# Patient Record
Sex: Male | Born: 1965 | Race: White | Hispanic: No | Marital: Single | Smoking: Never smoker
Health system: Southern US, Community
[De-identification: ages and names within clinical notes are randomized; demographics above are authoritative.]

## PROBLEM LIST (undated history)

## (undated) DIAGNOSIS — S272XXA Traumatic hemopneumothorax, initial encounter: Secondary | ICD-10-CM

## (undated) DIAGNOSIS — E119 Type 2 diabetes mellitus without complications: Secondary | ICD-10-CM

## (undated) DIAGNOSIS — J939 Pneumothorax, unspecified: Secondary | ICD-10-CM

## (undated) DIAGNOSIS — I10 Essential (primary) hypertension: Secondary | ICD-10-CM

## (undated) DIAGNOSIS — J9600 Acute respiratory failure, unspecified whether with hypoxia or hypercapnia: Secondary | ICD-10-CM

## (undated) DIAGNOSIS — W208XXA Other cause of strike by thrown, projected or falling object, initial encounter: Secondary | ICD-10-CM

## (undated) DIAGNOSIS — I4892 Unspecified atrial flutter: Secondary | ICD-10-CM

## (undated) DIAGNOSIS — M961 Postlaminectomy syndrome, not elsewhere classified: Secondary | ICD-10-CM

## (undated) DIAGNOSIS — R011 Cardiac murmur, unspecified: Secondary | ICD-10-CM

## (undated) DIAGNOSIS — J45909 Unspecified asthma, uncomplicated: Secondary | ICD-10-CM

## (undated) DIAGNOSIS — K922 Gastrointestinal hemorrhage, unspecified: Secondary | ICD-10-CM

## (undated) DIAGNOSIS — M109 Gout, unspecified: Secondary | ICD-10-CM

## (undated) DIAGNOSIS — D696 Thrombocytopenia, unspecified: Secondary | ICD-10-CM

## (undated) DIAGNOSIS — G8921 Chronic pain due to trauma: Secondary | ICD-10-CM

## (undated) DIAGNOSIS — Z86718 Personal history of other venous thrombosis and embolism: Secondary | ICD-10-CM

## (undated) DIAGNOSIS — M1990 Unspecified osteoarthritis, unspecified site: Secondary | ICD-10-CM

## (undated) DIAGNOSIS — D62 Acute posthemorrhagic anemia: Secondary | ICD-10-CM

## (undated) DIAGNOSIS — S2241XA Multiple fractures of ribs, right side, initial encounter for closed fracture: Secondary | ICD-10-CM

## (undated) DIAGNOSIS — S2239XA Fracture of one rib, unspecified side, initial encounter for closed fracture: Secondary | ICD-10-CM

## (undated) HISTORY — PX: NOSE SURGERY: SHX723

## (undated) HISTORY — PX: BACK SURGERY: SHX140

## (undated) HISTORY — DX: Gout, unspecified: M10.9

## (undated) HISTORY — DX: Unspecified atrial flutter: I48.92

---

## 1998-02-08 ENCOUNTER — Ambulatory Visit (HOSPITAL_COMMUNITY): Admission: RE | Admit: 1998-02-08 | Discharge: 1998-02-08 | Payer: Self-pay | Admitting: *Deleted

## 1998-02-22 ENCOUNTER — Ambulatory Visit (HOSPITAL_COMMUNITY): Admission: RE | Admit: 1998-02-22 | Discharge: 1998-02-22 | Payer: Self-pay | Admitting: *Deleted

## 1998-02-22 ENCOUNTER — Encounter: Admission: RE | Admit: 1998-02-22 | Discharge: 1998-02-22 | Payer: Self-pay | Admitting: Hematology and Oncology

## 1998-03-17 ENCOUNTER — Encounter: Admission: RE | Admit: 1998-03-17 | Discharge: 1998-03-17 | Payer: Self-pay | Admitting: Internal Medicine

## 1998-05-08 ENCOUNTER — Encounter: Payer: Self-pay | Admitting: *Deleted

## 1998-05-08 ENCOUNTER — Ambulatory Visit (HOSPITAL_COMMUNITY): Admission: RE | Admit: 1998-05-08 | Discharge: 1998-05-08 | Payer: Self-pay | Admitting: *Deleted

## 1998-05-22 ENCOUNTER — Ambulatory Visit (HOSPITAL_COMMUNITY): Admission: RE | Admit: 1998-05-22 | Discharge: 1998-05-22 | Payer: Self-pay | Admitting: *Deleted

## 1998-06-28 ENCOUNTER — Encounter: Admission: RE | Admit: 1998-06-28 | Discharge: 1998-06-28 | Payer: Self-pay | Admitting: Internal Medicine

## 2000-12-18 ENCOUNTER — Emergency Department (HOSPITAL_COMMUNITY): Admission: EM | Admit: 2000-12-18 | Discharge: 2000-12-18 | Payer: Self-pay | Admitting: Emergency Medicine

## 2000-12-18 ENCOUNTER — Encounter: Payer: Self-pay | Admitting: Emergency Medicine

## 2001-12-08 ENCOUNTER — Emergency Department (HOSPITAL_COMMUNITY): Admission: EM | Admit: 2001-12-08 | Discharge: 2001-12-08 | Payer: Self-pay | Admitting: Emergency Medicine

## 2002-02-01 ENCOUNTER — Emergency Department (HOSPITAL_COMMUNITY): Admission: EM | Admit: 2002-02-01 | Discharge: 2002-02-02 | Payer: Self-pay | Admitting: *Deleted

## 2003-06-12 ENCOUNTER — Emergency Department (HOSPITAL_COMMUNITY): Admission: EM | Admit: 2003-06-12 | Discharge: 2003-06-12 | Payer: Self-pay | Admitting: Emergency Medicine

## 2003-06-13 ENCOUNTER — Emergency Department (HOSPITAL_COMMUNITY): Admission: EM | Admit: 2003-06-13 | Discharge: 2003-06-13 | Payer: Self-pay | Admitting: Emergency Medicine

## 2004-10-10 ENCOUNTER — Emergency Department (HOSPITAL_COMMUNITY): Admission: EM | Admit: 2004-10-10 | Discharge: 2004-10-10 | Payer: Self-pay | Admitting: *Deleted

## 2006-09-09 ENCOUNTER — Emergency Department (HOSPITAL_COMMUNITY): Admission: AC | Admit: 2006-09-09 | Discharge: 2006-09-09 | Payer: Self-pay

## 2006-09-11 ENCOUNTER — Emergency Department (HOSPITAL_COMMUNITY): Admission: EM | Admit: 2006-09-11 | Discharge: 2006-09-11 | Payer: Self-pay | Admitting: Emergency Medicine

## 2006-09-17 ENCOUNTER — Ambulatory Visit: Payer: Self-pay | Admitting: Orthopedic Surgery

## 2006-10-07 ENCOUNTER — Emergency Department (HOSPITAL_COMMUNITY): Admission: EM | Admit: 2006-10-07 | Discharge: 2006-10-07 | Payer: Self-pay | Admitting: Emergency Medicine

## 2006-12-01 ENCOUNTER — Ambulatory Visit: Payer: Self-pay | Admitting: Orthopedic Surgery

## 2007-01-31 ENCOUNTER — Emergency Department (HOSPITAL_COMMUNITY): Admission: EM | Admit: 2007-01-31 | Discharge: 2007-01-31 | Payer: Self-pay | Admitting: Emergency Medicine

## 2007-02-02 ENCOUNTER — Emergency Department (HOSPITAL_COMMUNITY): Admission: EM | Admit: 2007-02-02 | Discharge: 2007-02-02 | Payer: Self-pay | Admitting: Emergency Medicine

## 2007-03-22 ENCOUNTER — Emergency Department (HOSPITAL_COMMUNITY): Admission: EM | Admit: 2007-03-22 | Discharge: 2007-03-22 | Payer: Self-pay | Admitting: Emergency Medicine

## 2007-08-13 HISTORY — PX: ESOPHAGOGASTRODUODENOSCOPY: SHX1529

## 2008-01-27 ENCOUNTER — Ambulatory Visit (HOSPITAL_COMMUNITY): Admission: RE | Admit: 2008-01-27 | Discharge: 2008-01-27 | Payer: Self-pay | Admitting: Internal Medicine

## 2008-04-27 ENCOUNTER — Ambulatory Visit: Payer: Self-pay | Admitting: Internal Medicine

## 2008-05-04 ENCOUNTER — Ambulatory Visit (HOSPITAL_COMMUNITY): Admission: RE | Admit: 2008-05-04 | Discharge: 2008-05-04 | Payer: Self-pay | Admitting: Internal Medicine

## 2008-05-04 ENCOUNTER — Ambulatory Visit: Payer: Self-pay | Admitting: Internal Medicine

## 2009-11-06 ENCOUNTER — Inpatient Hospital Stay (HOSPITAL_COMMUNITY): Admission: EM | Admit: 2009-11-06 | Discharge: 2009-11-09 | Payer: Self-pay | Admitting: Emergency Medicine

## 2009-11-07 ENCOUNTER — Encounter (INDEPENDENT_AMBULATORY_CARE_PROVIDER_SITE_OTHER): Payer: Self-pay | Admitting: Cardiology

## 2010-11-04 LAB — GLUCOSE, CAPILLARY
Glucose-Capillary: 125 mg/dL — ABNORMAL HIGH (ref 70–99)
Glucose-Capillary: 138 mg/dL — ABNORMAL HIGH (ref 70–99)
Glucose-Capillary: 160 mg/dL — ABNORMAL HIGH (ref 70–99)
Glucose-Capillary: 236 mg/dL — ABNORMAL HIGH (ref 70–99)
Glucose-Capillary: 237 mg/dL — ABNORMAL HIGH (ref 70–99)
Glucose-Capillary: 263 mg/dL — ABNORMAL HIGH (ref 70–99)

## 2010-11-04 LAB — CARDIAC PANEL(CRET KIN+CKTOT+MB+TROPI)
CK, MB: 2 ng/mL (ref 0.3–4.0)
CK, MB: 2.3 ng/mL (ref 0.3–4.0)
Relative Index: INVALID (ref 0.0–2.5)
Total CK: 67 U/L (ref 7–232)

## 2010-11-04 LAB — CBC
HCT: 36.7 % — ABNORMAL LOW (ref 39.0–52.0)
HCT: 42.2 % (ref 39.0–52.0)
Hemoglobin: 14.9 g/dL (ref 13.0–17.0)
MCHC: 36 g/dL (ref 30.0–36.0)
MCV: 86.2 fL (ref 78.0–100.0)
Platelets: 183 10*3/uL (ref 150–400)
RBC: 4.89 MIL/uL (ref 4.22–5.81)
RDW: 12.6 % (ref 11.5–15.5)
RDW: 12.9 % (ref 11.5–15.5)
WBC: 10.1 10*3/uL (ref 4.0–10.5)

## 2010-11-04 LAB — BASIC METABOLIC PANEL
BUN: 15 mg/dL (ref 6–23)
BUN: 17 mg/dL (ref 6–23)
CO2: 26 mEq/L (ref 19–32)
Calcium: 9.3 mg/dL (ref 8.4–10.5)
GFR calc non Af Amer: >60 mL/min (ref >60)
GFR calc non Af Amer: >60 mL/min (ref >60)
Potassium: 3.7 mEq/L (ref 3.5–5.1)

## 2010-11-04 LAB — TSH: TSH: 2.955 u[IU]/mL (ref 0.350–4.500)

## 2010-11-04 LAB — HEPATIC FUNCTION PANEL
ALT: 20 U/L (ref 0–53)
AST: 19 U/L (ref 0–37)
Albumin: 3.6 g/dL (ref 3.5–5.2)
Bilirubin, Direct: 0.1 mg/dL (ref 0.0–0.3)
Indirect Bilirubin: 0.4 mg/dL (ref 0.3–0.9)
Total Bilirubin: 0.5 mg/dL (ref 0.3–1.2)

## 2010-11-04 LAB — DIFFERENTIAL
Basophils Absolute: 0 10*3/uL (ref 0.0–0.1)
Basophils Relative: 0 % (ref 0–1)
Basophils Relative: 0 % (ref 0–1)
Eosinophils Absolute: 0.1 10*3/uL (ref 0.0–0.7)
Eosinophils Absolute: 0.1 10*3/uL (ref 0.0–0.7)
Lymphocytes Relative: 28 % (ref 12–46)
Lymphocytes Relative: 29 % (ref 12–46)
Lymphs Abs: 2.4 10*3/uL (ref 0.7–4.0)
Monocytes Relative: 9 % (ref 3–12)
Neutro Abs: 5.4 10*3/uL (ref 1.7–7.7)
Neutro Abs: 6.3 10*3/uL (ref 1.7–7.7)

## 2010-11-04 LAB — POCT CARDIAC MARKERS
CKMB, poc: <1 ng/mL — ABNORMAL LOW (ref 1.0–8.0)
Myoglobin, poc: 56.1 ng/mL (ref 12–200)

## 2010-11-04 LAB — HEMOGLOBIN A1C
Hgb A1c MFr Bld: 10.1 % — ABNORMAL HIGH (ref 4.6–6.1)
Mean Plasma Glucose: 243 mg/dL

## 2010-11-04 LAB — PROTIME-INR
INR: 0.97 (ref 0.00–1.49)
Prothrombin Time: 12.8 seconds (ref 11.6–15.2)

## 2010-11-04 LAB — CK TOTAL AND CKMB (NOT AT ARMC): CK, MB: 2.5 ng/mL (ref 0.3–4.0)

## 2010-11-04 LAB — APTT: aPTT: 25 seconds (ref 24–37)

## 2010-11-04 LAB — LIPID PANEL: Triglycerides: 155 mg/dL — ABNORMAL HIGH (ref <150)

## 2010-11-04 LAB — TROPONIN I: Troponin I: <0.01 ng/mL (ref 0.00–0.06)

## 2010-12-25 NOTE — Op Note (Signed)
NAMEPERRIS, CONWELL               ACCOUNT NO.:  [id number]   MEDICAL RECORD NO.:  [id number]          PATIENT TYPE:  AMB   LOCATION:  DAY                           FACILITY:  [facility]   PHYSICIAN:  R. Roetta Sessions, M.D. DATE OF BIRTH:  June 29, 1966   DATE OF PROCEDURE:  05/04/2008  DATE OF DISCHARGE:                               OPERATIVE REPORT   INDICATIONS FOR PROCEDURE:  A 45 year old gentleman with 2 episodes of  hematochezia, went back now three months ago.  He has been having some  right upper quadrant abdominal pain.  He is well.  He has not had any  melena or hematochezia.  He has not had any hematemesis since June 2009.  EGD is now being done.  Risks, benefits, and alternatives have been  reviewed and questions were answered.  He is agreeable.  Please see the  documentation in the medical record.   PROCEDURE NOTE:  O2 saturation, blood pressure, and pulse of the patient  monitored throughout the entire procedure.  Conscious sedation, Versed 5  mg IV and Demerol 100 g IV in divided doses, and Cetacaine spray.   INSTRUMENT:  Pentax video chip system.   FINDINGS:  Examination of the tubular esophagus revealed no mucosal  abnormalities.  EG junction easily traversed and into his stomach.  Gastric cavity was emptied, and insufflated well with air.  Thorough  examination of the gastric mucosa including retroflexed proximal  stomach, esophagogastric junction demonstrated only couple of tiny  prepyloric antral erosions.  The remainder of gastric mucosa appeared  unremarkable.  Pylorus was patent and easily traversed.  Examination of  the bulb and second portion revealed no abnormalities.  Therapeutic/diagnostic maneuvers performed, none.   The patient tolerated the procedure well and was reacted in Endoscopy.   IMPRESSION:  Normal esophagus and a couple of tiny antral erosions of  doubtful clinical significance, otherwise, normal gastric mucosa.  Normal D1 and D2.   RECOMMENDATIONS:  To follow up with the physicians at the [facility].  Should Mr. Hinderman have any future GI complaints, we will be glad to see  him.      Jonathon Bellows, M.D.  Electronically Signed     RMR/MEDQ  D:  05/04/2008  T:  05/05/2008  Job:  [id number]   cc:   Patsy Baltimore

## 2010-12-25 NOTE — Consult Note (Signed)
Jeremy Burgess, Jeremy Burgess               ACCOUNT NO.:  [id number]   MEDICAL RECORD NO.:  [id number]          PATIENT TYPE:  AMB   LOCATION:  DAY                           FACILITY:  [facility]   PHYSICIAN:  R. Roetta Sessions, M.D. DATE OF BIRTH:  [birth year]   DATE OF CONSULTATION:  04/27/2008  DATE OF DISCHARGE:                                 CONSULTATION   REASON FOR CONSULTATION:  Hematemesis.   PHYSICIAN REQUESTING CONSULTATION:  None but the [facility].   HISTORY OF PRESENT ILLNESS:  The patient is a pleasant 45 year old  Caucasian gentleman who presents today for further evaluation of a  single episode of hematemesis which occurred about 2 months ago.  He  states that he had fried fish and hush puppies at one evening for  dinner.  He woke up at 2 a.m. with nausea.  He vomited fresh blood on  the initial episode.  He vomited blood the second time as well.  No  further bleeding was noted.  He described vomiting about half a cup of  blood.  He denies any black stools or bright red blood per rectum.  His  bowel movements are regular.  He does have couple of year history of  intermittent right-sided abdominal pain which interestingly, he states  is better when he takes metformin.  He denies any postprandial component  or any change with his bowel movements.  He denies any unintentional  weight loss.  His asthma has been well controlled.  He notes that around  the time he had the vomiting, he was started on metformin, and he was  taking twice the dose that he was supposed to have been which was  unintentional.  He was taking 2 g twice a day instead of 1 g twice a  day.   CURRENT MEDICATIONS:  1. Metformin 1 g in the morning, 1500 mg in the evening.  2. Lisinopril 5 mg daily.   ALLERGIES:  No known drug allergies.   PAST MEDICAL HISTORY:  Diabetes mellitus, hypertension, and asthma.  He  states he has irregular heart rhythm and recently had a EKG, but he does  not  know the results.  He has had a back surgery.  He has had nasal  surgery.   FAMILY HISTORY:  Mother has diabetes.  No family history of colorectal  cancer.  Father died at age 54 with heart disease.   SOCIAL HISTORY:  He is single.  No children.  He is employed, doing  maintenance work.  He has never been a smoker, consumes about 15 12-  ounce beers every 2-3-day period of time in the weekends.  He denies any  drug use.   REVIEW OF SYMPTOMS:  See HPI for GI.  CONSTITUTIONAL:  No unintentional  weight loss.  CARDIOPULMONARY:  He states that his asthma is well  controlled.  He usually does not have any breathing problems unless he  is around strong odors.  He denies chest pain or palpitations.  He does  have a cold.  He has had some coughing and congestion.  GENITOURINARY:  No dysuria or hematuria.   PHYSICAL EXAMINATION:  VITAL SIGNS:  Weight 237, height 5 feet 11  inches, temp 98.2, blood pressure 138/78, and pulse 72.  GENERAL:  A pleasant, moderately obese Caucasian man, in no acute  distress.  He has a long beard.  HEENT:  Sclerae nonicteric.  Oropharyngeal mucosa moist and pink.  No  lesions, erythema, or exudate.  CHEST:  Lungs are clear to auscultation.  CARDIAC:  Regular rate and rhythm.  Normal S1 and S2.  No murmurs, rubs,  or gallops.  ABDOMEN:  Positive bowel sounds.  Abdomen is soft, nontender, and  nondistended.  No organomegaly or masses.  No rebound or guarding.  No  abdominal bruits or hernias.  LOWER EXTREMITIES:  No edema.   IMPRESSION:  The patient is a 45 year old gentleman with 2 episodes of  hematochezia back in June 2009.  He continues to have intermittent right  upper quadrant abdominal pain of unclear etiology.  Differential  diagnosis regarding hematemesis includes peptic ulcer disease, Mallory-  Weiss tear, ulcerative esophagitis less likely esophageal varices or  malignancy.  Given his initial episode resulted in moderate bloody  emesis, we would  offer him an upper endoscopy for diagnostic purposes  with a differential, cannot exclude alcoholic gastritis/peptic ulcer  disease.   PLAN:  EGD with Dr. Jena Gauss in the near future.   [facility] like to [facility] for allowing [place] to take part in the  care of this patient.      Tana Coast, P.AJonathon Bellows, M.D.  Electronically Signed    LL/MEDQ  D:  04/27/2008  T:  04/28/2008  Job:  [id number]

## 2011-02-21 ENCOUNTER — Other Ambulatory Visit: Payer: Self-pay

## 2011-02-21 ENCOUNTER — Emergency Department (HOSPITAL_COMMUNITY): Payer: Self-pay

## 2011-02-21 ENCOUNTER — Encounter: Payer: Self-pay | Admitting: *Deleted

## 2011-02-21 ENCOUNTER — Emergency Department (HOSPITAL_COMMUNITY)
Admission: EM | Admit: 2011-02-21 | Discharge: 2011-02-21 | Disposition: A | Discharge status: Home / Self Care | Payer: Self-pay | Attending: Emergency Medicine | Admitting: Emergency Medicine

## 2011-02-21 DIAGNOSIS — R079 Chest pain, unspecified: Secondary | ICD-10-CM | POA: Insufficient documentation

## 2011-02-21 DIAGNOSIS — Z859 Personal history of malignant neoplasm, unspecified: Secondary | ICD-10-CM | POA: Insufficient documentation

## 2011-02-21 HISTORY — DX: Essential (primary) hypertension: I10

## 2011-02-21 LAB — BASIC METABOLIC PANEL
Chloride: 101 mEq/L (ref 96–112)
GFR calc Af Amer: >60 mL/min (ref >60)
Potassium: 4.5 mEq/L (ref 3.5–5.1)

## 2011-02-21 LAB — CK TOTAL AND CKMB (NOT AT ARMC)
CK, MB: 2.7 ng/mL (ref 0.3–4.0)
Relative Index: INVALID (ref 0.0–2.5)

## 2011-02-21 LAB — TROPONIN I: Troponin I: <0.3 ng/mL (ref <0.30)

## 2011-02-21 LAB — CBC
Platelets: 211 10*3/uL (ref 150–400)
RDW: 13.1 % (ref 11.5–15.5)
WBC: 10.1 10*3/uL (ref 4.0–10.5)

## 2011-02-21 MED ORDER — CYCLOBENZAPRINE HCL 10 MG PO TABS: 10.0000 mg | ORAL_TABLET | Freq: Two times a day (BID) | ORAL | Status: AC | PRN

## 2011-02-21 MED ORDER — NITROGLYCERIN 0.4 MG SL SUBL
0.4000 mg | SUBLINGUAL_TABLET | Freq: Once | SUBLINGUAL | Status: AC
  Administered 2011-02-21: 0.4 mg via SUBLINGUAL
  Filled 2011-02-21: qty 25

## 2011-02-21 MED ORDER — NAPROXEN 500 MG PO TABS: 500.0000 mg | ORAL_TABLET | Freq: Two times a day (BID) | ORAL | Status: AC

## 2011-02-21 MED ORDER — ASPIRIN 325 MG PO TABS
325.0000 mg | ORAL_TABLET | Freq: Every day | ORAL | Status: DC
  Administered 2011-02-21: 325 mg via ORAL
  Filled 2011-02-21: qty 1

## 2011-02-21 NOTE — ED Notes (Signed)
Lab paged. Blood drawn with IV.

## 2011-02-21 NOTE — ED Provider Notes (Addendum)
History     Chief Complaint  Patient presents with  . Chest Pain   HPI Comments: Pt c/o intermittent muscle spasms in his left chest yesterday; spasms have occurred all morning this AM. Discomfort radiates to left arm; also c/o neck pain from shoulder up to head. +nausea and ha. No f/c, chest pain. Also c/o tingling in hands that is not new. Pt states he was admitted here last year with similar sx, thought related to DM and HTN, stress test at that time was normal but no cath done. Pt h/o DM, reports sugars in the 200s lately; also h/o HTN, ran out of meds and has not taken any in "a while"; father h/o MI at age 100 with CABG x3, passed away at age 31. Non-smoker.   Patient is a 45 y.o. male presenting with chest pain. The history is provided by the patient.  Chest Pain The chest pain began yesterday. Chest pain occurs frequently. The chest pain is unchanged. Associated with: nothing. Quality: no pain, discomfort described as muscle spasm. Chest pain is worsened by certain positions. Pertinent negatives for primary symptoms include no fever, no shortness of breath, no cough, no abdominal pain, no nausea, no vomiting and no dizziness.  Pertinent negatives for associated symptoms include no diaphoresis and no numbness. Risk factors include male gender (DM; HTN; +FHx MI <59 yo; ).     Past Medical History  Diagnosis Date  . Cancer   . Blood transfusion     Past Surgical History  Procedure Date  . Back surgery     No family history on file.  History  Substance Use Topics  . Smoking status: Never Smoker   . Smokeless tobacco: Not on file  . Alcohol Use: No      Review of Systems  Constitutional: Negative for fever and diaphoresis.  Respiratory: Negative for cough and shortness of breath.   Cardiovascular: Positive for chest pain.  Gastrointestinal: Negative for nausea, vomiting and abdominal pain.  Neurological: Negative for dizziness and numbness.  All other systems reviewed  and are negative.    Physical Exam  BP 158/66  Pulse 62  Temp 98.2 F (36.8 C)  Resp 16  Ht 5\' 11"  (1.803 m)  Wt 230 lb (104.327 kg)  BMI 32.08 kg/m2  SpO2 98%  Physical Exam  Nursing note and vitals reviewed. Constitutional: He is oriented to person, place, and time. No distress.       Appearance consistent with age of record  HENT:  Head: Normocephalic and atraumatic.  Right Ear: External ear normal.  Left Ear: External ear normal.  Nose: Nose normal.  Mouth/Throat: Oropharynx is clear and moist.  Eyes: Conjunctivae are normal.  Neck: Neck supple.  Cardiovascular: Normal rate and regular rhythm.  Exam reveals no gallop and no friction rub.   No murmur heard. Pulmonary/Chest: Effort normal and breath sounds normal. He has no wheezes. He has no rhonchi. He has no rales. He exhibits no tenderness.  Abdominal: Soft. There is no tenderness.  Musculoskeletal: Normal range of motion.       Normal appearance of extremities  Neurological: He is alert and oriented to person, place, and time. No sensory deficit.  Skin: No rash noted.       Color normal  Psychiatric: He has a normal mood and affect.    ED Course  Procedures  1200 -- Pt reevaluation: discomfort the same, no change with NTG/ASA. Discussed work up findings and d/c with muscle relaxant and  anti-inflammatory meds, pt understands and agrees with plan.   Written by Enos Fling acting as scribe for Dr. Adriana Simas.  MDM Pt nontoxic appearing, has had this discomfort previously, determined not heart related; Blood work, cardiac markers; chest x-ray; NTG/ASA  I personally performed the services described in this documentation, which was scribed in my presence. The recorded information has been reviewed and considered.  Date: 02/21/2011  Rate:66  Rhythm: normal sinus rhythm  QRS Axis: normal  Intervals: normal  ST/T Wave abnormalities: normal  Conduction Disutrbances:none  Narrative Interpretation:   Old EKG  Reviewed: none available   Donnetta Hutching, MD 02/21/11 1224  Donnetta Hutching, MD 03/22/11 1635

## 2011-02-21 NOTE — ED Notes (Signed)
Pt states squeezing senation to left chest and twitching sensation to left arm. Pt also states head and neck pain to left side. First began yesterday, lasted most of the day. Woke up today with same symptoms and nausea this morning.

## 2011-02-21 NOTE — ED Notes (Signed)
Pt states "no real pain, just squeezing and twitching". Pt states the sensation is still rated at 9 as when he came in but still insists, "no real pain" NAD. VSS.

## 2011-05-13 LAB — GLUCOSE, CAPILLARY: Glucose-Capillary: 135 — ABNORMAL HIGH

## 2011-07-22 ENCOUNTER — Encounter (HOSPITAL_COMMUNITY): Payer: Self-pay | Admitting: *Deleted

## 2011-07-22 ENCOUNTER — Emergency Department (HOSPITAL_COMMUNITY)
Admission: EM | Admit: 2011-07-22 | Discharge: 2011-07-22 | Disposition: A | Discharge status: Home / Self Care | Payer: Self-pay | Attending: Emergency Medicine | Admitting: Emergency Medicine

## 2011-07-22 DIAGNOSIS — T1590XA Foreign body on external eye, part unspecified, unspecified eye, initial encounter: Secondary | ICD-10-CM | POA: Insufficient documentation

## 2011-07-22 DIAGNOSIS — S00251A Superficial foreign body of right eyelid and periocular area, initial encounter: Secondary | ICD-10-CM

## 2011-07-22 DIAGNOSIS — T1510XA Foreign body in conjunctival sac, unspecified eye, initial encounter: Secondary | ICD-10-CM | POA: Insufficient documentation

## 2011-07-22 MED ORDER — TETRACAINE HCL 0.5 % OP SOLN
OPHTHALMIC | Status: AC
  Filled 2011-07-22: qty 2

## 2011-07-22 MED ORDER — FLUORESCEIN SODIUM 1 MG OP STRP
ORAL_STRIP | OPHTHALMIC | Status: AC
  Filled 2011-07-22: qty 1

## 2011-07-22 MED ORDER — CEPHALEXIN 500 MG PO CAPS: 500.0000 mg | ORAL_CAPSULE | Freq: Four times a day (QID) | ORAL | Status: AC

## 2011-07-22 NOTE — ED Provider Notes (Signed)
History     CSN: [id number] Arrival date & time: 07/22/2011  6:20 AM   First MD Initiated Contact with Patient 07/22/11 [phone]      Chief Complaint  Patient presents with  . Foreign Body in Eye    (Consider location/radiation/quality/duration/timing/severity/associated sxs/prior treatment) Patient is a 45 y.o. male presenting with foreign body in eye. The history is provided by the patient.  Foreign Body in Eye   patient reports he was working with corn 2 days ago and reports pieces of corn flew off and went into his right eye.  Since then he has had a constant sense of foreign body in his right upper lateral eye.  Over the last 24 hours as developed some redness of his right upper lid and thus he presents to the ER for evaluation.  He denies changes in his vision.  He denies fevers and chills.  He denies spreading erythema.  He reports ongoing sensation of foreign body in that eye.  Nothing worsens the symptoms.  Nothing improves his symptoms.  Symptoms are constant  Past Medical History  Diagnosis Date  . Diabetes mellitus   . Hypertension     Past Surgical History  Procedure Date  . Back surgery     History reviewed. No pertinent family history.  History  Substance Use Topics  . Smoking status: Never Smoker   . Smokeless tobacco: Not on file  . Alcohol Use: No      Review of Systems  All other systems reviewed and are negative.    Allergies  Bee venom  Home Medications   Current Outpatient Rx  Name Route Sig Dispense Refill  . CEPHALEXIN 500 MG PO CAPS Oral Take 1 capsule (500 mg total) by mouth 4 (four) times daily. 28 capsule 0  . INSULIN GLARGINE 100 UNIT/ML Winfred SOLN Subcutaneous Inject 30 Units into the skin at bedtime.      [name] NAPROXEN 500 MG PO TABS Oral Take 1 tablet (500 mg total) by mouth 2 (two) times daily. 30 tablet 0    BP 155/81  Pulse 66  Temp 98.4 F (36.9 C)  Resp 20  Ht 5\' 10"  (1.778 m)  Wt 240 lb (108.863 kg)  BMI 34.44 kg/m2  SpO2  99%  Physical Exam  Constitutional: He is oriented to person, place, and time. He appears well-developed and well-nourished.  HENT:  Head: Normocephalic.  Eyes: EOM are normal.       Left eye is normal appearing with normal upper lower lids.  The right eye demonstrates erythema and swelling of his right upper lid and a normal right lower lid.  Conjunctiva are normal bilaterally.  Fluroscein  stain was applied to his right eye without evidence of corneal abrasion.  Eversion of the right upper eyelid demonstrates foreign body located in in the lateral aspect which was removed without difficulty an 18-gauge needle after appropriate topical anesthetic of the eye.  Slit-lamp used  Neck: Normal range of motion.  Pulmonary/Chest: Effort normal.  Musculoskeletal: Normal range of motion.  Neurological: He is alert and oriented to person, place, and time.  Psychiatric: He has a normal mood and affect.    ED Course  FOREIGN BODY REMOVAL Date/Time: 07/22/2011 6:56 AM Performed by: Lyanne Co Authorized by: Lyanne Co Consent: Verbal consent obtained. Consent given by: patient Time out: Immediately prior to procedure a "time out" was called to verify the correct patient, procedure, equipment, support staff and site/side marked as required. Body area:  eye Location details: right eyelid Local anesthetic: tetracaine drops Localization method: eyelid eversion Removal mechanism: 18-gauge needle. Eye examined with fluorescein. No fluorescein uptake. 1 objects recovered. Post-procedure assessment: foreign body removed Patient tolerance: Patient tolerated the procedure well with no immediate complications.   (including critical care time)  Labs Reviewed - No data to display No results found.   1. Foreign body of right eyelid       MDM  Foreign body from his right upper lid was removed.  Given the swelling and erythema of his right upper lid the patient was placed on Keflex orally  family given instructions to followup with the ophthalmologist if not improved in 2-3 days.  He's also been instructed to perform warm compresses to his right eye several times a day.  There is no signs of corneal abrasion at this time. He'll return to the ER for worsening symptoms        Lyanne Co, MD 07/22/11 [phone]

## 2011-07-22 NOTE — ED Notes (Signed)
Pt c/o right eye pain.  

## 2014-08-10 ENCOUNTER — Emergency Department (HOSPITAL_COMMUNITY): Payer: Self-pay

## 2014-08-10 ENCOUNTER — Emergency Department (HOSPITAL_COMMUNITY)
Admission: EM | Admit: 2014-08-10 | Discharge: 2014-08-10 | Disposition: A | Discharge status: Home / Self Care | Payer: Self-pay | Attending: Emergency Medicine | Admitting: Emergency Medicine

## 2014-08-10 ENCOUNTER — Encounter (HOSPITAL_COMMUNITY): Payer: Self-pay | Admitting: Emergency Medicine

## 2014-08-10 DIAGNOSIS — Z794 Long term (current) use of insulin: Secondary | ICD-10-CM | POA: Insufficient documentation

## 2014-08-10 DIAGNOSIS — X58XXXA Exposure to other specified factors, initial encounter: Secondary | ICD-10-CM | POA: Insufficient documentation

## 2014-08-10 DIAGNOSIS — Y9389 Activity, other specified: Secondary | ICD-10-CM | POA: Insufficient documentation

## 2014-08-10 DIAGNOSIS — M779 Enthesopathy, unspecified: Secondary | ICD-10-CM

## 2014-08-10 DIAGNOSIS — E119 Type 2 diabetes mellitus without complications: Secondary | ICD-10-CM | POA: Insufficient documentation

## 2014-08-10 DIAGNOSIS — M778 Other enthesopathies, not elsewhere classified: Secondary | ICD-10-CM | POA: Insufficient documentation

## 2014-08-10 DIAGNOSIS — Y9289 Other specified places as the place of occurrence of the external cause: Secondary | ICD-10-CM | POA: Insufficient documentation

## 2014-08-10 DIAGNOSIS — T1490XA Injury, unspecified, initial encounter: Secondary | ICD-10-CM

## 2014-08-10 DIAGNOSIS — S63502A Unspecified sprain of left wrist, initial encounter: Secondary | ICD-10-CM | POA: Insufficient documentation

## 2014-08-10 DIAGNOSIS — Y998 Other external cause status: Secondary | ICD-10-CM | POA: Insufficient documentation

## 2014-08-10 DIAGNOSIS — I10 Essential (primary) hypertension: Secondary | ICD-10-CM | POA: Insufficient documentation

## 2014-08-10 MED ORDER — DICLOFENAC SODIUM 75 MG PO TBEC: 75.0000 mg | DELAYED_RELEASE_TABLET | Freq: Two times a day (BID) | ORAL | Status: DC

## 2014-08-10 MED ORDER — HYDROCODONE-ACETAMINOPHEN 5-325 MG PO TABS: ORAL_TABLET | ORAL | Status: DC

## 2014-08-10 NOTE — ED Notes (Signed)
Pain left wrist, onset 12/26 when pull himself up into a truck, Good radial pulse.

## 2014-08-10 NOTE — ED Notes (Signed)
Patient states he pulled himself up into a truck 5 days ago and felt something pop in his left wrist. Complaining of worsening pain in left wrist.

## 2014-08-10 NOTE — Discharge Instructions (Signed)
Please use the splint for the next 7 days. Use diclofenac 2 times daily with food. Use Norco at bedtime if needed for pain, or every 4 hours if needed for severe pain. This medication may cause drowsiness, please use with caution. Please see Dr. Luna Glasgow for additional evaluation if not improving. Tendinitis  Tendinitis is redness, soreness, and puffiness (inflammation) of the tendons. Tendons are band-like tissues that connect muscle to bone. Tendinitis often happens in the shoulders, heels, or elbows. It might happen if your job involves doing the same motions over and over. HOME CARE  Use a sling or splint as told by your doctor.  Put ice on the injured area.  Put ice in a plastic bag.  Place a towel between your skin and the bag.  Leave the ice on for 15-20 minutes, 03-04 times a day.  Avoid using your injured arm or leg until the pain goes away.  Do gentle exercises only as told by your doctor. Stop exercises if the pain gets worse, unless your doctor tells you otherwise.  Only take medicines as told by your doctor. GET HELP RIGHT AWAY IF:  Your pain and puffiness get worse.  You have new problems, such as loss of feeling (numbness) in the hands. MAKE SURE YOU:  Understand these instructions.  Will watch your condition.  Will get help right away if you are not doing well or get worse. Document Released: 11/08/2010 Document Revised: 10/21/2011 Document Reviewed: 11/08/2010 [facility] Patient Information 2015 [place], [place]. This information is not intended to replace advice given to you by your health care provider. Make sure you discuss any questions you have with your health care provider.  Sprain A sprain happens when the bands of tissue that connect bones and hold joints together (ligaments) stretch too much or tear. HOME CARE  Raise (elevate) the injured area to lessen puffiness (swelling).  Put ice on the injured area 2 times a day for 2-3 days.  Put ice in a  plastic bag.  Place a towel between your skin and the bag.  Leave the ice on for 15 minutes.  Only take medicine as told by your doctor.  Protect your injured area until your pain and stiffness go away.  Do not get your cast or splint wet. Cover your cast or splint with a plastic bag when you shower or take a bath. Do not swim in a pool.  Your doctor may suggest exercises during your recovery to keep from getting stiff. GET HELP RIGHT AWAY IF:   Your cast or splint becomes damaged.  Your pain gets worse. MAKE SURE YOU:   Understand these instructions.  Will watch this condition.  Will get help right away if you are not doing well or get worse. Document Released: 01/15/2008 Document Revised: 05/19/2013 Document Reviewed: 08/10/2011 [facility] Patient Information 2015 [place], [place]. This information is not intended to replace advice given to you by your health care provider. Make sure you discuss any questions you have with your health care provider.

## 2014-08-12 NOTE — ED Provider Notes (Signed)
CSN: [id number]     Arrival date & time 08/10/14  1607 History   First MD Initiated Contact with Patient 08/10/14 1618     Chief Complaint  Patient presents with  . Wrist Pain     (Consider location/radiation/quality/duration/timing/severity/associated sxs/prior Treatment) Patient is a 49 y.o. male presenting with wrist pain. The history is provided by the patient.  Wrist Pain This is a new problem. The current episode started in the past 7 days. The problem occurs intermittently. The problem has been gradually worsening. Associated symptoms include joint swelling. Pertinent negatives include no abdominal pain, arthralgias, chest pain, coughing, neck pain, numbness or weakness. Exacerbated by: bending the wrist or pulling with the left wrist. He has tried ice for the symptoms. The treatment provided no relief.    Past Medical History  Diagnosis Date  . Diabetes mellitus   . Hypertension    Past Surgical History  Procedure Laterality Date  . Back surgery    . Nose surgery     History reviewed. No pertinent family history. History  Substance Use Topics  . Smoking status: Never Smoker   . Smokeless tobacco: Not on file  . Alcohol Use: No    Review of Systems  Constitutional: Negative for activity change.       All ROS Neg except as noted in HPI  Eyes: Negative for photophobia and discharge.  Respiratory: Negative for cough, shortness of breath and wheezing.   Cardiovascular: Negative for chest pain and palpitations.  Gastrointestinal: Negative for abdominal pain and blood in stool.  Genitourinary: Negative for dysuria, frequency and hematuria.  Musculoskeletal: Positive for joint swelling. Negative for back pain, arthralgias and neck pain.  Skin: Negative.   Neurological: Negative for dizziness, seizures, speech difficulty, weakness and numbness.  Psychiatric/Behavioral: Negative for hallucinations and confusion.      Allergies  Bee venom  Home Medications   Prior  to Admission medications   Medication Sig Start Date End Date Taking? Authorizing Provider  diclofenac (VOLTAREN) 75 MG EC tablet Take 1 tablet (75 mg total) by mouth 2 (two) times daily. 08/10/14   Lenox Ahr, PA-C  HYDROcodone-acetaminophen (NORCO/VICODIN) 5-325 MG per tablet 1 at hs or q4h prn pain 08/10/14   Lenox Ahr, PA-C  insulin glargine (LANTUS) 100 UNIT/ML injection Inject 30 Units into the skin at bedtime.      Historical Provider, MD   BP 175/81 mmHg  Pulse 86  Temp(Src) 98.4 F (36.9 C) (Oral)  Resp 18  Ht 5\' 11"  (1.803 m)  Wt 250 lb (113.399 kg)  BMI 34.88 kg/m2  SpO2 100% Physical Exam  Constitutional: He is oriented to person, place, and time. He appears well-developed and well-nourished.  Non-toxic appearance.  HENT:  Head: Normocephalic.  Right Ear: Tympanic membrane and external ear normal.  Left Ear: Tympanic membrane and external ear normal.  Eyes: EOM and lids are normal. Pupils are equal, round, and reactive to light.  Neck: Normal range of motion. Neck supple. Carotid bruit is not present.  Cardiovascular: Normal rate, regular rhythm, normal heart sounds, intact distal pulses and normal pulses.   Pulmonary/Chest: Breath sounds normal. No respiratory distress.  Abdominal: Soft. Bowel sounds are normal. There is no tenderness. There is no guarding.  Musculoskeletal:       Left wrist: He exhibits decreased range of motion, tenderness and crepitus. He exhibits no effusion and no deformity.  Lymphadenopathy:       Head (right side): No submandibular adenopathy present.  Head (left side): No submandibular adenopathy present.    He has no cervical adenopathy.  Neurological: He is alert and oriented to person, place, and time. He has normal strength. No cranial nerve deficit or sensory deficit.  Skin: Skin is warm and dry.  Psychiatric: He has a normal mood and affect. His speech is normal.  Nursing note and vitals reviewed.   ED Course   Procedures (including critical care time) Labs Review Labs Reviewed - No data to display  Imaging Review No results found.   EKG Interpretation None      MDM  Xray of the left wrist is negative for fracture or dislocation. Exam suggest wrist sprain and tendonitis. Pt fitted with wrist splint.  Rx for diclofenac and norco given to the patient.   Final diagnoses:  Wrist sprain, left, initial encounter  Tendonitis    *I have reviewed nursing notes, vital signs, and all appropriate lab and imaging results for this patient.[address] PA-C 08/12/14 2015  Nat Christen, MD 08/16/14 [phone]

## 2014-08-26 ENCOUNTER — Emergency Department (HOSPITAL_COMMUNITY)
Admission: EM | Admit: 2014-08-26 | Discharge: 2014-08-26 | Disposition: A | Discharge status: Home / Self Care | Payer: Self-pay | Attending: Emergency Medicine | Admitting: Emergency Medicine

## 2014-08-26 ENCOUNTER — Encounter (HOSPITAL_COMMUNITY): Payer: Self-pay | Admitting: *Deleted

## 2014-08-26 DIAGNOSIS — I10 Essential (primary) hypertension: Secondary | ICD-10-CM | POA: Insufficient documentation

## 2014-08-26 DIAGNOSIS — E119 Type 2 diabetes mellitus without complications: Secondary | ICD-10-CM | POA: Insufficient documentation

## 2014-08-26 DIAGNOSIS — M25521 Pain in right elbow: Secondary | ICD-10-CM | POA: Insufficient documentation

## 2014-08-26 DIAGNOSIS — M25531 Pain in right wrist: Secondary | ICD-10-CM | POA: Insufficient documentation

## 2014-08-26 DIAGNOSIS — Z87828 Personal history of other (healed) physical injury and trauma: Secondary | ICD-10-CM | POA: Insufficient documentation

## 2014-08-26 NOTE — ED Notes (Addendum)
Pt seen on 08/11/15 diagnosed with sprained rt wrist, pt states his rt elbow is hurting more than his wrist. Pt's BP elevated in triage, 172/78.

## 2015-02-08 ENCOUNTER — Observation Stay (HOSPITAL_COMMUNITY): Payer: MEDICAID

## 2015-02-08 ENCOUNTER — Emergency Department (HOSPITAL_COMMUNITY): Payer: Self-pay

## 2015-02-08 ENCOUNTER — Encounter (HOSPITAL_COMMUNITY): Payer: Self-pay | Admitting: General Surgery

## 2015-02-08 ENCOUNTER — Observation Stay (HOSPITAL_COMMUNITY): Payer: Self-pay

## 2015-02-08 ENCOUNTER — Inpatient Hospital Stay (HOSPITAL_COMMUNITY)
Admission: EM | Admit: 2015-02-08 | Discharge: 2015-02-23 | DRG: 956 | Disposition: A | Discharge status: Rehabilitation Facility | Payer: Self-pay | Attending: Orthopedic Surgery | Admitting: Orthopedic Surgery

## 2015-02-08 ENCOUNTER — Emergency Department (HOSPITAL_COMMUNITY): Payer: MEDICAID

## 2015-02-08 DIAGNOSIS — S72301A Unspecified fracture of shaft of right femur, initial encounter for closed fracture: Principal | ICD-10-CM | POA: Diagnosis present

## 2015-02-08 DIAGNOSIS — S2239XA Fracture of one rib, unspecified side, initial encounter for closed fracture: Secondary | ICD-10-CM

## 2015-02-08 DIAGNOSIS — S2231XA Fracture of one rib, right side, initial encounter for closed fracture: Secondary | ICD-10-CM

## 2015-02-08 DIAGNOSIS — S52201A Unspecified fracture of shaft of right ulna, initial encounter for closed fracture: Secondary | ICD-10-CM | POA: Diagnosis present

## 2015-02-08 DIAGNOSIS — J9600 Acute respiratory failure, unspecified whether with hypoxia or hypercapnia: Secondary | ICD-10-CM | POA: Diagnosis present

## 2015-02-08 DIAGNOSIS — R069 Unspecified abnormalities of breathing: Secondary | ICD-10-CM

## 2015-02-08 DIAGNOSIS — R0602 Shortness of breath: Secondary | ICD-10-CM

## 2015-02-08 DIAGNOSIS — Z419 Encounter for procedure for purposes other than remedying health state, unspecified: Secondary | ICD-10-CM

## 2015-02-08 DIAGNOSIS — R0902 Hypoxemia: Secondary | ICD-10-CM

## 2015-02-08 DIAGNOSIS — E1165 Type 2 diabetes mellitus with hyperglycemia: Secondary | ICD-10-CM | POA: Diagnosis not present

## 2015-02-08 DIAGNOSIS — S5291XA Unspecified fracture of right forearm, initial encounter for closed fracture: Secondary | ICD-10-CM

## 2015-02-08 DIAGNOSIS — T797XXA Traumatic subcutaneous emphysema, initial encounter: Secondary | ICD-10-CM | POA: Diagnosis present

## 2015-02-08 DIAGNOSIS — R579 Shock, unspecified: Secondary | ICD-10-CM | POA: Diagnosis present

## 2015-02-08 DIAGNOSIS — S30811A Abrasion of abdominal wall, initial encounter: Secondary | ICD-10-CM | POA: Diagnosis present

## 2015-02-08 DIAGNOSIS — D62 Acute posthemorrhagic anemia: Secondary | ICD-10-CM | POA: Diagnosis not present

## 2015-02-08 DIAGNOSIS — S82891A Other fracture of right lower leg, initial encounter for closed fracture: Secondary | ICD-10-CM | POA: Diagnosis present

## 2015-02-08 DIAGNOSIS — S2241XA Multiple fractures of ribs, right side, initial encounter for closed fracture: Secondary | ICD-10-CM | POA: Diagnosis present

## 2015-02-08 DIAGNOSIS — W208XXA Other cause of strike by thrown, projected or falling object, initial encounter: Secondary | ICD-10-CM

## 2015-02-08 DIAGNOSIS — S270XXA Traumatic pneumothorax, initial encounter: Secondary | ICD-10-CM

## 2015-02-08 DIAGNOSIS — D696 Thrombocytopenia, unspecified: Secondary | ICD-10-CM | POA: Diagnosis present

## 2015-02-08 DIAGNOSIS — S2249XA Multiple fractures of ribs, unspecified side, initial encounter for closed fracture: Secondary | ICD-10-CM

## 2015-02-08 DIAGNOSIS — S80211A Abrasion, right knee, initial encounter: Secondary | ICD-10-CM | POA: Diagnosis present

## 2015-02-08 DIAGNOSIS — E878 Other disorders of electrolyte and fluid balance, not elsewhere classified: Secondary | ICD-10-CM | POA: Diagnosis not present

## 2015-02-08 DIAGNOSIS — S52231A Displaced oblique fracture of shaft of right ulna, initial encounter for closed fracture: Secondary | ICD-10-CM | POA: Diagnosis present

## 2015-02-08 DIAGNOSIS — N141 Nephropathy induced by other drugs, medicaments and biological substances: Secondary | ICD-10-CM | POA: Diagnosis present

## 2015-02-08 DIAGNOSIS — S99911A Unspecified injury of right ankle, initial encounter: Secondary | ICD-10-CM

## 2015-02-08 DIAGNOSIS — S82871A Displaced pilon fracture of right tibia, initial encounter for closed fracture: Secondary | ICD-10-CM | POA: Diagnosis present

## 2015-02-08 DIAGNOSIS — S272XXA Traumatic hemopneumothorax, initial encounter: Secondary | ICD-10-CM | POA: Diagnosis present

## 2015-02-08 DIAGNOSIS — E872 Acidosis: Secondary | ICD-10-CM | POA: Diagnosis present

## 2015-02-08 DIAGNOSIS — S299XXA Unspecified injury of thorax, initial encounter: Secondary | ICD-10-CM

## 2015-02-08 DIAGNOSIS — S36113A Laceration of liver, unspecified degree, initial encounter: Secondary | ICD-10-CM | POA: Diagnosis present

## 2015-02-08 DIAGNOSIS — S70211A Abrasion, right hip, initial encounter: Secondary | ICD-10-CM | POA: Diagnosis present

## 2015-02-08 DIAGNOSIS — T1490XA Injury, unspecified, initial encounter: Secondary | ICD-10-CM

## 2015-02-08 DIAGNOSIS — Z794 Long term (current) use of insulin: Secondary | ICD-10-CM

## 2015-02-08 DIAGNOSIS — R1900 Intra-abdominal and pelvic swelling, mass and lump, unspecified site: Secondary | ICD-10-CM | POA: Diagnosis not present

## 2015-02-08 DIAGNOSIS — J15211 Pneumonia due to Methicillin susceptible Staphylococcus aureus: Secondary | ICD-10-CM | POA: Diagnosis not present

## 2015-02-08 DIAGNOSIS — S27321A Contusion of lung, unilateral, initial encounter: Secondary | ICD-10-CM | POA: Diagnosis present

## 2015-02-08 DIAGNOSIS — Y95 Nosocomial condition: Secondary | ICD-10-CM | POA: Diagnosis not present

## 2015-02-08 DIAGNOSIS — N179 Acute kidney failure, unspecified: Secondary | ICD-10-CM | POA: Diagnosis not present

## 2015-02-08 DIAGNOSIS — I10 Essential (primary) hypertension: Secondary | ICD-10-CM | POA: Diagnosis present

## 2015-02-08 DIAGNOSIS — Z9689 Presence of other specified functional implants: Secondary | ICD-10-CM

## 2015-02-08 DIAGNOSIS — Z91030 Bee allergy status: Secondary | ICD-10-CM

## 2015-02-08 DIAGNOSIS — J939 Pneumothorax, unspecified: Secondary | ICD-10-CM

## 2015-02-08 DIAGNOSIS — M79642 Pain in left hand: Secondary | ICD-10-CM

## 2015-02-08 DIAGNOSIS — S7291XA Unspecified fracture of right femur, initial encounter for closed fracture: Secondary | ICD-10-CM | POA: Diagnosis present

## 2015-02-08 DIAGNOSIS — E870 Hyperosmolality and hypernatremia: Secondary | ICD-10-CM | POA: Diagnosis not present

## 2015-02-08 DIAGNOSIS — IMO0001 Reserved for inherently not codable concepts without codable children: Secondary | ICD-10-CM

## 2015-02-08 DIAGNOSIS — E119 Type 2 diabetes mellitus without complications: Secondary | ICD-10-CM

## 2015-02-08 DIAGNOSIS — T508X5A Adverse effect of diagnostic agents, initial encounter: Secondary | ICD-10-CM | POA: Diagnosis present

## 2015-02-08 HISTORY — DX: Type 2 diabetes mellitus without complications: E11.9

## 2015-02-08 HISTORY — DX: Unspecified fracture of right femur, initial encounter for closed fracture: S72.91XA

## 2015-02-08 HISTORY — DX: Multiple fractures of ribs, right side, initial encounter for closed fracture: S22.41XA

## 2015-02-08 HISTORY — DX: Unspecified fracture of shaft of right ulna, initial encounter for closed fracture: S52.201A

## 2015-02-08 HISTORY — DX: Other cause of strike by thrown, projected or falling object, initial encounter: W20.8XXA

## 2015-02-08 LAB — CBC
HEMATOCRIT: 38.1 % — AB (ref 39.0–52.0)
Hemoglobin: 13 g/dL (ref 13.0–17.0)
MCH: 28.4 pg (ref 26.0–34.0)
MCHC: 34.1 g/dL (ref 30.0–36.0)
MCV: 83.4 fL (ref 78.0–100.0)
PLATELETS: 309 10*3/uL (ref 150–400)
RBC: 4.57 MIL/uL (ref 4.22–5.81)
RDW: 13.7 % (ref 11.5–15.5)
WBC: 27.4 10*3/uL — ABNORMAL HIGH (ref 4.0–10.5)

## 2015-02-08 LAB — COMPREHENSIVE METABOLIC PANEL
ALBUMIN: 3.6 g/dL (ref 3.5–5.0)
ALK PHOS: 70 U/L (ref 38–126)
ALT: 532 U/L — AB (ref 17–63)
AST: 594 U/L — ABNORMAL HIGH (ref 15–41)
Anion gap: 18 — ABNORMAL HIGH (ref 5–15)
BUN: 24 mg/dL — ABNORMAL HIGH (ref 6–20)
CHLORIDE: 108 mmol/L (ref 101–111)
CO2: 16 mmol/L — ABNORMAL LOW (ref 22–32)
Calcium: 9.2 mg/dL (ref 8.9–10.3)
Creatinine, Ser: 2.03 mg/dL — ABNORMAL HIGH (ref 0.61–1.24)
GFR calc Af Amer: 42 mL/min — ABNORMAL LOW (ref >60)
GFR calc non Af Amer: 36 mL/min — ABNORMAL LOW (ref >60)
Glucose, Bld: 356 mg/dL — ABNORMAL HIGH (ref 65–99)
POTASSIUM: 3.6 mmol/L (ref 3.5–5.1)
Sodium: 142 mmol/L (ref 135–145)
TOTAL PROTEIN: 7.1 g/dL (ref 6.5–8.1)
Total Bilirubin: 0.6 mg/dL (ref 0.3–1.2)

## 2015-02-08 LAB — BLOOD GAS, ARTERIAL
ACID-BASE DEFICIT: 4 mmol/L — AB (ref 0.0–2.0)
Bicarbonate: 21 mEq/L (ref 20.0–24.0)
Drawn by: 129711
FIO2: 0.5 %
LHR: 18 {breaths}/min
MECHVT: 0.62 mL
O2 Saturation: 98.9 %
PATIENT TEMPERATURE: 98.6
PEEP/CPAP: 5 cmH2O
PO2 ART: 200 mmHg — AB (ref 80.0–100.0)
TCO2: 22.3 mmol/L (ref 0–100)
pCO2 arterial: 41.8 mmHg (ref 35.0–45.0)
pH, Arterial: 7.322 — ABNORMAL LOW (ref 7.350–7.450)

## 2015-02-08 LAB — PREPARE RBC (CROSSMATCH)

## 2015-02-08 LAB — I-STAT ARTERIAL BLOOD GAS, ED
Acid-base deficit: 8 mmol/L — ABNORMAL HIGH (ref 0.0–2.0)
Bicarbonate: 18.9 mEq/L — ABNORMAL LOW (ref 20.0–24.0)
O2 Saturation: 100 %
PCO2 ART: 43.3 mmHg (ref 35.0–45.0)
PH ART: 7.249 — AB (ref 7.350–7.450)
PO2 ART: 287 mmHg — AB (ref 80.0–100.0)
TCO2: 20 mmol/L (ref 0–100)

## 2015-02-08 LAB — CBC WITH DIFFERENTIAL/PLATELET
BASOS PCT: 0 % (ref 0–1)
Basophils Absolute: 0 10*3/uL (ref 0.0–0.1)
Basophils Absolute: 0 10*3/uL (ref 0.0–0.1)
Basophils Relative: 0 % (ref 0–1)
EOS PCT: 0 % (ref 0–5)
EOS PCT: 0 % (ref 0–5)
Eosinophils Absolute: 0 10*3/uL (ref 0.0–0.7)
Eosinophils Absolute: 0 10*3/uL (ref 0.0–0.7)
HCT: 29.1 % — ABNORMAL LOW (ref 39.0–52.0)
HEMATOCRIT: 29.7 % — AB (ref 39.0–52.0)
Hemoglobin: 9.9 g/dL — ABNORMAL LOW (ref 13.0–17.0)
Hemoglobin: 9.9 g/dL — ABNORMAL LOW (ref 13.0–17.0)
LYMPHS ABS: 0.9 10*3/uL (ref 0.7–4.0)
LYMPHS PCT: 9 % — AB (ref 12–46)
Lymphocytes Relative: 5 % — ABNORMAL LOW (ref 12–46)
Lymphs Abs: 2 10*3/uL (ref 0.7–4.0)
MCH: 28.3 pg (ref 26.0–34.0)
MCH: 28.4 pg (ref 26.0–34.0)
MCHC: 33.3 g/dL (ref 30.0–36.0)
MCHC: 34 g/dL (ref 30.0–36.0)
MCV: 84.9 fL (ref 78.0–100.0)
MCV: 83.4 fL (ref 78.0–100.0)
MONOS PCT: 8 % (ref 3–12)
Monocytes Absolute: 1.1 10*3/uL — ABNORMAL HIGH (ref 0.1–1.0)
Monocytes Absolute: 1.4 10*3/uL — ABNORMAL HIGH (ref 0.1–1.0)
Monocytes Relative: 5 % (ref 3–12)
NEUTROS ABS: 19.1 10*3/uL — AB (ref 1.7–7.7)
NEUTROS PCT: 86 % — AB (ref 43–77)
NEUTROS PCT: 87 % — AB (ref 43–77)
Neutro Abs: 14.7 10*3/uL — ABNORMAL HIGH (ref 1.7–7.7)
Platelets: 205 10*3/uL (ref 150–400)
Platelets: 156 10*3/uL (ref 150–400)
RBC: 3.5 MIL/uL — AB (ref 4.22–5.81)
RBC: 3.49 MIL/uL — AB (ref 4.22–5.81)
RDW: 13.9 % (ref 11.5–15.5)
RDW: 14.3 % (ref 11.5–15.5)
WBC Morphology: INCREASED
WBC: 22.2 10*3/uL — ABNORMAL HIGH (ref 4.0–10.5)
WBC: 17.1 10*3/uL — AB (ref 4.0–10.5)

## 2015-02-08 LAB — MRSA PCR SCREENING: MRSA by PCR: NEGATIVE

## 2015-02-08 LAB — CDS SEROLOGY

## 2015-02-08 LAB — PROTIME-INR
INR: 1.29 (ref 0.00–1.49)
INR: 1.41 (ref 0.00–1.49)
PROTHROMBIN TIME: 17.3 s — AB (ref 11.6–15.2)
Prothrombin Time: 16.2 seconds — ABNORMAL HIGH (ref 11.6–15.2)

## 2015-02-08 LAB — GLUCOSE, CAPILLARY
Glucose-Capillary: 171 mg/dL — ABNORMAL HIGH (ref 65–99)
Glucose-Capillary: 139 mg/dL — ABNORMAL HIGH (ref 65–99)
Glucose-Capillary: 140 mg/dL — ABNORMAL HIGH (ref 65–99)

## 2015-02-08 LAB — ETHANOL: Alcohol, Ethyl (B): <5 mg/dL (ref <5)

## 2015-02-08 LAB — ABO/RH: ABO/RH(D): A POS

## 2015-02-08 MED ORDER — PANTOPRAZOLE SODIUM 40 MG IV SOLR
40.0000 mg | Freq: Every day | INTRAVENOUS | Status: DC
  Administered 2015-02-09 – 2015-02-16 (×7): 40 mg via INTRAVENOUS
  Filled 2015-02-08 (×11): qty 40

## 2015-02-08 MED ORDER — SODIUM BICARBONATE 8.4 % IV SOLN
50.0000 meq | Freq: Once | INTRAVENOUS | Status: AC
  Administered 2015-02-08: 50 meq via INTRAVENOUS
  Filled 2015-02-08: qty 50

## 2015-02-08 MED ORDER — LIDOCAINE HCL (CARDIAC) 20 MG/ML IV SOLN
INTRAVENOUS | Status: AC
Start: 2015-02-08 — End: 2015-02-08
  Filled 2015-02-08: qty 5

## 2015-02-08 MED ORDER — EPINEPHRINE HCL 0.1 MG/ML IJ SOSY
PREFILLED_SYRINGE | INTRAMUSCULAR | Status: AC | PRN
  Administered 2015-02-08: 0.5 mg via INTRAVENOUS

## 2015-02-08 MED ORDER — FENTANYL CITRATE (PF) 100 MCG/2ML IJ SOLN: 50.0000 ug | Freq: Once | INTRAMUSCULAR | Status: DC

## 2015-02-08 MED ORDER — ROCURONIUM BROMIDE 50 MG/5ML IV SOLN
INTRAVENOUS | Status: AC | PRN
  Administered 2015-02-08: 100 mg via INTRAVENOUS

## 2015-02-08 MED ORDER — PROPOFOL 1000 MG/100ML IV EMUL
5.0000 ug/kg/min | INTRAVENOUS | Status: DC
  Administered 2015-02-08 (×2): 20 ug/kg/min via INTRAVENOUS
  Administered 2015-02-09: 5 ug/kg/min via INTRAVENOUS
  Filled 2015-02-08 (×2): qty 100

## 2015-02-08 MED ORDER — ONDANSETRON HCL 4 MG PO TABS: 4.0000 mg | ORAL_TABLET | Freq: Four times a day (QID) | ORAL | Status: DC | PRN

## 2015-02-08 MED ORDER — INSULIN ASPART 100 UNIT/ML ~~LOC~~ SOLN
0.0000 [IU] | SUBCUTANEOUS | Status: DC
  Administered 2015-02-08 – 2015-02-11 (×11): 3 [IU] via SUBCUTANEOUS
  Administered 2015-02-12: 4 [IU] via SUBCUTANEOUS
  Administered 2015-02-12 (×3): 3 [IU] via SUBCUTANEOUS
  Administered 2015-02-12: 4 [IU] via SUBCUTANEOUS
  Administered 2015-02-12 – 2015-02-13 (×3): 3 [IU] via SUBCUTANEOUS
  Administered 2015-02-13 (×4): 4 [IU] via SUBCUTANEOUS
  Administered 2015-02-14: 3 [IU] via SUBCUTANEOUS
  Administered 2015-02-14: 4 [IU] via SUBCUTANEOUS
  Administered 2015-02-14: 3 [IU] via SUBCUTANEOUS
  Administered 2015-02-14: 4 [IU] via SUBCUTANEOUS
  Administered 2015-02-14: 3 [IU] via SUBCUTANEOUS
  Administered 2015-02-15: 4 [IU] via SUBCUTANEOUS
  Administered 2015-02-15 (×2): 7 [IU] via SUBCUTANEOUS
  Administered 2015-02-15 (×2): 4 [IU] via SUBCUTANEOUS
  Administered 2015-02-15: 7 [IU] via SUBCUTANEOUS
  Administered 2015-02-16: 3 [IU] via SUBCUTANEOUS
  Administered 2015-02-16: 7 [IU] via SUBCUTANEOUS
  Administered 2015-02-16: 4 [IU] via SUBCUTANEOUS
  Administered 2015-02-16: 11 [IU] via SUBCUTANEOUS
  Administered 2015-02-17 – 2015-02-18 (×4): 4 [IU] via SUBCUTANEOUS
  Administered 2015-02-18 (×2): 3 [IU] via SUBCUTANEOUS

## 2015-02-08 MED ORDER — SODIUM CHLORIDE 0.9 % IV BOLUS (SEPSIS): 1000.0000 mL | Freq: Once | INTRAVENOUS | Status: DC

## 2015-02-08 MED ORDER — ALBUMIN HUMAN 5 % IV SOLN
500.0000 mL | Freq: Once | INTRAVENOUS | Status: AC
  Administered 2015-02-08: 500 mL via INTRAVENOUS
  Filled 2015-02-08: qty 500

## 2015-02-08 MED ORDER — PHENYLEPHRINE HCL 10 MG/ML IJ SOLN
100.0000 ug/min | Freq: Once | INTRAVENOUS | Status: AC
  Administered 2015-02-08: 100 ug/min via INTRAVENOUS
  Filled 2015-02-08: qty 1

## 2015-02-08 MED ORDER — SODIUM CHLORIDE 0.9 % IV SOLN: INTRAVENOUS | Status: DC | PRN

## 2015-02-08 MED ORDER — CHLORHEXIDINE GLUCONATE 0.12 % MT SOLN
15.0000 mL | Freq: Two times a day (BID) | OROMUCOSAL | Status: DC
  Administered 2015-02-09 – 2015-02-16 (×14): 15 mL via OROMUCOSAL
  Filled 2015-02-08 (×17): qty 15

## 2015-02-08 MED ORDER — SODIUM CHLORIDE 0.9 % IV SOLN
25.0000 ug/h | INTRAVENOUS | Status: DC
  Administered 2015-02-08: 50 ug/h via INTRAVENOUS
  Administered 2015-02-09 – 2015-02-10 (×3): 200 ug/h via INTRAVENOUS
  Administered 2015-02-11 – 2015-02-13 (×3): 150 ug/h via INTRAVENOUS
  Administered 2015-02-14 (×2): 175 ug/h via INTRAVENOUS
  Administered 2015-02-15: 275 ug/h via INTRAVENOUS
  Administered 2015-02-15: 200 ug/h via INTRAVENOUS
  Filled 2015-02-08 (×14): qty 50

## 2015-02-08 MED ORDER — ONDANSETRON HCL 4 MG/2ML IJ SOLN
4.0000 mg | Freq: Four times a day (QID) | INTRAMUSCULAR | Status: DC | PRN
  Administered 2015-02-09 – 2015-02-14 (×2): 4 mg via INTRAVENOUS
  Filled 2015-02-08: qty 2

## 2015-02-08 MED ORDER — SODIUM CHLORIDE 0.9 % IV SOLN
INTRAVENOUS | Status: DC
  Administered 2015-02-08 – 2015-02-09 (×3): via INTRAVENOUS

## 2015-02-08 MED ORDER — IOHEXOL 300 MG/ML  SOLN
100.0000 mL | Freq: Once | INTRAMUSCULAR | Status: AC | PRN
  Administered 2015-02-08: 100 mL via INTRAVENOUS

## 2015-02-08 MED ORDER — SODIUM BICARBONATE 8.4 % IV SOLN
50.0000 meq | Freq: Once | INTRAVENOUS | Status: AC
  Administered 2015-02-08: 50 meq via INTRAVENOUS

## 2015-02-08 MED ORDER — HYDROCODONE-ACETAMINOPHEN 5-325 MG PO TABS: 1.0000 | ORAL_TABLET | ORAL | Status: DC | PRN

## 2015-02-08 MED ORDER — SUCCINYLCHOLINE CHLORIDE 20 MG/ML IJ SOLN
INTRAMUSCULAR | Status: AC
  Filled 2015-02-08: qty 1

## 2015-02-08 MED ORDER — PROPOFOL 1000 MG/100ML IV EMUL
INTRAVENOUS | Status: AC
  Filled 2015-02-08: qty 100

## 2015-02-08 MED ORDER — SODIUM CHLORIDE 0.9 % IV SOLN
Freq: Once | INTRAVENOUS | Status: AC
  Administered 2015-02-09: 14:00:00 via INTRAVENOUS

## 2015-02-08 MED ORDER — CEFAZOLIN SODIUM-DEXTROSE 2-3 GM-% IV SOLR
2.0000 g | INTRAVENOUS | Status: AC
  Administered 2015-02-09: 2 g via INTRAVENOUS

## 2015-02-08 MED ORDER — PROPOFOL 1000 MG/100ML IV EMUL: 5.0000 ug/kg/min | INTRAVENOUS | Status: DC

## 2015-02-08 MED ORDER — ROCURONIUM BROMIDE 50 MG/5ML IV SOLN
INTRAVENOUS | Status: AC
  Filled 2015-02-08: qty 2

## 2015-02-08 MED ORDER — PANTOPRAZOLE SODIUM 40 MG PO TBEC
40.0000 mg | DELAYED_RELEASE_TABLET | Freq: Every day | ORAL | Status: DC
  Administered 2015-02-17 – 2015-02-21 (×5): 40 mg via ORAL
  Filled 2015-02-08 (×5): qty 1

## 2015-02-08 MED ORDER — MIDAZOLAM HCL 5 MG/5ML IJ SOLN
INTRAMUSCULAR | Status: AC | PRN
  Administered 2015-02-08: 4 mg via INTRAVENOUS

## 2015-02-08 MED ORDER — ETOMIDATE 2 MG/ML IV SOLN
INTRAVENOUS | Status: AC
  Filled 2015-02-08: qty 20

## 2015-02-08 MED ORDER — FENTANYL CITRATE (PF) 100 MCG/2ML IJ SOLN
50.0000 ug | Freq: Once | INTRAMUSCULAR | Status: AC
  Administered 2015-02-08: 50 ug via INTRAVENOUS

## 2015-02-08 MED ORDER — ETOMIDATE 2 MG/ML IV SOLN
INTRAVENOUS | Status: AC | PRN
  Administered 2015-02-08: 20 mg via INTRAVENOUS

## 2015-02-08 MED ORDER — CETYLPYRIDINIUM CHLORIDE 0.05 % MT LIQD
7.0000 mL | Freq: Four times a day (QID) | OROMUCOSAL | Status: DC
  Administered 2015-02-08 – 2015-02-16 (×30): 7 mL via OROMUCOSAL

## 2015-02-08 MED ORDER — SODIUM BICARBONATE 8.4 % IV SOLN
100.0000 meq | Freq: Once | INTRAVENOUS | Status: DC
  Filled 2015-02-08: qty 100

## 2015-02-08 MED ORDER — FENTANYL BOLUS VIA INFUSION
50.0000 ug | INTRAVENOUS | Status: DC | PRN
  Administered 2015-02-14 – 2015-02-15 (×3): 50 ug via INTRAVENOUS
  Filled 2015-02-08: qty 50

## 2015-02-08 MED ORDER — POTASSIUM CHLORIDE IN NACL 20-0.9 MEQ/L-% IV SOLN
INTRAVENOUS | Status: DC
  Administered 2015-02-08: 12:00:00 via INTRAVENOUS
  Filled 2015-02-08 (×2): qty 1000

## 2015-02-08 MED ORDER — SODIUM CHLORIDE 0.9 % IV SOLN
INTRAVENOUS | Status: AC | PRN
  Administered 2015-02-08: 1000 mL via INTRAVENOUS
  Administered 2015-02-08: 2000 mL via INTRAVENOUS

## 2015-02-08 MED ORDER — MIDAZOLAM HCL 2 MG/2ML IJ SOLN
INTRAMUSCULAR | Status: AC
  Filled 2015-02-08: qty 4

## 2015-02-08 MED ORDER — PHENYLEPHRINE 40 MCG/ML (10ML) SYRINGE FOR IV PUSH (FOR BLOOD PRESSURE SUPPORT)
PREFILLED_SYRINGE | INTRAVENOUS | Status: AC
  Filled 2015-02-08: qty 10

## 2015-02-08 MED ORDER — PHENYLEPHRINE HCL 10 MG/ML IJ SOLN: 0.0000 ug/min | INTRAVENOUS | Status: DC

## 2015-02-08 NOTE — ED Provider Notes (Addendum)
CSN: [id number]     Arrival date & time 02/08/15  9798 History   First MD Initiated Contact with Patient 02/08/15 1008     Chief Complaint  Patient presents with  . Trauma     (Consider location/radiation/quality/duration/timing/severity/associated sxs/prior Treatment) HPI 49 y.o. male presents as level 1 trauma after a 1.5 ft diameter tree fell on him while he was cutting it.  No LOC.  Patient states he has pain throughout his right side, most prominent in the chest wall in the right leg.  Level 5 caveat for acuity; leveled trauma Past Medical History  Diagnosis Date  . Diabetes mellitus   . Hypertension    History reviewed. No pertinent past surgical history. No family history on file. History  Substance Use Topics  . Smoking status: Not on file  . Smokeless tobacco: Not on file  . Alcohol Use: Not on file    Review of Systems  Unable to perform ROS: Acuity of condition      Allergies  Bee venom  Home Medications   Prior to Admission medications   Medication Sig Start Date End Date Taking? Authorizing Provider  insulin detemir (LEVEMIR) 100 UNIT/ML injection Inject 10 Units into the skin at bedtime.   Yes Historical Provider, MD  lisinopril (PRINIVIL,ZESTRIL) 10 MG tablet Take 10 mg by mouth daily.   Yes Historical Provider, MD  naproxen sodium (ANAPROX) 220 MG tablet Take 220 mg by mouth daily as needed (for pain).   Yes Historical Provider, MD  sitaGLIPtin-metformin (JANUMET) 50-1000 MG per tablet Take 1 tablet by mouth 2 (two) times daily with a meal.   Yes Historical Provider, MD   BP 92/45 mmHg  Pulse 77  Temp(Src) 98.8 F (37.1 C) (Axillary)  Resp 15  Ht 6\' 2"  (1.88 m)  Wt 244 lb 0.8 oz (110.7 kg)  BMI 31.32 kg/m2  SpO2 100% Physical Exam  Constitutional: He is oriented to person, place, and time. He appears well-developed and well-nourished.  HENT:  Head: Normocephalic and atraumatic.  Eyes: Conjunctivae and EOM are normal.  Neck: Normal range of  motion. Neck supple.  Cardiovascular: Normal rate, regular rhythm and normal heart sounds.   Pulmonary/Chest: Effort normal and breath sounds normal. No respiratory distress.  Abdominal: He exhibits no distension. There is no tenderness. There is no rebound and no guarding.  Musculoskeletal: Normal range of motion.  Neurological: He is alert and oriented to person, place, and time.  Skin: Skin is warm and dry.  Vitals reviewed.   ED Course  INTUBATION Date/Time: 02/08/2015 10:43 AM Performed by: Debby Freiberg Authorized by: Debby Freiberg Consent: The procedure was performed in an emergent situation. Indications: airway protection and  hypoxemia Intubation method: video-assisted Patient status: paralyzed (RSI) Preoxygenation: nonrebreather mask Pretreatment meds: 1/2 amp epi. Sedatives: etomidate Paralytic: rocuronium Laryngoscope size: Mac 3 Tube size: 7.5 mm Tube type: cuffed Number of attempts: 1 Cricoid pressure: no Cords visualized: yes Post-procedure assessment: chest rise and CO2 detector Breath sounds: reduced on left ETT to lip (cm): 25 prior to repositioning at 23. Tube secured with: ETT holder and adhesive tape Chest x-ray interpreted by me, other physician and radiologist. Chest x-ray findings: endotracheal tube too low Tube repositioned: tube repositioned successfully Patient tolerance: Patient tolerated the procedure well with no immediate complications   (including critical care time) Labs Review Labs Reviewed  COMPREHENSIVE METABOLIC PANEL - Abnormal; Notable for the following:    CO2 16 (*)    Glucose, Bld 356 (*)  BUN 24 (*)    Creatinine, Ser 2.03 (*)    AST 594 (*)    ALT 532 (*)    GFR calc non Af Amer 36 (*)    GFR calc Af Amer 42 (*)    Anion gap 18 (*)    All other components within normal limits  CBC - Abnormal; Notable for the following:    WBC 27.4 (*)    HCT 38.1 (*)    All other components within normal limits  PROTIME-INR -  Abnormal; Notable for the following:    Prothrombin Time 16.2 (*)    All other components within normal limits  CBC WITH DIFFERENTIAL/PLATELET - Abnormal; Notable for the following:    WBC 22.2 (*)    RBC 3.50 (*)    Hemoglobin 9.9 (*)    HCT 29.7 (*)    Neutrophils Relative % 86 (*)    Lymphocytes Relative 9 (*)    Neutro Abs 19.1 (*)    Monocytes Absolute 1.1 (*)    All other components within normal limits  BLOOD GAS, ARTERIAL - Abnormal; Notable for the following:    pH, Arterial 7.322 (*)    pO2, Arterial 200 (*)    Acid-base deficit 4.0 (*)    All other components within normal limits  CBC WITH DIFFERENTIAL/PLATELET - Abnormal; Notable for the following:    WBC 17.1 (*)    RBC 3.49 (*)    Hemoglobin 9.9 (*)    HCT 29.1 (*)    Neutrophils Relative % 87 (*)    Neutro Abs 14.7 (*)    Lymphocytes Relative 5 (*)    Monocytes Absolute 1.4 (*)    All other components within normal limits  PROTIME-INR - Abnormal; Notable for the following:    Prothrombin Time 17.3 (*)    All other components within normal limits  GLUCOSE, CAPILLARY - Abnormal; Notable for the following:    Glucose-Capillary 171 (*)    All other components within normal limits  I-STAT ARTERIAL BLOOD GAS, ED - Abnormal; Notable for the following:    pH, Arterial 7.249 (*)    pO2, Arterial 287.0 (*)    Bicarbonate 18.9 (*)    Acid-base deficit 8.0 (*)    All other components within normal limits  MRSA PCR SCREENING  CDS SEROLOGY  ETHANOL  CBC  TYPE AND SCREEN  PREPARE FRESH FROZEN PLASMA  ABO/RH  PREPARE RBC (CROSSMATCH)  PREPARE FRESH FROZEN PLASMA    Imaging Review Dg Forearm Right  02/08/2015   CLINICAL DATA:  Level 1 trauma. Patient fall from tree. RIGHT forearm deformity.  EXAM: RIGHT FOREARM - 2 VIEW  COMPARISON:  None.  FINDINGS: Oblique fracture of the midshaft of the RIGHT ulna. The fracture is mildly displaced, with 1 cortex width posterior displacement evident on the lateral projection.   Spurring is present at the olecranon and radial tuberosity. Small amount of radiopaque dye is present in the volar aspect of the forearm, consistent with a small extravasation for contrast-enhanced CT.  There is deformity of the distal radius with loss of the normal volar tilt. The fracture planes are not visible and there is ulnar positive variance. Together these findings suggest an old healed distal radius fracture.  IMPRESSION: 1. Oblique mildly displaced midshaft RIGHT ulnar fracture. 2. Deformity of the distal radius is favored to represent an old healed fracture. Dedicated wrist radiographs recommended for further assessment as acute fracture planes could be obscured by parallax. 3. Small volume iodinated contrast  extravasation in the volar forearm at the site of IV access.   Electronically Signed   By: Dereck Ligas M.D.   On: 02/08/2015 12:56   Ct Head Wo Contrast  02/08/2015   ADDENDUM REPORT: 02/08/2015 12:06  ADDENDUM: LEFT anterior first rib fracture is present, mildly displaced. This additional finding was identified on chest CT by Dr. Rosario Jacks And discussed with the trauma service.   Electronically Signed   By: Dereck Ligas M.D.   On: 02/08/2015 12:06   02/08/2015   CLINICAL DATA:  Patient struck with tree branch.  Initial encounter.  EXAM: CT HEAD WITHOUT CONTRAST  CT CERVICAL SPINE WITHOUT CONTRAST  TECHNIQUE: Multidetector CT imaging of the head and cervical spine was performed following the standard protocol without intravenous contrast. Multiplanar CT image reconstructions of the cervical spine were also generated.  COMPARISON:  Chest CT today.  FINDINGS: CT HEAD FINDINGS  Calvarium is intact. Globes and orbits appear within normal limits. Endotracheal tube incidentally noted. Mastoid air cells clear. Both middle ears are clear.  No mass lesion, mass effect, midline shift, hydrocephalus, hemorrhage. No territorial ischemia or acute infarction. Bilateral maxillary floor mucosal thickening.  Carious maxillary dentition with erosion of the LEFT medial maxillary incisor.  CT CERVICAL SPINE FINDINGS  Alignment: Mild levoconvex torticollis, likely positional. No dislocation.  Craniocervical junction: Atlantodental degenerative disease. The odontoid is intact. Occipital condyles intact. C1 ring appears normal.  Vertebrae: Cervical vertebral body height is preserved. Negative for fracture.  Paraspinal soft tissues: Soft tissue emphysema is present in the RIGHT neck, tracking cranially from the chest.  Lung apices: The pneumothorax on the chest CT is not visible at the lung apex on the RIGHT. There is pleural thickening at the RIGHT apex, probably related hemothorax. Posterior RIGHT second and third rib fractures are noted.  Mild cervical spine degenerative disease. Disc protrusions are present at C3-C4 and C4-C5. C4-C5 vacuum disc. Central canal grossly appears adequately patent. Carotid atherosclerosis incidentally noted.  IMPRESSION: 1. Negative CT head. 2. No cervical spine fracture or dislocation. 3. Nondisplaced RIGHT posterior second and third rib fractures. 4. Soft tissue emphysema in the RIGHT neck tracking from the chest. 5. Pleural thickening at the RIGHT lung apex, likely representing hemothorax.  Electronically Signed: By: Dereck Ligas M.D. On: 02/08/2015 11:27   Ct Chest W Contrast  02/08/2015   ADDENDUM REPORT: 02/08/2015 12:01  ADDENDUM: Also noted is and left anterior first rib fracture with small amount overlying subcutaneous air. No pneumothorax.   Electronically Signed   By: Rolm Baptise M.D.   On: 02/08/2015 12:01   02/08/2015   CLINICAL DATA:  Tree fell on right side.  Right side pain.  EXAM: CT CHEST, ABDOMEN, AND PELVIS WITH CONTRAST  TECHNIQUE: Multidetector CT imaging of the chest, abdomen and pelvis was performed following the standard protocol during bolus administration of intravenous contrast.  CONTRAST:  171mL OMNIPAQUE IOHEXOL 300 MG/ML  SOLN  COMPARISON:  None.   FINDINGS: CT CHEST FINDINGS  There are multiple displaced right rib fractures involving the second through ninth ribs. Overlapping fragments noted laterally. There is a large right pneumothorax, likely approximately 30-40%. Chest tube is in place within the fissure. There is a small right pleural effusion. Bibasilar dependent atelectasis noted in both lungs. Moderate subcutaneous emphysema throughout the right chest wall.  Heart is normal size. Aorta is normal caliber. No mediastinal, hilar, or axillary adenopathy.  CT ABDOMEN AND PELVIS FINDINGS  Liver, gallbladder, spleen, pancreas, left adrenal and kidneys are  unremarkable. No hydronephrosis. 3 cm nodule within the right adrenal gland. This is presumably and adrenal nodule, but cannot completely exclude adrenal injury and hemorrhage.  Stomach, large and small bowel are unremarkable. There is trace free fluid adjacent to the inferior edge of the liver. No free air. No adenopathy. Aorta and iliac vessels are normal caliber.  Foley catheter is present in the bladder which is decompressed.  No acute bony abnormality or focal bone lesion in the lumbar spine or pelvis. Right There is enlargement of the hip adductors and visualize proximal right quadriceps muscles. This may reflect intramuscular hematomas.  IMPRESSION: Multiple overlapping/displaced right lateral rib fractures involving the second through ninth ribs. Moderate to large size pneumothorax, 30-40%. Right chest tube tip is in the fissure.  Small right pleural effusion.  Right chest wall subcutaneous emphysema.  Bibasilar atelectasis.  Trace free fluid adjacent to the liver edge. Enlargement of the right adrenal gland, presumably 3 cm nodule although hematoma cannot be excluded.  Enlargement of the right proximal quadriceps muscle and hip adductor which could be related to injury and hematoma. This conceivably could be related to asymmetric use/ over use. Recommend clinical correlation.  Electronically  Signed: By: Rolm Baptise M.D. On: 02/08/2015 11:24   Ct Cervical Spine Wo Contrast  02/08/2015   ADDENDUM REPORT: 02/08/2015 12:06  ADDENDUM: LEFT anterior first rib fracture is present, mildly displaced. This additional finding was identified on chest CT by Dr. Rosario Jacks And discussed with the trauma service.   Electronically Signed   By: Dereck Ligas M.D.   On: 02/08/2015 12:06   02/08/2015   CLINICAL DATA:  Patient struck with tree branch.  Initial encounter.  EXAM: CT HEAD WITHOUT CONTRAST  CT CERVICAL SPINE WITHOUT CONTRAST  TECHNIQUE: Multidetector CT imaging of the head and cervical spine was performed following the standard protocol without intravenous contrast. Multiplanar CT image reconstructions of the cervical spine were also generated.  COMPARISON:  Chest CT today.  FINDINGS: CT HEAD FINDINGS  Calvarium is intact. Globes and orbits appear within normal limits. Endotracheal tube incidentally noted. Mastoid air cells clear. Both middle ears are clear.  No mass lesion, mass effect, midline shift, hydrocephalus, hemorrhage. No territorial ischemia or acute infarction. Bilateral maxillary floor mucosal thickening. Carious maxillary dentition with erosion of the LEFT medial maxillary incisor.  CT CERVICAL SPINE FINDINGS  Alignment: Mild levoconvex torticollis, likely positional. No dislocation.  Craniocervical junction: Atlantodental degenerative disease. The odontoid is intact. Occipital condyles intact. C1 ring appears normal.  Vertebrae: Cervical vertebral body height is preserved. Negative for fracture.  Paraspinal soft tissues: Soft tissue emphysema is present in the RIGHT neck, tracking cranially from the chest.  Lung apices: The pneumothorax on the chest CT is not visible at the lung apex on the RIGHT. There is pleural thickening at the RIGHT apex, probably related hemothorax. Posterior RIGHT second and third rib fractures are noted.  Mild cervical spine degenerative disease. Disc protrusions are  present at C3-C4 and C4-C5. C4-C5 vacuum disc. Central canal grossly appears adequately patent. Carotid atherosclerosis incidentally noted.  IMPRESSION: 1. Negative CT head. 2. No cervical spine fracture or dislocation. 3. Nondisplaced RIGHT posterior second and third rib fractures. 4. Soft tissue emphysema in the RIGHT neck tracking from the chest. 5. Pleural thickening at the RIGHT lung apex, likely representing hemothorax.  Electronically Signed: By: Dereck Ligas M.D. On: 02/08/2015 11:27   Ct Abdomen Pelvis W Contrast  02/08/2015   ADDENDUM REPORT: 02/08/2015 12:01  ADDENDUM: Also noted  is and left anterior first rib fracture with small amount overlying subcutaneous air. No pneumothorax.   Electronically Signed   By: Rolm Baptise M.D.   On: 02/08/2015 12:01   02/08/2015   CLINICAL DATA:  Tree fell on right side.  Right side pain.  EXAM: CT CHEST, ABDOMEN, AND PELVIS WITH CONTRAST  TECHNIQUE: Multidetector CT imaging of the chest, abdomen and pelvis was performed following the standard protocol during bolus administration of intravenous contrast.  CONTRAST:  137mL OMNIPAQUE IOHEXOL 300 MG/ML  SOLN  COMPARISON:  None.  FINDINGS: CT CHEST FINDINGS  There are multiple displaced right rib fractures involving the second through ninth ribs. Overlapping fragments noted laterally. There is a large right pneumothorax, likely approximately 30-40%. Chest tube is in place within the fissure. There is a small right pleural effusion. Bibasilar dependent atelectasis noted in both lungs. Moderate subcutaneous emphysema throughout the right chest wall.  Heart is normal size. Aorta is normal caliber. No mediastinal, hilar, or axillary adenopathy.  CT ABDOMEN AND PELVIS FINDINGS  Liver, gallbladder, spleen, pancreas, left adrenal and kidneys are unremarkable. No hydronephrosis. 3 cm nodule within the right adrenal gland. This is presumably and adrenal nodule, but cannot completely exclude adrenal injury and hemorrhage.   Stomach, large and small bowel are unremarkable. There is trace free fluid adjacent to the inferior edge of the liver. No free air. No adenopathy. Aorta and iliac vessels are normal caliber.  Foley catheter is present in the bladder which is decompressed.  No acute bony abnormality or focal bone lesion in the lumbar spine or pelvis. Right There is enlargement of the hip adductors and visualize proximal right quadriceps muscles. This may reflect intramuscular hematomas.  IMPRESSION: Multiple overlapping/displaced right lateral rib fractures involving the second through ninth ribs. Moderate to large size pneumothorax, 30-40%. Right chest tube tip is in the fissure.  Small right pleural effusion.  Right chest wall subcutaneous emphysema.  Bibasilar atelectasis.  Trace free fluid adjacent to the liver edge. Enlargement of the right adrenal gland, presumably 3 cm nodule although hematoma cannot be excluded.  Enlargement of the right proximal quadriceps muscle and hip adductor which could be related to injury and hematoma. This conceivably could be related to asymmetric use/ over use. Recommend clinical correlation.  Electronically Signed: By: Rolm Baptise M.D. On: 02/08/2015 11:24   Dg Pelvis Portable  02/08/2015   CLINICAL DATA:  Pain following trauma  EXAM: PORTABLE PELVIS 1-2 VIEWS  COMPARISON:  None.  FINDINGS: There is no demonstrable fracture or dislocation. Joint spaces appear intact. There is slight lower lumbar levoscoliosis.  IMPRESSION: No demonstrable fracture or dislocation. Joint spaces appear intact.   Electronically Signed   By: Lowella Grip III M.D.   On: 02/08/2015 10:31   Dg Chest Portable 1 View  02/08/2015   CLINICAL DATA:  Trauma with chest tubes placed ; hypoxia  EXAM: PORTABLE CHEST - 1 VIEW  COMPARISON:  Study obtained earlier in the day  FINDINGS: Endotracheal tube tip is 3.5 cm above the carina. Nasogastric tube tip and side port in stomach. There are now 2 chest tubes on the right.  The right lateral pneumothorax is smaller with the apical component nearly completely resolved. Multiple rib fractures, displace, are noted on the right. There is moderate soft tissue air on the right.  There is atelectasis in the right mid lung and left base regions. Lungs elsewhere clear. Heart size and pulmonary vascularity are normal. No adenopathy.  IMPRESSION: Right pneumothorax smaller after  second chest tube placed. No tension component. Tube positions as described. Atelectatic change in the right mid lung persists as does atelectatic change in the left base. No new lung opacity. No change in cardiac silhouette.   Electronically Signed   By: Lowella Grip III M.D.   On: 02/08/2015 12:40   Dg Chest Port 1 View  02/08/2015   CLINICAL DATA:  Evaluate chest tube.  Trauma.  EXAM: PORTABLE CHEST - 1 VIEW  COMPARISON:  CT and plain film of earlier today.  FINDINGS: 1153 hours. Endotracheal tube terminates 3.5 cm above carina. Nasogastric tube extends beyond the inferior aspect of the film. multiple right-sided rib fractures are again identified. The right-sided chest tube projects over the medial right hemi thorax. Similar, approximately 10% right-sided pneumothorax.  No mediastinal shift. Normal heart size. Patchy atelectasis throughout the right lung. Clear left lung. Extensive subcutaneous emphysema about the right chest.  IMPRESSION: Right-sided chest tube remaining in place with no change in approximately 10% pneumothorax.   Electronically Signed   By: Abigail Miyamoto M.D.   On: 02/08/2015 12:18   Dg Chest Port 1 View  02/08/2015   CLINICAL DATA:  Right-sided chest trauma  EXAM: PORTABLE CHEST - 1 VIEW  COMPARISON:  None.  FINDINGS: There is an endotracheal tube 3 cm above the carina.  There is a right pneumothorax measuring 16 mm along the lateral chest measuring approximately 10%. There is hazy airspace disease in the right mid lung which may reflect atelectasis versus contusion. There is no pleural  effusion. The heart size is normal.  There are numerous right lateral acute comminuted rib fractures. There is soft tissue emphysema along the right lateral chest wall.  IMPRESSION: 1. Endotracheal tube with the tip 3 cm above the carina. 2. Right pneumothorax measuring approximately 10%. Critical Value/emergent results were called by telephone at the time of interpretation on 02/08/2015 at 10:33 am to Dr. Debby Freiberg , who verbally acknowledged these results. 3. Multiple acute comminuted right lateral rib fractures.   Electronically Signed   By: Kathreen Devoid   On: 02/08/2015 10:33   Dg Knee Right Port  02/08/2015   CLINICAL DATA:  Trauma, pain  EXAM: PORTABLE RIGHT KNEE - 1-2 VIEW  COMPARISON:  None.  FINDINGS: No acute fracture or dislocation. No significant joint effusion. Tricompartmental small marginal osteophytes. Mild medial femorotibial compartment joint space narrowing.  IMPRESSION: No acute osseous injury of the right knee.   Electronically Signed   By: Kathreen Devoid   On: 02/08/2015 12:15   Dg Tibia/fibula Right Port  02/08/2015   CLINICAL DATA:  Trauma, femur fracture.  EXAM: PORTABLE RIGHT TIBIA AND FIBULA - 2 VIEW  COMPARISON:  None.  FINDINGS: There is a fracture at the base of the right medial malleolus in the distal tibia. Possible posterior malleolar fracture on the lateral view although visualization is suboptimal with overlying the metallic ball ours. Transverse fracture through the lateral multiple, also difficult to visualize, but best seen on the lateral view.  IMPRESSION: Probable trimalleolar fracture. Posterior malleolar fracture suspected but difficult to visualize due to overlying rods.   Electronically Signed   By: Rolm Baptise M.D.   On: 02/08/2015 12:18   Dg Ankle Right Port  02/08/2015   CLINICAL DATA:  Trauma.  RIGHT femur fracture.  EXAM: PORTABLE RIGHT ANKLE - 2 VIEW  COMPARISON:  None.  FINDINGS: Comminuted discussed that pilon fracture of the RIGHT tibia is present.  Mildly displaced fracture the RIGHT lateral  malleolus. Ankle mortise poorly evaluated due to projection. Tibiotalar joint appears located on the lateral view. Immobilization apparatus projects over the leg. Small vessel atherosclerosis is present, commonly seen in diabetics. Technically degraded images due to overlapping support apparatus and projection.  Calcaneus appears intact grossly with calcaneal spurs.  IMPRESSION: Pilon fracture and mildly displaced transverse lateral malleolus fracture.   Electronically Signed   By: Dereck Ligas M.D.   On: 02/08/2015 12:18   Dg Femur Port, Min 2 Views Right  02/08/2015   CLINICAL DATA:  Initial encounter for trauma.  EXAM: RIGHT FEMUR PORTABLE 1 VIEW  COMPARISON:  None.  FINDINGS: Multiple portable views of the femur with suboptimal patient positioning secondary to clinical status. displaced fracture of the proximal to mid femoral shaft with override. posterior and minimal lateral displacement of distal fracture fragment. Mild varus angulation. Override of maximally 6.5 cm. Osteoarthritis involving the patellofemoral articulation.  IMPRESSION: Femur fracture.   Electronically Signed   By: Abigail Miyamoto M.D.   On: 02/08/2015 12:15     EKG Interpretation None     CRITICAL CARE Performed by: Debby Freiberg   Total critical care time: 35  Critical care time was exclusive of separately billable procedures and treating other patients.  Critical care was necessary to treat or prevent imminent or life-threatening deterioration.  Critical care was time spent personally by me on the following activities: development of treatment plan with patient and/or surrogate as well as nursing, discussions with consultants, evaluation of patient's response to treatment, examination of patient, obtaining history from patient or surrogate, ordering and performing treatments and interventions, ordering and review of laboratory studies, ordering and review of radiographic  studies, pulse oximetry and re-evaluation of patient's condition.  MDM   Final diagnoses:  Trauma  Pneumothorax    49 y.o. male with pertinent PMH of DM, HTN presents with R sided pain after a tree fell on him.  Level 1 trauma called.  Vitals normal per EMS, noted paradoxical motion on R chest wall.  On arrival here, primary survey intact, secondary survey as above.  Intubated for airway protection.  CXR with mod ptx on R with flail chest.  FAST by trauma surgery unremarkable.  Given emergent release blood.  Wu with concurrent femur fracture, likely acute blood loss and hemorrhagic shock.  Chest tube placed by trauma surgery.  Traction pin placed at bedside by ortho.  Admitted to trauma   I have reviewed all laboratory and imaging studies if ordered as above  1. Trauma   2. Pneumothorax         Debby Freiberg, MD 02/08/15 [id number]  Debby Freiberg, MD 02/08/15 [id number]

## 2015-02-08 NOTE — ED Notes (Addendum)
Emergency release Blood products started by RN. [id number] ONeg blood

## 2015-02-08 NOTE — ED Notes (Signed)
Returned from CT.

## 2015-02-08 NOTE — ED Notes (Signed)
Dr. Grandville Silos doing FAST exam. Possible Liver Lac.

## 2015-02-08 NOTE — ED Notes (Signed)
Ortho applying traction to pt's right leg

## 2015-02-08 NOTE — H&P (Signed)
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 of right liver edge ? Small liver laceration Hematoma quad/adductor muscles IDDM HTN Elevated creatinine 2.0   Plan: 1.  Admit to trauma, ICU, resuscitation, femur traction 2.  First right chest tube went to fissure, another right CT placed in good placement, foley, 2 lines of IV access, plus left femoral line 3.  NPO, IVF, pain control 4.  Sedation, vent 4.  Dr. Percell Miller wants to place bedside femur skeletal traction pin, plan for OR tomorrow for femur if stabilized 5.  Dr. Nils Pyle pending formal consult, great vessels okay, will likely need rib plating at later date 6.  Given albumin bolus, NS bolus, 4pRBC, 4 FFP 7.  ICU insulin protocol  Critical care time 10:06 - 1:15pm >3hours of CCM time   Nat Christen, [facility] 02/08/2015, 12:36 PM Pager: [phone]

## 2015-02-08 NOTE — Consult Note (Addendum)
ORTHOPAEDIC CONSULTATION  REQUESTING PHYSICIAN: Trauma Md, MD  Chief Complaint: Tree fell on him today  HPI: Jeremy Burgess is a 49 y.o. male who was cutting down a tree when it fell on him. He was intubated in the ED to protect his airway due to his rib and lung injuries. Per the trauma team he did move all extremities prior to intubation and he complained of right leg pain. He has received 5 units of blood vital signs have stabilized  No past medical history on file. No past surgical history on file. History   Social History  . Marital Status: Single    Spouse Name: N/A  . Number of Children: N/A  . Years of Education: N/A   Social History Main Topics  . Smoking status: Not on file  . Smokeless tobacco: Not on file  . Alcohol Use: Not on file  . Drug Use: Not on file  . Sexual Activity: Not on file   Other Topics Concern  . Not on file   Social History Narrative  . No narrative on file   No family history on file. Allergies  Allergen Reactions  . Bee Venom Other (See Comments)    intolerance   Prior to Admission medications   Not on File   Dg Pelvis Portable  02/08/2015   CLINICAL DATA:  Pain following trauma  EXAM: PORTABLE PELVIS 1-2 VIEWS  COMPARISON:  None.  FINDINGS: There is no demonstrable fracture or dislocation. Joint spaces appear intact. There is slight lower lumbar levoscoliosis.  IMPRESSION: No demonstrable fracture or dislocation. Joint spaces appear intact.   Electronically Signed   By: Lowella Grip III M.D.   On: 02/08/2015 10:31   Dg Chest Port 1 View  02/08/2015   CLINICAL DATA:  Right-sided chest trauma  EXAM: PORTABLE CHEST - 1 VIEW  COMPARISON:  None.  FINDINGS: There is an endotracheal tube 3 cm above the carina.  There is a right pneumothorax measuring 16 mm along the lateral chest measuring approximately 10%. There is hazy airspace disease in the right mid lung which may reflect atelectasis versus contusion. There is no pleural  effusion. The heart size is normal.  There are numerous right lateral acute comminuted rib fractures. There is soft tissue emphysema along the right lateral chest wall.  IMPRESSION: 1. Endotracheal tube with the tip 3 cm above the carina. 2. Right pneumothorax measuring approximately 10%. Critical Value/emergent results were called by telephone at the time of interpretation on 02/08/2015 at 10:33 am to Dr. Debby Freiberg , who verbally acknowledged these results. 3. Multiple acute comminuted right lateral rib fractures.   Electronically Signed   By: Kathreen Devoid   On: 02/08/2015 10:33    Positive ROS: All other systems have been reviewed and were otherwise negative with the exception of those mentioned in the HPI and as above.  Labs cbc  Recent Labs  02/08/15 1013  WBC 27.4*  HGB 13.0  HCT 38.1*  PLT 309    Labs inflam No results for input(s): CRP in the last 72 hours.  Invalid input(s): ESR  Labs coag  Recent Labs  02/08/15 1013  INR 1.29    No results for input(s): NA, K, CL, CO2, GLUCOSE, BUN, CREATININE, CALCIUM in the last 72 hours.  Physical Exam: Filed Vitals:   02/08/15 1021  BP: 71/56  Pulse:   Resp: 23   General: intubated and sedated Cardiovascular: No pedal edema Respiratory: No cyanosis, no use of  accessory musculature GI: No organomegaly, abdomen is soft and non-tender Skin: No lesions in the area of chief complaint other than those listed below in MSK exam.  Neurologic: Sensation intact distally Lymphatic: No axillary or cervical lymphadenopathy  MUSCULOSKELETAL:  His right lower extremity he has crepitus at his femur with swelling. His compartments are soft. He has crepitus at his ankle. He has 1+ pulses at his DP.  His left lower extremity is atraumatic with soft compartments and good pulses.  At his right upper extremity he has good pulses soft compartments and crepitus over his ulna  His left upper extremity is atraumatic with soft compartments  and good pulses.  Other extremities are atraumatic with painless ROM and NVI.  Assessment: Right Femur fracture Right pilon Right ulna fracture   Plan: I will place skeletal traction in the R leg We will tentatively plan for IM nail of the femur, plate right ulna and x-fix of the right pilon tomorrow if stable splint Right arm and R ankle for now  Weight Bearing Status: Bedrest No contraindication to chemical dvt px from my standpoint   [name], MD Cell [phone]   02/08/2015 11:07 AM

## 2015-02-08 NOTE — Progress Notes (Signed)
Procedure(s) (LRB): INTRAMEDULLARY (IM) RETROGRADE FEMORAL NAILING (Right) EXTERNAL FIXATION LEG (Right) OPEN REDUCTION INTERNAL FIXATION (ORIF) ULNAR FRACTURE (Right)   Right rib fractures, right pulmonary contusion, right traumatic pneumothorax after blunt trauma to right chest  Patient examined, CT scan of chest and multiple portable chest x-rays all independently reviewed  Subjective: 49 year old diabetic nonsmoker admitted through the ED after one trauma to the right chest after a significant tree branch fell on his right side. He sustained injuries to his right chest, right upper extremity, and right lower extremity. He was having difficulty breathing and level of consciousness was reduced at the time of presentation and he was intubated. CT scan is performed showing right lateral rib fractures, right pneumothorax, right pulmonary contusion, no major vascular injury. A chest tube was placed with non-complete reexpansion of the right lung and a second chest tube was placed with improved reduction and pneumothorax. The patient has received blood products and volume to maintain pressure. He is currently ventilated on 40% FiO2 without pressors  in a sinus rhythm. His EKG on admission showed no ischemic changes but sinus tachycardia. The patient was acidotic on presentation. No significant abdominal injuries are suspected. Stable blood pressure sinus rhythm  Objective: Vital signs in last 24 hours: Temp:  [97.5 F (36.4 C)-101.3 F (38.5 C)] 101.3 F (38.5 C) (06/29 1755) Pulse Rate:  [52-147] 81 (06/29 1800) Cardiac Rhythm:  [-] Normal sinus rhythm (06/29 1600) Resp:  [11-35] 18 (06/29 1800) BP: (51-193)/(19-172) 88/53 mmHg (06/29 1800) SpO2:  [91 %-100 %] 100 % (06/29 1800) Arterial Line BP: (103-127)/(50-62) 126/54 mmHg (06/29 1800) FiO2 (%):  [40 %-100 %] 40 % (06/29 1751) Weight:  [230 lb (104.327 kg)-244 lb 0.8 oz (110.7 kg)] 244 lb 0.8 oz (110.7 kg) (06/29 1300)  Hemodynamic  parameters for last 24 hours:   afebrile sinus rhythm  Intake/Output from previous day:   Intake/Output this shift:       Physical Exam  General: Middle-aged obese Caucasian male sedated intubated and hemodynamically stable HEENT: Normocephalic pupils equal , dentition adequate Neck: Supple without JVD, adenopathy, or bruit . No crepitus of the neck Chest: Coarse breath sounds right greater than left with subcutaneous emphysema over right chest, 2 right chest tubes in place, both without airleak, no significant blood draining from either chest tube., No paradoxical chest motion with ventilation, no deformity palpable with patient lying supine          Cardiovascular: Regular rate and rhythm, no murmur, no gallop, peripheral pulses             palpable in all extremities Abdomen:  Soft, nontender, no palpable mass or organomegaly Extremities: Warm, well-perfused, no clubbing cyanosis edema or tenderness,              no venous stasis changes of the legs Rectal/GU: Deferred Neuro: Patient sedated-not assessable Skin: Clean and dry without rash or ulceration     Lab Results:  Recent Labs  02/08/15 1207 02/08/15 1500  WBC 22.2* 17.1*  HGB 9.9* 9.9*  HCT 29.7* 29.1*  PLT 205 156   BMET:  Recent Labs  02/08/15 1013  NA 142  K 3.6  CL 108  CO2 16*  GLUCOSE 356*  BUN 24*  CREATININE 2.03*  CALCIUM 9.2    PT/INR:  Recent Labs  02/08/15 1500  LABPROT 17.3*  INR 1.41   ABG    Component Value Date/Time   PHART 7.322* 02/08/2015 1435   HCO3 21.0 02/08/2015 1435  TCO2 22.3 02/08/2015 1435   ACIDBASEDEF 4.0* 02/08/2015 1435   O2SAT 98.9 02/08/2015 1435   CBG (last 3)   Recent Labs  02/08/15 1445 02/08/15 1540  GLUCAP 171* 139*    Assessment/Plan: S/P Procedure(s) (LRB): INTRAMEDULLARY (IM) RETROGRADE FEMORAL NAILING (Right) EXTERNAL FIXATION LEG (Right) OPEN REDUCTION INTERNAL FIXATION (ORIF) ULNAR FRACTURE (Right)  Severe blunt injury to right chest  with rib fractures, traumatic pneumothorax, severe right pulmonary contusion. No evidence of great vessel or cardiac injury but will check 2-D echocardiogram tomorrow. Patient may be a candidate for later rib fixation with plating but pulmonary status will probably worsen over the next 2 days with injury plus significant transfusion requirement. We'll follow.      Tharon Aquas Trigt III 02/08/2015

## 2015-02-08 NOTE — Procedures (Signed)
Arterial Catheter Insertion Procedure Note Jeremy Burgess [id number] [birth year]  Procedure: Insertion of Arterial Catheter  Indications: Blood pressure monitoring  Procedure Details Consent: Risks of procedure as well as the alternatives and risks of each were explained to the (patient/caregiver).  Consent for procedure obtained. Time Out: Verified patient identification, verified procedure, site/side was marked, verified correct patient position, special equipment/implants available, medications/allergies/relevent history reviewed, required imaging and test results available.  Performed  Maximum sterile technique was used including antiseptics, cap, gloves, gown, hand hygiene, mask and sheet. Skin prep: Chlorhexidine; local anesthetic administered 20 gauge catheter was inserted into left radial artery using the Seldinger technique.  Evaluation Blood flow good; BP tracing good. Complications: No apparent complications.   Revonda Standard 02/08/2015

## 2015-02-08 NOTE — ED Notes (Signed)
Tech sent to obtain blood products. MD aware of BP

## 2015-02-08 NOTE — ED Notes (Signed)
Pt successfully intubated 

## 2015-02-08 NOTE — ED Notes (Signed)
Pt cutting tree, it fell on his right side. NO LOC. Pt complaining of Right sided pain- deformity to Right leg and hip per EMS. Pt denies neck and back pain for EMS. GCS 15. Pt having unequal chest rise-

## 2015-02-08 NOTE — ED Notes (Signed)
Dr. Hulen Skains placing central line

## 2015-02-08 NOTE — ED Notes (Signed)
Transporting pt to CT.

## 2015-02-08 NOTE — Clinical Social Work Note (Addendum)
CSW and Chaplain responded to Level 1 trauma.  Per EMS, patient mother has been notified.  Chaplain was able to make contact with patient mother to confirm that she is aware of patient accident - patient mother states that patient brother Jeremy Burgess is on his way from [place].  CSW remains available for support as needed.  Barbette Or, [place]  10:31am  Patient mother requests that primary contact be patient brothers, Jeremy Burgess and Jeremy Burgess for medical information.  Patient brothers to relay information to patient mother as she is unable to be present at the hospital.  Contact information updated on patient facesheet.

## 2015-02-08 NOTE — ED Notes (Signed)
Preparing for chest tube placement

## 2015-02-08 NOTE — ED Notes (Signed)
Dr. Hulen Skains at bedside for second chest tube placement

## 2015-02-08 NOTE — Progress Notes (Addendum)
Responded to level One trauma page to provide  support to patient. Patient reported to ED after tree fail on him.  Patient experienced multiple injuries and  is currently intubated. I accompanied EDP to consultation room to speak with family. There are about eight family members presence  supporting patient.  Per EMS, patient mother has been notified. I was able to make contact with patient mother to confirm that she is aware of patient accident - patient mother states that patient brother Kelvyn Schunk is on his way from [place]. Patient is being admitted and schedule to go to 3M03. Facilitated information sharing between staff and family. Supported family emotionally and spiritually. Will follow as needed

## 2015-02-08 NOTE — ED Notes (Signed)
Palpable pulse in right foot.

## 2015-02-08 NOTE — Progress Notes (Signed)
Orthopedic Tech Progress Note Patient Details:  Jeremy Burgess 1966/02/12 [id number]  Musculoskeletal Traction Type of Traction: Hare Traction Traction Location: rle  As ordered by Dr. Judeth Horn; not skeletal traction rn notified  Hildred Priest 02/08/2015, 11:19 AM

## 2015-02-08 NOTE — ED Notes (Signed)
STOP TIME 1313.

## 2015-02-08 NOTE — ED Notes (Signed)
Right sided chest tube placed by Dr. Grandville Silos.

## 2015-02-08 NOTE — Progress Notes (Signed)
Orthopedic Tech Progress Note Patient Details:  Jeremy Burgess 01-03-1966 [id number] Applied fiberglass short arm splint to RUE.  Pulses, motion, sensation intact before and after splinting.  Capillary refill less than 2 seconds before and after splinting.  Applied fiberglass short leg splint to RLE.  Pulses, motion, sensation intact before and after splinting.  Capillary refill less than 2 seconds before and after splinting.  Applied 15 lbs skeletal traction to RLE.  Pulses, sensation, motion intact before and after application. Capillary refill less than 2 seconds before and after application. Ortho Devices Type of Ortho Device: Post (short arm) splint, Post (short leg) splint Ortho Device/Splint Location: RLE, RUE Ortho Device/Splint Interventions: Application   Darrol Poke 02/08/2015, 3:33 PM

## 2015-02-08 NOTE — ED Notes (Signed)
X-ray at bedside

## 2015-02-08 NOTE — ED Notes (Signed)
Second chest tube placement

## 2015-02-08 NOTE — ED Notes (Signed)
First unit of blood completed.

## 2015-02-08 NOTE — ED Notes (Signed)
C-collar placed on pt.

## 2015-02-08 NOTE — ED Notes (Signed)
Second unit of FFP completed

## 2015-02-08 NOTE — Progress Notes (Signed)
Noted urine output a ~20cc/hr, temp 101.8 core temp, BP's running 80/50's  And the need to give FFP with presenting temp. MD acknowledges, order to continue to give FFP, monitor Temp, BP, and urine output. If urine output doesn't improve, can give 500cc bolus one time. Will continue to monitor closely.

## 2015-02-08 NOTE — ED Notes (Signed)
Second unit of FFP initiated. [id number]

## 2015-02-08 NOTE — ED Notes (Signed)
First unit of FFP completed 1040

## 2015-02-08 NOTE — ED Notes (Signed)
Ortho MD at bedside.

## 2015-02-08 NOTE — ED Notes (Signed)
Updated RN on 38M about pt's status.

## 2015-02-08 NOTE — ED Notes (Signed)
Ortho MD at bedside to place traction pin

## 2015-02-08 NOTE — ED Notes (Signed)
Second unit of blood hanging. O Negative. [id number]

## 2015-02-08 NOTE — Procedures (Signed)
Chest Tube Insertion Procedure Note  Pre-operative Diagnosis: R pneumothorax  Post-operative Diagnosis: R pneumothorax  Procedure Details  Emergency consent was obtained  After sterile skin prep, using standard technique, a 28 French tube was placed in the right anterior axillary line, nipple level  Findings: Rush of air  Estimated Blood Loss:  less than 50 mL         Specimens:  None              Complications:  None; patient tolerated the procedure well.         Disposition: trauma bay         Condition: unstable  Georganna Skeans, MD, MPH, FACS Trauma: [phone] General Surgery: [phone]

## 2015-02-08 NOTE — Progress Notes (Signed)
Procedure: After appropriate time out I used sterile technique to place a 10mm smooth skeletal traction pin in his Right proximal tibia. A sterile dressing was applied  15lbs of traction was applied  He tolerated  This well     Jeremy Burgess

## 2015-02-08 NOTE — ED Notes (Signed)
FFP started. [id number]

## 2015-02-08 NOTE — Procedures (Signed)
FAST  Pre-procedure diagnosis:tree fell on him Post-procedure diagnosis:tiny free fluid next to liver Procedure: FAST Surgeon: Georganna Skeans, MD Procedure in detail: The patient's abdomen was imaged in 4 regions with the ultrasound. First, the right upper quadrant was imaged. No free fluid was seen between the right kidney and the liver in Morison's pouch. This was repeated 71min later and a tiny amount of fluid was seen there. Next, the epigastrium was imaged. No significant pericardial effusion was seen with a poor view. Next, the left upper quadrant was imaged. No free fluid was seen between the left kidney and the spleen. Finally, the bladder was imaged. No free fluid was seen next to the bladder in the pelvis. Impression: possible liver laceration  Georganna Skeans, MD, MPH, FACS Trauma: [phone] General Surgery: [phone]

## 2015-02-08 NOTE — ED Notes (Signed)
Second unit of PRBC completed.

## 2015-02-08 NOTE — ED Notes (Signed)
Ortho consult

## 2015-02-08 NOTE — ED Notes (Signed)
Third unit of PRBC initiated.

## 2015-02-09 ENCOUNTER — Inpatient Hospital Stay (HOSPITAL_COMMUNITY): Payer: Self-pay | Admitting: Anesthesiology

## 2015-02-09 ENCOUNTER — Inpatient Hospital Stay (HOSPITAL_COMMUNITY): Payer: Self-pay

## 2015-02-09 ENCOUNTER — Encounter (HOSPITAL_COMMUNITY): Admission: EM | Disposition: A | Discharge status: Rehabilitation Facility | Payer: Self-pay | Source: Home / Self Care

## 2015-02-09 ENCOUNTER — Inpatient Hospital Stay (HOSPITAL_COMMUNITY): Payer: MEDICAID

## 2015-02-09 ENCOUNTER — Observation Stay (HOSPITAL_COMMUNITY): Payer: Self-pay

## 2015-02-09 ENCOUNTER — Inpatient Hospital Stay (HOSPITAL_COMMUNITY): Payer: MEDICAID | Admitting: Anesthesiology

## 2015-02-09 ENCOUNTER — Observation Stay (HOSPITAL_COMMUNITY): Payer: MEDICAID

## 2015-02-09 DIAGNOSIS — S272XXA Traumatic hemopneumothorax, initial encounter: Secondary | ICD-10-CM | POA: Diagnosis present

## 2015-02-09 DIAGNOSIS — N179 Acute kidney failure, unspecified: Secondary | ICD-10-CM | POA: Diagnosis present

## 2015-02-09 DIAGNOSIS — J9600 Acute respiratory failure, unspecified whether with hypoxia or hypercapnia: Secondary | ICD-10-CM

## 2015-02-09 DIAGNOSIS — S82891A Other fracture of right lower leg, initial encounter for closed fracture: Secondary | ICD-10-CM | POA: Diagnosis present

## 2015-02-09 DIAGNOSIS — D696 Thrombocytopenia, unspecified: Secondary | ICD-10-CM | POA: Diagnosis not present

## 2015-02-09 DIAGNOSIS — D62 Acute posthemorrhagic anemia: Secondary | ICD-10-CM

## 2015-02-09 DIAGNOSIS — I10 Essential (primary) hypertension: Secondary | ICD-10-CM | POA: Insufficient documentation

## 2015-02-09 HISTORY — DX: Other fracture of right lower leg, initial encounter for closed fracture: S82.891A

## 2015-02-09 HISTORY — PX: EXTERNAL FIXATION LEG: SHX1549

## 2015-02-09 HISTORY — DX: Acute posthemorrhagic anemia: D62

## 2015-02-09 HISTORY — PX: FEMUR IM NAIL: SHX1597

## 2015-02-09 HISTORY — PX: ORIF ULNAR FRACTURE: SHX5417

## 2015-02-09 HISTORY — DX: Acute respiratory failure, unspecified whether with hypoxia or hypercapnia: J96.00

## 2015-02-09 HISTORY — DX: Traumatic hemopneumothorax, initial encounter: S27.2XXA

## 2015-02-09 HISTORY — DX: Thrombocytopenia, unspecified: D69.6

## 2015-02-09 LAB — PREPARE FRESH FROZEN PLASMA
UNIT DIVISION: 0
UNIT DIVISION: 0
UNIT DIVISION: 0
Unit division: 0
Unit division: 0
Unit division: 0
Unit division: 0
Unit division: 0

## 2015-02-09 LAB — BASIC METABOLIC PANEL
Anion gap: 11 (ref 5–15)
Anion gap: 7 (ref 5–15)
BUN: 37 mg/dL — AB (ref 6–20)
BUN: 39 mg/dL — AB (ref 6–20)
CO2: 19 mmol/L — AB (ref 22–32)
CO2: 22 mmol/L (ref 22–32)
CREATININE: 2.97 mg/dL — AB (ref 0.61–1.24)
Calcium: 7.3 mg/dL — ABNORMAL LOW (ref 8.9–10.3)
Calcium: 7.2 mg/dL — ABNORMAL LOW (ref 8.9–10.3)
Chloride: 114 mmol/L — ABNORMAL HIGH (ref 101–111)
Chloride: 118 mmol/L — ABNORMAL HIGH (ref 101–111)
Creatinine, Ser: 2.55 mg/dL — ABNORMAL HIGH (ref 0.61–1.24)
GFR calc Af Amer: 27 mL/min — ABNORMAL LOW (ref >60)
GFR calc Af Amer: 32 mL/min — ABNORMAL LOW (ref >60)
GFR, EST NON AFRICAN AMERICAN: 23 mL/min — AB (ref >60)
GFR, EST NON AFRICAN AMERICAN: 28 mL/min — AB (ref >60)
GLUCOSE: 136 mg/dL — AB (ref 65–99)
Glucose, Bld: 134 mg/dL — ABNORMAL HIGH (ref 65–99)
Potassium: 3.9 mmol/L (ref 3.5–5.1)
Potassium: 3.9 mmol/L (ref 3.5–5.1)
SODIUM: 147 mmol/L — AB (ref 135–145)
Sodium: 144 mmol/L (ref 135–145)

## 2015-02-09 LAB — CBC
HCT: 22.4 % — ABNORMAL LOW (ref 39.0–52.0)
HEMATOCRIT: 23.8 % — AB (ref 39.0–52.0)
HEMATOCRIT: 24.5 % — AB (ref 39.0–52.0)
HEMATOCRIT: 28.3 % — AB (ref 39.0–52.0)
Hemoglobin: 8.2 g/dL — ABNORMAL LOW (ref 13.0–17.0)
Hemoglobin: 7.7 g/dL — ABNORMAL LOW (ref 13.0–17.0)
Hemoglobin: 8.4 g/dL — ABNORMAL LOW (ref 13.0–17.0)
Hemoglobin: 9.6 g/dL — ABNORMAL LOW (ref 13.0–17.0)
MCH: 28 pg (ref 26.0–34.0)
MCH: 28.1 pg (ref 26.0–34.0)
MCH: 27.6 pg (ref 26.0–34.0)
MCH: 27.7 pg (ref 26.0–34.0)
MCHC: 34.5 g/dL (ref 30.0–36.0)
MCHC: 34.3 g/dL (ref 30.0–36.0)
MCHC: 34.4 g/dL (ref 30.0–36.0)
MCHC: 33.9 g/dL (ref 30.0–36.0)
MCV: 81.2 fL (ref 78.0–100.0)
MCV: 81.8 fL (ref 78.0–100.0)
MCV: 80.6 fL (ref 78.0–100.0)
MCV: 81.8 fL (ref 78.0–100.0)
PLATELETS: 97 10*3/uL — AB (ref 150–400)
Platelets: 119 10*3/uL — ABNORMAL LOW (ref 150–400)
Platelets: 105 10*3/uL — ABNORMAL LOW (ref 150–400)
Platelets: 111 10*3/uL — ABNORMAL LOW (ref 150–400)
RBC: 2.74 MIL/uL — AB (ref 4.22–5.81)
RBC: 2.93 MIL/uL — AB (ref 4.22–5.81)
RBC: 3.04 MIL/uL — AB (ref 4.22–5.81)
RBC: 3.46 MIL/uL — AB (ref 4.22–5.81)
RDW: 14.4 % (ref 11.5–15.5)
RDW: 14.4 % (ref 11.5–15.5)
RDW: 14.5 % (ref 11.5–15.5)
RDW: 14.8 % (ref 11.5–15.5)
WBC: 7.5 10*3/uL (ref 4.0–10.5)
WBC: 7.5 10*3/uL (ref 4.0–10.5)
WBC: 8.6 10*3/uL (ref 4.0–10.5)
WBC: 8.6 10*3/uL (ref 4.0–10.5)

## 2015-02-09 LAB — GLUCOSE, CAPILLARY
Glucose-Capillary: 148 mg/dL — ABNORMAL HIGH (ref 65–99)
Glucose-Capillary: 123 mg/dL — ABNORMAL HIGH (ref 65–99)
Glucose-Capillary: 117 mg/dL — ABNORMAL HIGH (ref 65–99)
Glucose-Capillary: 115 mg/dL — ABNORMAL HIGH (ref 65–99)
Glucose-Capillary: 97 mg/dL (ref 65–99)

## 2015-02-09 LAB — POCT I-STAT 4, (NA,K, GLUC, HGB,HCT)
Glucose, Bld: 122 mg/dL — ABNORMAL HIGH (ref 65–99)
Glucose, Bld: 134 mg/dL — ABNORMAL HIGH (ref 65–99)
HCT: 21 % — ABNORMAL LOW (ref 39.0–52.0)
HEMATOCRIT: 26 % — AB (ref 39.0–52.0)
Hemoglobin: 7.1 g/dL — ABNORMAL LOW (ref 13.0–17.0)
Hemoglobin: 8.8 g/dL — ABNORMAL LOW (ref 13.0–17.0)
POTASSIUM: 3.7 mmol/L (ref 3.5–5.1)
POTASSIUM: 3.9 mmol/L (ref 3.5–5.1)
SODIUM: 147 mmol/L — AB (ref 135–145)
Sodium: 146 mmol/L — ABNORMAL HIGH (ref 135–145)

## 2015-02-09 LAB — PROTIME-INR
INR: 1.31 (ref 0.00–1.49)
Prothrombin Time: 16.4 seconds — ABNORMAL HIGH (ref 11.6–15.2)

## 2015-02-09 LAB — PREPARE RBC (CROSSMATCH)

## 2015-02-09 LAB — BLOOD PRODUCT ORDER (VERBAL) VERIFICATION

## 2015-02-09 SURGERY — INSERTION, INTRAMEDULLARY ROD, FEMUR, RETROGRADE
Anesthesia: General | Laterality: Right

## 2015-02-09 MED ORDER — VECURONIUM BROMIDE 10 MG IV SOLR
INTRAVENOUS | Status: AC
  Filled 2015-02-09: qty 10

## 2015-02-09 MED ORDER — SODIUM CHLORIDE 0.9 % IV SOLN: Freq: Once | INTRAVENOUS | Status: DC

## 2015-02-09 MED ORDER — SODIUM CHLORIDE 0.9 % IV SOLN
2500.0000 ug | INTRAVENOUS | Status: DC | PRN
  Administered 2015-02-09: 200 ug/h via INTRAVENOUS

## 2015-02-09 MED ORDER — CEFAZOLIN SODIUM-DEXTROSE 2-3 GM-% IV SOLR
2.0000 g | Freq: Four times a day (QID) | INTRAVENOUS | Status: AC
  Administered 2015-02-09 – 2015-02-10 (×3): 2 g via INTRAVENOUS
  Filled 2015-02-09 (×3): qty 50

## 2015-02-09 MED ORDER — FENTANYL CITRATE (PF) 250 MCG/5ML IJ SOLN
INTRAMUSCULAR | Status: AC
  Filled 2015-02-09: qty 5

## 2015-02-09 MED ORDER — CITRIC ACID-SODIUM CITRATE 334-500 MG/5ML PO SOLN
30.0000 mL | Freq: Two times a day (BID) | ORAL | Status: DC
  Administered 2015-02-09 – 2015-02-10 (×2): 30 mL
  Filled 2015-02-09 (×5): qty 30

## 2015-02-09 MED ORDER — MIDAZOLAM HCL 2 MG/2ML IJ SOLN
INTRAMUSCULAR | Status: AC
  Filled 2015-02-09: qty 2

## 2015-02-09 MED ORDER — FENTANYL CITRATE (PF) 100 MCG/2ML IJ SOLN
INTRAMUSCULAR | Status: DC | PRN
  Administered 2015-02-09: 50 ug via INTRAVENOUS

## 2015-02-09 MED ORDER — PROPOFOL 10 MG/ML IV BOLUS
INTRAVENOUS | Status: DC | PRN
  Administered 2015-02-09: 20 mg via INTRAVENOUS

## 2015-02-09 MED ORDER — LACTATED RINGERS IV SOLN
INTRAVENOUS | Status: DC | PRN
  Administered 2015-02-09 (×2): via INTRAVENOUS

## 2015-02-09 MED ORDER — VECURONIUM BROMIDE 10 MG IV SOLR
INTRAVENOUS | Status: DC | PRN
  Administered 2015-02-09 (×3): 5 mg via INTRAVENOUS

## 2015-02-09 MED ORDER — CEFAZOLIN SODIUM-DEXTROSE 2-3 GM-% IV SOLR
INTRAVENOUS | Status: AC
  Filled 2015-02-09: qty 50

## 2015-02-09 MED ORDER — MIDAZOLAM HCL 5 MG/5ML IJ SOLN
INTRAMUSCULAR | Status: DC | PRN
  Administered 2015-02-09: 2 mg via INTRAVENOUS

## 2015-02-09 MED ORDER — PHENYLEPHRINE HCL 10 MG/ML IJ SOLN
10.0000 mg | INTRAMUSCULAR | Status: DC | PRN
  Administered 2015-02-09: 25 ug/min via INTRAVENOUS

## 2015-02-09 MED ORDER — PHENYLEPHRINE HCL 10 MG/ML IJ SOLN
INTRAMUSCULAR | Status: DC | PRN
  Administered 2015-02-09: 120 ug via INTRAVENOUS

## 2015-02-09 MED ORDER — 0.9 % SODIUM CHLORIDE (POUR BTL) OPTIME
TOPICAL | Status: DC | PRN
  Administered 2015-02-09: 1000 mL

## 2015-02-09 MED ORDER — STERILE WATER FOR INJECTION IJ SOLN
INTRAMUSCULAR | Status: AC
  Filled 2015-02-09: qty 10

## 2015-02-09 SURGICAL SUPPLY — 123 items
7-hole 90mm plate with 4 locking holes (Plate) ×2 IMPLANT
APL SKNCLS STERI-STRIP NONHPOA (GAUZE/BANDAGES/DRESSINGS) ×1
BANDAGE ELASTIC 3 VELCRO ST LF (GAUZE/BANDAGES/DRESSINGS) ×3 IMPLANT
BANDAGE ELASTIC 4 VELCRO ST LF (GAUZE/BANDAGES/DRESSINGS) ×5 IMPLANT
BANDAGE ELASTIC 6 VELCRO ST LF (GAUZE/BANDAGES/DRESSINGS) ×3 IMPLANT
BANDAGE ESMARK 6X9 LF (GAUZE/BANDAGES/DRESSINGS) ×1 IMPLANT
BENZOIN TINCTURE PRP APPL 2/3 (GAUZE/BANDAGES/DRESSINGS) ×3 IMPLANT
BIT DRILL 2.6 (BIT) ×2 IMPLANT
BIT DRILL 3.5MM (BIT) ×1
BIT DRILL AO GAMMA 4.2X130 (BIT) ×2 IMPLANT
BIT DRILL AO GAMMA 4.2X340 (BIT) ×2 IMPLANT
BIT DRILL OVERDRILL 3.5X122 (BIT) ×1 IMPLANT
BLADE SURG 10 STRL SS (BLADE) ×2 IMPLANT
BLADE SURG 15 STRL LF DISP TIS (BLADE) IMPLANT
BLADE SURG 15 STRL SS (BLADE) ×3
BLADE SURG ROTATE 9660 (MISCELLANEOUS) IMPLANT
BNDG CMPR 9X4 STRL LF SNTH (GAUZE/BANDAGES/DRESSINGS) ×1
BNDG CMPR 9X6 STRL LF SNTH (GAUZE/BANDAGES/DRESSINGS) ×1
BNDG COHESIVE 4X5 TAN STRL (GAUZE/BANDAGES/DRESSINGS) ×3 IMPLANT
BNDG COHESIVE 6X5 TAN STRL LF (GAUZE/BANDAGES/DRESSINGS) ×5 IMPLANT
BNDG ESMARK 4X9 LF (GAUZE/BANDAGES/DRESSINGS) ×3 IMPLANT
BNDG ESMARK 6X9 LF (GAUZE/BANDAGES/DRESSINGS) ×3
BNDG GAUZE ELAST 4 BULKY (GAUZE/BANDAGES/DRESSINGS) ×6 IMPLANT
CLEANER TIP ELECTROSURG 2X2 (MISCELLANEOUS) ×3 IMPLANT
CLOSURE WOUND 1/2 X4 (GAUZE/BANDAGES/DRESSINGS) ×1
CORDS BIPOLAR (ELECTRODE) ×3 IMPLANT
COVER SURGICAL LIGHT HANDLE (MISCELLANEOUS) ×6 IMPLANT
CUFF TOURNIQUET SINGLE 18IN (TOURNIQUET CUFF) ×3 IMPLANT
CUFF TOURNIQUET SINGLE 24IN (TOURNIQUET CUFF) IMPLANT
CUFF TOURNIQUET SINGLE 34IN LL (TOURNIQUET CUFF) IMPLANT
DECANTER SPIKE VIAL GLASS SM (MISCELLANEOUS) IMPLANT
DRAPE C-ARM 42X72 X-RAY (DRAPES) ×3 IMPLANT
DRAPE IMP U-DRAPE 54X76 (DRAPES) ×3 IMPLANT
DRAPE OEC MINIVIEW 54X84 (DRAPES) IMPLANT
DRAPE ORTHO SPLIT 77X108 STRL (DRAPES) ×9
DRAPE PROXIMA HALF (DRAPES) ×6 IMPLANT
DRAPE SURG ORHT 6 SPLT 77X108 (DRAPES) ×2 IMPLANT
DRAPE U-SHAPE 47X51 STRL (DRAPES) ×3 IMPLANT
DRSG ADAPTIC 3X8 NADH LF (GAUZE/BANDAGES/DRESSINGS) ×5 IMPLANT
DRSG MEPILEX BORDER 4X4 (GAUZE/BANDAGES/DRESSINGS) ×3 IMPLANT
DRSG MEPILEX BORDER 4X8 (GAUZE/BANDAGES/DRESSINGS) ×3 IMPLANT
DRSG PAD ABDOMINAL 8X10 ST (GAUZE/BANDAGES/DRESSINGS) ×4 IMPLANT
DURAPREP 26ML APPLICATOR (WOUND CARE) ×3 IMPLANT
ELECT REM PT RETURN 9FT ADLT (ELECTROSURGICAL) ×3
ELECTRODE REM PT RTRN 9FT ADLT (ELECTROSURGICAL) ×1 IMPLANT
EVACUATOR 1/8 PVC DRAIN (DRAIN) IMPLANT
GAUZE SPONGE 4X4 12PLY STRL (GAUZE/BANDAGES/DRESSINGS) ×5 IMPLANT
GAUZE XEROFORM 1X8 LF (GAUZE/BANDAGES/DRESSINGS) ×3 IMPLANT
GLOVE BIO SURGEON STRL SZ7 (GLOVE) ×3 IMPLANT
GLOVE BIO SURGEON STRL SZ7.5 (GLOVE) ×6 IMPLANT
GLOVE BIOGEL PI IND STRL 7.0 (GLOVE) ×1 IMPLANT
GLOVE BIOGEL PI IND STRL 8 (GLOVE) ×1 IMPLANT
GLOVE BIOGEL PI INDICATOR 7.0 (GLOVE) ×2
GLOVE BIOGEL PI INDICATOR 8 (GLOVE) ×2
GLOVE BIOGEL PI ORTHO PRO SZ8 (GLOVE) ×42
GLOVE ORTHO TXT STRL SZ7.5 (GLOVE) ×3 IMPLANT
GLOVE PI ORTHO PRO STRL SZ8 (GLOVE) ×21 IMPLANT
GLOVE SURG ORTHO 8.0 STRL STRW (GLOVE) ×6 IMPLANT
GOWN STRL REUS W/ TWL LRG LVL3 (GOWN DISPOSABLE) ×2 IMPLANT
GOWN STRL REUS W/ TWL XL LVL3 (GOWN DISPOSABLE) ×1 IMPLANT
GOWN STRL REUS W/TWL 2XL LVL3 (GOWN DISPOSABLE) IMPLANT
GOWN STRL REUS W/TWL LRG LVL3 (GOWN DISPOSABLE) ×6
GOWN STRL REUS W/TWL XL LVL3 (GOWN DISPOSABLE) ×3
GUIDEROD T2 3X1000 (ROD) ×2 IMPLANT
GUIDEWIRE GAMMA (WIRE) ×2 IMPLANT
HANDPIECE INTERPULSE COAX TIP (DISPOSABLE)
KIT BASIN OR (CUSTOM PROCEDURE TRAY) ×3 IMPLANT
KIT ROOM TURNOVER OR (KITS) ×3 IMPLANT
MANIFOLD NEPTUNE II (INSTRUMENTS) ×3 IMPLANT
NAIL FEMORAL A/R 10X420MM (Nail) ×2 IMPLANT
NDL HYPO 25GX1X1/2 BEV (NEEDLE) IMPLANT
NEEDLE 22X1 1/2 (OR ONLY) (NEEDLE) ×3 IMPLANT
NEEDLE HYPO 25GX1X1/2 BEV (NEEDLE) IMPLANT
NS IRRIG 1000ML POUR BTL (IV SOLUTION) ×6 IMPLANT
PACK GENERAL/GYN (CUSTOM PROCEDURE TRAY) ×3 IMPLANT
PACK ORTHO EXTREMITY (CUSTOM PROCEDURE TRAY) ×3 IMPLANT
PACK UNIVERSAL I (CUSTOM PROCEDURE TRAY) ×3 IMPLANT
PAD ARMBOARD 7.5X6 YLW CONV (MISCELLANEOUS) ×6 IMPLANT
PAD CAST 4YDX4 CTTN HI CHSV (CAST SUPPLIES) ×1 IMPLANT
PADDING CAST COTTON 4X4 STRL (CAST SUPPLIES) ×3
PADDING CAST COTTON 6X4 STRL (CAST SUPPLIES) ×9 IMPLANT
PIN APEX 5.0X180MM (PIN) ×4 IMPLANT
PIN CLAMP 5H 30DEG POST (EXFIX) ×2 IMPLANT
PIN TO ROD COUPLING EXFIX (EXFIX) ×8 IMPLANT
PIN TRANSFIX 615X300 EXFIX (EXFIX) ×4 IMPLANT
REAMER SHAFT BIXCUT (INSTRUMENTS) ×2 IMPLANT
ROD CARBON (EXFIX) ×6
ROD CNCT 400X11XNS LF HFMN (EXFIX) IMPLANT
ROD HOFFMANN3 CONNECT 11X150 (EXFIX) ×4 IMPLANT
ROD TO ROD COUPLING EXFIX (EXFIX) ×8 IMPLANT
SCREW BONE 3.5X14MM (Screw) ×6 IMPLANT
SCREW BONE 3.5X16MM (Screw) ×6 IMPLANT
SCREW BONE 3.5X18MM (Screw) ×2 IMPLANT
SCREW LOCKING FULL THREAD 5X52 (Screw) ×2 IMPLANT
SCREW LOCKING T2 F/T  5MMX40MM (Screw) ×2 IMPLANT
SCREW LOCKING T2 F/T  5MMX65MM (Screw) ×2 IMPLANT
SCREW LOCKING T2 F/T 5MMX40MM (Screw) IMPLANT
SCREW LOCKING T2 F/T 5MMX65MM (Screw) IMPLANT
SET HNDPC FAN SPRY TIP SCT (DISPOSABLE) IMPLANT
SPONGE LAP 18X18 X RAY DECT (DISPOSABLE) ×3 IMPLANT
SPONGE LAP 4X18 X RAY DECT (DISPOSABLE) ×3 IMPLANT
SPONGE SCRUB IODOPHOR (GAUZE/BANDAGES/DRESSINGS) ×3 IMPLANT
STAPLER VISISTAT 35W (STAPLE) IMPLANT
STOCKINETTE IMPERVIOUS LG (DRAPES) ×3 IMPLANT
STRIP CLOSURE SKIN 1/2X4 (GAUZE/BANDAGES/DRESSINGS) ×2 IMPLANT
SUCTION FRAZIER TIP 10 FR DISP (SUCTIONS) ×2 IMPLANT
SUT ETHILON 3 0 PS 1 (SUTURE) IMPLANT
SUT MNCRL AB 4-0 PS2 18 (SUTURE) ×3 IMPLANT
SUT MON AB 2-0 CT1 27 (SUTURE) ×3 IMPLANT
SUT VIC AB 0 CT1 27 (SUTURE) ×6
SUT VIC AB 0 CT1 27XBRD ANBCTR (SUTURE) ×2 IMPLANT
SUT VIC AB 2-0 CT1 27 (SUTURE) ×6
SUT VIC AB 2-0 CT1 TAPERPNT 27 (SUTURE) ×2 IMPLANT
SYR CONTROL 10ML LL (SYRINGE) ×3 IMPLANT
TOWEL NATURAL 10PK STERILE (DISPOSABLE) ×2 IMPLANT
TOWEL OR 17X24 6PK STRL BLUE (TOWEL DISPOSABLE) ×6 IMPLANT
TOWEL OR 17X26 10 PK STRL BLUE (TOWEL DISPOSABLE) ×6 IMPLANT
TOWEL OR NON WOVEN STRL DISP B (DISPOSABLE) ×3 IMPLANT
TUBE CONNECTING 12'X1/4 (SUCTIONS) ×1
TUBE CONNECTING 12X1/4 (SUCTIONS) ×2 IMPLANT
UNDERPAD 30X30 INCONTINENT (UNDERPADS AND DIAPERS) ×3 IMPLANT
WATER STERILE IRR 1000ML POUR (IV SOLUTION) ×6 IMPLANT
YANKAUER SUCT BULB TIP NO VENT (SUCTIONS) ×3 IMPLANT

## 2015-02-09 NOTE — Transfer of Care (Signed)
Immediate Anesthesia Transfer of Care Note  Patient: Jeremy Burgess  Procedure(s) Performed: Procedure(s): INTRAMEDULLARY (IM) RETROGRADE FEMORAL NAILING (Right) EXTERNAL FIXATION LEG (Right) OPEN REDUCTION INTERNAL FIXATION (ORIF) ULNAR FRACTURE (Right)  Patient Location: NICU  Anesthesia Type:General  Level of Consciousness: sedated and Patient remains intubated per anesthesia plan  Airway & Oxygen Therapy: Patient remains intubated per anesthesia plan and Patient placed on Ventilator (see vital sign flow sheet for setting)  Post-op Assessment: Report given to RN and Post -op Vital signs reviewed and stable  Post vital signs: Reviewed and stable  Last Vitals:  Filed Vitals:   02/09/15 1300  BP: 117/50  Pulse: 71  Temp: 37.6 C  Resp: 18    Complications: No apparent anesthesia complications   Pt tx from OR to ICU with standard monitors (HR, BP, SPO2). Emergency drugs and equipment available. Report given to ICU RN and all questions answered. Airway intact.    Garner Nash CRNA

## 2015-02-09 NOTE — Progress Notes (Signed)
Procedure(s) (LRB): INTRAMEDULLARY (IM) RETROGRADE FEMORAL NAILING (Right) EXTERNAL FIXATION LEG (Right) OPEN REDUCTION INTERNAL FIXATION (ORIF) ULNAR FRACTURE (Right) Subjective: CXR improved today Still req PRBC transfusions in past few hrs Min chest tube output Objective: Vital signs in last 24 hours: Temp:  [97.5 F (36.4 C)-101.8 F (38.8 C)] 99.5 F (37.5 C) (06/30 0754) Pulse Rate:  [52-147] 63 (06/30 0740) Cardiac Rhythm:  [-] Normal sinus rhythm (06/29 2000) Resp:  [11-35] 18 (06/30 0740) BP: (51-193)/(19-172) 85/44 mmHg (06/30 0740) SpO2:  [91 %-100 %] 100 % (06/30 0740) Arterial Line BP: (78-145)/(37-94) 106/45 mmHg (06/30 0715) FiO2 (%):  [40 %-100 %] 40 % (06/30 0740) Weight:  [230 lb (104.327 kg)-244 lb 0.8 oz (110.7 kg)] 244 lb 0.8 oz (110.7 kg) (06/29 1300)  Hemodynamic parameters for last 24 hours:  nsr  Intake/Output from previous day: 06/29 0701 - 06/30 0700 In: 7928.3 [I.V.:7250.8; Blood:677.5] Out: 990 [Urine:455; Emesis/NG output:115; Chest Tube:420] Intake/Output this shift:    Sedated on vent  Lab Results:  Recent Labs  02/09/15 0225 02/09/15 0623  WBC 7.5 7.5  HGB 7.7* 8.4*  HCT 22.4* 24.5*  PLT 105* 97*   BMET:  Recent Labs  02/08/15 1013 02/09/15 0225  NA 142 144  K 3.6 3.9  CL 108 114*  CO2 16* 19*  GLUCOSE 356* 136*  BUN 24* 37*  CREATININE 2.03* 2.97*  CALCIUM 9.2 7.3*    PT/INR:  Recent Labs  02/09/15 0225  LABPROT 16.4*  INR 1.31   ABG    Component Value Date/Time   PHART 7.322* 02/08/2015 1435   HCO3 21.0 02/08/2015 1435   TCO2 22.3 02/08/2015 1435   ACIDBASEDEF 4.0* 02/08/2015 1435   O2SAT 98.9 02/08/2015 1435   CBG (last 3)   Recent Labs  02/08/15 1942 02/08/15 2351 02/09/15 0437  GLUCAP 140* 148* 123*    Assessment/Plan: S/P Procedure(s) (LRB): INTRAMEDULLARY (IM) RETROGRADE FEMORAL NAILING (Right) EXTERNAL FIXATION LEG (Right) OPEN REDUCTION INTERNAL FIXATION (ORIF) ULNAR FRACTURE  (Right) Cont chest tubes and mech vent- not ready for rib plating now      Tharon Aquas Trigt III 02/09/2015

## 2015-02-09 NOTE — Progress Notes (Signed)
LOS: 2 days  Subjective: Awake, on vent. Denies neck pain, TTP, or pain with AROM.   Objective: Vital signs in last 24 hours: Temp:  [97.5 F (36.4 C)-101.8 F (38.8 C)] 99.9 F (37.7 C) (06/30 0900) Pulse Rate:  [52-147] 61 (06/30 0900) Resp:  [11-35] 20 (06/30 0900) BP: (51-193)/(19-172) 93/45 mmHg (06/30 0900) SpO2:  [91 %-100 %] 100 % (06/30 0900) Arterial Line BP: (78-145)/(37-94) 100/41 mmHg (06/30 0900) FiO2 (%):  [40 %-100 %] 40 % (06/30 0900) Weight:  [104.327 kg (230 lb)-110.7 kg (244 lb 0.8 oz)] 110.7 kg (244 lb 0.8 oz) (06/29 1300) Last BM Date:  (pta)   VENT: PRVC/40%/5PEEP/RR18/Vt634ml   UOP: 6ml/h NET: +6.9L  CT #1 No air leak 173ml/insertion  CT#2 No air leak 228ml/insertion   Laboratory CBC  Recent Labs  02/09/15 0225 02/09/15 0623  WBC 7.5 7.5  HGB 7.7* 8.4*  HCT 22.4* 24.5*  PLT 105* 97*   BMET  Recent Labs  02/08/15 1013 02/09/15 0225  NA 142 144  K 3.6 3.9  CL 108 114*  CO2 16* 19*  GLUCOSE 356* 136*  BUN 24* 37*  CREATININE 2.03* 2.97*  CALCIUM 9.2 7.3*   CBG (last 3)   Recent Labs  02/08/15 2351 02/09/15 0437 02/09/15 0753  GLUCAP 148* 123* 117*   Radiology PORTABLE CHEST - 1 VIEW  COMPARISON: 02/08/2015 (multiple examinations); chest CT - 02/07/2025  FINDINGS: Grossly unchanged cardiac silhouette and mediastinal contours poor. Stable positioning of support apparatus. Query tiny residual right apical pneumothorax with minimal amount of fluid layering along the right lung apex. Improved aeration of lung bases with persistent bibasilar opacities, right greater than left, likely atelectasis or contusion. No new focal airspace opacities. No evidence of edema. Unchanged bones including right lateral clavicular fracture and multiple displaced right-sided rib fractures.  IMPRESSION: 1. Stable position of support apparatus with interval decrease in size of residual tiny right apical pneumothorax. 2.  Improved aeration of lung bases with residual bibasilar opacities, likely atelectasis or contusion. No new focal airspace opacities. 3. Unchanged bones including old right distal clavicular fracture and multiple displaced right-sided rib fractures.   Electronically Signed  By: Sandi Mariscal M.D.  On: 02/09/2015 08:04   Physical Exam General appearance: alert and no distress Neck: No TTP Resp: clear to auscultation bilaterally Cardio: regular rate and rhythm GI: normal findings: bowel sounds normal and soft, non-tender Extremities: NVI   Assessment/Plan: Injured by tree falling Right displaced rib fractures 2-9 w/HPTX s/p CTx2 -- Will put to water seal. Possible plating by TTS later this week. Right ulna fracture -- For ORIF today Enlargement of right adrenal gland ?nodule vs hematoma Free fluid of right liver edge ? Small liver laceration Right femur fracture -- in skeletal traction. For OR today by Dr. Percell Miller Right ankle fracture -- For ORIF today Hematoma quad/adductor muscles ABL anemia -- Recheck this afternoon Thrombocytopenia -- Give one unit in advance of OR ARF -- Doing well on vent, can likely extubate after OR Acute vs acute-on-chronic renal failure -- Increase IVF, recheck this afternoon. Will consult renal. IDDM -- CBG's under good control HTN FEN -- Increase IVF. Cleared c-spine. VTE -- SCD's, start Lovenox once hgb stable Dispo -- OR today, extubate soon. Will likely be NWB right side therefore WC level, will likely affect placement.  Critical care time: 0905 -- 1005    Lisette Abu, PA-C Pager: [phone] General Trauma PA Pager: [phone]  02/09/2015

## 2015-02-09 NOTE — Progress Notes (Signed)
Patient escorted to operating room by OR staff. Patient aware of impending procedures . MD. In with patient , questions answered . Patient on monitor and stable.

## 2015-02-09 NOTE — Anesthesia Postprocedure Evaluation (Signed)
Anesthesia Post Note  Patient: Jeremy Burgess  Procedure(s) Performed: Procedure(s) (LRB): INTRAMEDULLARY (IM) RETROGRADE FEMORAL NAILING (Right) EXTERNAL FIXATION LEG (Right) OPEN REDUCTION INTERNAL FIXATION (ORIF) ULNAR FRACTURE (Right)  Anesthesia type: General  Patient location: SICU  Post pain: Pain level controlled  Post assessment: Post-op Vital signs reviewed  Last Vitals: BP 143/47 mmHg  Pulse 76  Temp(Src) 37.6 C (Core (Comment))  Resp 18  Ht 6\' 2"  (1.88 m)  Wt 244 lb 0.8 oz (110.7 kg)  BMI 31.32 kg/m2  SpO2 100%  Post vital signs: Reviewed  Level of consciousness: sedated/intubated  Complications: No apparent anesthesia complications

## 2015-02-09 NOTE — Anesthesia Procedure Notes (Signed)
Date/Time: 02/09/2015 2:14 PM Performed by: Jenne Campus Pre-anesthesia Checklist: Patient identified, Emergency Drugs available, Suction available, Patient being monitored and Timeout performed Patient Re-evaluated:Patient Re-evaluated prior to inductionOxygen Delivery Method: Circle system utilized Preoxygenation: Pre-oxygenation with 100% oxygen Intubation Type: Inhalational induction with existing ETT

## 2015-02-09 NOTE — Consult Note (Addendum)
Reason for Consult: Acute renal failure possibly on chronic kidney disease Referring Physician: Georganna Skeans M.D. (Trauma surgery)  HPI:  49 year old Caucasian man with past medical history significant for insulin-dependent diabetes and hypertension (on lisinopril) as well as preceding use of when necessary naproxen (unknown quantity) was brought to the emergency room after a tree that he was cutting accidentally fell on. Complaint of multifocal pain as well as shortness of breath and and was hypotensive in the emergency room where he was intubated for airway protection as he became progressively short of breath. Blood pressures improved with intravenous fluids/PRBCs and FFP. He had a CT scan of the abdomen/pelvis as well as CT scan of the chest with intravenous contrast (100 mL iohexol).  I do not have access to his baseline renal function-his admission creatinine was 2.0 and today had risen to 2.97. Urine output overnight was 450 mL and earlier today was 935 mL.   Past Medical History  Diagnosis Date  . Diabetes mellitus   . Hypertension     History reviewed. No pertinent past surgical history.  No family history on file.  Social History:  has no tobacco, alcohol, and drug history on file.  Allergies:  Allergies  Allergen Reactions  . Bee Venom Other (See Comments)    intolerance    Medications:  Scheduled: . sodium chloride   Intravenous Once  . sodium chloride   Intravenous Once  . antiseptic oral rinse  7 mL Mouth Rinse QID  . chlorhexidine  15 mL Mouth Rinse BID  . insulin aspart  0-20 Units Subcutaneous 6 times per day  . pantoprazole  40 mg Oral Daily   Or  . pantoprazole (PROTONIX) IV  40 mg Intravenous Daily  . sodium chloride  1,000 mL Intravenous Once    BMP Latest Ref Rng 02/09/2015 02/08/2015  Glucose 65 - 99 mg/dL 136(H) 356(H)  BUN 6 - 20 mg/dL 37(H) 24(H)  Creatinine 0.61 - 1.24 mg/dL 2.97(H) 2.03(H)  Sodium 135 - 145 mmol/L 144 142  Potassium 3.5 - 5.1  mmol/L 3.9 3.6  Chloride 101 - 111 mmol/L 114(H) 108  CO2 22 - 32 mmol/L 19(L) 16(L)  Calcium 8.9 - 10.3 mg/dL 7.3(L) 9.2   CBC Latest Ref Rng 02/09/2015 02/09/2015 02/09/2015  WBC 4.0 - 10.5 K/uL 7.5 7.5 8.6  Hemoglobin 13.0 - 17.0 g/dL 8.4(L) 7.7(L) 8.2(L)  Hematocrit 39.0 - 52.0 % 24.5(L) 22.4(L) 23.8(L)  Platelets 150 - 400 K/uL 97(L) 105(L) 119(L)     Radiological studies reviewed including CT abdomen/pelvis as well as CT chest.   Review of Systems  Unable to perform ROS: intubated   Blood pressure 143/47, pulse 76, temperature 99.7 F (37.6 C), temperature source Core (Comment), resp. rate 18, height 6\' 2"  (1.88 m), weight 110.7 kg (244 lb 0.8 oz), SpO2 100 %. Physical Exam  Nursing note and vitals reviewed. Constitutional: He appears well-developed and well-nourished.  HENT:  Head: Normocephalic and atraumatic.  Nose: Nose normal.  Neck: No JVD present. No tracheal deviation present.  Cardiovascular: Normal rate, regular rhythm and normal heart sounds.  Exam reveals no friction rub.   No murmur heard. Respiratory:  Mechanical breath sounds with asymmetric chest movement  GI: Soft. Bowel sounds are normal. He exhibits no distension. There is no tenderness. There is no guarding.  Musculoskeletal: He exhibits no edema.  Right leg in external fixation device Right forearm in soft cast No edema left lower extremity  Neurological:  Cannot assess  Skin: Skin is warm  and dry. No erythema.    Assessment/Plan: 1. Acute renal failure possibly on chronic kidney disease (baseline staging unclear): His acute renal failure is most likely from ATN surrounding his recent trauma with contributory component of contrast-induced nephropathy with recent CT abdomen/pelvis and chest requiring intravenous iodinated contrast. No evidence of obstruction seen on CT scan of his abdomen however, renal ultrasound is requested. I've sent for a CPK level in order to evaluate for possibility of  rhabdomyolysis. Urinalysis will be checked together with urine electrolytes. At this point, he is nonoliguric and does not have any acute electrolyte abnormalities prompting intervention/indications for dialysis at this time. I agree with continuing normal saline at 200 mL per hour for the next 6 hours and then decreasing rate down to 100 mL per hour. 2. Multifocal trauma: Earlier underwent open reduction and internal fixation of right femur,  external fixation of right ankle and  ORIF right forearm fractures, also with right rib fractures/right pulmonary contusion and traumatic hemopneumothorax.Further management per trauma team/TCTS. 3. Acute blood loss anemia: Improved status post packed red cell transfusion, continue to monitor closely for postoperative losses with PRBC as needed for hemodynamic support. 4. Anion gap metabolic acidosis: Will start oral sodium bicarbonate for buffering acid load.  Audri Kozub K. 02/09/2015, 6:07 PM

## 2015-02-09 NOTE — Care Management Note (Signed)
Case Management Note  Patient Details  Name: Phyllis Whitefield MRN: [id number] Date of Birth: 1965/10/07  Subjective/Objective:    Pt admitted on 02/08/15 after being struck by falling tree limb.  PTA, pt independent of ADLS, has supportive mother and 2 brothers.                  Action/Plan: Pt remains sedated and on ventilator currently.  Will follow for discharge planning as pt progresses.    Expected Discharge Date:                  Expected Discharge Plan:  [place]  In-House Referral:     Discharge planning Services  CM Consult  Post Acute Care Choice:    Choice offered to:     DME Arranged:    DME Agency:     HH Arranged:    [place] Agency:     Status of Service:  In process, will continue to follow  Medicare Important Message Given:    Date Medicare IM Given:    Medicare IM give by:    Date Additional Medicare IM Given:    Additional Medicare Important Message give by:     If discussed at [place] of Stay Meetings, dates discussed:    Additional Comments:  Reinaldo Raddle, RN, BSN  Trauma/Neuro ICU Case Manager [phone]

## 2015-02-09 NOTE — Op Note (Signed)
02/08/2015 - 02/09/2015  4:50 PM  PATIENT:  Jeremy Burgess    PRE-OPERATIVE DIAGNOSIS:  Right Femur, Ankle, and Forearm Fractures  POST-OPERATIVE DIAGNOSIS:  Same  PROCEDURE:  INTRAMEDULLARY (IM) RETROGRADE FEMORAL NAILING, EXTERNAL FIXATION LEG, OPEN REDUCTION INTERNAL FIXATION (ORIF) ULNAR FRACTURE  SURGEON:  Jhovani Griswold, Ernesta Amble, MD  ASSISTANT: Lovett Calender, PA-C, She was present and scrubbed throughout the case, critical for completion in a timely fashion, and for retraction, instrumentation, and closure.   ANESTHESIA:   gen  PREOPERATIVE INDICATIONS:  Holt Woolbright is a  49 y.o. male with a diagnosis of Right Femur, Ankle, and Forearm Fractures who failed conservative measures and elected for surgical management.    The risks benefits and alternatives were discussed with the patient preoperatively including but not limited to the risks of infection, bleeding, nerve injury, cardiopulmonary complications, the need for revision surgery, among others, and the patient was willing to proceed.  OPERATIVE IMPLANTS: Stryker nail, Hoffman ex fix, stryker titanium plate.   OPERATIVE FINDINGS: multiple fractures  BLOOD LOSS: 161  COMPLICATIONS: none  TOURNIQUET TIME: 52min R arm  OPERATIVE PROCEDURE:  Patient was identified in the preoperative holding area and site was marked by me He was transported to the operating theater and placed on the table in supine position taking care to pad all bony prominences. After a preincinduction time out anesthesia was induced. The R upper and R lower extremity was prepped and draped in normal sterile fashion and a pre-incision timeout was performed. He received ancef for preoperative antibiotics.   I covered the right upper extremity to keep it sterile throughout the lower extremity portion of the case.  I started with the femoral nail. The traction pin was prepped and removed prior to prepping and draping of the leg.  I made a medial parapatellar  approach to the knee.  I then inserted the guidepin at the tip of [place] line on the lateral centered on the AP.  I was happy with the placement on multiple x-rays. Next I used the entry reamer to create a pathway into the canal. Next I passed the ball-tipped guidewire and using closed manipulation was able to pass across the fracture. I seated it into the calcar region.  Next I sequentially reamed to a 12 reamer with excellent chatter.  I selected a 4 20 x 10 mm nail and seated this. I placed 2 distal static interlocks I confirmed appropriate rotation based on cortical width and matching with the knee Pointed. I then placed a proximal interlock.  I thoroughly irrigated all these wounds and closed them with a nylon stitch using a bicortical the capsule. Next I turned my attention to his tibial plateau fracture.  I made sure that my ex-fix pins were out of the fracture zone. I placed 2 transfix pins in the calcaneus and 2 anterior posterior ex-fix pins in the tibia. I took multiple x-rays and was happy with the placement of both.  Next I assembled the delta frame for appropriate traction tightened all of the frame and confirmed appropriate alignment. Took multiple x-rays and was happy with the alignment of the fracture. I dressed these and these incisions and placed a sterile dressing here as well.  Next I turned my attention to the right forearm I made a 7 cm incision directly over the ulna. I dissected down sharply to the bone and identified the fracture site I cleaned this out of soft tissue. I then clamped it together I placed a lag screw across  the fracture and had an excellent purchase. I then selected a 7 hole plate and placed it across the fracture neutralization fashion getting excellent purchase with all 6 screws. I then thoroughly irrigated this wound and closed the skin with a nylon stitch. As happy with the x-rays final x-rays. Sterile dressing was applied here as well.  POST  OPERATIVE PLAN: WBAT through the R elbow, NWB RLE. Chemical dvt px and scd's    This note was generated using a template and dragon dictation system. In light of that, I have reviewed the note and all aspects of it are applicable to this case. Any dictation errors are due to the computerized dictation system.

## 2015-02-09 NOTE — Progress Notes (Signed)
Chaplain Note:   Chaplain responded to a consult for prayer with Mr. Jeremy Burgess.  Pt could not speak but used hand gestures and differing gazes to communicate.   Throughout the visit he gave a number of thumbs up to indicate how he was feeling and his progress.   Chaplain prayed with pt and offered follow-up visits to which he agreed.   Will follow-up as needed.   Delford Field, Chaplain 02/09/2015

## 2015-02-09 NOTE — Progress Notes (Signed)
Sputum sample obtained (small, thin, white) and sent to lab by RT.

## 2015-02-09 NOTE — Anesthesia Preprocedure Evaluation (Signed)
Anesthesia Evaluation  Patient identified by MRN, date of birth, ID band Patient awake    Reviewed: Allergy & Precautions, NPO status , Patient's Chart, lab work & pertinent test results  Airway Mallampati: Intubated       Dental no notable dental hx.    Pulmonary neg pulmonary ROS,  breath sounds clear to auscultation  Pulmonary exam normal       Cardiovascular hypertension, Pt. on medications Normal cardiovascular examRhythm:Regular Rate:Normal     Neuro/Psych negative neurological ROS  negative psych ROS   GI/Hepatic negative GI ROS, Neg liver ROS,   Endo/Other  negative endocrine ROSdiabetes, Type 2, Insulin Dependent  Renal/GU ARFRenal disease     Musculoskeletal negative musculoskeletal ROS (+)   Abdominal   Peds  Hematology negative hematology ROS (+) anemia ,   Anesthesia Other Findings   Reproductive/Obstetrics                             Anesthesia Physical Anesthesia Plan  ASA: IV  Anesthesia Plan: General   Post-op Pain Management:    Induction: Intravenous  Airway Management Planned: Oral ETT  Additional Equipment:   Intra-op Plan:   Post-operative Plan: Extubation in OR  Informed Consent: I have reviewed the patients History and Physical, chart, labs and discussed the procedure including the risks, benefits and alternatives for the proposed anesthesia with the patient or authorized representative who has indicated his/her understanding and acceptance.   Dental advisory given  Plan Discussed with: CRNA  Anesthesia Plan Comments:         Anesthesia Quick Evaluation

## 2015-02-09 NOTE — Progress Notes (Signed)
Dr. Hulen Skains on phone, updated on pt's BP and labs. Order for PRBC. Will continue to monitor.

## 2015-02-09 NOTE — Progress Notes (Signed)
Called Dr. Percell Miller to clarify CT of ankle order. Order to have CT of ankle order done before Tuesday morning.  Will modify order.

## 2015-02-10 ENCOUNTER — Encounter (HOSPITAL_COMMUNITY): Payer: Self-pay | Admitting: Orthopedic Surgery

## 2015-02-10 ENCOUNTER — Other Ambulatory Visit (HOSPITAL_COMMUNITY): Payer: Self-pay

## 2015-02-10 ENCOUNTER — Inpatient Hospital Stay (HOSPITAL_COMMUNITY): Payer: Self-pay

## 2015-02-10 DIAGNOSIS — S270XXA Traumatic pneumothorax, initial encounter: Secondary | ICD-10-CM

## 2015-02-10 DIAGNOSIS — S2241XA Multiple fractures of ribs, right side, initial encounter for closed fracture: Secondary | ICD-10-CM

## 2015-02-10 DIAGNOSIS — R079 Chest pain, unspecified: Secondary | ICD-10-CM

## 2015-02-10 LAB — RENAL FUNCTION PANEL
Albumin: 2.7 g/dL — ABNORMAL LOW (ref 3.5–5.0)
Anion gap: 10 (ref 5–15)
BUN: 33 mg/dL — AB (ref 6–20)
CO2: 21 mmol/L — ABNORMAL LOW (ref 22–32)
Calcium: 7.1 mg/dL — ABNORMAL LOW (ref 8.9–10.3)
Chloride: 115 mmol/L — ABNORMAL HIGH (ref 101–111)
Creatinine, Ser: 1.89 mg/dL — ABNORMAL HIGH (ref 0.61–1.24)
GFR, EST AFRICAN AMERICAN: 46 mL/min — AB (ref >60)
GFR, EST NON AFRICAN AMERICAN: 40 mL/min — AB (ref >60)
Glucose, Bld: 137 mg/dL — ABNORMAL HIGH (ref 65–99)
PHOSPHORUS: 3.6 mg/dL (ref 2.5–4.6)
Potassium: 3.9 mmol/L (ref 3.5–5.1)
Sodium: 146 mmol/L — ABNORMAL HIGH (ref 135–145)

## 2015-02-10 LAB — PREPARE PLATELET PHERESIS: UNIT DIVISION: 0

## 2015-02-10 LAB — CBC
HCT: 26.9 % — ABNORMAL LOW (ref 39.0–52.0)
Hemoglobin: 9.3 g/dL — ABNORMAL LOW (ref 13.0–17.0)
MCH: 28.4 pg (ref 26.0–34.0)
MCHC: 34.6 g/dL (ref 30.0–36.0)
MCV: 82 fL (ref 78.0–100.0)
Platelets: 102 10*3/uL — ABNORMAL LOW (ref 150–400)
RBC: 3.28 MIL/uL — ABNORMAL LOW (ref 4.22–5.81)
RDW: 15.1 % (ref 11.5–15.5)
WBC: 8.1 10*3/uL (ref 4.0–10.5)

## 2015-02-10 LAB — GLUCOSE, CAPILLARY
GLUCOSE-CAPILLARY: 99 mg/dL (ref 65–99)
GLUCOSE-CAPILLARY: 128 mg/dL — AB (ref 65–99)
GLUCOSE-CAPILLARY: 135 mg/dL — AB (ref 65–99)
Glucose-Capillary: 101 mg/dL — ABNORMAL HIGH (ref 65–99)
Glucose-Capillary: 117 mg/dL — ABNORMAL HIGH (ref 65–99)
Glucose-Capillary: 124 mg/dL — ABNORMAL HIGH (ref 65–99)

## 2015-02-10 LAB — CK: CK TOTAL: 9554 U/L — AB (ref 49–397)

## 2015-02-10 MED ORDER — ACETAMINOPHEN 160 MG/5ML PO SOLN
650.0000 mg | Freq: Four times a day (QID) | ORAL | Status: DC | PRN
  Administered 2015-02-11: 650 mg
  Filled 2015-02-10: qty 20.3

## 2015-02-10 MED ORDER — PIVOT 1.5 CAL PO LIQD
1000.0000 mL | ORAL | Status: DC
  Administered 2015-02-11 – 2015-02-13 (×3): 1000 mL
  Filled 2015-02-10 (×5): qty 1000

## 2015-02-10 MED ORDER — SODIUM CHLORIDE 0.45 % IV SOLN
INTRAVENOUS | Status: DC
  Administered 2015-02-10: 15:00:00 via INTRAVENOUS
  Administered 2015-02-11: 100 mL/h via INTRAVENOUS
  Administered 2015-02-11 (×2): via INTRAVENOUS
  Administered 2015-02-12: 100 mL/h via INTRAVENOUS
  Administered 2015-02-12 – 2015-02-17 (×7): via INTRAVENOUS

## 2015-02-10 MED ORDER — SODIUM CHLORIDE 0.9 % IJ SOLN
10.0000 mL | INTRAMUSCULAR | Status: DC | PRN
  Administered 2015-02-21: 10 mL
  Filled 2015-02-10: qty 40

## 2015-02-10 MED ORDER — ALBUTEROL SULFATE (2.5 MG/3ML) 0.083% IN NEBU
2.5000 mg | INHALATION_SOLUTION | RESPIRATORY_TRACT | Status: DC
  Administered 2015-02-10 – 2015-02-14 (×25): 2.5 mg via RESPIRATORY_TRACT
  Filled 2015-02-10 (×24): qty 3

## 2015-02-10 MED ORDER — SODIUM CHLORIDE 0.9 % IJ SOLN
10.0000 mL | Freq: Two times a day (BID) | INTRAMUSCULAR | Status: DC
  Administered 2015-02-10: 10 mL
  Administered 2015-02-11: 30 mL
  Administered 2015-02-11 – 2015-02-12 (×2): 20 mL
  Administered 2015-02-12: 30 mL
  Administered 2015-02-13: 10 mL
  Administered 2015-02-13: 20 mL
  Administered 2015-02-14 – 2015-02-15 (×2): 10 mL
  Administered 2015-02-15: 20 mL
  Administered 2015-02-16 – 2015-02-17 (×3): 10 mL
  Administered 2015-02-17: 20 mL
  Administered 2015-02-18 – 2015-02-22 (×4): 10 mL

## 2015-02-10 MED ORDER — ALBUTEROL SULFATE (2.5 MG/3ML) 0.083% IN NEBU
INHALATION_SOLUTION | RESPIRATORY_TRACT | Status: AC
  Administered 2015-02-10: 2.5 mg via RESPIRATORY_TRACT
  Filled 2015-02-10: qty 3

## 2015-02-10 MED FILL — Medication: Qty: 1 | Status: AC

## 2015-02-10 NOTE — Progress Notes (Signed)
PT Cancellation Note  Patient Details Name: Jeremy Burgess MRN: [id number] DOB: 1966-03-16   Cancelled Treatment:    Reason Eval/Treat Not Completed: Patient not medically ready.  Spoke with Trauma team who indicates hold PT at this time.  Will check back as appropriate.     [name], Thornton Papas 02/10/2015, 9:04 AM

## 2015-02-10 NOTE — Progress Notes (Signed)
Orthopedic Tech Progress Note Patient Details:  Jeremy Burgess [birth year] [id number]  Ortho Devices Type of Ortho Device: Velcro wrist forearm splint Ortho Device/Splint Location: RUE brace at bedside Ortho Device/Splint Interventions: Criss Alvine 02/10/2015, 6:50 PM

## 2015-02-10 NOTE — Progress Notes (Signed)
     Subjective:  POD#1 R IM nail of the femur, Ex-fix of the R ankle and ORIF of the R Ulna. Intubated, but able to nod that pain is well controlled at this time.    Objective:   VITALS:   Filed Vitals:   02/10/15 0332 02/10/15 0400 02/10/15 0500 02/10/15 0600  BP: 119/56 116/57 120/52 122/56  Pulse: 78 78 81 88  Temp:  100.9 F (38.3 C) 101.1 F (38.4 C) 101.1 F (38.4 C)  TempSrc:      Resp: 18 16 18 18   Height:      Weight:      SpO2: 98% 98% 94% 93%    Neurologically intact ABD soft Neurovascular intact Sensation intact distally Intact pulses distally Incision: moderate drainage of the proximal incision on the R femur.  Pressure dressing in place. Ex-fix to the R ankle  Lab Results  Component Value Date   WBC 8.1 02/10/2015   HGB 9.3* 02/10/2015   HCT 26.9* 02/10/2015   MCV 82.0 02/10/2015   PLT 102* 02/10/2015   BMET    Component Value Date/Time   NA 146* 02/10/2015 0549   K 3.9 02/10/2015 0549   CL 115* 02/10/2015 0549   CO2 21* 02/10/2015 0549   GLUCOSE 137* 02/10/2015 0549   BUN 33* 02/10/2015 0549   CREATININE 1.89* 02/10/2015 0549   CALCIUM 7.1* 02/10/2015 0549   GFRNONAA 40* 02/10/2015 0549   GFRAA 46* 02/10/2015 0549     Assessment/Plan: 1 Day Post-Op   Active Problems:   Accidentally struck by falling tree   Insulin dependent diabetes mellitus   Traumatic closed displaced fracture of multiple ribs of right side   Closed fracture of right ulna   Femur fracture, right   Ankle fracture, right   Acute blood loss anemia   Thrombocytopenia   Acute renal failure   Acute respiratory failure   Traumatic hemopneumothorax   Up with therapy NWB in the RLE, WBAT in the RUE to use a platform walker only DVT prophylaxis per trauma team Moderate drainage from the proximal incision of the femur.  Changed the dressing this am and put new pressure dressing over.  Will continue to monitor.  Plan to take to the OR Tuesday for ORIF of the R  ankle  Jeremy Burgess 02/10/2015, 8:03 AM Cell [phone]

## 2015-02-10 NOTE — Progress Notes (Signed)
Pts O2 sat kept dropping to 88. Suctioned but no change. Changed FIO2 to 50% to keep sats at 92.

## 2015-02-10 NOTE — Progress Notes (Signed)
Notified MD Wyatt of patients temp of 101.7. No orders at this time. Patient resting in bed. Denies any discomfort. Will continue to monitor.

## 2015-02-10 NOTE — Progress Notes (Signed)
Follow up - Trauma and Critical Care  Patient Details:    Jeremy Burgess is an 49 y.o. male.  Lines/tubes : Airway 7.5 mm (Active)  Secured at (cm) 23 cm 02/10/2015  3:32 AM  Measured From Lips 02/10/2015  3:32 AM  Secured Location Center 02/10/2015  3:32 AM  Secured By [employer] 02/10/2015  3:32 AM  Tube Holder Repositioned Yes 02/10/2015  3:32 AM  Cuff Pressure (cm H2O) 24 cm H2O 02/10/2015 12:03 AM  Site Condition Dry 02/10/2015  3:32 AM     CVC Triple Lumen 02/08/15 Left Femoral (Active)  Indication for Insertion or Continuance of Line Head or chest injuries (Tracheotomy, burns, open chest wounds);Vasoactive infusions;Limited venous access - need for IV therapy >5 days (PICC only) 02/10/2015  7:30 AM  Site Assessment Clean;Intact;Dry 02/10/2015  7:30 AM  Proximal Lumen Status Infusing 02/10/2015  7:30 AM  Medial Infusing 02/10/2015  7:30 AM  Distal Lumen Status Infusing 02/10/2015  7:30 AM  Dressing Type Transparent 02/10/2015  7:30 AM  Dressing Status Clean;Dry;Intact 02/10/2015  7:30 AM  Line Care Connections checked and tightened 02/10/2015  7:30 AM  Dressing Change Due 02/15/15 02/10/2015  7:30 AM     Chest Tube 1 Right Pleural 28 Fr. (Active)  Suction To water seal 02/10/2015  8:00 AM  Chest Tube Air Leak None 02/10/2015  8:00 AM  Patency Intervention Tip/tilt 02/08/2015  8:00 PM  Drainage Description Bright red 02/10/2015  8:00 AM  Dressing Status Dry;Intact 02/10/2015  8:00 AM  Dressing Intervention Dressing reinforced 02/08/2015  1:40 PM  Site Assessment Clean;Dry;Intact 02/10/2015  8:00 AM  Surrounding Skin Dry 02/10/2015  8:00 AM  Output (mL) 40 mL 02/10/2015  5:00 AM     Chest Tube 2 Pleural 32 Fr. (Active)  Suction To water seal 02/10/2015  8:00 AM  Chest Tube Air Leak None 02/10/2015  8:00 AM  Patency Intervention Tip/tilt 02/08/2015  8:00 PM  Drainage Description Bright red 02/10/2015  8:00 AM  Dressing Status Dry;Intact 02/10/2015  8:00 AM  Dressing Intervention Dressing reinforced 02/08/2015  1:40  PM  Site Assessment Clean;Dry;Intact 02/10/2015  8:00 AM  Surrounding Skin Dry 02/10/2015  8:00 AM  Output (mL) 120 mL 02/10/2015  5:00 AM     NG/OG Tube Orogastric Center mouth (Active)  Placement Verification Auscultation 02/09/2015  8:00 AM  Site Assessment Clean;Dry;Intact 02/10/2015  8:00 AM  Status Suction-low intermittent 02/10/2015  8:00 AM  Drainage Appearance Bile 02/10/2015  8:00 AM  Output (mL) 100 mL 02/10/2015  6:00 AM     Urethral Catheter [name] Straight-tip;Temperature probe 16 Fr. (Active)  Indication for Insertion or Continuance of Catheter Unstable critical patients (first 24-48 hours) 02/09/2015  8:00 AM  Site Assessment Intact;Clean 02/10/2015  8:00 AM  Catheter Maintenance Bag below level of bladder;Drainage bag/tubing not touching floor;Insertion date on drainage bag;No dependent loops;Seal intact;Bag emptied prior to transport;Catheter secured 02/10/2015  8:00 AM  Collection Container Standard drainage bag 02/10/2015  8:00 AM  Securement Method Securing device (Describe) 02/10/2015  8:00 AM  Output (mL) 300 mL 02/10/2015  8:00 AM    Microbiology/Sepsis markers: Results for orders placed or performed during the hospital encounter of 02/08/15  MRSA PCR Screening     Status: None   Collection Time: 02/08/15  3:37 PM  Result Value Ref Range Status   MRSA by PCR NEGATIVE NEGATIVE Final    Comment:        The GeneXpert MRSA Assay (FDA approved for NASAL specimens  only), is one component of a comprehensive MRSA colonization surveillance program. It is not intended to diagnose MRSA infection nor to guide or monitor treatment for MRSA infections.     Anti-infectives:  Anti-infectives    Start     Dose/Rate Route Frequency Ordered Stop   02/09/15 2030  ceFAZolin (ANCEF) IVPB 2 g/50 mL premix     2 g 100 mL/hr over 30 Minutes Intravenous Every 6 hours 02/09/15 1841 02/10/15 1429   02/09/15 1230  ceFAZolin (ANCEF) IVPB 2 g/50 mL premix     2 g 100 mL/hr over 30 Minutes  Intravenous To ShortStay Surgical 02/08/15 1418 02/09/15 1425      Best Practice/Protocols:  VTE Prophylaxis: Mechanical GI Prophylaxis: Proton Pump Inhibitor Continous Sedation  Consults: Treatment Team:  Trauma Md, MD [name], MD Ivin Poot, MD Estanislado Emms, MD    Events:  Subjective:    Overnight Issues: Urine output has improved.  Patient is awake and alert.  Improved renal function.  Oxygenation has diminished a bit overnight.  Wheezing increased and compliance decreased.  Objective:  Vital signs for last 24 hours: Temp:  [98.8 F (37.1 C)-101.1 F (38.4 C)] 100.8 F (38.2 C) (07/01 0800) Pulse Rate:  [61-88] 81 (07/01 0800) Resp:  [14-20] 19 (07/01 0800) BP: (93-178)/(45-92) 123/59 mmHg (07/01 0800) SpO2:  [93 %-100 %] 98 % (07/01 0800) Arterial Line BP: (100-176)/(41-65) 169/58 mmHg (06/30 1900) FiO2 (%):  [40 %-50 %] 50 % (07/01 0800)  Hemodynamic parameters for last 24 hours:    Intake/Output from previous day: 06/30 0701 - 07/01 0700 In: 8237.1 [I.V.:7058.1; Blood:1079; IV Piggyback:100] Out: 3845 [Urine:2935; Emesis/NG output:500; Blood:100; Chest Tube:310]  Intake/Output this shift: Total I/O In: 229.3 [I.V.:229.3] Out: 300 [Urine:300]  Vent settings for last 24 hours: Vent Mode:  [-] PRVC FiO2 (%):  [40 %-50 %] 50 % Set Rate:  [18 bmp] 18 bmp Vt Set:  [784 mL] 620 mL PEEP:  [5 cmH20] 5 cmH20 Plateau Pressure:  [18 ONG29-52 cmH20] 24 cmH20  Physical Exam:  General: alert, no respiratory distress and but peak airway pressures increased Neuro: alert, oriented and nonfocal exam Resp: wheezes bilaterally CVS: regular rate and rhythm, S1, S2 normal, no murmur, click, rub or gallop GI: Soft, not apparently tender.  some bowel sounds. Extremities: right below knee extremity in external fixator.  Oozing from right thigh wound.  Results for orders placed or performed during the hospital encounter of 02/08/15 (from the past 24  hour(s))  Prepare Pheresed Platelets     Status: None (Preliminary result)   Collection Time: 02/09/15 10:06 AM  Result Value Ref Range   Unit Number [id number]    Blood Component Type PLTP LR1 PAS    Unit division 00    Status of Unit ISSUED    Transfusion Status OK TO TRANSFUSE   Glucose, capillary     Status: Abnormal   Collection Time: 02/09/15 11:24 AM  Result Value Ref Range   Glucose-Capillary 115 (H) 65 - 99 mg/dL  Provider-confirm verbal Blood Bank order - RBC; 2 Units; Order taken: 02/09/2015; 1:15 PM; Surgery, STAT 2 units verbally ordered by Anderson Malta, CRNA.     Status: None   Collection Time: 02/09/15  1:16 PM  Result Value Ref Range   Blood product order confirm MD AUTHORIZATION REQUESTED   Provider-confirm verbal Blood Bank order - Type & Screen, RBC, FFP; 2 Units; Order taken: 02/08/2015; 9:40 AM; Level 1 Trauma, Emergency Release, STAT Emergency released  O neg rbcs and Group A Liquid Plasma were transfused.     Status: None   Collection Time: 02/09/15  3:25 PM  Result Value Ref Range   Blood product order confirm MD AUTHORIZATION REQUESTED   I-STAT 4, (NA,K, GLUC, HGB,HCT)     Status: Abnormal   Collection Time: 02/09/15  3:38 PM  Result Value Ref Range   Sodium 146 (H) 135 - 145 mmol/L   Potassium 3.7 3.5 - 5.1 mmol/L   Glucose, Bld 122 (H) 65 - 99 mg/dL   HCT 21.0 (L) 39.0 - 52.0 %   Hemoglobin 7.1 (L) 13.0 - 17.0 g/dL  I-STAT 4, (NA,K, GLUC, HGB,HCT)     Status: Abnormal   Collection Time: 02/09/15  4:31 PM  Result Value Ref Range   Sodium 147 (H) 135 - 145 mmol/L   Potassium 3.9 3.5 - 5.1 mmol/L   Glucose, Bld 134 (H) 65 - 99 mg/dL   HCT 26.0 (L) 39.0 - 52.0 %   Hemoglobin 8.8 (L) 13.0 - 17.0 g/dL  Basic metabolic panel     Status: Abnormal   Collection Time: 02/09/15  5:55 PM  Result Value Ref Range   Sodium 147 (H) 135 - 145 mmol/L   Potassium 3.9 3.5 - 5.1 mmol/L   Chloride 118 (H) 101 - 111 mmol/L   CO2 22 22 - 32 mmol/L   Glucose, Bld 134 (H) 65 -  99 mg/dL   BUN 39 (H) 6 - 20 mg/dL   Creatinine, Ser 2.55 (H) 0.61 - 1.24 mg/dL   Calcium 7.2 (L) 8.9 - 10.3 mg/dL   GFR calc non Af Amer 28 (L) >60 mL/min   GFR calc Af Amer 32 (L) >60 mL/min   Anion gap 7 5 - 15  CBC     Status: Abnormal   Collection Time: 02/09/15  5:55 PM  Result Value Ref Range   WBC 8.6 4.0 - 10.5 K/uL   RBC 3.46 (L) 4.22 - 5.81 MIL/uL   Hemoglobin 9.6 (L) 13.0 - 17.0 g/dL   HCT 28.3 (L) 39.0 - 52.0 %   MCV 81.8 78.0 - 100.0 fL   MCH 27.7 26.0 - 34.0 pg   MCHC 33.9 30.0 - 36.0 g/dL   RDW 14.8 11.5 - 15.5 %   Platelets 111 (L) 150 - 400 K/uL  Glucose, capillary     Status: None   Collection Time: 02/09/15  7:38 PM  Result Value Ref Range   Glucose-Capillary 97 65 - 99 mg/dL  CK     Status: Abnormal   Collection Time: 02/09/15 11:30 PM  Result Value Ref Range   Total CK 9554 (H) 49 - 397 U/L  Glucose, capillary     Status: Abnormal   Collection Time: 02/10/15 12:34 AM  Result Value Ref Range   Glucose-Capillary 117 (H) 65 - 99 mg/dL  Glucose, capillary     Status: None   Collection Time: 02/10/15  5:09 AM  Result Value Ref Range   Glucose-Capillary 99 65 - 99 mg/dL  Renal function panel     Status: Abnormal   Collection Time: 02/10/15  5:49 AM  Result Value Ref Range   Sodium 146 (H) 135 - 145 mmol/L   Potassium 3.9 3.5 - 5.1 mmol/L   Chloride 115 (H) 101 - 111 mmol/L   CO2 21 (L) 22 - 32 mmol/L   Glucose, Bld 137 (H) 65 - 99 mg/dL   BUN 33 (H) 6 - 20 mg/dL  Creatinine, Ser 1.89 (H) 0.61 - 1.24 mg/dL   Calcium 7.1 (L) 8.9 - 10.3 mg/dL   Phosphorus 3.6 2.5 - 4.6 mg/dL   Albumin 2.7 (L) 3.5 - 5.0 g/dL   GFR calc non Af Amer 40 (L) >60 mL/min   GFR calc Af Amer 46 (L) >60 mL/min   Anion gap 10 5 - 15  CBC     Status: Abnormal   Collection Time: 02/10/15  5:49 AM  Result Value Ref Range   WBC 8.1 4.0 - 10.5 K/uL   RBC 3.28 (L) 4.22 - 5.81 MIL/uL   Hemoglobin 9.3 (L) 13.0 - 17.0 g/dL   HCT 26.9 (L) 39.0 - 52.0 %   MCV 82.0 78.0 - 100.0 fL    MCH 28.4 26.0 - 34.0 pg   MCHC 34.6 30.0 - 36.0 g/dL   RDW 15.1 11.5 - 15.5 %   Platelets 102 (L) 150 - 400 K/uL  Glucose, capillary     Status: Abnormal   Collection Time: 02/10/15  7:30 AM  Result Value Ref Range   Glucose-Capillary 128 (H) 65 - 99 mg/dL   Comment 1 Document in Chart      Assessment/Plan:   NEURO  Altered Mental Status:  sedation and but still responsive   Plan: Wean sedation as tolerated.  PULM  Atelectasis/collapse (focal and right lung) Chest Wall Trauma multiple rib fractures and Lung Trauma (right and with contusion of lung)   Plan: Continue to support.  Give bronchodilator for wheezing andincreased airway pressure.  Drop tidal volume and increase rate.  CARDIO  No specific issues   Plan: CPM  RENAL  Urine output has been good   Plan: CPM  GI  No specific issues.   Plan: CPM  ID  No known infectious sources   Plan: CPM  HEME  Anemia acute blood loss anemia and has improved and stabilized.)   Plan: In spite of not having a great source of blood loss, he has required several units of blood and his hemoglobin has finally stabilized.  ENDO No specific issues.   Plan: CPM  Global Issues  Probably will not be able to extubate today.  Wheezing and increased airway pressure.  Dropped tidal volume and started bronchodilators.  Anemia stable.  Neurologically the patient is intact.  Orthopedically he will need more surgery on his right ankle to remove external fixator.  Oozing from groin wound on the right is not severe but will be watched.    LOS: 1 day   Additional comments:I reviewed the patient's new clinical lab test results. cbc/bmet and I reviewed the patients new imaging test results. cxr  Critical Care Total Time*: 30 Minutes  Dejanira Pamintuan 02/10/2015  *Care during the described time interval was provided by me and/or other providers on the critical care team.  I have reviewed this patient's available data, including medical history, events of note,  physical examination and test results as part of my evaluation.

## 2015-02-10 NOTE — Progress Notes (Signed)
1 Day Post-Op Procedure(s) (LRB): INTRAMEDULLARY (IM) RETROGRADE FEMORAL NAILING (Right) EXTERNAL FIXATION LEG (Right) OPEN REDUCTION INTERNAL FIXATION (ORIF) ULNAR FRACTURE (Right) Subjective: Patient remains on ventilator for blunt right chest trauma with rib fractures, poorly contusion, pneumothorax, pleural effusion. Patient is responsive on the ventilator and denies chest pain.  Echocardiogram performed today independently reviewed showing normal biventricular function, no pericardial effusion  Transtracheal aspirate shows gram-positive cocci in clusters, final culture pending..  Objective: Vital signs in last 24 hours: Temp:  [99.3 F (37.4 C)-101.7 F (38.7 C)] 101.5 F (38.6 C) (07/01 1800) Pulse Rate:  [75-91] 91 (07/01 1800) Cardiac Rhythm:  [-] Normal sinus rhythm (07/01 1200) Resp:  [6-21] 20 (07/01 1800) BP: (98-178)/(48-75) 141/62 mmHg (07/01 1800) SpO2:  [89 %-100 %] 91 % (07/01 1800) Arterial Line BP: (138-193)/(50-78) 170/61 mmHg (07/01 1800) FiO2 (%):  [40 %-50 %] 50 % (07/01 1800)  Hemodynamic parameters for last 24 hours:   off pressor worse, sinus rhythm  Intake/Output from previous day: 06/30 0701 - 07/01 0700 In: 8237.1 [I.V.:7058.1; Blood:1079; IV Piggyback:100] Out: 3845 [Urine:2935; Emesis/NG output:500; Blood:100; Chest Tube:310] Intake/Output this shift: Total I/O In: 1870 [I.V.:1820; IV Piggyback:50] Out: 2385 [Urine:2150; Emesis/NG output:125; Chest Tube:110]  Coarse breath sounds right greater than left Minimal air leak from chest tubes minimal drainage of pleural fluid  Lab Results:  Recent Labs  02/09/15 1755 02/10/15 0549  WBC 8.6 8.1  HGB 9.6* 9.3*  HCT 28.3* 26.9*  PLT 111* 102*   BMET:  Recent Labs  02/09/15 1755 02/10/15 0549  NA 147* 146*  K 3.9 3.9  CL 118* 115*  CO2 22 21*  GLUCOSE 134* 137*  BUN 39* 33*  CREATININE 2.55* 1.89*  CALCIUM 7.2* 7.1*    PT/INR:  Recent Labs  02/09/15 0225  LABPROT 16.4*  INR  1.31   ABG    Component Value Date/Time   PHART 7.322* 02/08/2015 1435   HCO3 21.0 02/08/2015 1435   TCO2 22.3 02/08/2015 1435   ACIDBASEDEF 4.0* 02/08/2015 1435   O2SAT 98.9 02/08/2015 1435   CBG (last 3)   Recent Labs  02/10/15 0730 02/10/15 1141 02/10/15 1619  GLUCAP 128* 135* 101*    Assessment/Plan: S/P Procedure(s) (LRB): INTRAMEDULLARY (IM) RETROGRADE FEMORAL NAILING (Right) EXTERNAL FIXATION LEG (Right) OPEN REDUCTION INTERNAL FIXATION (ORIF) ULNAR FRACTURE (Right) Patient planned on having more surgery to right lower extremity next week. We'll follow progress to determine if rib plating would be beneficial at a later time after lower extremity external fixation is removed  LOS: 1 day    Tharon Aquas Trigt III 02/10/2015

## 2015-02-10 NOTE — Progress Notes (Signed)
Assessment: 1. Acute renal failure- possibly on chronic kidney disease (baseline staging unclear), recovering 2. Hypernatremia/hyperchoremia  Rec: Stop sodium citrate Change IVF to 1/2 NS(done) and decrease rate to 100cc/hr We will sign off  Objective: Vital signs in last 24 hours: Temp:  [98.8 F (37.1 C)-101.1 F (38.4 C)] 100.9 F (38.3 C) (07/01 1100) Pulse Rate:  [71-88] 79 (07/01 1100) Resp:  [14-20] 20 (07/01 1100) BP: (98-178)/(47-75) 121/55 mmHg (07/01 1100) SpO2:  [93 %-100 %] 99 % (07/01 1228) Arterial Line BP: (134-176)/(45-78) 147/69 mmHg (07/01 1100) FiO2 (%):  [40 %-50 %] 50 % (07/01 1228) Weight change:   Intake/Output from previous day: 06/30 0701 - 07/01 0700 In: 8237.1 [I.V.:7058.1; Blood:1079; IV Piggyback:100] Out: 3845 [Urine:2935; Emesis/NG output:500; Blood:100; Chest Tube:310] Intake/Output this shift: Total I/O In: 930 [I.V.:880; IV Piggyback:50] Out: 300 [Urine:300]  General appearance: alert Resp: clear to auscultation bilaterally GI: soft, non-tender; bowel sounds normal; no masses,  no organomegaly Extremities: edema none in LLE , RLE with external fixation device  Lab Results:  Recent Labs  02/09/15 1755 02/10/15 0549  WBC 8.6 8.1  HGB 9.6* 9.3*  HCT 28.3* 26.9*  PLT 111* 102*   BMET:  Recent Labs  02/09/15 1755 02/10/15 0549  NA 147* 146*  K 3.9 3.9  CL 118* 115*  CO2 22 21*  GLUCOSE 134* 137*  BUN 39* 33*  CREATININE 2.55* 1.89*  CALCIUM 7.2* 7.1*   No results for input(s): PTH in the last 72 hours. Iron Studies: No results for input(s): IRON, TIBC, TRANSFERRIN, FERRITIN in the last 72 hours. Studies/Results: Dg Forearm Right  02/09/2015   CLINICAL DATA:  ORIF of a right forearm fracture.  EXAM: RIGHT FOREARM - 2 VIEW  COMPARISON:  02/08/2015  FINDINGS: Fusion plate and fixation screws reduce the midshaft right ulnar fracture into anatomic alignment. There is no new fracture or evidence of an operative complication.   IMPRESSION: Well-aligned fracture fragments following ORIF   Electronically Signed   By: Lajean Manes M.D.   On: 02/09/2015 17:42   Dg Forearm Right  02/08/2015   CLINICAL DATA:  Level 1 trauma. Patient fall from tree. RIGHT forearm deformity.  EXAM: RIGHT FOREARM - 2 VIEW  COMPARISON:  None.  FINDINGS: Oblique fracture of the midshaft of the RIGHT ulna. The fracture is mildly displaced, with 1 cortex width posterior displacement evident on the lateral projection.  Spurring is present at the olecranon and radial tuberosity. Small amount of radiopaque dye is present in the volar aspect of the forearm, consistent with a small extravasation for contrast-enhanced CT.  There is deformity of the distal radius with loss of the normal volar tilt. The fracture planes are not visible and there is ulnar positive variance. Together these findings suggest an old healed distal radius fracture.  IMPRESSION: 1. Oblique mildly displaced midshaft RIGHT ulnar fracture. 2. Deformity of the distal radius is favored to represent an old healed fracture. Dedicated wrist radiographs recommended for further assessment as acute fracture planes could be obscured by parallax. 3. Small volume iodinated contrast extravasation in the volar forearm at the site of IV access.   Electronically Signed   By: Dereck Ligas M.D.   On: 02/08/2015 12:56   Dg Ankle Complete Right  02/09/2015   CLINICAL DATA:  ORIF of right ankle fracture.  EXAM: RIGHT ANKLE - COMPLETE 3+ VIEW  COMPARISON:  02/08/2015  FINDINGS: Metal pins have been inserted into the calcaneus and tibia or presumably an external fixator. Distal  tibial fracture is not well-defined on these images. Ankle mortise appears normally aligned. The distal fibular fracture is well aligned.  IMPRESSION: ORIF of right ankle fractures   Electronically Signed   By: Lajean Manes M.D.   On: 02/09/2015 17:45   US Renal Port  02/09/2015   CLINICAL DATA:  Acute renal failure.  EXAM: RENAL / URINARY  TRACT ULTRASOUND COMPLETE  COMPARISON:  None.  FINDINGS: Right Kidney:  Length: 10.1 cm. Echogenicity within normal limits. No mass or hydronephrosis visualized.  Left Kidney:  Length: 11.0 cm. Echogenicity within normal limits. No mass or hydronephrosis visualized.  Bladder:  Decompressed around a Foley catheter.  IMPRESSION: Negative for hydronephrosis. No focal parenchymal abnormalities evident.  Study is limited due to numerous bandages, limiting acoustic windows.   Electronically Signed   By: Andreas Newport M.D.   On: 02/09/2015 22:57   Dg Chest Port 1 View  02/10/2015   CLINICAL DATA:  Follow up traumatic hemothorax.  EXAM: PORTABLE CHEST - 1 VIEW  COMPARISON:  02/09/2015 and 02/08/2015 radiographs.  CT 02/08/2015.  FINDINGS: 0518 hr. The endotracheal tube, nasogastric tube and 2 right-sided chest tubes are unchanged in position. The heart size and mediastinal contours are stable. There is increased opacity inferiorly in the right hemithorax, likely due to a combination of atelectasis and pleural fluid. The left lung is clear. There is some soft tissue emphysema within the right chest wall. No pneumothorax identified. Multiple right-sided rib fractures again noted.  IMPRESSION: Interval increased density in the right hemithorax, likely atelectasis and pleural fluid. No pneumothorax or mediastinal shift.   Electronically Signed   By: Richardean Sale M.D.   On: 02/10/2015 08:03   Dg Chest Port 1 View  02/09/2015   CLINICAL DATA:  Pneumothorax  EXAM: PORTABLE CHEST - 1 VIEW  COMPARISON:  02/08/2015 (multiple examinations); chest CT - 02/07/2025  FINDINGS: Grossly unchanged cardiac silhouette and mediastinal contours poor. Stable positioning of support apparatus. Query tiny residual right apical pneumothorax with minimal amount of fluid layering along the right lung apex. Improved aeration of lung bases with persistent bibasilar opacities, right greater than left, likely atelectasis or contusion. No new  focal airspace opacities. No evidence of edema. Unchanged bones including right lateral clavicular fracture and multiple displaced right-sided rib fractures.  IMPRESSION: 1. Stable position of support apparatus with interval decrease in size of residual tiny right apical pneumothorax. 2. Improved aeration of lung bases with residual bibasilar opacities, likely atelectasis or contusion. No new focal airspace opacities. 3. Unchanged bones including old right distal clavicular fracture and multiple displaced right-sided rib fractures.   Electronically Signed   By: Sandi Mariscal M.D.   On: 02/09/2015 08:04   Dg Foot 2 Views Right  02/08/2015   CLINICAL DATA:  Tree fell on patient's RIGHT-side today, leg in traction  EXAM: RIGHT FOOT - 2 VIEW  COMPARISON:  RIGHT ankle radiographs 02/07/2017  FINDINGS: External artifacts contraction.  Osseous mineralization normal.  Plantar and Achilles insertion calcaneal spurs.  Nondisplaced lateral malleolar fracture.  Comminuted distal tibial fracture seen on preceding ankle radiographs is poorly visualized by this exam.  Fiberglass splint obscures bone detail.  No additional foot fracture, dislocation or bone destruction.  Degenerative changes RIGHT first MTP joint.  IMPRESSION: Lateral malleolar fracture.  Comminuted distal tibial fracture seen on ankle radiographs is poorly visualized by this exam due to nonstandard positioning and artifacts.  No definite foot fractures identified.   Electronically Signed   By: Lavonia Dana  M.D.   On: 02/08/2015 16:41   Dg C-arm 61-120 Min  02/09/2015   CLINICAL DATA:  ORIF of right femur.  EXAM: DG C-ARM 61-120 MIN; RIGHT FEMUR 2 VIEWS  COMPARISON:  02/08/2015  FINDINGS: The patient has undergone open reduction and internal fixation of the right femur fracture. There has been placement of an intra medullary rod and screw device. The hardware components and fracture fragment are in anatomic alignment.  IMPRESSION: Status post ORIF of right femur  fracture.   Electronically Signed   By: Kerby Moors M.D.   On: 02/09/2015 17:40   Dg Femur, Min 2 Views Right  02/09/2015   CLINICAL DATA:  ORIF of right femur.  EXAM: DG C-ARM 61-120 MIN; RIGHT FEMUR 2 VIEWS  COMPARISON:  02/08/2015  FINDINGS: The patient has undergone open reduction and internal fixation of the right femur fracture. There has been placement of an intra medullary rod and screw device. The hardware components and fracture fragment are in anatomic alignment.  IMPRESSION: Status post ORIF of right femur fracture.   Electronically Signed   By: Kerby Moors M.D.   On: 02/09/2015 17:40   Scheduled: . sodium chloride   Intravenous Once  . sodium chloride   Intravenous Once  . albuterol  2.5 mg Nebulization Q4H  . antiseptic oral rinse  7 mL Mouth Rinse QID  . chlorhexidine  15 mL Mouth Rinse BID  . citric acid-sodium citrate  30 mL Per Tube BID  . insulin aspart  0-20 Units Subcutaneous 6 times per day  . pantoprazole  40 mg Oral Daily   Or  . pantoprazole (PROTONIX) IV  40 mg Intravenous Daily  . sodium chloride  1,000 mL Intravenous Once     LOS: 1 day   Sophea Rackham C 02/10/2015,12:40 PM  .

## 2015-02-10 NOTE — Progress Notes (Signed)
  Echocardiogram 2D Echocardiogram has been performed.  Darlina Sicilian M 02/10/2015, 3:04 PM

## 2015-02-10 NOTE — Progress Notes (Signed)
MD Florene Glen verbal okay for patient to get PICC line placed.

## 2015-02-10 NOTE — Progress Notes (Signed)
Initial Nutrition Assessment  INTERVENTION: Recommend initiating TF within 24 hours Tube feeding recommendations: Initiate Vital AF 1.2 @ 20 ml/hr via OGT and increase by 10 ml every 4 hours to goal rate of 80 ml/hr.   30 ml Prostat once daily via PEG.    Tube feeding regimen provides 2404 kcal (100% of needs), 159 grams of protein, and 1555 ml of H2O.    NUTRITION DIAGNOSIS:  Inadequate oral intake related to inability to eat as evidenced by NPO status.   GOAL:  Patient will meet greater than or equal to 90% of their needs   MONITOR:  Vent status, TF tolerance, Diet advancement, Weight trends, Skin, I & O's  REASON FOR ASSESSMENT:  Ventilator    ASSESSMENT: 49 y.o. Male with history of HTN and DM, presents as level 1 trauma after a 1.5 ft diameter tree fell on him while he was cutting it.Intubated for airway protection. Right displaced rib fractures 2-9 Moderate right pneumothorax/pleural effusion Right femur fracture Right ankle fracture Right ulna fracture Enlargement of right adrenal gland ?nodule vs hematoma Free fluid of right liver edge ? Small liver laceration  POD#1 R IM nail of the femur, Ex-fix of the R ankle and ORIF of the R Ulna.  Pt on vent, awake and interactive, asking for something to drink. Confirms usual body weight is 230 lbs. OGT to suction with about 700 ml of greenish output noted at time of visit. Pt appears well-nourished.   Patient is currently intubated on ventilator support MV: 10.5 L/min Temp (24hrs), Avg:100.4 F (38 C), Min:98.8 F (37.1 C), Max:101.1 F (38.4 C)  Propofol: none  Labs: high sodium, low calcium, low GFR, low hemoglobin  Height:  Ht Readings from Last 1 Encounters:  02/08/15 6\' 2"  (1.88 m)    Weight:  Wt Readings from Last 1 Encounters:  02/08/15 244 lb 0.8 oz (110.7 kg)    Ideal Body Weight:  86.4 kg  Wt Readings from Last 10 Encounters:  02/08/15 244 lb 0.8 oz (110.7 kg)  admission weight/UBW: 230  lbs (104.5 kg)  BMI:  Body mass index is 29.53 kg/(m^2). (Overweight based on admission wt/UBW)  Estimated Nutritional Needs:  Kcal:  2191-2425  Protein:  155-175 grams  Fluid:  2.9 L/day  Skin:  Wound (see comment) (closed incisions on right leg and right arm)  Diet Order:  Diet NPO time specified  EDUCATION NEEDS:  No education needs identified at this time   Intake/Output Summary (Last 24 hours) at 02/10/15 1416 Last data filed at 02/10/15 1300  Gross per 24 hour  Intake 7922.3 ml  Output   4230 ml  Net 3692.3 ml    Last BM:  PTA  Pryor Ochoa RD, LDN Inpatient Clinical Dietitian Pager: [phone] After Hours Pager: [phone]

## 2015-02-10 NOTE — Progress Notes (Signed)
Peripherally Inserted Central Catheter/Midline Placement  The IV Nurse has discussed with the patient and/or persons authorized to consent for the patient, the purpose of this procedure and the potential benefits and risks involved with this procedure.  The benefits include less needle sticks, lab draws from the catheter and patient may be discharged home with the catheter.  Risks include, but not limited to, infection, bleeding, blood clot (thrombus formation), and puncture of an artery; nerve damage and irregular heat beat.  Alternatives to this procedure were also discussed.  PICC/Midline Placement Documentation  PICC Triple Lumen 68/34/19 PICC Left Basilic 42 cm 0 cm (Active)  Indication for Insertion or Continuance of Line Head or chest injuries (Tracheotomy, burns, open chest wounds) 02/10/2015  6:52 PM  Exposed Catheter (cm) 0 cm 02/10/2015  6:52 PM  Site Assessment Clean;Dry;Intact 02/10/2015  6:52 PM  Lumen #1 Status Flushed;Saline locked;Blood return noted 02/10/2015  6:52 PM  Lumen #2 Status Flushed;Saline locked;Blood return noted 02/10/2015  6:52 PM  Lumen #3 Status Flushed;Saline locked;Blood return noted 02/10/2015  6:52 PM  Dressing Type Transparent;Gauze 02/10/2015  6:52 PM  Dressing Change Due 02/17/15 02/10/2015  6:52 PM       Gordan Payment 02/10/2015, 6:57 PM

## 2015-02-11 ENCOUNTER — Inpatient Hospital Stay (HOSPITAL_COMMUNITY): Payer: Self-pay

## 2015-02-11 LAB — CBC WITH DIFFERENTIAL/PLATELET
Basophils Absolute: 0 10*3/uL (ref 0.0–0.1)
Basophils Relative: 0 % (ref 0–1)
Eosinophils Absolute: 0 10*3/uL (ref 0.0–0.7)
Eosinophils Relative: 0 % (ref 0–5)
HCT: 27 % — ABNORMAL LOW (ref 39.0–52.0)
Hemoglobin: 8.9 g/dL — ABNORMAL LOW (ref 13.0–17.0)
Lymphocytes Relative: 11 % — ABNORMAL LOW (ref 12–46)
Lymphs Abs: 1.1 10*3/uL (ref 0.7–4.0)
MCH: 27.6 pg (ref 26.0–34.0)
MCHC: 33 g/dL (ref 30.0–36.0)
MCV: 83.9 fL (ref 78.0–100.0)
MONOS PCT: 10 % (ref 3–12)
Monocytes Absolute: 0.9 10*3/uL (ref 0.1–1.0)
Neutro Abs: 7.6 10*3/uL (ref 1.7–7.7)
Neutrophils Relative %: 79 % — ABNORMAL HIGH (ref 43–77)
PLATELETS: 105 10*3/uL — AB (ref 150–400)
RBC: 3.22 MIL/uL — AB (ref 4.22–5.81)
RDW: 15 % (ref 11.5–15.5)
WBC: 9.6 10*3/uL (ref 4.0–10.5)

## 2015-02-11 LAB — BASIC METABOLIC PANEL
ANION GAP: 9 (ref 5–15)
BUN: 26 mg/dL — ABNORMAL HIGH (ref 6–20)
CO2: 23 mmol/L (ref 22–32)
CREATININE: 1.39 mg/dL — AB (ref 0.61–1.24)
Calcium: 7.3 mg/dL — ABNORMAL LOW (ref 8.9–10.3)
Chloride: 114 mmol/L — ABNORMAL HIGH (ref 101–111)
GFR, EST NON AFRICAN AMERICAN: 57 mL/min — AB (ref >60)
GLUCOSE: 149 mg/dL — AB (ref 65–99)
Potassium: 4 mmol/L (ref 3.5–5.1)
Sodium: 146 mmol/L — ABNORMAL HIGH (ref 135–145)

## 2015-02-11 LAB — URINALYSIS, ROUTINE W REFLEX MICROSCOPIC
Bilirubin Urine: NEGATIVE
GLUCOSE, UA: 100 mg/dL — AB
KETONES UR: 15 mg/dL — AB
Leukocytes, UA: NEGATIVE
Nitrite: NEGATIVE
PH: 5 (ref 5.0–8.0)
Protein, ur: 100 mg/dL — AB
Specific Gravity, Urine: 1.02 (ref 1.005–1.030)
Urobilinogen, UA: 0.2 mg/dL (ref 0.0–1.0)

## 2015-02-11 LAB — GLUCOSE, CAPILLARY
GLUCOSE-CAPILLARY: 119 mg/dL — AB (ref 65–99)
GLUCOSE-CAPILLARY: 141 mg/dL — AB (ref 65–99)
Glucose-Capillary: 142 mg/dL — ABNORMAL HIGH (ref 65–99)
Glucose-Capillary: 148 mg/dL — ABNORMAL HIGH (ref 65–99)
Glucose-Capillary: 130 mg/dL — ABNORMAL HIGH (ref 65–99)
Glucose-Capillary: 128 mg/dL — ABNORMAL HIGH (ref 65–99)
Glucose-Capillary: 150 mg/dL — ABNORMAL HIGH (ref 65–99)

## 2015-02-11 LAB — URINE MICROSCOPIC-ADD ON

## 2015-02-11 LAB — SODIUM, URINE, RANDOM: Sodium, Ur: 76 mmol/L

## 2015-02-11 LAB — CREATININE, URINE, RANDOM: Creatinine, Urine: 127.71 mg/dL

## 2015-02-11 LAB — TRIGLYCERIDES: Triglycerides: 126 mg/dL (ref <150)

## 2015-02-11 NOTE — Progress Notes (Signed)
PT Cancellation Note  Patient Details Name: Jeremy Burgess MRN: [id number] DOB: Jul 12, 1966   Cancelled Treatment:    Reason Eval/Treat Not Completed: Patient not medically ready. Pt con't to have respiratory complications. RN asked to hold today due to having rough night and desating with turning. PT to return as able.   [name] 02/11/2015, 8:18 AM  [name], PT, DPT Pager #: [phone] Office #: [phone]

## 2015-02-11 NOTE — Progress Notes (Signed)
[facility] Progress Note  Subjective  Intubated but awake Nodding to answer questions  ROS Pain controlled   Objective   BP 128/52 mmHg  Pulse 79  Temp(Src) 101.3 F (38.5 C) (Core (Comment))  Resp 18  Ht 6' 2" (1.88 m)  Wt 110.7 kg (244 lb 0.8 oz)  BMI 31.32 kg/m2  SpO2 94%  Intake/Output      07/01 0701 - 07/02 0700 07/02 0701 - 07/03 0700   I.V. (mL/kg) 3255 (29.4)    Blood     NG/GT 140    IV Piggyback 50    Total Intake(mL/kg) 3445 (31.1)    Urine (mL/kg/hr) 3510 (1.3)    Emesis/NG output 125 (0)    Blood     Chest Tube 310 (0.1)    Total Output 3945     Net -500            Labs  Results for Guercio, Ludwig (MRN [id number]) as of 02/11/2015 09:05  Ref. Range 02/11/2015 04:29  Sodium Latest Ref Range: 135-145 mmol/L 146 (H)  Potassium Latest Ref Range: 3.5-5.1 mmol/L 4.0  Chloride Latest Ref Range: 101-111 mmol/L 114 (H)  CO2 Latest Ref Range: 22-32 mmol/L 23  BUN Latest Ref Range: 6-20 mg/dL 26 (H)  Creatinine Latest Ref Range: 0.61-1.24 mg/dL 1.39 (H)  Calcium Latest Ref Range: 8.9-10.3 mg/dL 7.3 (L)  EGFR (Non-African Amer.) Latest Ref Range: >60 mL/min 57 (L)  EGFR (African American) Latest Ref Range: >60 mL/min >60  Glucose Latest Ref Range: 65-99 mg/dL 149 (H)  Anion gap Latest Ref Range: 5-15  9  Triglycerides Latest Ref Range: <150 mg/dL 126  WBC Latest Ref Range: 4.0-10.5 K/uL 9.6  RBC Latest Ref Range: 4.22-5.81 MIL/uL 3.22 (L)  Hemoglobin Latest Ref Range: 13.0-17.0 g/dL 8.9 (L)  HCT Latest Ref Range: 39.0-52.0 % 27.0 (L)  MCV Latest Ref Range: 78.0-100.0 fL 83.9  MCH Latest Ref Range: 26.0-34.0 pg 27.6  MCHC Latest Ref Range: 30.0-36.0 g/dL 33.0  RDW Latest Ref Range: 11.5-15.5 % 15.0  Platelets Latest Ref Range: 150-400 K/uL 105 (L)   Results for Ruedas, Giuliano (MRN [id number]) as of 02/11/2015 09:05  Ref. Range 02/11/2015 04:29  Neutrophils Latest Ref Range: 43-77 % 79 (H)  Lymphocytes Latest Ref Range: 12-46 % 11 (L)   Monocytes Relative Latest Ref Range: 3-12 % 10  Eosinophil Latest Ref Range: 0-5 % 0  Basophil Latest Ref Range: 0-1 % 0  NEUT# Latest Ref Range: 1.7-7.7 K/uL 7.6  Lymphocyte # Latest Ref Range: 0.7-4.0 K/uL 1.1  Monocyte # Latest Ref Range: 0.1-1.0 K/uL 0.9  Eosinophils Absolute Latest Ref Range: 0.0-0.7 K/uL 0.0  Basophils Absolute Latest Ref Range: 0.0-0.1 K/uL 0.0   Results for Lequire, Hagop (MRN [id number]) as of 02/11/2015 09:05  Ref. Range 02/11/2015 04:30  Appearance Latest Ref Range: CLEAR  CLOUDY (A)  Bacteria, UA Latest Ref Range: RARE  FEW (A)  Bilirubin Urine Latest Ref Range: NEGATIVE  NEGATIVE  Casts Latest Ref Range: NEGATIVE  GRANULAR CAST (A)  Color, Urine Latest Ref Range: YELLOW  AMBER (A)  Crystals Latest Ref Range: NEGATIVE  URIC ACID CRYSTALS (A)  Glucose Latest Ref Range: NEGATIVE mg/dL 100 (A)  Hgb urine dipstick Latest Ref Range: NEGATIVE  LARGE (A)  Ketones, ur Latest Ref Range: NEGATIVE mg/dL 15 (A)  Leukocytes, UA Latest Ref Range: NEGATIVE  NEGATIVE  Nitrite Latest Ref Range: NEGATIVE  NEGATIVE  pH Latest Ref Range: 5.0-8.0  5.0  Protein Latest Ref Range: NEGATIVE   mg/dL 100 (A)  RBC / HPF Latest Ref Range: <3 RBC/hpf 7-10  Specific Gravity, Urine Latest Ref Range: 1.005-1.030  1.020  Urine-Other Unknown AMORPHOUS URATES/...  Urobilinogen, UA Latest Ref Range: 0.0-1.0 mg/dL 0.2    Exam  Gen: intubated but awake, alert and interactive  Lungs: coarse B  Cardiac: s1 and s2 Abd: + BS, NT Pelvis: stable  Ext:       Right Upper Extremity   Dressing c/d/i  R/U/M/AIN/PIN motor intact  R/U/M sens intact  Ext warm  Swelling controlled  + radial pulse        Right Lower Extremity   Dressings stable  Ex fix R ankle stable  Some bloody drainage noted at tibial pin dressings   Moderate swelling still present R ankle   DPN, SPN, TN sensation intact  EHL, FHL, lesser toe motor functions intact  Ext warm, + DP pulse  No pain with passive stretch        Assessment and Plan   POD/HD#: 2  49 y/o male s/p tree injury   1. R femoral shaft fracture   S/p IMN   NWB due to ipsilateral pilon fracture  Dressing changes as needed  Knee and hip ROM as tolerated   2. R pilon fracture   S/p ex fix  CT pending  Delay CT for today due to worsening pulmonary status  Plan for OR Tuesday with Dr. Murphy  Aggressive Ice and elevate on to help with swelling control  Still with moderate swelling present   3.  R ulna fx  S/p ORIF   WBAT thru elbow   Dressing changes as needed   4. DVT/PE prophylaxis  Lovenox while inpt  May want to consider coumadin at dc given constellation of injuries   5. Fever   likley related to pulm issues  U/a + bacteria but neg nitrites and LE  Order urine cx   6. Displaced R rib fx with HPTX  Per CTS   Possible rib plating later this week  7. Medical issues  Per TS  8. Renal failure (acute possibly on chronic disease)  Per renal  9. Dispo  Continue per other services  OR possible Tuesday for ORIF R pilon  CT scan needs to be done pre-op    Keith W. Paul, PA-C [facility] [phone] (O) 02/11/2015 9:04 AM  

## 2015-02-11 NOTE — Progress Notes (Signed)
Pt's foley temperature is 102.2.  MD Boris Lown) notified.  MD to place orders.  I will give prn acetaminophen and apply ice packs.  Will continue to monitor.

## 2015-02-11 NOTE — Progress Notes (Signed)
Patient ID: Jeremy Burgess, male   DOB: [birth year], 49 y.o.   MRN: [id number] Follow up - Trauma and Critical Care  Patient Details:    Jeremy Burgess is an 49 y.o. male.  Lines/tubes : Airway 7.5 mm (Active)  Secured at (cm) 23 cm 02/10/2015  3:32 AM  Measured From Lips 02/10/2015  3:32 AM  Secured Location Center 02/10/2015  3:32 AM  Secured By [employer] 02/10/2015  3:32 AM  Tube Holder Repositioned Yes 02/10/2015  3:32 AM  Cuff Pressure (cm H2O) 24 cm H2O 02/10/2015 12:03 AM  Site Condition Dry 02/10/2015  3:32 AM     CVC Triple Lumen 02/08/15 Left Femoral (Active)  Indication for Insertion or Continuance of Line Head or chest injuries (Tracheotomy, burns, open chest wounds);Vasoactive infusions;Limited venous access - need for IV therapy >5 days (PICC only) 02/10/2015  7:30 AM  Site Assessment Clean;Intact;Dry 02/10/2015  7:30 AM  Proximal Lumen Status Infusing 02/10/2015  7:30 AM  Medial Infusing 02/10/2015  7:30 AM  Distal Lumen Status Infusing 02/10/2015  7:30 AM  Dressing Type Transparent 02/10/2015  7:30 AM  Dressing Status Clean;Dry;Intact 02/10/2015  7:30 AM  Line Care Connections checked and tightened 02/10/2015  7:30 AM  Dressing Change Due 02/15/15 02/10/2015  7:30 AM     Chest Tube 1 Right Pleural 28 Fr. (Active)  Suction To water seal 02/10/2015  8:00 AM  Chest Tube Air Leak None 02/10/2015  8:00 AM  Patency Intervention Tip/tilt 02/08/2015  8:00 PM  Drainage Description Bright red 02/10/2015  8:00 AM  Dressing Status Dry;Intact 02/10/2015  8:00 AM  Dressing Intervention Dressing reinforced 02/08/2015  1:40 PM  Site Assessment Clean;Dry;Intact 02/10/2015  8:00 AM  Surrounding Skin Dry 02/10/2015  8:00 AM  Output (mL) 40 mL 02/10/2015  5:00 AM     Chest Tube 2 Pleural 32 Fr. (Active)  Suction To water seal 02/10/2015  8:00 AM  Chest Tube Air Leak None 02/10/2015  8:00 AM  Patency Intervention Tip/tilt 02/08/2015  8:00 PM  Drainage Description Bright red 02/10/2015  8:00 AM  Dressing Status Dry;Intact  02/10/2015  8:00 AM  Dressing Intervention Dressing reinforced 02/08/2015  1:40 PM  Site Assessment Clean;Dry;Intact 02/10/2015  8:00 AM  Surrounding Skin Dry 02/10/2015  8:00 AM  Output (mL) 120 mL 02/10/2015  5:00 AM     NG/OG Tube Orogastric Center mouth (Active)  Placement Verification Auscultation 02/09/2015  8:00 AM  Site Assessment Clean;Dry;Intact 02/10/2015  8:00 AM  Status Suction-low intermittent 02/10/2015  8:00 AM  Drainage Appearance Bile 02/10/2015  8:00 AM  Output (mL) 100 mL 02/10/2015  6:00 AM     Urethral Catheter Jeremy Burgess Straight-tip;Temperature probe 16 Fr. (Active)  Indication for Insertion or Continuance of Catheter Unstable critical patients (first 24-48 hours) 02/09/2015  8:00 AM  Site Assessment Intact;Clean 02/10/2015  8:00 AM  Catheter Maintenance Bag below level of bladder;Drainage bag/tubing not touching floor;Insertion date on drainage bag;No dependent loops;Seal intact;Bag emptied prior to transport;Catheter secured 02/10/2015  8:00 AM  Collection Container Standard drainage bag 02/10/2015  8:00 AM  Securement Method Securing device (Describe) 02/10/2015  8:00 AM  Output (mL) 300 mL 02/10/2015  8:00 AM    Microbiology/Sepsis markers: Results for orders placed or performed during the hospital encounter of 02/08/15  MRSA PCR Screening     Status: None   Collection Time: 02/08/15  3:37 PM  Result Value Ref Range Status   MRSA by PCR NEGATIVE NEGATIVE Final    Comment:  The GeneXpert MRSA Assay (FDA approved for NASAL specimens only), is one component of a comprehensive MRSA colonization surveillance program. It is not intended to diagnose MRSA infection nor to guide or monitor treatment for MRSA infections.   Culture, respiratory (NON-Expectorated)     Status: None (Preliminary result)   Collection Time: 02/09/15  7:40 AM  Result Value Ref Range Status   Specimen Description TRACHEAL ASPIRATE  Final   Special Requests NONE  Final   Gram Stain   Final    FEW WBC  PRESENT, PREDOMINANTLY MONONUCLEAR RARE SQUAMOUS EPITHELIAL CELLS PRESENT FEW GRAM POSITIVE COCCI IN PAIRS IN CLUSTERS Performed at [facility]    Culture   Final    Culture reincubated for better growth Performed at [facility]    Report Status PENDING  Incomplete    Anti-infectives:  Anti-infectives    Start     Dose/Rate Route Frequency Ordered Stop   02/09/15 2030  ceFAZolin (ANCEF) IVPB 2 g/50 mL premix     2 g 100 mL/hr over 30 Minutes Intravenous Every 6 hours 02/09/15 1841 02/10/15 0910   02/09/15 1230  ceFAZolin (ANCEF) IVPB 2 g/50 mL premix     2 g 100 mL/hr over 30 Minutes Intravenous To [facility] 02/08/15 1418 02/09/15 1425      Best Practice/Protocols:  VTE Prophylaxis: Mechanical GI Prophylaxis: Proton Pump Inhibitor Continous Sedation  Consults: Treatment Team:  Trauma Md, MD [name], MD Ivin Poot, MD Estanislado Emms, MD    Events:  Subjective:    Overnight Issues: Creatinine coming back down. Having some desaturations.    Objective:  Vital signs for last 24 hours: Temp:  [100.6 F (38.1 C)-101.7 F (38.7 C)] 101.3 F (38.5 C) (07/02 0700) Pulse Rate:  [70-91] 79 (07/02 0700) Resp:  [6-23] 18 (07/02 0700) BP: (92-166)/(51-88) 128/52 mmHg (07/02 0700) SpO2:  [75 %-100 %] 94 % (07/02 0716) Arterial Line BP: (99-193)/(50-96) 131/52 mmHg (07/02 0700) FiO2 (%):  [40 %-100 %] 60 % (07/02 0716)  Hemodynamic parameters for last 24 hours:    Intake/Output from previous day: 07/01 0701 - 07/02 0700 In: 3445 [I.V.:3255; NG/GT:140; IV Piggyback:50] Out: 0388 [Urine:3510; Emesis/NG output:125; Chest Tube:310]  Intake/Output this shift:    Vent settings for last 24 hours: Vent Mode:  [-] PRVC FiO2 (%):  [40 %-100 %] 60 % Set Rate:  [20 bmp] 20 bmp Vt Set:  [520 mL] 520 mL PEEP:  [5 cmH20] 5 cmH20 Plateau Pressure:  [20 cmH20-23 cmH20] 23 cmH20  Physical Exam:  General: alert, able to follow commands  and interact.   Neuro: alert, oriented and moving all extremities.   Sensory intact BLE.  Good motor function in right leg.   Resp: coarse sounds bilaterally, decreased at left base.  CVS: regular rate and rhythm, S1, S2 normal, no murmur, click, rub or gallop GI: Soft, nontender.  Non distended.   Extremities: right below knee extremity in external fixator.    Results for orders placed or performed during the hospital encounter of 02/08/15 (from the past 24 hour(s))  Glucose, capillary     Status: Abnormal   Collection Time: 02/10/15 11:41 AM  Result Value Ref Range   Glucose-Capillary 135 (H) 65 - 99 mg/dL   Comment 1 Document in Chart   Glucose, capillary     Status: Abnormal   Collection Time: 02/10/15  4:19 PM  Result Value Ref Range   Glucose-Capillary 101 (H) 65 - 99 mg/dL  Comment 1 Notify RN   Glucose, capillary     Status: Abnormal   Collection Time: 02/10/15  7:11 PM  Result Value Ref Range   Glucose-Capillary 124 (H) 65 - 99 mg/dL  Glucose, capillary     Status: Abnormal   Collection Time: 02/11/15 12:02 AM  Result Value Ref Range   Glucose-Capillary 119 (H) 65 - 99 mg/dL  Glucose, capillary     Status: Abnormal   Collection Time: 02/11/15  3:57 AM  Result Value Ref Range   Glucose-Capillary 142 (H) 65 - 99 mg/dL  CBC with Differential/Platelet     Status: Abnormal   Collection Time: 02/11/15  4:29 AM  Result Value Ref Range   WBC 9.6 4.0 - 10.5 K/uL   RBC 3.22 (L) 4.22 - 5.81 MIL/uL   Hemoglobin 8.9 (L) 13.0 - 17.0 g/dL   HCT 27.0 (L) 39.0 - 52.0 %   MCV 83.9 78.0 - 100.0 fL   MCH 27.6 26.0 - 34.0 pg   MCHC 33.0 30.0 - 36.0 g/dL   RDW 15.0 11.5 - 15.5 %   Platelets 105 (L) 150 - 400 K/uL   Neutrophils Relative % 79 (H) 43 - 77 %   Neutro Abs 7.6 1.7 - 7.7 K/uL   Lymphocytes Relative 11 (L) 12 - 46 %   Lymphs Abs 1.1 0.7 - 4.0 K/uL   Monocytes Relative 10 3 - 12 %   Monocytes Absolute 0.9 0.1 - 1.0 K/uL   Eosinophils Relative 0 0 - 5 %   Eosinophils  Absolute 0.0 0.0 - 0.7 K/uL   Basophils Relative 0 0 - 1 %   Basophils Absolute 0.0 0.0 - 0.1 K/uL  Basic metabolic panel     Status: Abnormal   Collection Time: 02/11/15  4:29 AM  Result Value Ref Range   Sodium 146 (H) 135 - 145 mmol/L   Potassium 4.0 3.5 - 5.1 mmol/L   Chloride 114 (H) 101 - 111 mmol/L   CO2 23 22 - 32 mmol/L   Glucose, Bld 149 (H) 65 - 99 mg/dL   BUN 26 (H) 6 - 20 mg/dL   Creatinine, Ser 1.39 (H) 0.61 - 1.24 mg/dL   Calcium 7.3 (L) 8.9 - 10.3 mg/dL   GFR calc non Af Amer 57 (L) >60 mL/min   GFR calc Af Amer >60 >60 mL/min   Anion gap 9 5 - 15  Triglycerides     Status: None   Collection Time: 02/11/15  4:29 AM  Result Value Ref Range   Triglycerides 126 <150 mg/dL  Urinalysis, Routine w reflex microscopic (not at [facility])     Status: Abnormal   Collection Time: 02/11/15  4:30 AM  Result Value Ref Range   Color, Urine AMBER (A) YELLOW   APPearance CLOUDY (A) CLEAR   Specific Gravity, Urine 1.020 1.005 - 1.030   pH 5.0 5.0 - 8.0   Glucose, UA 100 (A) NEGATIVE mg/dL   Hgb urine dipstick LARGE (A) NEGATIVE   Bilirubin Urine NEGATIVE NEGATIVE   Ketones, ur 15 (A) NEGATIVE mg/dL   Protein, ur 100 (A) NEGATIVE mg/dL   Urobilinogen, UA 0.2 0.0 - 1.0 mg/dL   Nitrite NEGATIVE NEGATIVE   Leukocytes, UA NEGATIVE NEGATIVE  Sodium, urine, random     Status: None   Collection Time: 02/11/15  4:30 AM  Result Value Ref Range   Sodium, Ur 76 mmol/L  Creatinine, urine, random     Status: None   Collection Time: 02/11/15  4:30 AM  Result Value Ref Range   Creatinine, Urine 127.71 mg/dL  Urine microscopic-add on     Status: Abnormal   Collection Time: 02/11/15  4:30 AM  Result Value Ref Range   RBC / HPF 7-10 <3 RBC/hpf   Bacteria, UA FEW (A) RARE   Casts GRANULAR CAST (A) NEGATIVE   Crystals URIC ACID CRYSTALS (A) NEGATIVE   Urine-Other AMORPHOUS URATES/PHOSPHATES      Assessment/Plan:   NEURO  Altered Mental Status:  sedation and but still responsive   Plan:  Wean sedation as tolerated.  PULM  Atelectasis/collapse (focal and right lung) Chest Wall Trauma multiple rib fractures and Lung Trauma (right and with contusion of lung).  Recurrent PTX on chest xray this am in conjunction with desaturations.     Plan: Continue to support.  Continue bronchodilator for wheezing.  Place chest tubes back to -20 cm H20 suction.  Increase PEEP.    CARDIO  No specific issues   Plan: CPM  RENAL  Urine output has been good   Plan: Appears to be spontaneously diuresing.    GI  No specific issues.   Plan: Continue tube feeds at goal.    ID  No known infectious sources   Plan: CPM  HEME  Anemia acute blood loss anemia and has improved and stabilized.)   Plan: Stable.    ENDO Hyperglycemia   Plan: Continues to require coverage 2-3 times per day.    Global Issues  Worse from pulmonary standpoint.  Likely secondary to secretions and small right apical PTX.  Hopefully placing chest tubes back to suction and increasing PEEP will help.   Neurologically the patient is intact.  Orthopedically he will need more surgery on his right ankle to remove external fixator.  Will hold on ankle CT until probably tomorrow secondary to desats.     LOS: 2 days   Additional comments:I reviewed the patient's new clinical lab test results. cbc/bmet and I reviewed the patients new imaging test results. cxr  Critical Care Total Time*: 30 Minutes  Pieter Fooks 02/11/2015  *Care during the described time interval was provided by me and/or other providers on the critical care team.  I have reviewed this patient's available data, including medical history, events of note, physical examination and test results as part of my evaluation.

## 2015-02-12 ENCOUNTER — Inpatient Hospital Stay (HOSPITAL_COMMUNITY): Payer: Self-pay

## 2015-02-12 LAB — TYPE AND SCREEN
ABO/RH(D): A POS
ANTIBODY SCREEN: NEGATIVE
UNIT DIVISION: 0
UNIT DIVISION: 0
Unit division: 0
Unit division: 0
Unit division: 0
Unit division: 0
Unit division: 0
Unit division: 0
Unit division: 0

## 2015-02-12 LAB — COMPREHENSIVE METABOLIC PANEL
ALBUMIN: 2.1 g/dL — AB (ref 3.5–5.0)
ALK PHOS: 43 U/L (ref 38–126)
ALT: 85 U/L — AB (ref 17–63)
ANION GAP: 7 (ref 5–15)
AST: 99 U/L — ABNORMAL HIGH (ref 15–41)
BUN: 25 mg/dL — ABNORMAL HIGH (ref 6–20)
CO2: 25 mmol/L (ref 22–32)
Calcium: 7.8 mg/dL — ABNORMAL LOW (ref 8.9–10.3)
Chloride: 114 mmol/L — ABNORMAL HIGH (ref 101–111)
Creatinine, Ser: 1.13 mg/dL (ref 0.61–1.24)
GFR calc non Af Amer: >60 mL/min (ref >60)
GLUCOSE: 179 mg/dL — AB (ref 65–99)
POTASSIUM: 3.9 mmol/L (ref 3.5–5.1)
Sodium: 146 mmol/L — ABNORMAL HIGH (ref 135–145)
Total Bilirubin: 1 mg/dL (ref 0.3–1.2)
Total Protein: 4.8 g/dL — ABNORMAL LOW (ref 6.5–8.1)

## 2015-02-12 LAB — URINALYSIS, ROUTINE W REFLEX MICROSCOPIC
Bilirubin Urine: NEGATIVE
Glucose, UA: 250 mg/dL — AB
Ketones, ur: NEGATIVE mg/dL
LEUKOCYTES UA: NEGATIVE
Nitrite: NEGATIVE
PH: 5.5 (ref 5.0–8.0)
PROTEIN: 100 mg/dL — AB
SPECIFIC GRAVITY, URINE: 1.021 (ref 1.005–1.030)
UROBILINOGEN UA: 1 mg/dL (ref 0.0–1.0)

## 2015-02-12 LAB — GLUCOSE, CAPILLARY
GLUCOSE-CAPILLARY: 142 mg/dL — AB (ref 65–99)
GLUCOSE-CAPILLARY: 144 mg/dL — AB (ref 65–99)
GLUCOSE-CAPILLARY: 149 mg/dL — AB (ref 65–99)
Glucose-Capillary: 150 mg/dL — ABNORMAL HIGH (ref 65–99)
Glucose-Capillary: 162 mg/dL — ABNORMAL HIGH (ref 65–99)
Glucose-Capillary: 171 mg/dL — ABNORMAL HIGH (ref 65–99)

## 2015-02-12 LAB — CULTURE, RESPIRATORY W GRAM STAIN

## 2015-02-12 LAB — CBC
HCT: 25.3 % — ABNORMAL LOW (ref 39.0–52.0)
HEMOGLOBIN: 8.5 g/dL — AB (ref 13.0–17.0)
MCH: 28.3 pg (ref 26.0–34.0)
MCHC: 33.6 g/dL (ref 30.0–36.0)
MCV: 84.3 fL (ref 78.0–100.0)
PLATELETS: 112 10*3/uL — AB (ref 150–400)
RBC: 3 MIL/uL — ABNORMAL LOW (ref 4.22–5.81)
RDW: 14.9 % (ref 11.5–15.5)
WBC: 9.1 10*3/uL (ref 4.0–10.5)

## 2015-02-12 LAB — URINE CULTURE: CULTURE: NO GROWTH

## 2015-02-12 LAB — CULTURE, RESPIRATORY

## 2015-02-12 LAB — URINE MICROSCOPIC-ADD ON

## 2015-02-12 MED ORDER — VANCOMYCIN HCL 10 G IV SOLR
1250.0000 mg | Freq: Two times a day (BID) | INTRAVENOUS | Status: DC
  Administered 2015-02-12 – 2015-02-13 (×4): 1250 mg via INTRAVENOUS
  Filled 2015-02-12 (×6): qty 1250

## 2015-02-12 MED ORDER — PIPERACILLIN-TAZOBACTAM 3.375 G IVPB
3.3750 g | Freq: Three times a day (TID) | INTRAVENOUS | Status: DC
  Administered 2015-02-12 – 2015-02-14 (×6): 3.375 g via INTRAVENOUS
  Filled 2015-02-12 (×8): qty 50

## 2015-02-12 MED ORDER — INSULIN GLARGINE 100 UNIT/ML ~~LOC~~ SOLN
4.0000 [IU] | Freq: Every day | SUBCUTANEOUS | Status: DC
  Administered 2015-02-12: 4 [IU] via SUBCUTANEOUS
  Filled 2015-02-12 (×2): qty 0.04

## 2015-02-12 NOTE — Progress Notes (Signed)
Patient ID: Jeremy Burgess, male   DOB: 1966/05/15, 49 y.o.   MRN: [id number] Follow up - Trauma and Critical Care  Patient Details:    Jeremy Burgess is an 49 y.o. male.  Lines/tubes : Airway 7.5 mm (Active)  Secured at (cm) 23 cm 02/10/2015  3:32 AM  Measured From Lips 02/10/2015  3:32 AM  Secured Location Center 02/10/2015  3:32 AM  Secured By [employer] 02/10/2015  3:32 AM  Tube Holder Repositioned Yes 02/10/2015  3:32 AM  Cuff Pressure (cm H2O) 24 cm H2O 02/10/2015 12:03 AM  Site Condition Dry 02/10/2015  3:32 AM     CVC Triple Lumen 02/08/15 Left Femoral (Active)  Indication for Insertion or Continuance of Line Head or chest injuries (Tracheotomy, burns, open chest wounds);Vasoactive infusions;Limited venous access - need for IV therapy >5 days (PICC only) 02/10/2015  7:30 AM  Site Assessment Clean;Intact;Dry 02/10/2015  7:30 AM  Proximal Lumen Status Infusing 02/10/2015  7:30 AM  Medial Infusing 02/10/2015  7:30 AM  Distal Lumen Status Infusing 02/10/2015  7:30 AM  Dressing Type Transparent 02/10/2015  7:30 AM  Dressing Status Clean;Dry;Intact 02/10/2015  7:30 AM  Line Care Connections checked and tightened 02/10/2015  7:30 AM  Dressing Change Due 02/15/15 02/10/2015  7:30 AM     Chest Tube 1 Right Pleural 28 Fr. (Active)  Suction To water seal 02/10/2015  8:00 AM  Chest Tube Air Leak None 02/10/2015  8:00 AM  Patency Intervention Tip/tilt 02/08/2015  8:00 PM  Drainage Description Bright red 02/10/2015  8:00 AM  Dressing Status Dry;Intact 02/10/2015  8:00 AM  Dressing Intervention Dressing reinforced 02/08/2015  1:40 PM  Site Assessment Clean;Dry;Intact 02/10/2015  8:00 AM  Surrounding Skin Dry 02/10/2015  8:00 AM  Output (mL) 40 mL 02/10/2015  5:00 AM     Chest Tube 2 Pleural 32 Fr. (Active)  Suction To water seal 02/10/2015  8:00 AM  Chest Tube Air Leak None 02/10/2015  8:00 AM  Patency Intervention Tip/tilt 02/08/2015  8:00 PM  Drainage Description Bright red 02/10/2015  8:00 AM  Dressing Status Dry;Intact  02/10/2015  8:00 AM  Dressing Intervention Dressing reinforced 02/08/2015  1:40 PM  Site Assessment Clean;Dry;Intact 02/10/2015  8:00 AM  Surrounding Skin Dry 02/10/2015  8:00 AM  Output (mL) 120 mL 02/10/2015  5:00 AM     NG/OG Tube Orogastric Center mouth (Active)  Placement Verification Auscultation 02/09/2015  8:00 AM  Site Assessment Clean;Dry;Intact 02/10/2015  8:00 AM  Status Suction-low intermittent 02/10/2015  8:00 AM  Drainage Appearance Bile 02/10/2015  8:00 AM  Output (mL) 100 mL 02/10/2015  6:00 AM     Urethral Catheter Jeremy Burgess Straight-tip;Temperature probe 16 Fr. (Active)  Indication for Insertion or Continuance of Catheter Unstable critical patients (first 24-48 hours) 02/09/2015  8:00 AM  Site Assessment Intact;Clean 02/10/2015  8:00 AM  Catheter Maintenance Bag below level of bladder;Drainage bag/tubing not touching floor;Insertion date on drainage bag;No dependent loops;Seal intact;Bag emptied prior to transport;Catheter secured 02/10/2015  8:00 AM  Collection Container Standard drainage bag 02/10/2015  8:00 AM  Securement Method Securing device (Describe) 02/10/2015  8:00 AM  Output (mL) 300 mL 02/10/2015  8:00 AM    Microbiology/Sepsis markers: Results for orders placed or performed during the hospital encounter of 02/08/15  MRSA PCR Screening     Status: None   Collection Time: 02/08/15  3:37 PM  Result Value Ref Range Status   MRSA by PCR NEGATIVE NEGATIVE Final    Comment:  The GeneXpert MRSA Assay (FDA approved for NASAL specimens only), is one component of a comprehensive MRSA colonization surveillance program. It is not intended to diagnose MRSA infection nor to guide or monitor treatment for MRSA infections.   Culture, respiratory (NON-Expectorated)     Status: None (Preliminary result)   Collection Time: 02/09/15  7:40 AM  Result Value Ref Range Status   Specimen Description TRACHEAL ASPIRATE  Final   Special Requests NONE  Final   Gram Stain   Final    FEW WBC  PRESENT, PREDOMINANTLY MONONUCLEAR RARE SQUAMOUS EPITHELIAL CELLS PRESENT FEW GRAM POSITIVE COCCI IN PAIRS IN CLUSTERS Performed at [facility]    Culture   Final    MODERATE STAPHYLOCOCCUS AUREUS Note: RIFAMPIN AND GENTAMICIN SHOULD NOT BE USED AS SINGLE DRUGS FOR TREATMENT OF STAPH INFECTIONS. Performed at [facility]    Report Status PENDING  Incomplete    Anti-infectives:  Anti-infectives    Start     Dose/Rate Route Frequency Ordered Stop   02/09/15 2030  ceFAZolin (ANCEF) IVPB 2 g/50 mL premix     2 g 100 mL/hr over 30 Minutes Intravenous Every 6 hours 02/09/15 1841 02/10/15 0910   02/09/15 1230  ceFAZolin (ANCEF) IVPB 2 g/50 mL premix     2 g 100 mL/hr over 30 Minutes Intravenous To [facility] 02/08/15 1418 02/09/15 1425      Best Practice/Protocols:  VTE Prophylaxis: Mechanical GI Prophylaxis: Proton Pump Inhibitor Continous Sedation  Consults: Treatment Team:  Trauma Md, MD [name], MD Ivin Poot, MD    Events:  Subjective:    Overnight Issues: Chest tubes placed back to suction yesterday.  Desat episodes decreased in frequency and were only occuring with positioning.  Patient did have fevers last night and increase in respiratory secretions.     Objective:  Vital signs for last 24 hours: Temp:  [100 F (37.8 C)-102.4 F (39.1 C)] 100 F (37.8 C) (07/03 0600) Pulse Rate:  [65-87] 66 (07/03 0700) Resp:  [13-22] 20 (07/03 0700) BP: (108-149)/(48-68) 134/60 mmHg (07/03 0700) SpO2:  [93 %-100 %] 100 % (07/03 0758) Arterial Line BP: (118-182)/(48-109) 155/57 mmHg (07/03 0700) FiO2 (%):  [40 %-60 %] 40 % (07/03 0758) Weight:  [116.2 kg (256 lb 2.8 oz)] 116.2 kg (256 lb 2.8 oz) (07/03 0400)  Hemodynamic parameters for last 24 hours:    Intake/Output from previous day: 07/02 0701 - 07/03 0700 In: 3510 [I.V.:2780; NG/GT:730] Out: 2390 [Urine:2020; Chest Tube:370]  Intake/Output this shift:    Vent settings  for last 24 hours: Vent Mode:  [-] PRVC FiO2 (%):  [40 %-60 %] 40 % Set Rate:  [20 bmp] 20 bmp Vt Set:  [520 mL] 520 mL PEEP:  [8 cmH20] 8 cmH20 Plateau Pressure:  [25 ALP37-90 cmH20] 27 cmH20  Physical Exam:  General: alert, able to follow commands and interact.   Neuro: alert, oriented and moving all extremities to command.   Sensory intact BLE.  Good motor function in both legs.    Resp: coarse sounds bilaterally, decreased at left base.  CVS: regular rate and rhythm GI: Soft, nontender.  Non distended.   Extremities: right below knee extremity in external fixator.    Results for orders placed or performed during the hospital encounter of 02/08/15 (from the past 24 hour(s))  Glucose, capillary     Status: Abnormal   Collection Time: 02/11/15 11:38 AM  Result Value Ref Range   Glucose-Capillary 141 (H) 65 -  99 mg/dL  Glucose, capillary     Status: Abnormal   Collection Time: 02/11/15  3:43 PM  Result Value Ref Range   Glucose-Capillary 130 (H) 65 - 99 mg/dL  Glucose, capillary     Status: Abnormal   Collection Time: 02/11/15  7:16 PM  Result Value Ref Range   Glucose-Capillary 128 (H) 65 - 99 mg/dL  Glucose, capillary     Status: Abnormal   Collection Time: 02/11/15 11:21 PM  Result Value Ref Range   Glucose-Capillary 150 (H) 65 - 99 mg/dL  Glucose, capillary     Status: Abnormal   Collection Time: 02/12/15  3:27 AM  Result Value Ref Range   Glucose-Capillary 142 (H) 65 - 99 mg/dL  CBC     Status: Abnormal   Collection Time: 02/12/15  5:20 AM  Result Value Ref Range   WBC 9.1 4.0 - 10.5 K/uL   RBC 3.00 (L) 4.22 - 5.81 MIL/uL   Hemoglobin 8.5 (L) 13.0 - 17.0 g/dL   HCT 25.3 (L) 39.0 - 52.0 %   MCV 84.3 78.0 - 100.0 fL   MCH 28.3 26.0 - 34.0 pg   MCHC 33.6 30.0 - 36.0 g/dL   RDW 14.9 11.5 - 15.5 %   Platelets 112 (L) 150 - 400 K/uL  Comprehensive metabolic panel     Status: Abnormal   Collection Time: 02/12/15  5:20 AM  Result Value Ref Range   Sodium 146 (H) 135  - 145 mmol/L   Potassium 3.9 3.5 - 5.1 mmol/L   Chloride 114 (H) 101 - 111 mmol/L   CO2 25 22 - 32 mmol/L   Glucose, Bld 179 (H) 65 - 99 mg/dL   BUN 25 (H) 6 - 20 mg/dL   Creatinine, Ser 1.13 0.61 - 1.24 mg/dL   Calcium 7.8 (L) 8.9 - 10.3 mg/dL   Total Protein 4.8 (L) 6.5 - 8.1 g/dL   Albumin 2.1 (L) 3.5 - 5.0 g/dL   AST 99 (H) 15 - 41 U/L   ALT 85 (H) 17 - 63 U/L   Alkaline Phosphatase 43 38 - 126 U/L   Total Bilirubin 1.0 0.3 - 1.2 mg/dL   GFR calc non Af Amer >60 >60 mL/min   GFR calc Af Amer >60 >60 mL/min   Anion gap 7 5 - 15  Glucose, capillary     Status: Abnormal   Collection Time: 02/12/15  7:58 AM  Result Value Ref Range   Glucose-Capillary 144 (H) 65 - 99 mg/dL     Assessment/Plan:   NEURO  Altered Mental Status:  sedation and but still responsive   Plan: Wean sedation as tolerated.  PULM  Atelectasis/collapse (focal and right lung) Chest Wall Trauma multiple rib fractures and Lung Trauma (right and with contusion of lung).  PTX improved significantly    Plan: Continue ventilator support.  Continue bronchodilator for wheezing.  Continue chest tubes to suction today.  Consider water seal again tomorrow.  Concerned about pneumonia/tracheobronchitis today given increased secretions and fevers.    CARDIO  No specific issues   Plan: CPM  RENAL  Urine output has been good.  Acute kidney injury.     Plan: Continued spontaneous diuresis.  Kidney injury appears resolved.    GI/FEN  No specific issues. Needs nutritional support.   Plan: Continue tube feeds at goal.    ID  Obtain cultures today.     Plan: start antibiotics today empirically after cultures obtained.    HEME  Anemia acute  blood loss anemia and has improved and stabilized.)   Plan: Stable.    ENDO Hyperglycemia   Plan: Continues to require coverage 2-3 times per day.  Add low dose lantus.    Global Issues   Neurologically the patient is intact.  Orthopedically he will need more surgery on his right  ankle to remove external fixator.  OK to get ankle Ct.  Will get cultures today.  Patient can likely be more aggressively weaned post op.     LOS: 3 days   Additional comments:I reviewed the patient's new clinical lab test results. cbc/bmet and I reviewed the patients new imaging test results. cxr  Critical Care Total Time*: 30 Minutes  [place] Shannahan 02/12/2015  *Care during the described time interval was provided by me and/or other providers on the critical care team.  I have reviewed this patient's available data, including medical history, events of note, physical examination and test results as part of my evaluation.

## 2015-02-12 NOTE — Progress Notes (Signed)
PT Cancellation Note  Patient Details Name: Jeremy Burgess MRN: [id number] DOB: 01/13/1966   Cancelled Treatment:    Reason Eval/Treat Not Completed: Patient not medically ready. Pt remains intubated. RN deferred due to pt con't to desat into 70s with any bed mobility ie laying flat or turning. PT to return as able.   [name] 02/12/2015, 7:06 AM   [name], PT, DPT Pager #: [phone] Office #: [phone]

## 2015-02-12 NOTE — Progress Notes (Addendum)
ANTIBIOTIC CONSULT NOTE - INITIAL  Pharmacy Consult for Zosyn, Vancomycin  Indication: Tracheobronchitis  Allergies  Allergen Reactions  . Bee Venom Other (See Comments)    intolerance    Patient Measurements: Height: 6\' 2"  (188 cm) Weight: 256 lb 2.8 oz (116.2 kg) IBW/kg (Calculated) : 82.2  Vital Signs: Temp: 100 F (37.8 C) (07/03 0600) Temp Source: Core (Comment) (07/03 0400) BP: 167/61 mmHg (07/03 0835) Pulse Rate: 70 (07/03 0835) Intake/Output from previous day: 07/02 0701 - 07/03 0700 In: 3510 [I.V.:2780; NG/GT:730] Out: 2390 [Urine:2020; Chest Tube:370] Intake/Output from this shift:    Labs:  Recent Labs  02/10/15 0549 02/11/15 0429 02/11/15 0430 02/12/15 0520  WBC 8.1 9.6  --  9.1  HGB 9.3* 8.9*  --  8.5*  PLT 102* 105*  --  112*  LABCREA  --   --  127.71  --   CREATININE 1.89* 1.39*  --  1.13   Estimated Creatinine Clearance: 104.8 mL/min (by C-G formula based on Cr of 1.13). No results for input(s): VANCOTROUGH, VANCOPEAK, VANCORANDOM, GENTTROUGH, GENTPEAK, GENTRANDOM, TOBRATROUGH, TOBRAPEAK, TOBRARND, AMIKACINPEAK, AMIKACINTROU, AMIKACIN in the last 72 hours.   Microbiology: Recent Results (from the past 720 hour(s))  MRSA PCR Screening     Status: None   Collection Time: 02/08/15  3:37 PM  Result Value Ref Range Status   MRSA by PCR NEGATIVE NEGATIVE Final    Comment:        The GeneXpert MRSA Assay (FDA approved for NASAL specimens only), is one component of a comprehensive MRSA colonization surveillance program. It is not intended to diagnose MRSA infection nor to guide or monitor treatment for MRSA infections.   Culture, respiratory (NON-Expectorated)     Status: None (Preliminary result)   Collection Time: 02/09/15  7:40 AM  Result Value Ref Range Status   Specimen Description TRACHEAL ASPIRATE  Final   Special Requests NONE  Final   Gram Stain   Final    FEW WBC PRESENT, PREDOMINANTLY MONONUCLEAR RARE SQUAMOUS EPITHELIAL CELLS  PRESENT FEW GRAM POSITIVE COCCI IN PAIRS IN CLUSTERS Performed at [facility]    Culture   Final    MODERATE STAPHYLOCOCCUS AUREUS Note: RIFAMPIN AND GENTAMICIN SHOULD NOT BE USED AS SINGLE DRUGS FOR TREATMENT OF STAPH INFECTIONS. Performed at [facility]    Report Status PENDING  Incomplete    Medical History: Past Medical History  Diagnosis Date  . Diabetes mellitus   . Hypertension     Assessment: 49 year old male starting on Zosyn and Vancomycin for tracheobronchitis for now Await sensitivity of cultures - MSSA Previously on Ancef  Goal of Therapy:  Appropriate dosing   Plan:  Zosyn 3.375 grams iv Q 8 hours - 4 hr infusion  Vancomycin 1250 mg iv Q 12 hours Follow up Scr, cultures, LOT  Thank you Anette Guarneri, PharmD   02/13/15 11:30 AM

## 2015-02-12 NOTE — Progress Notes (Signed)
3 Days Post-Op Procedure(s) (LRB): INTRAMEDULLARY (IM) RETROGRADE FEMORAL NAILING (Right) EXTERNAL FIXATION LEG (Right) OPEN REDUCTION INTERNAL FIXATION (ORIF) ULNAR FRACTURE (Right) Subjective: CXR improved with chest tubes to suction With fever 102 and staph in tracheal aspirate will add vanco to iv Zosyn Would leave intubated until RLE ORIF is performed  Objective: Vital signs in last 24 hours: Temp:  [100 F (37.8 C)-102.4 F (39.1 C)] 100 F (37.8 C) (07/03 1000) Pulse Rate:  [65-87] 66 (07/03 1000) Cardiac Rhythm:  [-] Normal sinus rhythm (07/03 0800) Resp:  [13-22] 20 (07/03 1000) BP: (108-167)/(48-68) 115/55 mmHg (07/03 1000) SpO2:  [93 %-100 %] 98 % (07/03 1000) Arterial Line BP: (118-189)/(48-109) 127/54 mmHg (07/03 1000) FiO2 (%):  [40 %-60 %] 40 % (07/03 1000) Weight:  [256 lb 2.8 oz (116.2 kg)] 256 lb 2.8 oz (116.2 kg) (07/03 0400)  Hemodynamic parameters for last 24 hours:  nsr  Intake/Output from previous day: 07/02 0701 - 07/03 0700 In: 3510 [I.V.:2780; NG/GT:730] Out: 2390 [Urine:2020; Chest Tube:370] Intake/Output this shift: Total I/O In: 435 [I.V.:345; NG/GT:90] Out: 325 [Urine:325]  No air leak or sig drainage from chest tubes  Lab Results:  Recent Labs  02/11/15 0429 02/12/15 0520  WBC 9.6 9.1  HGB 8.9* 8.5*  HCT 27.0* 25.3*  PLT 105* 112*   BMET:  Recent Labs  02/11/15 0429 02/12/15 0520  NA 146* 146*  K 4.0 3.9  CL 114* 114*  CO2 23 25  GLUCOSE 149* 179*  BUN 26* 25*  CREATININE 1.39* 1.13  CALCIUM 7.3* 7.8*    PT/INR: No results for input(s): LABPROT, INR in the last 72 hours. ABG    Component Value Date/Time   PHART 7.322* 02/08/2015 1435   HCO3 21.0 02/08/2015 1435   TCO2 22.3 02/08/2015 1435   ACIDBASEDEF 4.0* 02/08/2015 1435   O2SAT 98.9 02/08/2015 1435   CBG (last 3)   Recent Labs  02/11/15 2321 02/12/15 0327 02/12/15 0758  GLUCAP 150* 142* 144*    Assessment/Plan: S/P Procedure(s) (LRB): INTRAMEDULLARY  (IM) RETROGRADE FEMORAL NAILING (Right) EXTERNAL FIXATION LEG (Right) OPEN REDUCTION INTERNAL FIXATION (ORIF) ULNAR FRACTURE (Right) keep intubated  add IV vanco per pharmD Start TF nutrition     LOS: 3 days    Jeremy Burgess 02/12/2015

## 2015-02-13 ENCOUNTER — Inpatient Hospital Stay (HOSPITAL_COMMUNITY): Payer: Self-pay

## 2015-02-13 LAB — GLUCOSE, CAPILLARY
GLUCOSE-CAPILLARY: 167 mg/dL — AB (ref 65–99)
GLUCOSE-CAPILLARY: 144 mg/dL — AB (ref 65–99)
GLUCOSE-CAPILLARY: 159 mg/dL — AB (ref 65–99)
Glucose-Capillary: 169 mg/dL — ABNORMAL HIGH (ref 65–99)
Glucose-Capillary: 176 mg/dL — ABNORMAL HIGH (ref 65–99)

## 2015-02-13 MED ORDER — CEFAZOLIN SODIUM-DEXTROSE 2-3 GM-% IV SOLR: 2.0000 g | INTRAVENOUS | Status: DC

## 2015-02-13 MED ORDER — CEFAZOLIN SODIUM-DEXTROSE 2-3 GM-% IV SOLR
2.0000 g | INTRAVENOUS | Status: AC
  Administered 2015-02-14: 2 g via INTRAVENOUS
  Filled 2015-02-13: qty 50

## 2015-02-13 MED ORDER — INSULIN GLARGINE 100 UNIT/ML ~~LOC~~ SOLN
8.0000 [IU] | Freq: Every day | SUBCUTANEOUS | Status: DC
  Administered 2015-02-13 – 2015-02-15 (×2): 8 [IU] via SUBCUTANEOUS
  Filled 2015-02-13 (×4): qty 0.08

## 2015-02-13 MED ORDER — PIVOT 1.5 CAL PO LIQD
1000.0000 mL | ORAL | Status: DC
  Administered 2015-02-14 – 2015-02-15 (×2): 1000 mL
  Filled 2015-02-13 (×7): qty 1000

## 2015-02-13 MED ORDER — DEXTROSE-NACL 5-0.45 % IV SOLN
100.0000 mL/h | INTRAVENOUS | Status: DC
  Administered 2015-02-14: 100 mL/h via INTRAVENOUS

## 2015-02-13 MED ORDER — ACETAMINOPHEN 500 MG PO TABS
1000.0000 mg | ORAL_TABLET | Freq: Once | ORAL | Status: AC
  Administered 2015-02-14: 1000 mg via ORAL
  Filled 2015-02-13: qty 2

## 2015-02-13 MED ORDER — PRO-STAT SUGAR FREE PO LIQD
30.0000 mL | Freq: Two times a day (BID) | ORAL | Status: DC
  Administered 2015-02-13 – 2015-02-16 (×6): 30 mL
  Filled 2015-02-13 (×8): qty 30

## 2015-02-13 NOTE — Progress Notes (Signed)
Nutrition Follow-up  INTERVENTION:  Increase Pivot 1.5 to 60 ml/hr  Add 30 ml Prostat BID  Provides: 2360 kcal, 165 grams protein, and 1092 ml H2O  NUTRITION DIAGNOSIS:  Inadequate oral intake related to inability to eat as evidenced by NPO status.  ongoing  GOAL:  Patient will meet greater than or equal to 90% of their needs  Not met  MONITOR:  Vent status, TF tolerance, Diet advancement, Weight trends, Skin, I & O's  REASON FOR ASSESSMENT:  Ventilator    ASSESSMENT:  49 y.o. Male with history of HTN and DM, presents as level 1 trauma after a 1.5 ft diameter tree fell on him while he was cutting it.Intubated for airway protection. Right displaced rib fractures 2-9 Moderate right pneumothorax/pleural effusion Right femur fracture Right ankle fracture Right ulna fracture Enlargement of right adrenal gland ?nodule vs hematoma Free fluid of right liver edge ? Small liver laceration  POD#1 R IM nail of the femur, Ex-fix of the R ankle and ORIF of the R Ulna.  Patient is currently intubated on ventilator support MV: 8.5 L/min Temp (24hrs), Avg:100.2 F (37.9 C), Min:99.5 F (37.5 C), Max:100.6 F (38.1 C)  OG tube confirmation: ascultation  Labs and medications reviewed.   Pivot 1.5 infusing @ 20 ml/hr via OG tube. Provides: 720 kcal, 45 grams protein,  364 ml H2O, and 480 ml total volume.  Discussed with RN.   Height:  Ht Readings from Last 1 Encounters:  02/08/15 _0  (1.88 m)    Weight:  Wt Readings from Last 1 Encounters:  02/13/15 256 lb 13.4 oz (116.5 kg)    Ideal Body Weight:  86.4 kg  Wt Readings from Last 10 Encounters:  02/13/15 256 lb 13.4 oz (116.5 kg)    BMI:  Body mass index is 32.96 kg/(m^2).  Estimated Nutritional Needs:  Kcal:  2191-2425  Protein:  155-175 grams  Fluid:  2.9 L/day  Skin:  Wound (see comment) (closed incisions on right arm and leg)  Diet Order:  Diet NPO time specified  EDUCATION NEEDS:  No  education needs identified at this time   Last BM:  7/1  [place], [place], [place] Pager [phone] After Hours Pager

## 2015-02-13 NOTE — Progress Notes (Signed)
**Note De-identified Jeremy Burgess Obfuscation** Patient tolerated transport to CT 

## 2015-02-13 NOTE — Evaluation (Signed)
Physical Therapy Evaluation Patient Details Name: Jeremy Burgess MRN: [id number] DOB: 18-Mar-1966 Today's Date: 02/13/2015   History of Present Illness  Pt admitted after large tree limb fell on him.  He sustained Rt displaced rib fractures 2-9, moderate Rt pneumothorax/pleural effusion - pt orally intubated, Rt CT placed; Rt femur  fracture s/p IMN ( knee and hip ROM as tolerated); Rt ankle/pilon  fracture s/p ex fix with plan for surgery 02/13/14 NWB Rt LE; Rt ulna fracture s/p ORIF WBAT through elbow, ? small liver laceration; hematoma quad/adductor muscles.  PMH includes: IDDM, HTN s   Clinical Impression  Pt admitted with/for trauma above from tree limb falling on him.  Pt currently limited functionally due to the problems listed. ( See problems list.)   Pt will benefit from PT to maximize function and safety in order to get ready for next venue listed below.     Follow Up Recommendations CIR    Equipment Recommendations  Other (comment) (TBA)    Recommendations for Other Services Rehab consult     Precautions / Restrictions Precautions Precautions: Other (comment) Precaution Comments: Rt ankle ex fix; Rt rib fractures, CTs, orally intubated  Restrictions Weight Bearing Restrictions: Yes RUE Weight Bearing: Weight bearing as tolerated (through elbow only) RLE Weight Bearing: Non weight bearing      Mobility  Bed Mobility Overal bed mobility: Needs Assistance Bed Mobility: Supine to Sit     Supine to sit: Total assist;+2 for physical assistance     General bed mobility comments: Assiste with moving LEs off bed, and lifted trunk halfway up, when pt indicated severe pain requesting to stop.  Pt assisted back to supine with total A +2  Transfers                 General transfer comment: deferred due to pain  Ambulation/Gait                Stairs            Wheelchair Mobility    Modified Rankin (Stroke Patients Only)       Balance                                             Pertinent Vitals/Pain Pain Assessment: 0-10 Pain Score: 0-No pain Pain Location: ant chest/ribs Pain Descriptors / Indicators: Grimacing;Guarding Pain Intervention(s): Limited activity within patient's tolerance;Monitored during session;Repositioned    Home Living Family/patient expects to be discharged to:: Private residence Living Arrangements: Other relatives (brother and sister in [place]) Available Help at Discharge: Family;Available 24 hours/day;Available PRN/intermittently Type of Home: House Home Access: Stairs to enter   Entrance Stairs-Number of Steps: 3 Home Layout: One level Home Equipment: None Additional Comments: Plan is for pt to go to brother's at discharge.  They plan to build a ramp if necessary     Prior Function Level of Independence: Independent         Comments: Pt works in [employer].  Fully independent, drives      Hand Dominance   Dominant Hand: Right    Extremity/Trunk Assessment   Upper Extremity Assessment: Defer to OT evaluation RUE Deficits / Details: Pt s/p ORIF ulna.  AROM WFL Rt UE          Lower Extremity Assessment: LLE deficits/detail;RLE deficits/detail RLE Deficits / Details: WFL    Cervical / Trunk Assessment:  Other exceptions  Communication   Communication: No difficulties  Cognition Arousal/Alertness: Awake/alert Behavior During Therapy: Anxious Overall Cognitive Status: Difficult to assess                      General Comments      Exercises        Assessment/Plan    PT Assessment Patient needs continued PT services  PT Diagnosis Acute pain;Difficulty walking;Generalized weakness   PT Problem List Decreased strength;Decreased activity tolerance;Decreased mobility;Decreased knowledge of use of DME;Cardiopulmonary status limiting activity;Pain  PT Treatment Interventions Gait training;Functional mobility training;Therapeutic activities;Therapeutic  exercise;Balance training;Patient/family education;DME instruction   PT Goals (Current goals can be found in the Care Plan section) Acute Rehab PT Goals Patient Stated Goal: pt unable/vented PT Goal Formulation: With patient/family Time For Goal Achievement: 02/27/15 Potential to Achieve Goals: Good    Frequency Min 3X/week   Barriers to discharge        Co-evaluation PT/OT/SLP Co-Evaluation/Treatment: Yes Reason for Co-Treatment: Complexity of the patient's impairments (multi-system involvement);For patient/therapist safety PT goals addressed during session: Mobility/safety with mobility         End of Session Equipment Utilized During Treatment: Oxygen Activity Tolerance: Patient limited by pain Patient left: in bed;with call bell/phone within reach           Time: 1603-1629 PT Time Calculation (min) (ACUTE ONLY): 26 min   Charges:   PT Evaluation $Initial PT Evaluation Tier I: 1 Procedure     PT G Codes:        Jadea Shiffer, Tessie Fass 02/13/2015, 5:00 PM 02/13/2015  Donnella Sham, [place] [phone]  (pager)

## 2015-02-13 NOTE — Evaluation (Addendum)
Occupational Therapy Evaluation Patient Details Name: Jeremy Burgess MRN: [id number] DOB: 1966-08-12 Today's Date: 02/13/2015    History of Present Illness Pt admitted after large tree limb fell on him.  He sustained Rt displaced rib fractures 2-9, moderate Rt pneumothorax/pleural effusion - pt orally intubated, Rt CT placed; Rt femur  fracture s/p IMN ( knee and hip ROM as tolerated); Rt ankle/pilon  fracture s/p ex fix with plan for surgery 02/13/14 NWB Rt LE; Rt ulna fracture s/p ORIF WBAT through elbow, ? small liver laceration; hematoma quad/adductor muscles.  PMH includes: IDDM, HTN s    Clinical Impression   Pt admitted with above. He demonstrates the below listed deficits and will benefit from continued OT to maximize safety and independence with BADLs. Pt presents to OT with generalized weakness, pain, and WBing limitations.  Able to move pt to partial EOB sitting, but pain (rib cage) severe and pt returned to supine.  Anticipate good progress once pain controlled.  Recommend CIR.       Follow Up Recommendations  CIR;Supervision/Assistance - 24 hour    Equipment Recommendations  Other (comment) (TBD or defer to next venue )    Recommendations for Other Services       Precautions / Restrictions Precautions Precautions: Other (comment) Precaution Comments: Rt ankle ex fix; Rt rib fractures, CTs, orally intubated  Restrictions Weight Bearing Restrictions: Yes RUE Weight Bearing: Weight bearing as tolerated (through elbow only) RLE Weight Bearing: Non weight bearing      Mobility Bed Mobility Overal bed mobility: Needs Assistance Bed Mobility: Supine to Sit     Supine to sit: Total assist;+2 for physical assistance     General bed mobility comments: Assiste with moving LEs off bed, and lifted trunk halfway up, when pt indicated severe pain requesting to stop.  Pt assisted back to supine with total A +2  Transfers                      Balance                                             ADL Overall ADL's : Needs assistance/impaired Eating/Feeding: NPO   Grooming: Wash/dry hands;Wash/dry face;Set up;Bed level   Upper Body Bathing: Moderate assistance;Bed level   Lower Body Bathing: Total assistance;Bed level;+2 for physical assistance   Upper Body Dressing : Total assistance;Bed level   Lower Body Dressing: Total assistance;Bed level;+2 for physical assistance   Toilet Transfer: Total assistance Toilet Transfer Details (indicate cue type and reason): unable  Toileting- Clothing Manipulation and Hygiene: Total assistance;Bed level;+2 for physical assistance       Functional mobility during ADLs: +2 for physical assistance;Total assistance General ADL Comments: Pt limited by pain      Vision     Perception     Praxis      Pertinent Vitals/Pain Pain Assessment: 0-10 Pain Score: 10-Worst pain ever Pain Location: Rt chest ribs Pain Descriptors / Indicators: Grimacing;Guarding Pain Intervention(s): Limited activity within patient's tolerance;Monitored during session;Repositioned     Hand Dominance Right   Extremity/Trunk Assessment Upper Extremity Assessment Upper Extremity Assessment: RUE deficits/detail RUE Deficits / Details: Pt s/p ORIF ulna.  AROM WFL Rt UE    Lower Extremity Assessment Lower Extremity Assessment: Defer to PT evaluation   Cervical / Trunk Assessment Cervical / Trunk Assessment: Other exceptions Cervical /  Trunk Exceptions: multiple Rt rib fractures    Communication Communication Communication: No difficulties   Cognition Arousal/Alertness: Awake/alert Behavior During Therapy: Anxious Overall Cognitive Status: Difficult to assess                     General Comments       Exercises       Shoulder Instructions      Home Living Family/patient expects to be discharged to:: Private residence Living Arrangements: Other relatives (brother and sister in  law) Available Help at Discharge: Family;Available 24 hours/day;Available PRN/intermittently Type of Home: House Home Access: Stairs to enter Entrance Stairs-Number of Steps: 3   Home Layout: One level     Bathroom Shower/Tub: Tub/shower unit;Walk-in shower;Tub only Shower/tub characteristics: Architectural technologist: Standard     Home Equipment: None   Additional Comments: Plan is for pt to go to brother's at discharge.  They plan to build a ramp if necessary       Prior Functioning/Environment Level of Independence: Independent        Comments: Pt works in [employer].  Fully independent, drives     OT Diagnosis: Generalized weakness;Acute pain   OT Problem List: Decreased strength;Decreased activity tolerance;Impaired balance (sitting and/or standing);Decreased safety awareness;Decreased knowledge of use of DME or AE;Decreased knowledge of precautions;Cardiopulmonary status limiting activity;Impaired UE functional use;Pain   OT Treatment/Interventions: Self-care/ADL training;Therapeutic exercise;DME and/or AE instruction;Therapeutic activities;Patient/family education;Balance training    OT Goals(Current goals can be found in the care plan section) Acute Rehab OT Goals OT Goal Formulation: With patient/family Time For Goal Achievement: 02/27/15 Potential to Achieve Goals: Good ADL Goals Pt Will Perform Grooming: with min assist;sitting Pt Will Perform Upper Body Bathing: with min assist;sitting Pt Will Perform Lower Body Bathing: with mod assist;sit to/from stand;with adaptive equipment Pt Will Perform Upper Body Dressing: with min assist;sitting Pt Will Perform Lower Body Dressing: with mod assist;with adaptive equipment;sit to/from stand Pt Will Transfer to Toilet: with mod assist;stand pivot transfer;bedside commode Pt Will Perform Toileting - Clothing Manipulation and hygiene: with mod assist;sit to/from stand  OT Frequency: Min 3X/week   Barriers to D/C:             Co-evaluation              End of Session Equipment Utilized During Treatment: Other (comment) (intubated) Nurse Communication: Mobility status;Other (comment) (pain level )  Activity Tolerance: Patient limited by pain Patient left: in bed;with call bell/phone within reach;with nursing/sitter in room;with family/visitor present   Time: 9983-3825 OT Time Calculation (min): 26 min Charges:  OT General Charges $OT Visit: 1 Procedure OT Evaluation $Initial OT Evaluation Tier I: 1 Procedure G-Codes:    Lucille Passy M 03-08-15, 4:49 PM

## 2015-02-13 NOTE — Progress Notes (Signed)
Patient ID: Jeremy Burgess, male   DOB: May 21, 1966, 49 y.o.   MRN: [id number] Follow up - Trauma and Critical Care  Patient Details:    Jeremy Burgess is an 49 y.o. male.  Lines/tubes : Airway 7.5 mm (Active)  Secured at (cm) 23 cm 02/10/2015  3:32 AM  Measured From Lips 02/10/2015  3:32 AM  Secured Location Center 02/10/2015  3:32 AM  Secured By [employer] 02/10/2015  3:32 AM  Tube Holder Repositioned Yes 02/10/2015  3:32 AM  Cuff Pressure (cm H2O) 24 cm H2O 02/10/2015 12:03 AM  Site Condition Dry 02/10/2015  3:32 AM     CVC Triple Lumen 02/08/15 Left Femoral (Active)  Indication for Insertion or Continuance of Line Head or chest injuries (Tracheotomy, burns, open chest wounds);Vasoactive infusions;Limited venous access - need for IV therapy >5 days (PICC only) 02/10/2015  7:30 AM  Site Assessment Clean;Intact;Dry 02/10/2015  7:30 AM  Proximal Lumen Status Infusing 02/10/2015  7:30 AM  Medial Infusing 02/10/2015  7:30 AM  Distal Lumen Status Infusing 02/10/2015  7:30 AM  Dressing Type Transparent 02/10/2015  7:30 AM  Dressing Status Clean;Dry;Intact 02/10/2015  7:30 AM  Line Care Connections checked and tightened 02/10/2015  7:30 AM  Dressing Change Due 02/15/15 02/10/2015  7:30 AM     Chest Tube 1 Right Pleural 28 Fr. (Active)  Suction To water seal 02/10/2015  8:00 AM  Chest Tube Air Leak None 02/10/2015  8:00 AM  Patency Intervention Tip/tilt 02/08/2015  8:00 PM  Drainage Description Bright red 02/10/2015  8:00 AM  Dressing Status Dry;Intact 02/10/2015  8:00 AM  Dressing Intervention Dressing reinforced 02/08/2015  1:40 PM  Site Assessment Clean;Dry;Intact 02/10/2015  8:00 AM  Surrounding Skin Dry 02/10/2015  8:00 AM  Output (mL) 40 mL 02/10/2015  5:00 AM     Chest Tube 2 Pleural 32 Fr. (Active)  Suction To water seal 02/10/2015  8:00 AM  Chest Tube Air Leak None 02/10/2015  8:00 AM  Patency Intervention Tip/tilt 02/08/2015  8:00 PM  Drainage Description Bright red 02/10/2015  8:00 AM  Dressing Status Dry;Intact  02/10/2015  8:00 AM  Dressing Intervention Dressing reinforced 02/08/2015  1:40 PM  Site Assessment Clean;Dry;Intact 02/10/2015  8:00 AM  Surrounding Skin Dry 02/10/2015  8:00 AM  Output (mL) 120 mL 02/10/2015  5:00 AM     NG/OG Tube Orogastric Center mouth (Active)  Placement Verification Auscultation 02/09/2015  8:00 AM  Site Assessment Clean;Dry;Intact 02/10/2015  8:00 AM  Status Suction-low intermittent 02/10/2015  8:00 AM  Drainage Appearance Bile 02/10/2015  8:00 AM  Output (mL) 100 mL 02/10/2015  6:00 AM     Urethral Catheter [name] Straight-tip;Temperature probe 16 Fr. (Active)  Indication for Insertion or Continuance of Catheter Unstable critical patients (first 24-48 hours) 02/09/2015  8:00 AM  Site Assessment Intact;Clean 02/10/2015  8:00 AM  Catheter Maintenance Bag below level of bladder;Drainage bag/tubing not touching floor;Insertion date on drainage bag;No dependent loops;Seal intact;Bag emptied prior to transport;Catheter secured 02/10/2015  8:00 AM  Collection Container Standard drainage bag 02/10/2015  8:00 AM  Securement Method Securing device (Describe) 02/10/2015  8:00 AM  Output (mL) 300 mL 02/10/2015  8:00 AM    Microbiology/Sepsis markers: Results for orders placed or performed during the hospital encounter of 02/08/15  MRSA PCR Screening     Status: None   Collection Time: 02/08/15  3:37 PM  Result Value Ref Range Status   MRSA by PCR NEGATIVE NEGATIVE Final    Comment:  The GeneXpert MRSA Assay (FDA approved for NASAL specimens only), is one component of a comprehensive MRSA colonization surveillance program. It is not intended to diagnose MRSA infection nor to guide or monitor treatment for MRSA infections.   Culture, respiratory (NON-Expectorated)     Status: None   Collection Time: 02/09/15  7:40 AM  Result Value Ref Range Status   Specimen Description TRACHEAL ASPIRATE  Final   Special Requests NONE  Final   Gram Stain   Final    FEW WBC PRESENT,  PREDOMINANTLY MONONUCLEAR RARE SQUAMOUS EPITHELIAL CELLS PRESENT FEW GRAM POSITIVE COCCI IN PAIRS IN CLUSTERS Performed at [facility]    Culture   Final    MODERATE STAPHYLOCOCCUS AUREUS Note: RIFAMPIN AND GENTAMICIN SHOULD NOT BE USED AS SINGLE DRUGS FOR TREATMENT OF STAPH INFECTIONS. Performed at [facility]    Report Status 02/12/2015 FINAL  Final   Organism ID, Bacteria STAPHYLOCOCCUS AUREUS  Final      Susceptibility   Staphylococcus aureus - MIC*    CLINDAMYCIN <=0.25 SENSITIVE Sensitive     ERYTHROMYCIN <=0.25 SENSITIVE Sensitive     GENTAMICIN <=0.5 SENSITIVE Sensitive     LEVOFLOXACIN 0.25 SENSITIVE Sensitive     OXACILLIN 0.5 SENSITIVE Sensitive     PENICILLIN >=0.5 RESISTANT Resistant     RIFAMPIN <=0.5 SENSITIVE Sensitive     TRIMETH/SULFA <=10 SENSITIVE Sensitive     VANCOMYCIN <=0.5 SENSITIVE Sensitive     TETRACYCLINE <=1 SENSITIVE Sensitive     MOXIFLOXACIN <=0.25 SENSITIVE Sensitive     * MODERATE STAPHYLOCOCCUS AUREUS  Culture, Urine     Status: None   Collection Time: 02/11/15 10:00 AM  Result Value Ref Range Status   Specimen Description URINE, CATHETERIZED  Final   Special Requests NONE  Final   Culture NO GROWTH 1 DAY  Final   Report Status 02/12/2015 FINAL  Final  Culture, blood (routine x 2)     Status: None (Preliminary result)   Collection Time: 02/12/15 10:30 AM  Result Value Ref Range Status   Specimen Description BLOOD RIGHT ANTECUBITAL  Final   Special Requests BOTTLES DRAWN AEROBIC AND ANAEROBIC 10CC  Final   Culture PENDING  Incomplete   Report Status PENDING  Incomplete    Anti-infectives:  Anti-infectives    Start     Dose/Rate Route Frequency Ordered Stop   02/12/15 1400  piperacillin-tazobactam (ZOSYN) IVPB 3.375 g     3.375 g 12.5 mL/hr over 240 Minutes Intravenous Every 8 hours 02/12/15 0854     02/12/15 1045  vancomycin (VANCOCIN) 1,250 mg in sodium chloride 0.9 % 250 mL IVPB     1,250 mg 166.7 mL/hr over 90  Minutes Intravenous Every 12 hours 02/12/15 1040     02/09/15 2030  ceFAZolin (ANCEF) IVPB 2 g/50 mL premix     2 g 100 mL/hr over 30 Minutes Intravenous Every 6 hours 02/09/15 1841 02/10/15 0910   02/09/15 1230  ceFAZolin (ANCEF) IVPB 2 g/50 mL premix     2 g 100 mL/hr over 30 Minutes Intravenous To [facility] 02/08/15 1418 02/09/15 1425      Best Practice/Protocols:  VTE Prophylaxis: Mechanical GI Prophylaxis: Proton Pump Inhibitor Continous Sedation  Consults: Treatment Team:  Trauma Md, MD [name], MD Ivin Poot, MD    Events:  Subjective:    Overnight Issues: Fevers improved since antibiotics started yesterday.  Vent rate dropped yesterday in attempt to wean, but he just took a very few numbers  of large breaths with the ventilator.    Objective:  Vital signs for last 24 hours: Temp:  [99.7 F (37.6 C)-100.6 F (38.1 C)] 99.7 F (37.6 C) (07/04 0700) Pulse Rate:  [60-80] 60 (07/04 0700) Resp:  [7-29] 20 (07/04 0700) BP: (115-170)/(31-75) 119/59 mmHg (07/04 0700) SpO2:  [95 %-100 %] 99 % (07/04 0753) Arterial Line BP: (123-182)/(48-78) 145/55 mmHg (07/04 0700) FiO2 (%):  [40 %] 40 % (07/04 0753) Weight:  [116.5 kg (256 lb 13.4 oz)] 116.5 kg (256 lb 13.4 oz) (07/04 0500)  Hemodynamic parameters for last 24 hours:    Intake/Output from previous day: 07/03 0701 - 07/04 0700 In: 4035 [I.V.:2780; NG/GT:630; IV Piggyback:625] Out: 2365 [RWERX:5400; Chest Tube:270]  Intake/Output this shift:    Vent settings for last 24 hours: Vent Mode:  [-] PSV;CPAP FiO2 (%):  [40 %] 40 % Set Rate:  [20 bmp] 20 bmp Vt Set:  [500 mL-520 mL] 520 mL PEEP:  [8 cmH20] 8 cmH20 Pressure Support:  [12 cmH20] 12 cmH20 Plateau Pressure:  [23 QQP61-95 cmH20] 23 cmH20  Physical Exam:  General: alert, able to follow commands and interact.   Neuro: alert, oriented and moving all extremities to command.   Sensory intact BLE.  Good motor function in both legs.     Resp: coarse sounds bilaterally.  CVS: regular rate and rhythm GI: Soft, nontender.  Non distended.   Extremities: right below knee extremity in external fixator.    Results for orders placed or performed during the hospital encounter of 02/08/15 (from the past 24 hour(s))  Culture, blood (routine x 2)     Status: None (Preliminary result)   Collection Time: 02/12/15 10:30 AM  Result Value Ref Range   Specimen Description BLOOD RIGHT ANTECUBITAL    Special Requests BOTTLES DRAWN AEROBIC AND ANAEROBIC 10CC    Culture PENDING    Report Status PENDING   Urinalysis, Routine w reflex microscopic (not at [facility])     Status: Abnormal   Collection Time: 02/12/15 12:06 PM  Result Value Ref Range   Color, Urine AMBER (A) YELLOW   APPearance CLOUDY (A) CLEAR   Specific Gravity, Urine 1.021 1.005 - 1.030   pH 5.5 5.0 - 8.0   Glucose, UA 250 (A) NEGATIVE mg/dL   Hgb urine dipstick LARGE (A) NEGATIVE   Bilirubin Urine NEGATIVE NEGATIVE   Ketones, ur NEGATIVE NEGATIVE mg/dL   Protein, ur 100 (A) NEGATIVE mg/dL   Urobilinogen, UA 1.0 0.0 - 1.0 mg/dL   Nitrite NEGATIVE NEGATIVE   Leukocytes, UA NEGATIVE NEGATIVE  Urine microscopic-add on     Status: None   Collection Time: 02/12/15 12:06 PM  Result Value Ref Range   RBC / HPF 3-6 <3 RBC/hpf   Urine-Other AMORPHOUS URATES/PHOSPHATES   Glucose, capillary     Status: Abnormal   Collection Time: 02/12/15 12:29 PM  Result Value Ref Range   Glucose-Capillary 150 (H) 65 - 99 mg/dL  Glucose, capillary     Status: Abnormal   Collection Time: 02/12/15  4:08 PM  Result Value Ref Range   Glucose-Capillary 162 (H) 65 - 99 mg/dL  Glucose, capillary     Status: Abnormal   Collection Time: 02/12/15  7:41 PM  Result Value Ref Range   Glucose-Capillary 149 (H) 65 - 99 mg/dL  Glucose, capillary     Status: Abnormal   Collection Time: 02/12/15 11:38 PM  Result Value Ref Range   Glucose-Capillary 171 (H) 65 - 99 mg/dL  Glucose, capillary  Status:  Abnormal   Collection Time: 02/13/15  3:28 AM  Result Value Ref Range   Glucose-Capillary 167 (H) 65 - 99 mg/dL     Assessment/Plan:   NEURO  Altered Mental Status:  sedation and but still responsive   Plan: Wean sedation as tolerated.  PULM  Atelectasis/collapse (focal and right lung) Chest Wall Trauma multiple rib fractures and Lung Trauma (right and with contusion of lung).  PTX same to slightly increased, still small    Plan: Continue ventilator support.  Continue bronchodilator for wheezing. Chest tubes continued on suction.  Suspected PNA.  Respiratory culture from 6/30 with sensitive staph aureus.    CARDIO  No specific issues   Plan: CPM  RENAL    Acute kidney injury.     Plan: Continued spontaneous diuresis.  Kidney injury appears resolved.    GI/FEN  No specific issues. Need for enteral feeding.     Plan: Continue tube feeds at goal.    ID  Cultures from yesterday pending.     Plan:continue antibiotics.  Await new set of cultures.  Vanc/Zosyn d2.    HEME  Anemia acute blood loss anemia and has improved and stabilized.)   Plan: Stable.    ENDO Hyperglycemia   Plan: Still with hyperglycemia.  Increase lantus.      Global Issues   Neurologically the patient is intact.  Orthopedically he will need more surgery on his right ankle to remove external fixator. Ankle CT today.  Vent weaning today.     LOS: 4 days   Additional comments:I reviewed the patient's new clinical lab test results. cbc/bmet and I reviewed the patients new imaging test results. cxr  Critical Care Total Time*: 30 Minutes  Toron Bowring 02/13/2015

## 2015-02-14 ENCOUNTER — Inpatient Hospital Stay (HOSPITAL_COMMUNITY): Payer: Self-pay | Admitting: Anesthesiology

## 2015-02-14 ENCOUNTER — Inpatient Hospital Stay (HOSPITAL_COMMUNITY): Payer: MEDICAID | Admitting: Anesthesiology

## 2015-02-14 ENCOUNTER — Encounter (HOSPITAL_COMMUNITY): Admission: EM | Disposition: A | Discharge status: Rehabilitation Facility | Payer: Self-pay | Source: Home / Self Care

## 2015-02-14 ENCOUNTER — Inpatient Hospital Stay (HOSPITAL_COMMUNITY): Payer: Self-pay

## 2015-02-14 ENCOUNTER — Encounter (HOSPITAL_COMMUNITY): Payer: Self-pay | Admitting: Certified Registered Nurse Anesthetist

## 2015-02-14 HISTORY — PX: ORIF ANKLE FRACTURE: SHX5408

## 2015-02-14 HISTORY — PX: EXTERNAL FIXATION REMOVAL: SHX5040

## 2015-02-14 LAB — CBC
HCT: 28.5 % — ABNORMAL LOW (ref 39.0–52.0)
HEMOGLOBIN: 9.4 g/dL — AB (ref 13.0–17.0)
MCH: 28.7 pg (ref 26.0–34.0)
MCHC: 33 g/dL (ref 30.0–36.0)
MCV: 87.2 fL (ref 78.0–100.0)
Platelets: 150 10*3/uL (ref 150–400)
RBC: 3.27 MIL/uL — ABNORMAL LOW (ref 4.22–5.81)
RDW: 14.8 % (ref 11.5–15.5)
WBC: 11.7 10*3/uL — AB (ref 4.0–10.5)

## 2015-02-14 LAB — BASIC METABOLIC PANEL
ANION GAP: 6 (ref 5–15)
BUN: 31 mg/dL — ABNORMAL HIGH (ref 6–20)
CHLORIDE: 111 mmol/L (ref 101–111)
CO2: 27 mmol/L (ref 22–32)
Calcium: 8 mg/dL — ABNORMAL LOW (ref 8.9–10.3)
Creatinine, Ser: 0.99 mg/dL (ref 0.61–1.24)
GFR calc non Af Amer: >60 mL/min (ref >60)
Glucose, Bld: 187 mg/dL — ABNORMAL HIGH (ref 65–99)
Potassium: 3.8 mmol/L (ref 3.5–5.1)
Sodium: 144 mmol/L (ref 135–145)

## 2015-02-14 LAB — POCT I-STAT 3, ART BLOOD GAS (G3+)
Acid-Base Excess: 6 mmol/L — ABNORMAL HIGH (ref 0.0–2.0)
Acid-Base Excess: 5 mmol/L — ABNORMAL HIGH (ref 0.0–2.0)
BICARBONATE: 29.6 meq/L — AB (ref 20.0–24.0)
Bicarbonate: 31 meq/L — ABNORMAL HIGH (ref 20.0–24.0)
O2 SAT: 96 %
O2 Saturation: 99 %
PCO2 ART: 47.3 mmHg — AB (ref 35.0–45.0)
PH ART: 7.418 (ref 7.350–7.450)
PH ART: 7.409 (ref 7.350–7.450)
Patient temperature: 100.7
TCO2: 32 mmol/L (ref 0–100)
TCO2: 31 mmol/L (ref 0–100)
pCO2 arterial: 47.9 mmHg — ABNORMAL HIGH (ref 35.0–45.0)
pO2, Arterial: 83 mmHg (ref 80.0–100.0)
pO2, Arterial: 135 mmHg — ABNORMAL HIGH (ref 80.0–100.0)

## 2015-02-14 LAB — GLUCOSE, CAPILLARY
GLUCOSE-CAPILLARY: 164 mg/dL — AB (ref 65–99)
Glucose-Capillary: 147 mg/dL — ABNORMAL HIGH (ref 65–99)
Glucose-Capillary: 147 mg/dL — ABNORMAL HIGH (ref 65–99)
Glucose-Capillary: 149 mg/dL — ABNORMAL HIGH (ref 65–99)
Glucose-Capillary: 165 mg/dL — ABNORMAL HIGH (ref 65–99)
Glucose-Capillary: 176 mg/dL — ABNORMAL HIGH (ref 65–99)

## 2015-02-14 LAB — CULTURE, RESPIRATORY W GRAM STAIN

## 2015-02-14 LAB — CULTURE, RESPIRATORY: SPECIAL REQUESTS: NORMAL

## 2015-02-14 SURGERY — OPEN REDUCTION INTERNAL FIXATION (ORIF) ANKLE FRACTURE
Anesthesia: General | Site: Ankle | Laterality: Right

## 2015-02-14 MED ORDER — ALBUTEROL SULFATE HFA 108 (90 BASE) MCG/ACT IN AERS
INHALATION_SPRAY | RESPIRATORY_TRACT | Status: AC
  Filled 2015-02-14: qty 6.7

## 2015-02-14 MED ORDER — MIDAZOLAM HCL 2 MG/2ML IJ SOLN
INTRAMUSCULAR | Status: AC
  Filled 2015-02-14: qty 2

## 2015-02-14 MED ORDER — ALBUTEROL SULFATE HFA 108 (90 BASE) MCG/ACT IN AERS
INHALATION_SPRAY | RESPIRATORY_TRACT | Status: DC | PRN
  Administered 2015-02-14: 6 via RESPIRATORY_TRACT

## 2015-02-14 MED ORDER — EPHEDRINE SULFATE 50 MG/ML IJ SOLN
INTRAMUSCULAR | Status: AC
  Filled 2015-02-14: qty 1

## 2015-02-14 MED ORDER — OXYCODONE-ACETAMINOPHEN 5-325 MG PO TABS: 1.0000 | ORAL_TABLET | ORAL | Status: DC | PRN

## 2015-02-14 MED ORDER — ROCURONIUM BROMIDE 50 MG/5ML IV SOLN
INTRAVENOUS | Status: AC
  Filled 2015-02-14: qty 1

## 2015-02-14 MED ORDER — FENTANYL CITRATE (PF) 100 MCG/2ML IJ SOLN
INTRAMUSCULAR | Status: DC | PRN
  Administered 2015-02-14: 50 ug via INTRAVENOUS
  Administered 2015-02-14 (×2): 100 ug via INTRAVENOUS
  Administered 2015-02-14: 150 ug via INTRAVENOUS
  Administered 2015-02-14: 100 ug via INTRAVENOUS

## 2015-02-14 MED ORDER — STERILE WATER FOR INJECTION IJ SOLN
INTRAMUSCULAR | Status: AC
  Filled 2015-02-14: qty 10

## 2015-02-14 MED ORDER — EPHEDRINE SULFATE 50 MG/ML IJ SOLN
INTRAMUSCULAR | Status: DC | PRN
  Administered 2015-02-14: 10 mg via INTRAVENOUS

## 2015-02-14 MED ORDER — LACTATED RINGERS IV SOLN
INTRAVENOUS | Status: DC | PRN
  Administered 2015-02-14: 08:00:00 via INTRAVENOUS

## 2015-02-14 MED ORDER — ROCURONIUM BROMIDE 100 MG/10ML IV SOLN
INTRAVENOUS | Status: DC | PRN
Start: 2015-02-14 — End: 2015-02-14
  Administered 2015-02-14: 40 mg via INTRAVENOUS
  Administered 2015-02-14 (×2): 50 mg via INTRAVENOUS

## 2015-02-14 MED ORDER — SUCCINYLCHOLINE CHLORIDE 20 MG/ML IJ SOLN
INTRAMUSCULAR | Status: AC
  Filled 2015-02-14: qty 1

## 2015-02-14 MED ORDER — PROPOFOL 10 MG/ML IV BOLUS
INTRAVENOUS | Status: DC | PRN
  Administered 2015-02-14: 100 mg via INTRAVENOUS

## 2015-02-14 MED ORDER — PHENYLEPHRINE HCL 10 MG/ML IJ SOLN
INTRAMUSCULAR | Status: DC | PRN
  Administered 2015-02-14 (×2): 80 ug via INTRAVENOUS

## 2015-02-14 MED ORDER — IPRATROPIUM-ALBUTEROL 0.5-2.5 (3) MG/3ML IN SOLN
3.0000 mL | Freq: Four times a day (QID) | RESPIRATORY_TRACT | Status: DC
  Administered 2015-02-14 – 2015-02-16 (×9): 3 mL via RESPIRATORY_TRACT
  Filled 2015-02-14 (×9): qty 3

## 2015-02-14 MED ORDER — HYDROMORPHONE HCL 1 MG/ML IJ SOLN
1.0000 mg | INTRAMUSCULAR | Status: DC | PRN
  Administered 2015-02-17 (×2): 1 mg via INTRAVENOUS
  Filled 2015-02-14 (×2): qty 1

## 2015-02-14 MED ORDER — BUPIVACAINE HCL (PF) 0.25 % IJ SOLN
INTRAMUSCULAR | Status: AC
  Filled 2015-02-14: qty 30

## 2015-02-14 MED ORDER — FENTANYL CITRATE (PF) 250 MCG/5ML IJ SOLN
INTRAMUSCULAR | Status: AC
  Filled 2015-02-14: qty 5

## 2015-02-14 MED ORDER — DOCUSATE SODIUM 100 MG PO CAPS: 100.0000 mg | ORAL_CAPSULE | Freq: Two times a day (BID) | ORAL | Status: DC

## 2015-02-14 MED ORDER — 0.9 % SODIUM CHLORIDE (POUR BTL) OPTIME
TOPICAL | Status: DC | PRN
  Administered 2015-02-14: 1000 mL

## 2015-02-14 MED ORDER — LIDOCAINE HCL (CARDIAC) 20 MG/ML IV SOLN
INTRAVENOUS | Status: AC
  Filled 2015-02-14: qty 5

## 2015-02-14 MED ORDER — DOCUSATE SODIUM 100 MG PO CAPS
100.0000 mg | ORAL_CAPSULE | Freq: Two times a day (BID) | ORAL | Status: DC
  Filled 2015-02-14 (×2): qty 1

## 2015-02-14 MED ORDER — MIDAZOLAM HCL 5 MG/5ML IJ SOLN
INTRAMUSCULAR | Status: DC | PRN
  Administered 2015-02-14 (×2): 2 mg via INTRAVENOUS

## 2015-02-14 MED ORDER — IOHEXOL 350 MG/ML SOLN
100.0000 mL | Freq: Once | INTRAVENOUS | Status: AC | PRN
  Administered 2015-02-14: 100 mL via INTRAVENOUS

## 2015-02-14 MED ORDER — PROPOFOL 10 MG/ML IV BOLUS
INTRAVENOUS | Status: AC
  Filled 2015-02-14: qty 20

## 2015-02-14 MED ORDER — CEFAZOLIN SODIUM-DEXTROSE 2-3 GM-% IV SOLR
2.0000 g | Freq: Three times a day (TID) | INTRAVENOUS | Status: AC
  Administered 2015-02-14 – 2015-02-20 (×20): 2 g via INTRAVENOUS
  Filled 2015-02-14 (×21): qty 50

## 2015-02-14 MED ORDER — DOCUSATE SODIUM 50 MG/5ML PO LIQD: 100.0000 mg | Freq: Two times a day (BID) | ORAL | Status: DC

## 2015-02-14 MED ORDER — ONDANSETRON HCL 4 MG PO TABS: 4.0000 mg | ORAL_TABLET | Freq: Three times a day (TID) | ORAL | Status: DC | PRN

## 2015-02-14 MED ORDER — OXYCODONE HCL 5 MG PO TABS
5.0000 mg | ORAL_TABLET | ORAL | Status: DC | PRN
  Administered 2015-02-17 – 2015-02-23 (×32): 10 mg via ORAL
  Filled 2015-02-14 (×32): qty 2

## 2015-02-14 SURGICAL SUPPLY — 93 items
BANDAGE ELASTIC 4 VELCRO ST LF (GAUZE/BANDAGES/DRESSINGS) ×4 IMPLANT
BANDAGE ELASTIC 6 VELCRO ST LF (GAUZE/BANDAGES/DRESSINGS) ×5 IMPLANT
BANDAGE ESMARK 6X9 LF (GAUZE/BANDAGES/DRESSINGS) IMPLANT
BIT DRILL 2.5X125 (BIT) ×2 IMPLANT
BIT DRILL 3.5X125 (BIT) IMPLANT
BIT DRILL CANN 2.7 (BIT) ×2
BIT DRILL CANN 2.7MM (BIT) ×1
BIT DRILL SRG 2.7XCANN AO CPLG (BIT) IMPLANT
BIT DRL SRG 2.7XCANN AO CPLNG (BIT) ×1
BNDG CMPR 9X6 STRL LF SNTH (GAUZE/BANDAGES/DRESSINGS) ×1
BNDG COHESIVE 4X5 TAN STRL (GAUZE/BANDAGES/DRESSINGS) ×2 IMPLANT
BNDG ESMARK 6X9 LF (GAUZE/BANDAGES/DRESSINGS) ×3
CANISTER SUCTION 2500CC (MISCELLANEOUS) ×1 IMPLANT
CLOSURE WOUND 1/2 X4 (GAUZE/BANDAGES/DRESSINGS)
COVER SURGICAL LIGHT HANDLE (MISCELLANEOUS) ×3 IMPLANT
CUFF TOURNIQUET SINGLE 34IN LL (TOURNIQUET CUFF) ×2 IMPLANT
DRAPE C-ARM 42X72 X-RAY (DRAPES) IMPLANT
DRAPE C-ARMOR (DRAPES) ×1 IMPLANT
DRAPE OEC MINIVIEW 54X84 (DRAPES) ×2 IMPLANT
DRAPE ORTHO SPLIT 77X108 STRL (DRAPES) ×3
DRAPE SURG ORHT 6 SPLT 77X108 (DRAPES) IMPLANT
DRAPE U-SHAPE 47X51 STRL (DRAPES) ×3 IMPLANT
DRILL BIT 3.5X125 (BIT) ×3
DRSG ADAPTIC 3X8 NADH LF (GAUZE/BANDAGES/DRESSINGS) ×3 IMPLANT
DRSG MEPILEX BORDER 4X4 (GAUZE/BANDAGES/DRESSINGS) ×12 IMPLANT
DRSG MEPITEL 4X7.2 (GAUZE/BANDAGES/DRESSINGS) IMPLANT
DRSG PAD ABDOMINAL 8X10 ST (GAUZE/BANDAGES/DRESSINGS) ×6 IMPLANT
DURAPREP 26ML APPLICATOR (WOUND CARE) ×3 IMPLANT
ELECT REM PT RETURN 9FT ADLT (ELECTROSURGICAL) ×3
ELECTRODE REM PT RTRN 9FT ADLT (ELECTROSURGICAL) ×1 IMPLANT
GAUZE SPONGE 4X4 12PLY STRL (GAUZE/BANDAGES/DRESSINGS) ×1 IMPLANT
GLOVE BIO SURGEON STRL SZ7 (GLOVE) ×3 IMPLANT
GLOVE BIO SURGEON STRL SZ7.5 (GLOVE) ×6 IMPLANT
GLOVE BIO SURGEON STRL SZ8 (GLOVE) ×1 IMPLANT
GLOVE BIOGEL PI IND STRL 6.5 (GLOVE) IMPLANT
GLOVE BIOGEL PI IND STRL 7.0 (GLOVE) ×1 IMPLANT
GLOVE BIOGEL PI IND STRL 7.5 (GLOVE) IMPLANT
GLOVE BIOGEL PI IND STRL 8 (GLOVE) ×1 IMPLANT
GLOVE BIOGEL PI INDICATOR 6.5 (GLOVE) ×2
GLOVE BIOGEL PI INDICATOR 7.0 (GLOVE) ×2
GLOVE BIOGEL PI INDICATOR 7.5 (GLOVE) ×2
GLOVE BIOGEL PI INDICATOR 8 (GLOVE) ×4
GLOVE BIOGEL PI ORTHO PRO SZ8 (GLOVE) ×2
GLOVE PI ORTHO PRO STRL SZ8 (GLOVE) IMPLANT
GLOVE SURG SS PI 6.5 STRL IVOR (GLOVE) ×2 IMPLANT
GLOVE SURG SS PI 7.0 STRL IVOR (GLOVE) ×4 IMPLANT
GOWN STRL REUS W/ TWL LRG LVL3 (GOWN DISPOSABLE) ×1 IMPLANT
GOWN STRL REUS W/ TWL XL LVL3 (GOWN DISPOSABLE) ×1 IMPLANT
GOWN STRL REUS W/TWL 2XL LVL3 (GOWN DISPOSABLE) IMPLANT
GOWN STRL REUS W/TWL LRG LVL3 (GOWN DISPOSABLE) ×9
GOWN STRL REUS W/TWL XL LVL3 (GOWN DISPOSABLE) ×3
K-WIRE ORTHOPEDIC 1.4X150L (WIRE) ×9
KIT BASIN OR (CUSTOM PROCEDURE TRAY) ×3 IMPLANT
KIT ROOM TURNOVER OR (KITS) ×3 IMPLANT
KWIRE ORTHOPEDIC 1.4X150L (WIRE) IMPLANT
MANIFOLD NEPTUNE II (INSTRUMENTS) ×3 IMPLANT
NEEDLE 22X1 1/2 (OR ONLY) (NEEDLE) ×3 IMPLANT
NS IRRIG 1000ML POUR BTL (IV SOLUTION) ×3 IMPLANT
PACK ORTHO EXTREMITY (CUSTOM PROCEDURE TRAY) ×3 IMPLANT
PAD ARMBOARD 7.5X6 YLW CONV (MISCELLANEOUS) ×4 IMPLANT
PAD CAST 4YDX4 CTTN HI CHSV (CAST SUPPLIES) ×2 IMPLANT
PADDING CAST COTTON 4X4 STRL (CAST SUPPLIES) ×9
PLATE TUBULAR 1/3 5H (Plate) ×4 IMPLANT
SCREW CANC FT 16X4X2.5XHEX (Screw) IMPLANT
SCREW CANCELLOUS 4.0X14 (Screw) ×2 IMPLANT
SCREW CANCELLOUS 4.0X16MM (Screw) ×3 IMPLANT
SCREW CANCELLOUS FT 4.0X18MM (Screw) ×2 IMPLANT
SCREW CANCELLOUS PT 4.0X36 (Screw) ×2 IMPLANT
SCREW CANN 4.0X50MM (Screw) ×2 IMPLANT
SCREW CANN ASNIS III 4.0X40MM (Screw) ×2 IMPLANT
SCREW CORTEX ST MATTA 3.5X16MM (Screw) ×4 IMPLANT
SCREW CORTEX ST MATTA 3.5X26MM (Screw) ×2 IMPLANT
SCREW CORTEX ST MATTA 3.5X30MM (Screw) ×2 IMPLANT
SCREW CORTEX ST MATTA 3.5X34MM (Screw) ×2 IMPLANT
SCREW CORTEX ST MATTA 3.5X36MM (Screw) ×2 IMPLANT
SPONGE GAUZE 4X4 12PLY STER LF (GAUZE/BANDAGES/DRESSINGS) ×4 IMPLANT
SPONGE LAP 18X18 X RAY DECT (DISPOSABLE) ×3 IMPLANT
STRIP CLOSURE SKIN 1/2X4 (GAUZE/BANDAGES/DRESSINGS) ×1 IMPLANT
SUCTION FRAZIER TIP 10 FR DISP (SUCTIONS) ×3 IMPLANT
SUT ETHILON 3 0 PS 1 (SUTURE) ×14 IMPLANT
SUT MNCRL AB 4-0 PS2 18 (SUTURE) ×4 IMPLANT
SUT MON AB 2-0 CT1 27 (SUTURE) ×1 IMPLANT
SUT VIC AB 0 CT1 27 (SUTURE) ×3
SUT VIC AB 0 CT1 27XBRD ANBCTR (SUTURE) IMPLANT
SYR BULB IRRIGATION 50ML (SYRINGE) ×1 IMPLANT
SYR CONTROL 10ML LL (SYRINGE) ×2 IMPLANT
TOWEL OR 17X24 6PK STRL BLUE (TOWEL DISPOSABLE) ×3 IMPLANT
TOWEL OR 17X26 10 PK STRL BLUE (TOWEL DISPOSABLE) ×3 IMPLANT
TUBE CONNECTING 12'X1/4 (SUCTIONS) ×3
TUBE CONNECTING 12X1/4 (SUCTIONS) ×4 IMPLANT
UNDERPAD 30X30 INCONTINENT (UNDERPADS AND DIAPERS) ×5 IMPLANT
WATER STERILE IRR 1000ML POUR (IV SOLUTION) ×1 IMPLANT
YANKAUER SUCT BULB TIP NO VENT (SUCTIONS) ×2 IMPLANT

## 2015-02-14 NOTE — Progress Notes (Signed)
Follow up - Trauma and Critical Care  Patient Details:    Jeremy Burgess is an 49 y.o. male.  Lines/tubes : Airway 7.5 mm (Active)  Secured at (cm) 23 cm 02/14/2015  4:12 AM  Measured From Lips 02/14/2015  4:12 AM  Secured Location Left 02/14/2015  4:12 AM  Secured By [employer] 02/14/2015  4:12 AM  Tube Holder Repositioned Yes 02/14/2015  4:12 AM  Cuff Pressure (cm H2O) 28 cm H2O 02/11/2015  7:05 PM  Site Condition Dry 02/14/2015  4:12 AM     PICC Triple Lumen 70/96/28 PICC Left Basilic 42 cm 0 cm (Active)  Indication for Insertion or Continuance of Line Prolonged intravenous therapies 02/14/2015  7:30 AM  Exposed Catheter (cm) 0 cm 02/10/2015  6:52 PM  Site Assessment Clean;Dry;Intact 02/14/2015  7:30 AM  Lumen #1 Status Infusing 02/14/2015  7:30 AM  Lumen #2 Status Infusing 02/14/2015  7:30 AM  Lumen #3 Status Infusing 02/14/2015  7:30 AM  Dressing Type Transparent 02/14/2015  7:30 AM  Dressing Status Clean;Dry;Intact;Antimicrobial disc in place 02/14/2015  7:30 AM  Line Care Connections checked and tightened 02/14/2015  7:30 AM  Dressing Change Due 02/17/15 02/13/2015  9:00 PM     Arterial Line 02/08/15 Left Radial (Active)  Site Assessment Clean;Dry;Intact 02/14/2015  7:30 AM  Line Status Positional;Pulsatile blood flow 02/14/2015  7:30 AM  Art Line Waveform Dampened 02/14/2015  7:30 AM  Art Line Interventions Zeroed and calibrated;Leveled 02/14/2015  7:30 AM  Color/Movement/Sensation Capillary refill less than 3 sec 02/14/2015  7:30 AM  Dressing Type Transparent;Occlusive 02/14/2015  7:30 AM  Dressing Status Clean;Dry;Intact 02/14/2015  7:30 AM     Chest Tube 1 Right Pleural 28 Fr. (Active)  Suction -20 cm H2O 02/14/2015  7:30 AM  Chest Tube Air Leak None 02/14/2015  7:30 AM  Patency Intervention Tip/tilt 02/08/2015  8:00 PM  Drainage Description Serosanguineous 02/14/2015  7:30 AM  Dressing Status Clean;Dry;Intact 02/14/2015  7:30 AM  Dressing Intervention New dressing 02/13/2015  5:00 AM  Site Assessment  Clean;Dry;Intact 02/13/2015  8:00 PM  Surrounding Skin Unable to view 02/13/2015  8:00 PM  Output (mL) 20 mL 02/14/2015  6:00 AM     Chest Tube 2 Pleural 32 Fr. (Active)  Suction -20 cm H2O 02/14/2015  7:30 AM  Chest Tube Air Leak None 02/14/2015  7:30 AM  Patency Intervention Tip/tilt 02/13/2015  8:00 PM  Drainage Description Serosanguineous 02/14/2015  7:30 AM  Dressing Status Clean;Dry;Intact 02/14/2015  7:30 AM  Dressing Intervention New dressing 02/13/2015  5:00 AM  Site Assessment Clean;Dry;Intact 02/13/2015  8:00 PM  Surrounding Skin Unable to view 02/13/2015  8:00 PM  Output (mL) 250 mL 02/14/2015  6:00 AM     NG/OG Tube Orogastric Center mouth (Active)  Placement Verification Auscultation 02/14/2015  7:30 AM  Site Assessment Clean;Dry;Intact 02/14/2015  7:30 AM  Status Clamped 02/14/2015  7:30 AM  Drainage Appearance Tan 02/13/2015  8:00 PM  Gastric Residual 0 mL 02/14/2015  4:00 AM  Intake (mL) 120 mL 02/13/2015 10:00 PM  Output (mL) 125 mL 02/10/2015  6:00 PM     Urethral Catheter Jeremy Burgess Straight-tip;Temperature probe 16 Fr. (Active)  Indication for Insertion or Continuance of Catheter Peri-operative use for selective surgical procedure;Other (comment) 02/14/2015  7:30 AM  Site Assessment Clean;Intact;Dry 02/14/2015  7:30 AM  Catheter Maintenance Bag below level of bladder;Catheter secured;Drainage bag/tubing not touching floor;Insertion date on drainage bag;Seal intact;No dependent loops 02/14/2015  7:30 AM  Collection Container Standard  drainage bag 02/14/2015  7:30 AM  Securement Method Securing device (Describe) 02/14/2015  7:30 AM  Urinary Catheter Interventions Unclamped 02/14/2015  7:30 AM  Output (mL) 155 mL 02/14/2015  7:30 AM    Microbiology/Sepsis markers: Results for orders placed or performed during the hospital encounter of 02/08/15  MRSA PCR Screening     Status: None   Collection Time: 02/08/15  3:37 PM  Result Value Ref Range Status   MRSA by PCR NEGATIVE NEGATIVE Final    Comment:        The  GeneXpert MRSA Assay (FDA approved for NASAL specimens only), is one component of a comprehensive MRSA colonization surveillance program. It is not intended to diagnose MRSA infection nor to guide or monitor treatment for MRSA infections.   Culture, respiratory (NON-Expectorated)     Status: None   Collection Time: 02/09/15  7:40 AM  Result Value Ref Range Status   Specimen Description TRACHEAL ASPIRATE  Final   Special Requests NONE  Final   Gram Stain   Final    FEW WBC PRESENT, PREDOMINANTLY MONONUCLEAR RARE SQUAMOUS EPITHELIAL CELLS PRESENT FEW GRAM POSITIVE COCCI IN PAIRS IN CLUSTERS Performed at [facility]    Culture   Final    MODERATE STAPHYLOCOCCUS AUREUS Note: RIFAMPIN AND GENTAMICIN SHOULD NOT BE USED AS SINGLE DRUGS FOR TREATMENT OF STAPH INFECTIONS. Performed at [facility]    Report Status 02/12/2015 FINAL  Final   Organism ID, Bacteria STAPHYLOCOCCUS AUREUS  Final      Susceptibility   Staphylococcus aureus - MIC*    CLINDAMYCIN <=0.25 SENSITIVE Sensitive     ERYTHROMYCIN <=0.25 SENSITIVE Sensitive     GENTAMICIN <=0.5 SENSITIVE Sensitive     LEVOFLOXACIN 0.25 SENSITIVE Sensitive     OXACILLIN 0.5 SENSITIVE Sensitive     PENICILLIN >=0.5 RESISTANT Resistant     RIFAMPIN <=0.5 SENSITIVE Sensitive     TRIMETH/SULFA <=10 SENSITIVE Sensitive     VANCOMYCIN <=0.5 SENSITIVE Sensitive     TETRACYCLINE <=1 SENSITIVE Sensitive     MOXIFLOXACIN <=0.25 SENSITIVE Sensitive     * MODERATE STAPHYLOCOCCUS AUREUS  Culture, Urine     Status: None   Collection Time: 02/11/15 10:00 AM  Result Value Ref Range Status   Specimen Description URINE, CATHETERIZED  Final   Special Requests NONE  Final   Culture NO GROWTH 1 DAY  Final   Report Status 02/12/2015 FINAL  Final  Culture, blood (routine x 2)     Status: None (Preliminary result)   Collection Time: 02/12/15 10:30 AM  Result Value Ref Range Status   Specimen Description BLOOD RIGHT ANTECUBITAL   Final   Special Requests BOTTLES DRAWN AEROBIC AND ANAEROBIC 10CC  Final   Culture NO GROWTH 1 DAY  Final   Report Status PENDING  Incomplete  Culture, blood (routine x 2)     Status: None (Preliminary result)   Collection Time: 02/12/15 10:45 AM  Result Value Ref Range Status   Specimen Description BLOOD RIGHT HAND  Final   Special Requests BOTTLES DRAWN AEROBIC ONLY 3CC  Final   Culture NO GROWTH 1 DAY  Final   Report Status PENDING  Incomplete  Culture, respiratory (NON-Expectorated)     Status: None   Collection Time: 02/12/15 12:06 PM  Result Value Ref Range Status   Specimen Description TRACHEAL ASPIRATE  Final   Special Requests Normal  Final   Gram Stain   Final    FEW WBC PRESENT, PREDOMINANTLY PMN NO  SQUAMOUS EPITHELIAL CELLS SEEN NO ORGANISMS SEEN Performed at [facility]    Culture   Final    Non-Pathogenic Oropharyngeal-type Flora Isolated. Performed at [facility]    Report Status 02/14/2015 FINAL  Final    Anti-infectives:  Anti-infectives    Start     Dose/Rate Route Frequency Ordered Stop   02/14/15 0700  ceFAZolin (ANCEF) IVPB 2 g/50 mL premix     2 g 100 mL/hr over 30 Minutes Intravenous On call to O.R. 02/13/15 1326 02/14/15 0805   02/14/15 0600  ceFAZolin (ANCEF) IVPB 2 g/50 mL premix  Status:  Discontinued     2 g 100 mL/hr over 30 Minutes Intravenous On call to O.R. 02/13/15 1324 02/13/15 1326   02/12/15 1400  piperacillin-tazobactam (ZOSYN) IVPB 3.375 g     3.375 g 12.5 mL/hr over 240 Minutes Intravenous Every 8 hours 02/12/15 0854     02/12/15 1045  vancomycin (VANCOCIN) 1,250 mg in sodium chloride 0.9 % 250 mL IVPB     1,250 mg 166.7 mL/hr over 90 Minutes Intravenous Every 12 hours 02/12/15 1040     02/09/15 2030  ceFAZolin (ANCEF) IVPB 2 g/50 mL premix     2 g 100 mL/hr over 30 Minutes Intravenous Every 6 hours 02/09/15 1841 02/10/15 0910   02/09/15 1230  ceFAZolin (ANCEF) IVPB 2 g/50 mL premix     2 g 100 mL/hr over 30  Minutes Intravenous To [facility]Stay [facility] 02/08/15 1418 02/09/15 1425      Best Practice/Protocols:  VTE Prophylaxis: Mechanical GI Prophylaxis: Proton Pump Inhibitor Continous Sedation  Consults: Treatment Team:  Trauma Md, MD [name], MD Ivin Poot, MD    Events:  Subjective:    Overnight Issues: Patient just coming back from the OR.  Heavily sedated.  Some difficulty ventilating in the OR for a bit, improved with bronchodilator and relaxatnion  Objective:  Vital signs for last 24 hours: Temp:  [99.5 F (37.5 C)-100.9 F (38.3 C)] 100.9 F (38.3 C) (07/05 0734) Pulse Rate:  [61-78] 66 (07/05 0700) Resp:  [10-23] 18 (07/05 0700) BP: (118-179)/(51-80) 130/58 mmHg (07/05 0700) SpO2:  [92 %-100 %] 95 % (07/05 0700) Arterial Line BP: (103-209)/(53-136) 174/58 mmHg (07/05 0700) FiO2 (%):  [40 %] 40 % (07/05 0700) Weight:  [121.3 kg (267 lb 6.7 oz)] 121.3 kg (267 lb 6.7 oz) (07/05 0400)  Hemodynamic parameters for last 24 hours:    Intake/Output from previous day: 07/04 0701 - 07/05 0700 In: 4159 [I.V.:2854.5; NG/GT:642; IV Piggyback:662.5] Out: 2830 [Urine:2230; Chest Tube:600]  Intake/Output this shift: Total I/O In: 900 [I.V.:900] Out: 305 [Urine:305]  Vent settings for last 24 hours: Vent Mode:  [-] PRVC FiO2 (%):  [40 %] 40 % Set Rate:  [20 bmp] 20 bmp Vt Set:  [520 mL] 520 mL PEEP:  [8 cmH20] 8 cmH20 Plateau Pressure:  [22 cmH20-26 cmH20] 26 cmH20  Physical Exam:  General: no respiratory distress Neuro: RASS -2 Resp: wheezes bilaterally GI: soft, nontender, BS WNL, no r/g and will restart tube feedings. Extremities: right arm and leg splinted.  Results for orders placed or performed during the hospital encounter of 02/08/15 (from the past 24 hour(s))  Glucose, capillary     Status: Abnormal   Collection Time: 02/13/15 11:30 AM  Result Value Ref Range   Glucose-Capillary 169 (H) 65 - 99 mg/dL  Glucose, capillary     Status: Abnormal    Collection Time: 02/13/15  3:30 PM  Result Value  Ref Range   Glucose-Capillary 159 (H) 65 - 99 mg/dL  Glucose, capillary     Status: Abnormal   Collection Time: 02/13/15  7:46 PM  Result Value Ref Range   Glucose-Capillary 176 (H) 65 - 99 mg/dL  Glucose, capillary     Status: Abnormal   Collection Time: 02/14/15 12:43 AM  Result Value Ref Range   Glucose-Capillary 176 (H) 65 - 99 mg/dL  Glucose, capillary     Status: Abnormal   Collection Time: 02/14/15  3:47 AM  Result Value Ref Range   Glucose-Capillary 164 (H) 65 - 99 mg/dL  CBC     Status: Abnormal   Collection Time: 02/14/15  5:30 AM  Result Value Ref Range   WBC 11.7 (H) 4.0 - 10.5 K/uL   RBC 3.27 (L) 4.22 - 5.81 MIL/uL   Hemoglobin 9.4 (L) 13.0 - 17.0 g/dL   HCT 28.5 (L) 39.0 - 52.0 %   MCV 87.2 78.0 - 100.0 fL   MCH 28.7 26.0 - 34.0 pg   MCHC 33.0 30.0 - 36.0 g/dL   RDW 14.8 11.5 - 15.5 %   Platelets 150 150 - 400 K/uL  Basic metabolic panel     Status: Abnormal   Collection Time: 02/14/15  5:30 AM  Result Value Ref Range   Sodium 144 135 - 145 mmol/L   Potassium 3.8 3.5 - 5.1 mmol/L   Chloride 111 101 - 111 mmol/L   CO2 27 22 - 32 mmol/L   Glucose, Bld 187 (H) 65 - 99 mg/dL   BUN 31 (H) 6 - 20 mg/dL   Creatinine, Ser 0.99 0.61 - 1.24 mg/dL   Calcium 8.0 (L) 8.9 - 10.3 mg/dL   GFR calc non Af Amer >60 >60 mL/min   GFR calc Af Amer >60 >60 mL/min   Anion gap 6 5 - 15  Glucose, capillary     Status: Abnormal   Collection Time: 02/14/15  7:31 AM  Result Value Ref Range   Glucose-Capillary 165 (H) 65 - 99 mg/dL     Assessment/Plan:   NEURO  Altered Mental Status:  sedation   Plan: No intention to wean sedation currently.  PULM  Atelectasis/collapse (focal) Chest Wall Trauma muliple displaced right rib fractures, Lung Trauma (right, with contusion of lung and with hemothorax) and Pneumothorax (traumatic)   Plan: Increase suction of remaining right CT to -30.  CXR pending.  Remove chest tube in fissure.   CARDIO  No significant issues   Plan: CPM  RENAL  No issues   Plan: CPM  GI  No specific issues   Plan: CPM  ID  All cultures negative.   Plan: CPM  HEME  Anemia anemia of critical illness)   Plan: CPM  ENDO No specific issues   Plan: CPM  Global Issues  Patient just back from OR.  Not sure when rib fractures are going to be addressed per thoracic surgery.  Chest tube in the fissure is not draining and will be removed.  The other chest tube will be placed on -30 suction.  CXR today and tomorrow.  Wheezing a lot.    LOS: 5 days   Additional comments:I reviewed the patient's new clinical lab test results. cbc/bmet and I reviewed the patients new imaging test results. cxr  Critical Care Total Time*: 30 Minutes  Shatavia Santor 02/14/2015  *Care during the described time interval was provided by me and/or other providers on the critical care team.  I have reviewed this  patient's available data, including medical history, events of note, physical examination and test results as part of my evaluation.

## 2015-02-14 NOTE — Clinical Documentation Improvement (Signed)
"  Concer for pneumonia" is documented on 02/12/15.  Vancomycin added to IV Zosyn on 02/12/15.  CXR 02/11/15 showed a new dense left base consolidation.  Repeat CXR 02/14/15 showed consolidation in the left lower lobe with volume loss.  Please document if the diagnosis of Pneumonia has or has been:   - Confirmed and should be coded for this admission  - Remains Suspected, Probable or Likely and is being actively treated this admission  - Ruled out and is not applicable to this admission   Thank You, Erling Conte ,RN Clinical Documentation Specialist [phone] [facility]

## 2015-02-14 NOTE — Interval H&P Note (Signed)
History and Physical Interval Note:  02/14/2015 7:20 AM  Jeremy Burgess  has presented today for surgery, with the diagnosis of RIGHT PILON FRACTURE  The various methods of treatment have been discussed with the patient and family. After consideration of risks, benefits and other options for treatment, the patient has consented to  Procedure(s): OPEN REDUCTION INTERNAL FIXATION (ORIF) PILON FRACTURE (Right) REMOVAL EXTERNAL FIXATION LEG (Right) as a surgical intervention .  The patient's history has been reviewed, patient examined, no change in status, stable for surgery.  I have reviewed the patient's chart and labs.  Questions were answered to the patient's satisfaction.     Harla Mensch D

## 2015-02-14 NOTE — Anesthesia Postprocedure Evaluation (Signed)
  Anesthesia Post-op Note  Patient: Jeremy Burgess  Procedure(s) Performed: Procedure(s): OPEN REDUCTION INTERNAL FIXATION (ORIF) PILON FRACTURE (Right) REMOVAL EXTERNAL FIXATION LEG (Right)  Patient Location: SICU  Anesthesia Type:General  Level of Consciousness: Patient remains intubated per anesthesia plan  Airway and Oxygen Therapy: Patient remains intubated per anesthesia plan and Patient placed on Ventilator (see vital sign flow sheet for setting)  Post-op Pain: none  Post-op Assessment: Post-op Vital signs reviewed, Patient's Cardiovascular Status Stable, Respiratory Function Stable, Patent Airway, No signs of Nausea or vomiting and Pain level controlled LLE Motor Response: Purposeful movement, Responds to commands   RLE Motor Response: Purposeful movement, Responds to commands        Post-op Vital Signs: stable  Last Vitals:  Filed Vitals:   02/14/15 1116  BP:   Pulse:   Temp: 36.7 C  Resp:     Complications: No apparent anesthesia complications

## 2015-02-14 NOTE — Op Note (Signed)
02/08/2015 - 02/14/2015  10:44 AM  PATIENT:  Jeremy Burgess    PRE-OPERATIVE DIAGNOSIS:  RIGHT PILON FRACTURE  POST-OPERATIVE DIAGNOSIS:  Same  PROCEDURE:  OPEN REDUCTION INTERNAL FIXATION (ORIF) PILON FRACTURE, REMOVAL EXTERNAL FIXATION LEG  SURGEON:  MURPHY, Ernesta Amble, MD  ASSISTANT: Lovett Calender, PA-C, She was present and scrubbed throughout the case, critical for completion in a timely fashion, and for retraction, instrumentation, and closure.   ANESTHESIA:   gen  PREOPERATIVE INDICATIONS:  Jeremy Burgess is a  49 y.o. male with a diagnosis of RIGHT PILON FRACTURE who failed conservative measures and elected for surgical management.    The risks benefits and alternatives were discussed with the patient preoperatively including but not limited to the risks of infection, bleeding, nerve injury, cardiopulmonary complications, the need for revision surgery, among others, and the patient was willing to proceed.  OPERATIVE IMPLANTS: 1/3 tubular plate x2, canulated screws  OPERATIVE FINDINGS: pilon fracture  BLOOD LOSS: min  COMPLICATIONS: none  TOURNIQUET TIME: 70min  OPERATIVE PROCEDURE:  Patient was identified in the preoperative holding area and site was marked by me He was transported to the operating theater and placed on the table in supine position taking care to pad all bony prominences. After a preincinduction time out anesthesia was induced. The right lower extremity was prepped and draped in normal sterile fashion and a pre-incision timeout was performed. He received ancef for preoperative antibiotics.   I prepped his ex-fix sites and removed the ex-fix completely came out hole.  We then completed prepping and draping of the right lower extremity. I started on his medial side.  We inflated the tourniquet after exsanguinating his extremity.  I made a medial incision of roughly 7 cm over his medial Mal fracture identified the fracture I did book it open to see if there  was any bit of the joint that could be improved. There was some component impacted cortical bone in the very posterior aspect that had no cartilage on it. There were a few small pieces of cartilage that had been scraped off with no bone behind him I removed these. I then reduced the medial Mal and medial side of the joint anatomically back to the plafond.  I then advanced 2 K wires across this and placed 2 cannulated screws to keep it from shifting medially.  Next I used a 5 hole one third tubular plate and it and to glide fashion I had excellent purchase with 3 screws above the fracture I placed a partially threaded screw at the distal Endobutton of the plate to aid in compression of the fracture posteriorly. Took multiple x-rays and was very happy with the fixation on this.  I then thoroughly irrigated the medial side and closed using a 3-0 nylon.  I then turned my attention laterally to the fibula. I made a 5 cm incision here and dissected down sharply to his medial Mal I spread superficial superficially and proximally to protect the superficial peroneal nerve. Identified his fracture site cleaned of soft tissue and held it anatomically reduced. I then contoured a one third tubular plate and placed it with 3 excellent screw bites above it into cancellus screws in the distal piece. I then took final x-rays and was very happy with the fracture reduction and placement of all hardware. I thoroughly irrigated both wounds and closed them with a oh Vicodin a 3-0 nylon. Sterile dressings were applied to all incisions and previous ex-fix sites and he was laced in a  short leg splint and taken the PACU in stable condition.  POST OPERATIVE PLAN: NWB RLE, WBAT Left elbow, heparin for dvt px.     This note was generated using a template and dragon dictation system. In light of that, I have reviewed the note and all aspects of it are applicable to this case. Any dictation errors are due to the computerized  dictation system.

## 2015-02-14 NOTE — Progress Notes (Signed)
Occupational Therapy Treatment Patient Details Name: Jeremy Burgess MRN: [id number] DOB: 12/26/1965 Today's Date: 02/14/2015    History of present illness Pt admitted after large tree limb fell on him.  He sustained Rt displaced rib fractures 2-9, moderate Rt pneumothorax/pleural effusion - pt orally intubated, Rt CT placed; Rt femur  fracture s/p IMN ( knee and hip ROM as tolerated); Rt ankle/pilon  fracture s/p ex fix with plan for surgery 02/13/14 NWB Rt LE; Rt ulna fracture s/p ORIF WBAT through elbow, ? small liver laceration; hematoma quad/adductor muscles.  PMH includes: IDDM, HTN s    OT comments  ROM performed MCPs Rt hand, splint adjusted.  Pt then with drop in 02 sats before OT/PT could begin to mobilize pt.    Follow Up Recommendations  CIR;Supervision/Assistance - 24 hour    Equipment Recommendations  None recommended by OT    Recommendations for Other Services      Precautions / Restrictions Precautions Precautions: Other (comment) Precaution Comments: Rt rib fractures, CTs, orally intubated  Restrictions Weight Bearing Restrictions: Yes RUE Weight Bearing: Weight bearing as tolerated RLE Weight Bearing: Non weight bearing       Mobility Bed Mobility                  Transfers                      Balance                                   ADL                                                Vision                     Perception     Praxis      Cognition   Behavior During Therapy: Anxious Overall Cognitive Status: Difficult to assess                       Extremity/Trunk Assessment               Exercises Other Exercises Other Exercises: RN asked OT to check splint that was applied to Rt wrist.  Wrist cock up splint in place.  It fits a bit high on the MCPs,  Adjusted splint and appears to fit reasonably well.  Would not recommend Fabrication of splint at this time due to pt on  vent and with likely fluctuations in edema.   Pt with limited MCP flexion actively.  PROM performed.  Pt indicates that he has an old injury that limits ROM of MCPs, but pt with drop in 02 sats and STAT CT performed so unable to determine if injury effected all digits.    Shoulder Instructions       General Comments      Pertinent Vitals/ Pain       Pain Assessment: Faces Faces Pain Scale: Hurts even more Pain Location: vague to LE ROM Pain Descriptors / Indicators: Grimacing Pain Intervention(s): Monitored during session  Home Living  Prior Functioning/Environment              Frequency Min 3X/week     Progress Toward Goals  OT Goals(current goals can now be found in the care plan section)  Progress towards OT goals: Not progressing toward goals - comment (desaturation )  ADL Goals Pt Will Perform Grooming: with min assist;sitting Pt Will Perform Upper Body Bathing: with min assist;sitting Pt Will Perform Lower Body Bathing: with mod assist;sit to/from stand;with adaptive equipment Pt Will Perform Upper Body Dressing: with min assist;sitting Pt Will Perform Lower Body Dressing: with mod assist;with adaptive equipment;sit to/from stand Pt Will Transfer to Toilet: with mod assist;stand pivot transfer;bedside commode Pt Will Perform Toileting - Clothing Manipulation and hygiene: with mod assist;sit to/from stand  Plan Discharge plan remains appropriate    Co-evaluation                 End of Session Equipment Utilized During Treatment: Other (comment) (splint)   Activity Tolerance Other (comment) (decreased in 02 sats )   Patient Left in bed;with call bell/phone within reach;with nursing/sitter in room   Nurse Communication Other (comment) (splint fit)        Time: 2902-1115 OT Time Calculation (min): 8 min  Charges: OT General Charges $OT Visit: 1 Procedure OT Treatments $Therapeutic  Activity: 8-22 mins  Jeremy Burgess M 02/14/2015, 4:04 PM

## 2015-02-14 NOTE — H&P (View-Only) (Signed)
[facility] Progress Note  Subjective  Intubated but awake Nod[place] to answer questions  ROS Pain controlled   Objective   BP 128/52 mmHg  Pulse 79  Temp(Src) 101.3 F (38.5 C) (Core (Comment))  Resp 18  Ht 6' 2" (1.88 m)  Wt 110.7 kg (244 lb 0.8 oz)  BMI 31.32 kg/m2  SpO2 94%  Intake/Output      07/01 0701 - 07/02 0700 07/02 0701 - 07/03 0700   I.V. (mL/kg) 3255 (29.4)    Blood     NG/GT 140    IV Piggyback 50    Total Intake(mL/kg) 3445 (31.1)    Urine (mL/kg/hr) 3510 (1.3)    Emesis/NG output 125 (0)    Blood     Chest Tube 310 (0.1)    Total Output 3945     Net -500            Labs  Results for Burgess, Jeremy (MRN [id number]) as of 02/11/2015 09:05  Ref. Range 02/11/2015 04:29  Sodium Latest Ref Range: 135-145 mmol/L 146 (H)  Potassium Latest Ref Range: 3.5-5.1 mmol/L 4.0  Chloride Latest Ref Range: 101-111 mmol/L 114 (H)  CO2 Latest Ref Range: 22-32 mmol/L 23  BUN Latest Ref Range: 6-20 mg/dL 26 (H)  Creatinine Latest Ref Range: 0.61-1.24 mg/dL 1.39 (H)  Calcium Latest Ref Range: 8.9-10.3 mg/dL 7.3 (L)  EGFR (Non-African Amer.) Latest Ref Range: >60 mL/min 57 (L)  EGFR (African American) Latest Ref Range: >60 mL/min >60  Glucose Latest Ref Range: 65-99 mg/dL 149 (H)  Anion gap Latest Ref Range: 5-15  9  Triglycerides Latest Ref Range: <150 mg/dL 126  WBC Latest Ref Range: 4.0-10.5 K/uL 9.6  RBC Latest Ref Range: 4.22-5.81 MIL/uL 3.22 (L)  Hemoglobin Latest Ref Range: 13.0-17.0 g/dL 8.9 (L)  HCT Latest Ref Range: 39.0-52.0 % 27.0 (L)  MCV Latest Ref Range: 78.0-100.0 fL 83.9  MCH Latest Ref Range: 26.0-34.0 pg 27.6  MCHC Latest Ref Range: 30.0-36.0 g/dL 33.0  RDW Latest Ref Range: 11.5-15.5 % 15.0  Platelets Latest Ref Range: 150-400 K/uL 105 (L)   Results for Burgess, Jeremy (MRN [id number]) as of 02/11/2015 09:05  Ref. Range 02/11/2015 04:29  Neutrophils Latest Ref Range: 43-77 % 79 (H)  Lymphocytes Latest Ref Range: 12-46 % 11 (L)   Monocytes Relative Latest Ref Range: 3-12 % 10  Eosinophil Latest Ref Range: 0-5 % 0  Basophil Latest Ref Range: 0-1 % 0  NEUT# Latest Ref Range: 1.7-7.7 K/uL 7.6  Lymphocyte # Latest Ref Range: 0.7-4.0 K/uL 1.1  Monocyte # Latest Ref Range: 0.1-1.0 K/uL 0.9  Eosinophils Absolute Latest Ref Range: 0.0-0.7 K/uL 0.0  Basophils Absolute Latest Ref Range: 0.0-0.1 K/uL 0.0   Results for Burgess, Jeremy (MRN [id number]) as of 02/11/2015 09:05  Ref. Range 02/11/2015 04:30  Appearance Latest Ref Range: CLEAR  CLOUDY (A)  Bacteria, UA Latest Ref Range: RARE  FEW (A)  Bilirubin Urine Latest Ref Range: NEGATIVE  NEGATIVE  Casts Latest Ref Range: NEGATIVE  GRANULAR CAST (A)  Color, Urine Latest Ref Range: YELLOW  AMBER (A)  Crystals Latest Ref Range: NEGATIVE  URIC ACID CRYSTALS (A)  Glucose Latest Ref Range: NEGATIVE mg/dL 100 (A)  Hgb urine dipstick Latest Ref Range: NEGATIVE  LARGE (A)  Ketones, ur Latest Ref Range: NEGATIVE mg/dL 15 (A)  Leukocytes, UA Latest Ref Range: NEGATIVE  NEGATIVE  Nitrite Latest Ref Range: NEGATIVE  NEGATIVE  pH Latest Ref Range: 5.0-8.0  5.0  Protein Latest Ref Range: NEGATIVE   mg/dL 100 (A)  RBC / HPF Latest Ref Range: <3 RBC/hpf 7-10  Specific Gravity, Urine Latest Ref Range: 1.005-1.030  1.020  Urine-Other Unknown AMORPHOUS URATES/...  Urobilinogen, UA Latest Ref Range: 0.0-1.0 mg/dL 0.2    Exam  Gen: intubated but awake, alert and interactive  Lungs: coarse B  Cardiac: s1 and s2 Abd: + BS, NT Pelvis: stable  Ext:       Right Upper Extremity   Dressing c/d/i  R/U/M/AIN/PIN motor intact  R/U/M sens intact  Ext warm  Swelling controlled  + radial pulse        Right Lower Extremity   Dressings stable  Ex fix R ankle stable  Some bloody drainage noted at tibial pin dressings   Moderate swelling still present R ankle   DPN, SPN, TN sensation intact  EHL, FHL, lesser toe motor functions intact  Ext warm, + DP pulse  No pain with passive stretch        Assessment and Plan   POD/HD#: 2  49 y/o male s/p tree injury   1. R femoral shaft fracture   S/p IMN   NWB due to ipsilateral pilon fracture  Dressing changes as needed  Knee and hip ROM as tolerated   2. R pilon fracture   S/p ex fix  CT pending  Delay CT for today due to worsening pulmonary status  Plan for OR Tuesday with Dr. Percell Miller  Aggressive Ice and elevate on to help with swelling control  Still with moderate swelling present   3.  R ulna fx  S/p ORIF   WBAT thru elbow   Dressing changes as needed   4. DVT/PE prophylaxis  Lovenox while inpt  May want to consider coumadin at dc given constellation of injuries   5. Fever   likley related to pulm issues  U/a + bacteria but neg nitrites and LE  Order urine cx   6. Displaced R rib fx with HPTX  Per CTS   Possible rib plating later this week  7. Medical issues  Per TS  8. Renal failure (acute possibly on chronic disease)  Per renal  9. Dispo  Continue per other services  OR possible Tuesday for ORIF R pilon  CT scan needs to be done pre-op    Jari Pigg, PA-C [facility] [phone] (O) 02/11/2015 9:04 AM

## 2015-02-14 NOTE — Transfer of Care (Signed)
Immediate Anesthesia Transfer of Care Note  Patient: Jeremy Burgess  Procedure(s) Performed: Procedure(s): OPEN REDUCTION INTERNAL FIXATION (ORIF) PILON FRACTURE (Right) REMOVAL EXTERNAL FIXATION LEG (Right)  Patient Location: PACU and NICU  Anesthesia Type:General  Level of Consciousness: Patient remains intubated per anesthesia plan  Airway & Oxygen Therapy: Patient remains intubated per anesthesia plan and Patient placed on Ventilator (see vital sign flow sheet for setting)  Post-op Assessment: Report given to RN and Post -op Vital signs reviewed and stable  Post vital signs: Reviewed and stable  Last Vitals:  Filed Vitals:   02/14/15 0734  BP:   Pulse:   Temp: 38.3 C  Resp:     Complications: No apparent anesthesia complications

## 2015-02-14 NOTE — Clinical Social Work Note (Signed)
CSW notes that PT has recommended inpatient rehab. CSW will continue to follow to complete assessment and SBIRT with patient when/if appropriate. If CIR admission is not likely, CSW will assist with looking for SNF placement.   Liz Beach MSW, [place], [place], [phone]

## 2015-02-14 NOTE — Progress Notes (Signed)
Physical Therapy Treatment Patient Details Name: Jeremy Burgess MRN: [id number] DOB: 10-13-1965 Today's Date: 02/14/2015    History of Present Illness Pt admitted after large tree limb fell on him.  He sustained Rt displaced rib fractures 2-9, moderate Rt pneumothorax/pleural effusion - pt orally intubated, Rt CT placed; Rt femur  fracture s/p IMN ( knee and hip ROM as tolerated); Rt ankle/pilon  fracture s/p ex fix with plan for surgery 02/13/14 [facility]B Rt LE; Rt ulna fracture s/p ORIF WBAT through elbow, ? small liver laceration; hematoma quad/adductor muscles.  PMH includes: IDDM, HTN s     PT Comments    Limited treatment to P/A/resisted ROM to bil LE's  Follow Up Recommendations  CIR     Equipment Recommendations  Other (comment)    Recommendations for Other Services Rehab consult     Precautions / Restrictions Precautions Precautions: Other (comment) Precaution Comments: Rt rib fractures, CTs, orally intubated  Restrictions Weight Bearing Restrictions: Yes RUE Weight Bearing: Weight bearing as tolerated RLE Weight Bearing: Non weight bearing    Mobility  Bed Mobility                  Transfers                    Ambulation/Gait                 Stairs            Wheelchair Mobility    Modified Rankin (Stroke Patients Only)       Balance                                    Cognition Arousal/Alertness: Awake/alert Behavior During Therapy: Anxious Overall Cognitive Status: Difficult to assess                      Exercises Other Exercises Other Exercises: RN asked OT to check splint that was applied to Rt wrist.  Wrist cock up splint in place.  It fits a bit high on the MCPs,  Adjusted splint and appears to fit reasonably well.  Would not recommend Fabrication of splint at this time due to pt on vent and with likely fluctuations in edema.   Pt with limited MCP flexion actively.  PROM performed.  Pt indicates that  he has an old injury that limits ROM of MCPs, but pt with drop in 02 sats and STAT CT performed so unable to determine if injury effected all digits.     General Comments General comments (skin integrity, edema, etc.): Attempt to mobilize deferred due to drop in SpO2 to lower 80's      Pertinent Vitals/Pain Pain Assessment: Faces Faces Pain Scale: Hurts even more Pain Location: vague to LE ROM Pain Descriptors / Indicators: Grimacing Pain Intervention(s): Monitored during session    Home Living                      Prior Function            PT Goals (current goals can now be found in the care plan section) Acute Rehab PT Goals Time For Goal Achievement: 02/27/15 Potential to Achieve Goals: Good Progress towards PT goals: Not progressing toward goals - comment (low sats deferred treatment)    Frequency  Min 3X/week    PT Plan      Co-evaluation  End of Session Equipment Utilized During Treatment: Oxygen Activity Tolerance: Other (comment) (deferred due to low sats) Patient left: in bed;with call bell/phone within reach     Time: 1539-1550 PT Time Calculation (min) (ACUTE ONLY): 11 min  Charges:  $Therapeutic Exercise: 8-22 mins                    G Codes:      Meshawn Oconnor, Tessie Fass 02/14/2015, 4:04 PM 02/14/2015  Donnella Sham, [place] [phone]  (pager)

## 2015-02-14 NOTE — Progress Notes (Signed)
Anesthesiology Note:  Mr. Jeremy Burgess is a 49 year old male admitted 6/29 after suffering  injuries when a large tree limb he was cutting fell on him. He  sustained R. Displaced rib fractures 2-9, R. Hemo/pneumothorax, R. Femur fracture, and a R. pilon fracture. He underwent IM nailing of R femur fracture on 6/30 and ORIF of R. pilon fracture today. He has remained intubated since admission and has had pain with PT and difficulty weaning felt due to pain from rib fractures.  I discussed the option of an epidural catheter for better pain control but  he refused despite my explaining the potential benefits.  We will follow-up tomorrow.  [name]

## 2015-02-14 NOTE — Care Management Note (Signed)
Case Management Note  Patient Details  Name: Jeremy Burgess MRN: [id number] Date of Birth: 08-21-1965  Subjective/Objective:     Pt to OR today for ORIF of RT pilon fracture.  Pt remains sedated and on ventilator.                 Action/Plan: Will cont to follow progress.  PT/OT recommending CIR when medically stable.    Expected Discharge Date:                  Expected Discharge Plan:  [place]  In-House Referral:     Discharge planning Services  CM Consult  Post Acute Care Choice:    Choice offered to:     DME Arranged:    DME Agency:     HH Arranged:    [place] Agency:     Status of Service:  In process, will continue to follow  Medicare Important Message Given:    Date Medicare IM Given:    Medicare IM give by:    Date Additional Medicare IM Given:    Additional Medicare Important Message give by:     If discussed at [place] of Stay Meetings, dates discussed:    Additional Comments:  Ella Bodo, RN 02/14/2015, 11:26 AM

## 2015-02-14 NOTE — Discharge Instructions (Signed)
Keep splint clean and dry till follow up  Weight bearing as tolerated in the RUE in order to use a platform walker, Non-weight bearing in the RLE

## 2015-02-14 NOTE — Addendum Note (Signed)
Addendum  created 02/14/15 1853 by [name], MD   Modules edited: Notes Section   Notes Section:  File: [id number]; Pend: [id number]; Raelyn Number: [id number]; Raelyn Number: [id number]; Pend: [id number]

## 2015-02-14 NOTE — Progress Notes (Signed)
Rehab Admissions Coordinator Note:  Patient was screened by Retta Diones for appropriateness for an Inpatient Acute Rehab Consult.  At this time, we are recommending Inpatient Rehab consult.  Jodell Cipro M 02/14/2015, 9:07 AM  I can be reached at [phone].

## 2015-02-14 NOTE — Anesthesia Preprocedure Evaluation (Signed)
Anesthesia Evaluation  Patient identified by MRN, date of birth, ID band Patient unresponsive    Reviewed: Unable to perform ROS - Chart review only  Airway Mallampati: I       Dental   Pulmonary    Pulmonary exam normal   + intubated    Cardiovascular hypertension, Normal cardiovascular exam    Neuro/Psych    GI/Hepatic   Endo/Other  diabetes, Type 2  Renal/GU Renal disease     Musculoskeletal   Abdominal   Peds  Hematology  (+) anemia ,   Anesthesia Other Findings   Reproductive/Obstetrics                             Anesthesia Physical Anesthesia Plan  ASA: II  Anesthesia Plan: General   Post-op Pain Management:    Induction: Intravenous  Airway Management Planned:   Additional Equipment:   Intra-op Plan:   Post-operative Plan:   Informed Consent:   Plan Discussed with: CRNA, Anesthesiologist and Surgeon  Anesthesia Plan Comments:         Anesthesia Quick Evaluation

## 2015-02-15 ENCOUNTER — Inpatient Hospital Stay (HOSPITAL_COMMUNITY): Payer: Self-pay

## 2015-02-15 LAB — POCT I-STAT 3, ART BLOOD GAS (G3+)
ACID-BASE EXCESS: 3 mmol/L — AB (ref 0.0–2.0)
Bicarbonate: 28.3 meq/L — ABNORMAL HIGH (ref 20.0–24.0)
O2 SAT: 97 %
Patient temperature: 38.3
TCO2: 30 mmol/L (ref 0–100)
pCO2 arterial: 47.6 mmHg — ABNORMAL HIGH (ref 35.0–45.0)
pH, Arterial: 7.387 (ref 7.350–7.450)
pO2, Arterial: 95 mmHg (ref 80.0–100.0)

## 2015-02-15 LAB — CBC WITH DIFFERENTIAL/PLATELET
BASOS PCT: 0 % (ref 0–1)
Basophils Absolute: 0 10*3/uL (ref 0.0–0.1)
EOS PCT: 2 % (ref 0–5)
Eosinophils Absolute: 0.2 10*3/uL (ref 0.0–0.7)
HCT: 23.3 % — ABNORMAL LOW (ref 39.0–52.0)
Hemoglobin: 7.4 g/dL — ABNORMAL LOW (ref 13.0–17.0)
LYMPHS ABS: 1 10*3/uL (ref 0.7–4.0)
Lymphocytes Relative: 11 % — ABNORMAL LOW (ref 12–46)
MCH: 27.9 pg (ref 26.0–34.0)
MCHC: 31.8 g/dL (ref 30.0–36.0)
MCV: 87.9 fL (ref 78.0–100.0)
Monocytes Absolute: 0.8 10*3/uL (ref 0.1–1.0)
Monocytes Relative: 9 % (ref 3–12)
NEUTROS PCT: 77 % (ref 43–77)
Neutro Abs: 6.9 10*3/uL (ref 1.7–7.7)
Platelets: 132 10*3/uL — ABNORMAL LOW (ref 150–400)
RBC: 2.65 MIL/uL — AB (ref 4.22–5.81)
RDW: 14.8 % (ref 11.5–15.5)
WBC: 8.9 10*3/uL (ref 4.0–10.5)

## 2015-02-15 LAB — BASIC METABOLIC PANEL
Anion gap: 4 — ABNORMAL LOW (ref 5–15)
BUN: 27 mg/dL — AB (ref 6–20)
CALCIUM: 7.2 mg/dL — AB (ref 8.9–10.3)
CO2: 29 mmol/L (ref 22–32)
Chloride: 113 mmol/L — ABNORMAL HIGH (ref 101–111)
Creatinine, Ser: 0.99 mg/dL (ref 0.61–1.24)
GFR calc non Af Amer: >60 mL/min (ref >60)
GLUCOSE: 209 mg/dL — AB (ref 65–99)
POTASSIUM: 3.3 mmol/L — AB (ref 3.5–5.1)
Sodium: 146 mmol/L — ABNORMAL HIGH (ref 135–145)

## 2015-02-15 LAB — GLUCOSE, CAPILLARY
GLUCOSE-CAPILLARY: 217 mg/dL — AB (ref 65–99)
GLUCOSE-CAPILLARY: 220 mg/dL — AB (ref 65–99)
Glucose-Capillary: 185 mg/dL — ABNORMAL HIGH (ref 65–99)
Glucose-Capillary: 155 mg/dL — ABNORMAL HIGH (ref 65–99)
Glucose-Capillary: 186 mg/dL — ABNORMAL HIGH (ref 65–99)

## 2015-02-15 LAB — PREPARE RBC (CROSSMATCH)

## 2015-02-15 MED ORDER — FUROSEMIDE 10 MG/ML IJ SOLN
40.0000 mg | Freq: Once | INTRAMUSCULAR | Status: AC
  Filled 2015-02-15: qty 4

## 2015-02-15 MED ORDER — FREE WATER
200.0000 mL | Freq: Three times a day (TID) | Status: DC
  Administered 2015-02-15 – 2015-02-16 (×3): 200 mL

## 2015-02-15 MED ORDER — FUROSEMIDE 10 MG/ML IJ SOLN
20.0000 mg | Freq: Once | INTRAMUSCULAR | Status: AC
  Administered 2015-02-15: 20 mg via INTRAVENOUS

## 2015-02-15 MED ORDER — SODIUM CHLORIDE 0.9 % IV SOLN: Freq: Once | INTRAVENOUS | Status: DC

## 2015-02-15 MED ORDER — INSULIN DETEMIR 100 UNIT/ML ~~LOC~~ SOLN
10.0000 [IU] | Freq: Two times a day (BID) | SUBCUTANEOUS | Status: DC
  Administered 2015-02-15 – 2015-02-23 (×17): 10 [IU] via SUBCUTANEOUS
  Filled 2015-02-15 (×22): qty 0.1

## 2015-02-15 NOTE — Progress Notes (Signed)
Follow up - Trauma and Critical Care  Patient Details:    Jeremy Burgess is an 49 y.o. male.  Lines/tubes : Airway 7.5 mm (Active)  Secured at (cm) 23 cm 02/15/2015  7:43 AM  Measured From Lips 02/15/2015  7:43 AM  Secured Location Right 02/15/2015  7:43 AM  Secured By [employer] 02/15/2015  7:43 AM  Tube Holder Repositioned Yes 02/15/2015  7:43 AM  Cuff Pressure (cm H2O) 24 cm H2O 02/15/2015  7:43 AM  Site Condition Dry 02/15/2015  7:43 AM     PICC Triple Lumen 16/10/96 PICC Left Basilic 42 cm 0 cm (Active)  Indication for Insertion or Continuance of Line Prolonged intravenous therapies 02/14/2015  8:00 PM  Exposed Catheter (cm) 0 cm 02/10/2015  6:52 PM  Site Assessment Clean;Dry;Intact 02/14/2015  8:00 PM  Lumen #1 Status Infusing 02/14/2015  8:00 PM  Lumen #2 Status Infusing 02/14/2015  8:00 PM  Lumen #3 Status Flushed;Capped (Central line) 02/14/2015  8:00 PM  Dressing Type Transparent 02/14/2015  8:00 PM  Dressing Status Clean;Dry;Intact;Antimicrobial disc in place 02/14/2015  8:00 PM  Line Care Connections checked and tightened 02/14/2015  8:00 PM  Dressing Change Due 02/17/15 02/14/2015  8:00 PM     Arterial Line 02/08/15 Left Radial (Active)  Site Assessment Clean;Dry;Intact 02/14/2015  8:00 PM  Line Status Positional;Pulsatile blood flow 02/14/2015  8:00 PM  Art Line Waveform Dampened 02/14/2015  8:00 PM  Art Line Interventions Zeroed and calibrated;Leveled;Line pulled back;Flushed per protocol;Connections checked and tightened 02/14/2015  8:00 PM  Color/Movement/Sensation Capillary refill less than 3 sec 02/14/2015  8:00 PM  Dressing Type Transparent;Occlusive 02/14/2015  8:00 PM  Dressing Status Clean;Dry;Intact 02/14/2015  8:00 PM     Chest Tube 2 Pleural 32 Fr. (Active)  Suction -30 cm H2O 02/15/2015  4:00 AM  Chest Tube Air Leak None 02/15/2015  4:00 AM  Patency Intervention Tip/tilt 02/13/2015  8:00 PM  Drainage Description Serosanguineous 02/15/2015  4:25 AM  Dressing Status Clean;Dry;Intact 02/14/2015   8:00 PM  Dressing Intervention Dressing changed 02/14/2015 11:00 AM  Site Assessment Clean;Dry;Intact 02/15/2015  4:00 AM  Surrounding Skin Unable to view 02/15/2015  4:00 AM  Output (mL) 170 mL 02/15/2015  6:00 AM     NG/OG Tube Orogastric Center mouth (Active)  Placement Verification Auscultation 02/15/2015  4:00 AM  Site Assessment Clean;Dry;Intact 02/14/2015  8:00 PM  Status Infusing tube feed 02/14/2015  8:00 PM  Drainage Appearance Tan 02/14/2015  8:00 PM  Gastric Residual 0 mL 02/15/2015  4:00 AM  Intake (mL) 80 mL 02/14/2015 10:00 PM  Output (mL) 125 mL 02/10/2015  6:00 PM     Urethral Catheter [name] Straight-tip;Temperature probe 16 Fr. (Active)  Indication for Insertion or Continuance of Catheter Peri-operative use for selective surgical procedure 02/14/2015  8:00 PM  Site Assessment Clean;Intact;Dry 02/14/2015  8:00 PM  Catheter Maintenance Bag below level of bladder;Catheter secured;Drainage bag/tubing not touching floor;Insertion date on drainage bag;Seal intact;No dependent loops 02/14/2015  8:00 PM  Collection Container Standard drainage bag 02/14/2015  8:00 PM  Securement Method Securing device (Describe) 02/14/2015  8:00 PM  Urinary Catheter Interventions Unclamped 02/14/2015  8:00 PM  Output (mL) 100 mL 02/15/2015  6:00 AM    Microbiology/Sepsis markers: Results for orders placed or performed during the hospital encounter of 02/08/15  MRSA PCR Screening     Status: None   Collection Time: 02/08/15  3:37 PM  Result Value Ref Range Status   MRSA by PCR NEGATIVE NEGATIVE  Final    Comment:        The GeneXpert MRSA Assay (FDA approved for NASAL specimens only), is one component of a comprehensive MRSA colonization surveillance program. It is not intended to diagnose MRSA infection nor to guide or monitor treatment for MRSA infections.   Culture, respiratory (NON-Expectorated)     Status: None   Collection Time: 02/09/15  7:40 AM  Result Value Ref Range Status   Specimen Description  TRACHEAL ASPIRATE  Final   Special Requests NONE  Final   Gram Stain   Final    FEW WBC PRESENT, PREDOMINANTLY MONONUCLEAR RARE SQUAMOUS EPITHELIAL CELLS PRESENT FEW GRAM POSITIVE COCCI IN PAIRS IN CLUSTERS Performed at [facility]    Culture   Final    MODERATE STAPHYLOCOCCUS AUREUS Note: RIFAMPIN AND GENTAMICIN SHOULD NOT BE USED AS SINGLE DRUGS FOR TREATMENT OF STAPH INFECTIONS. Performed at [facility]    Report Status 02/12/2015 FINAL  Final   Organism ID, Bacteria STAPHYLOCOCCUS AUREUS  Final      Susceptibility   Staphylococcus aureus - MIC*    CLINDAMYCIN <=0.25 SENSITIVE Sensitive     ERYTHROMYCIN <=0.25 SENSITIVE Sensitive     GENTAMICIN <=0.5 SENSITIVE Sensitive     LEVOFLOXACIN 0.25 SENSITIVE Sensitive     OXACILLIN 0.5 SENSITIVE Sensitive     PENICILLIN >=0.5 RESISTANT Resistant     RIFAMPIN <=0.5 SENSITIVE Sensitive     TRIMETH/SULFA <=10 SENSITIVE Sensitive     VANCOMYCIN <=0.5 SENSITIVE Sensitive     TETRACYCLINE <=1 SENSITIVE Sensitive     MOXIFLOXACIN <=0.25 SENSITIVE Sensitive     * MODERATE STAPHYLOCOCCUS AUREUS  Culture, Urine     Status: None   Collection Time: 02/11/15 10:00 AM  Result Value Ref Range Status   Specimen Description URINE, CATHETERIZED  Final   Special Requests NONE  Final   Culture NO GROWTH 1 DAY  Final   Report Status 02/12/2015 FINAL  Final  Culture, blood (routine x 2)     Status: None (Preliminary result)   Collection Time: 02/12/15 10:30 AM  Result Value Ref Range Status   Specimen Description BLOOD RIGHT ANTECUBITAL  Final   Special Requests BOTTLES DRAWN AEROBIC AND ANAEROBIC 10CC  Final   Culture NO GROWTH 2 DAYS  Final   Report Status PENDING  Incomplete  Culture, blood (routine x 2)     Status: None (Preliminary result)   Collection Time: 02/12/15 10:45 AM  Result Value Ref Range Status   Specimen Description BLOOD RIGHT HAND  Final   Special Requests BOTTLES DRAWN AEROBIC ONLY 3CC  Final   Culture NO  GROWTH 2 DAYS  Final   Report Status PENDING  Incomplete  Culture, respiratory (NON-Expectorated)     Status: None   Collection Time: 02/12/15 12:06 PM  Result Value Ref Range Status   Specimen Description TRACHEAL ASPIRATE  Final   Special Requests Normal  Final   Gram Stain   Final    FEW WBC PRESENT, PREDOMINANTLY PMN NO SQUAMOUS EPITHELIAL CELLS SEEN NO ORGANISMS SEEN Performed at [facility]    Culture   Final    Non-Pathogenic Oropharyngeal-type Flora Isolated. Performed at [facility]    Report Status 02/14/2015 FINAL  Final    Anti-infectives:  Anti-infectives    Start     Dose/Rate Route Frequency Ordered Stop   02/14/15 1400  ceFAZolin (ANCEF) IVPB 2 g/50 mL premix     2 g 100 mL/hr over 30 Minutes  Intravenous 3 times per day 02/14/15 1105     02/14/15 0700  ceFAZolin (ANCEF) IVPB 2 g/50 mL premix     2 g 100 mL/hr over 30 Minutes Intravenous On call to O.R. 02/13/15 1326 02/14/15 0805   02/14/15 0600  ceFAZolin (ANCEF) IVPB 2 g/50 mL premix  Status:  Discontinued     2 g 100 mL/hr over 30 Minutes Intravenous On call to O.R. 02/13/15 1324 02/13/15 1326   02/12/15 1400  piperacillin-tazobactam (ZOSYN) IVPB 3.375 g  Status:  Discontinued     3.375 g 12.5 mL/hr over 240 Minutes Intravenous Every 8 hours 02/12/15 0854 02/14/15 1105   02/12/15 1045  vancomycin (VANCOCIN) 1,250 mg in sodium chloride 0.9 % 250 mL IVPB  Status:  Discontinued     1,250 mg 166.7 mL/hr over 90 Minutes Intravenous Every 12 hours 02/12/15 1040 02/14/15 1105   02/09/15 2030  ceFAZolin (ANCEF) IVPB 2 g/50 mL premix     2 g 100 mL/hr over 30 Minutes Intravenous Every 6 hours 02/09/15 1841 02/10/15 0910   02/09/15 1230  ceFAZolin (ANCEF) IVPB 2 g/50 mL premix     2 g 100 mL/hr over 30 Minutes Intravenous To [facility] 02/08/15 1418 02/09/15 1425      Best Practice/Protocols:  VTE Prophylaxis: Mechanical GI Prophylaxis: Proton Pump Inhibitor Continous  Sedation  Consults: Treatment Team:  Trauma Md, MD [name], MD Ivin Poot, MD    Events:  Subjective:    Overnight Issues: Patient had significant issues oxygenating last night, much better this AM.  Now on wean mode  Objective:  Vital signs for last 24 hours: Temp:  [98.1 F (36.7 C)-101.5 F (38.6 C)] 100.9 F (38.3 C) (07/06 0730) Pulse Rate:  [66-91] 73 (07/06 0730) Resp:  [6-28] 20 (07/06 0730) BP: (109-177)/(52-88) 143/52 mmHg (07/06 0730) SpO2:  [94 %-100 %] 100 % (07/06 0740) Arterial Line BP: (92-165)/(55-127) 96/93 mmHg (07/06 0730) FiO2 (%):  [40 %-100 %] 40 % (07/06 0743) Weight:  [120.2 kg (264 lb 15.9 oz)] 120.2 kg (264 lb 15.9 oz) (07/06 0500)  Hemodynamic parameters for last 24 hours:    Intake/Output from previous day: 07/05 0701 - 07/06 0700 In: 4105.4 [I.V.:3223.9; NG/GT:731.5; IV Piggyback:150] Out: 2505 [Urine:2155; Chest Tube:350]  Intake/Output this shift:    Vent settings for last 24 hours: Vent Mode:  [-] CPAP FiO2 (%):  [40 %-100 %] 40 % Set Rate:  [20 bmp] 20 bmp Vt Set:  [520 mL-580 mL] 580 mL PEEP:  [8 cmH20] 8 cmH20 Pressure Support:  [12 cmH20] 12 cmH20 Plateau Pressure:  [22 cmH20-26 cmH20] 22 cmH20  Physical Exam:  General: alert and no respiratory distress Neuro: alert, oriented and nonfocal exam Resp: wheezes bilaterally CVS: regular rate and rhythm, S1, S2 normal, no murmur, click, rub or gallop GI: soft, nontender, BS WNL, no r/g and No bowel movement.  Hypoactive bowel sounds Extremities: Right side is splinted all the way down to the toes.  Results for orders placed or performed during the hospital encounter of 02/08/15 (from the past 24 hour(s))  Glucose, capillary     Status: Abnormal   Collection Time: 02/14/15 11:15 AM  Result Value Ref Range   Glucose-Capillary 147 (H) 65 - 99 mg/dL  I-STAT 3, arterial blood gas (G3+)     Status: Abnormal   Collection Time: 02/14/15  3:32 PM  Result Value Ref  Range   pH, Arterial 7.418 7.350 - 7.450   pCO2 arterial 47.9 (  H) 35.0 - 45.0 mmHg   pO2, Arterial 83.0 80.0 - 100.0 mmHg   Bicarbonate 31.0 (H) 20.0 - 24.0 mEq/L   TCO2 32 0 - 100 mmol/L   O2 Saturation 96.0 %   Acid-Base Excess 6.0 (H) 0.0 - 2.0 mmol/L   Patient temperature 98.1 F    Sample type ARTERIAL   Glucose, capillary     Status: Abnormal   Collection Time: 02/14/15  3:56 PM  Result Value Ref Range   Glucose-Capillary 149 (H) 65 - 99 mg/dL  I-STAT 3, arterial blood gas (G3+)     Status: Abnormal   Collection Time: 02/14/15  5:51 PM  Result Value Ref Range   pH, Arterial 7.409 7.350 - 7.450   pCO2 arterial 47.3 (H) 35.0 - 45.0 mmHg   pO2, Arterial 135.0 (H) 80.0 - 100.0 mmHg   Bicarbonate 29.6 (H) 20.0 - 24.0 mEq/L   TCO2 31 0 - 100 mmol/L   O2 Saturation 99.0 %   Acid-Base Excess 5.0 (H) 0.0 - 2.0 mmol/L   Patient temperature 100.7 F    Sample type ARTERIAL   Glucose, capillary     Status: Abnormal   Collection Time: 02/14/15  7:55 PM  Result Value Ref Range   Glucose-Capillary 147 (H) 65 - 99 mg/dL   Comment 1 Notify RN   Glucose, capillary     Status: Abnormal   Collection Time: 02/15/15 12:21 AM  Result Value Ref Range   Glucose-Capillary 220 (H) 65 - 99 mg/dL   Comment 1 Notify RN   Glucose, capillary     Status: Abnormal   Collection Time: 02/15/15  4:26 AM  Result Value Ref Range   Glucose-Capillary 217 (H) 65 - 99 mg/dL  CBC with Differential/Platelet     Status: Abnormal   Collection Time: 02/15/15  4:50 AM  Result Value Ref Range   WBC 8.9 4.0 - 10.5 K/uL   RBC 2.65 (L) 4.22 - 5.81 MIL/uL   Hemoglobin 7.4 (L) 13.0 - 17.0 g/dL   HCT 23.3 (L) 39.0 - 52.0 %   MCV 87.9 78.0 - 100.0 fL   MCH 27.9 26.0 - 34.0 pg   MCHC 31.8 30.0 - 36.0 g/dL   RDW 14.8 11.5 - 15.5 %   Platelets 132 (L) 150 - 400 K/uL   Neutrophils Relative % 77 43 - 77 %   Neutro Abs 6.9 1.7 - 7.7 K/uL   Lymphocytes Relative 11 (L) 12 - 46 %   Lymphs Abs 1.0 0.7 - 4.0 K/uL    Monocytes Relative 9 3 - 12 %   Monocytes Absolute 0.8 0.1 - 1.0 K/uL   Eosinophils Relative 2 0 - 5 %   Eosinophils Absolute 0.2 0.0 - 0.7 K/uL   Basophils Relative 0 0 - 1 %   Basophils Absolute 0.0 0.0 - 0.1 K/uL  Basic metabolic panel     Status: Abnormal   Collection Time: 02/15/15  4:50 AM  Result Value Ref Range   Sodium 146 (H) 135 - 145 mmol/L   Potassium 3.3 (L) 3.5 - 5.1 mmol/L   Chloride 113 (H) 101 - 111 mmol/L   CO2 29 22 - 32 mmol/L   Glucose, Bld 209 (H) 65 - 99 mg/dL   BUN 27 (H) 6 - 20 mg/dL   Creatinine, Ser 0.99 0.61 - 1.24 mg/dL   Calcium 7.2 (L) 8.9 - 10.3 mg/dL   GFR calc non Af Amer >60 >60 mL/min   GFR calc Af Amer >  60 >60 mL/min   Anion gap 4 (L) 5 - 15     Assessment/Plan:   NEURO  Altered Mental Status:  sedation and awakening and cooperative.   Plan: Wean sedation as tolerated.  No other sedation being given.  PULM  Atelectasis/collapse (focal) Chest Wall Trauma multiple rib fractures, Lung Trauma (right and with contusion of lung) and Pneumothorax (traumatic)   Plan: Still unsure of when thoracic surgery will consider surgery.  He has stabilized significantly.  PTX on the right is improved on -30 pressure.  Will drop to -20  CARDIO  No issues   Plan: CPM  RENAL  Urine output is good, but still very positive on fluids for the entire hospitalization.   Plan: Lasix  GI  No significant abdominal trauma   Plan: Continue tube feedings.  ID  Pneumonia (hospital acquired (not ventilator-associated) reportedly Staph pneumonia, but I have not been able to locate the culutre that is positive.)   Plan: Will continue Kefzol because the patient is doing better.  HEME  Anemia acute blood loss anemia)   Plan: Will given one unit of blood.   ENDO Diabetes Mellitus (Type II)   Plan: Add Levemir to medication regimen  Global Issues  Patient is currently weaning very well, but I am reluctant to extubate the patient until we have a firm plan about potential  thoracic stabilization.    LOS: 6 days   Additional comments:I reviewed the patient's new clinical lab test results. cbc/bmet and I reviewed the patients new imaging test results. cxr  Critical Care Total Time*: 30 Minutes  Fern Canova 02/15/2015  *Care during the described time interval was provided by me and/or other providers on the critical care team.  I have reviewed this patient's available data, including medical history, events of note, physical examination and test results as part of my evaluation.

## 2015-02-15 NOTE — Progress Notes (Signed)
Physical Therapy Treatment Patient Details Name: Jeremy Burgess MRN: [id number] DOB: [birth year] Today's Date: 02/15/2015    History of Present Illness Pt admitted after large tree limb fell on him.  He sustained Rt displaced rib fractures 2-9, moderate Rt pneumothorax/pleural effusion - pt orally intubated, Rt CT placed; Rt femur  fracture s/p IMN ( knee and hip ROM as tolerated); Rt ankle/pilon  fracture s/p ex fix with plan for surgery 02/13/14 NWB Rt LE; Rt ulna fracture s/p ORIF WBAT through elbow, ? small liver laceration; hematoma quad/adductor muscles.  PMH includes: IDDM, HTN s     PT Comments    Progressing slowly.  Able to sit EOB for ~ 30 secs, but limited by endotracheal tube discomfort and agitation/secretion management  Follow Up Recommendations  CIR     Equipment Recommendations  Other (comment)    Recommendations for Other Services Rehab consult     Precautions / Restrictions Precautions Precautions: Other (comment) Precaution Comments: Rt rib fractures, CTs, orally intubated  Restrictions Weight Bearing Restrictions: Yes RUE Weight Bearing: Weight bearing as tolerated RLE Weight Bearing: Non weight bearing    Mobility  Bed Mobility Overal bed mobility: Needs Assistance Bed Mobility: Supine to Sit;Sit to Supine     Supine to sit: Mod assist;+2 for physical assistance Sit to supine: Max assist;+2 for physical assistance   General bed mobility comments: Pt able to assist with moving LEs off EOB and with lifiting trunk today   Transfers                 General transfer comment: unable   Ambulation/Gait                 Stairs            Wheelchair Mobility    Modified Rankin (Stroke Patients Only)       Balance Overall balance assessment: Needs assistance Sitting-balance support: Feet supported Sitting balance-Leahy Scale: Poor Sitting balance - Comments: required min - mod A for sitting EOB x ~30 seconds before pt became very  anxiuos and returned to supine                             Cognition Arousal/Alertness: Awake/alert Behavior During Therapy: Anxious Overall Cognitive Status: Difficult to assess                      Exercises Other Exercises Other Exercises: General A/AAROM to bil LE's    General Comments        Pertinent Vitals/Pain Pain Assessment: No/denies pain    Home Living                      Prior Function            PT Goals (current goals can now be found in the care plan section) Acute Rehab PT Goals Patient Stated Goal: pt unable/vented PT Goal Formulation: With patient/family Time For Goal Achievement: 02/27/15 Potential to Achieve Goals: Good Progress towards PT goals: Progressing toward goals    Frequency  Min 3X/week    PT Plan Current plan remains appropriate    Co-evaluation PT/OT/SLP Co-Evaluation/Treatment: Yes Reason for Co-Treatment: Complexity of the patient's impairments (multi-system involvement) PT goals addressed during session: Mobility/safety with mobility       End of Session Equipment Utilized During Treatment: Oxygen Activity Tolerance: Other (comment) (limited by endotracheal discomfort and secretion management) Patient left: in  bed;with call bell/phone within reach     Time: 1249-1321 PT Time Calculation (min) (ACUTE ONLY): 32 min  Charges:  $Therapeutic Activity: 8-22 mins                    G Codes:      Kailen Name, Tessie Fass 02/15/2015, 3:25 PM 02/15/2015  Donnella Sham, [place] [phone]  (pager)

## 2015-02-15 NOTE — Progress Notes (Signed)
     Subjective:  POD#1 ORIF R ankle.  POD# 5 R IM nail of the femur and ORIF R ulna. Patient reports pain as moderate.  Resting comfortably in bed.  Intubated.  Patient refused epidural catheter yesterday to help with better pain control.   Objective:   VITALS:   Filed Vitals:   02/15/15 0321 02/15/15 0400 02/15/15 0500 02/15/15 0600  BP:  141/58 128/55 122/52  Pulse: 79 76 72 67  Temp:  101.3 F (38.5 C) 101.3 F (38.5 C) 101.1 F (38.4 C)  TempSrc:  Core (Comment) Core (Comment)   Resp: 20 20 20 20   Height:      Weight:   120.2 kg (264 lb 15.9 oz)   SpO2: 100% 100% 100% 100%   Intubated  ABD soft Neurovascular intact Sensation intact distally Intact pulses distally Incision: dressing C/D/I  R leg splinted  Lab Results  Component Value Date   WBC 8.9 02/15/2015   HGB 7.4* 02/15/2015   HCT 23.3* 02/15/2015   MCV 87.9 02/15/2015   PLT 132* 02/15/2015   BMET    Component Value Date/Time   NA 146* 02/15/2015 0450   K 3.3* 02/15/2015 0450   CL 113* 02/15/2015 0450   CO2 29 02/15/2015 0450   GLUCOSE 209* 02/15/2015 0450   BUN 27* 02/15/2015 0450   CREATININE 0.99 02/15/2015 0450   CALCIUM 7.2* 02/15/2015 0450   GFRNONAA >60 02/15/2015 0450   GFRAA >60 02/15/2015 0450     Assessment/Plan: 1 Day Post-Op   Active Problems:   Accidentally struck by falling tree   Insulin dependent diabetes mellitus   Traumatic closed displaced fracture of multiple ribs of right side   Closed fracture of right ulna   Femur fracture, right   Ankle fracture, right   Acute blood loss anemia   Thrombocytopenia   Acute renal failure   Acute respiratory failure   Traumatic hemopneumothorax   Up with therapy NWB in the RLE but WBAT in the R elbow in order to use a platform walker DVT prophylaxis per trauma/critical care team   Jeremy Burgess [name] 02/15/2015, 6:44 AM Cell [phone]

## 2015-02-15 NOTE — Progress Notes (Signed)
Occupational Therapy Treatment Patient Details Name: Jeremy Burgess MRN: [id number] DOB: Dec 09, 1965 Today's Date: 02/15/2015    History of present illness Pt admitted after large tree limb fell on him.  He sustained Rt displaced rib fractures 2-9, moderate Rt pneumothorax/pleural effusion - pt orally intubated, Rt CT placed; Rt femur  fracture s/p IMN ( knee and hip ROM as tolerated); Rt ankle/pilon  fracture s/p ex fix with plan for surgery 02/13/14 NWB Rt LE; Rt ulna fracture s/p ORIF WBAT through elbow, ? small liver laceration; hematoma quad/adductor muscles.  PMH includes: IDDM, HTN s    OT comments  Pt tolerated moving to EOB and sat x ~30 seconds before becoming anxious and returning to supine.  VSS throughout.  Pt denied pain.    Follow Up Recommendations  CIR;Supervision/Assistance - 24 hour    Equipment Recommendations  None recommended by OT    Recommendations for Other Services      Precautions / Restrictions Precautions Precautions: Other (comment) Precaution Comments: Rt rib fractures, CTs, orally intubated  Restrictions Weight Bearing Restrictions: Yes RUE Weight Bearing: Weight bearing as tolerated RLE Weight Bearing: Non weight bearing       Mobility Bed Mobility Overal bed mobility: Needs Assistance Bed Mobility: Supine to Sit;Sit to Supine     Supine to sit: Mod assist;+2 for physical assistance Sit to supine: Max assist;+2 for physical assistance   General bed mobility comments: Pt able to assist with moving LEs off EOB and with lifiting trunk today   Transfers                 General transfer comment: unable     Balance Overall balance assessment: Needs assistance Sitting-balance support: Feet supported Sitting balance-Leahy Scale: Poor Sitting balance - Comments: required min - mod A for sitting EOB x ~30 seconds before pt became very anxiuos and returned to supine                            ADL                                                Vision                     Perception     Praxis      Cognition   Behavior During Therapy: Anxious Overall Cognitive Status: Difficult to assess                       Extremity/Trunk Assessment               Exercises     Shoulder Instructions       General Comments      Pertinent Vitals/ Pain       Pain Assessment: No/denies pain  Home Living                                          Prior Functioning/Environment              Frequency Min 3X/week     Progress Toward Goals  OT Goals(current goals can now be found in the care plan section)  Progress towards OT goals:  Progressing toward goals  ADL Goals Pt Will Perform Grooming: with min assist;sitting Pt Will Perform Upper Body Bathing: with min assist;sitting Pt Will Perform Lower Body Bathing: with mod assist;sit to/from stand;with adaptive equipment Pt Will Perform Upper Body Dressing: with min assist;sitting Pt Will Perform Lower Body Dressing: with mod assist;with adaptive equipment;sit to/from stand Pt Will Transfer to Toilet: with mod assist;stand pivot transfer;bedside commode Pt Will Perform Toileting - Clothing Manipulation and hygiene: with mod assist;sit to/from stand  Plan Discharge plan remains appropriate    Co-evaluation                 End of Session Equipment Utilized During Treatment: Other (comment);Oxygen (rt UE splint)   Activity Tolerance Other (comment) (anxiety with EOB sitting )   Patient Left in bed;with call bell/phone within reach;with nursing/sitter in room   Nurse Communication Mobility status        Time: 5379-4327 OT Time Calculation (min): 32 min  Charges: OT General Charges $OT Visit: 1 Procedure OT Treatments $Therapeutic Activity: 8-22 mins  Esme Freund M 02/15/2015, 1:47 PM

## 2015-02-15 NOTE — Progress Notes (Addendum)
1 Day Post-Op Procedure(s) (LRB): OPEN REDUCTION INTERNAL FIXATION (ORIF) PILON FRACTURE (Right) REMOVAL EXTERNAL FIXATION LEG (Right) Subjective: Post traumatic right sided rib fractures  Patient examined most recent chest x-ray reviewed He has completed his orthopedic procedures to his right upper and lower extremities He is still ventilator dependent with coarse breath sounds, fever and secretions. He denies any tenderness over the right lateral thorax were he has rib fractures. With persistent fever, bilateral coarse breath sounds and tracheal aspirate positive for staph I would not recommend plating his rib fractures -the  extended surgery would probably prolong his ventilator dependence. Consider tracheostomy.  Objective: Vital signs in last 24 hours: Temp:  [99 F (37.2 C)-101.5 F (38.6 C)] 100.9 F (38.3 C) (07/06 1140) Pulse Rate:  [67-91] 72 (07/06 1140) Cardiac Rhythm:  [-] Normal sinus rhythm (07/06 0800) Resp:  [6-28] 19 (07/06 1140) BP: (122-177)/(51-88) 137/54 mmHg (07/06 1140) SpO2:  [94 %-100 %] 100 % (07/06 1140) Arterial Line BP: (92-165)/(55-127) 94/88 mmHg (07/06 0800) FiO2 (%):  [40 %-100 %] 40 % (07/06 1125) Weight:  [264 lb 15.9 oz (120.2 kg)] 264 lb 15.9 oz (120.2 kg) (07/06 0500)  Hemodynamic parameters for last 24 hours:  febrile, off Crestor is  Intake/Output from previous day: 07/05 0701 - 07/06 0700 In: 4105.4 [I.V.:3223.9; NG/GT:731.5; IV Piggyback:150] Out: 2505 [Urine:2155; Chest Tube:350] Intake/Output this shift: Total I/O In: 845 [I.V.:555; Blood:30; NG/GT:260] Out: 1425 [Urine:1425]  Bilateral coarse breath sounds No air leak from chest tube with serous drainage  Lab Results:  Recent Labs  02/14/15 0530 02/15/15 0450  WBC 11.7* 8.9  HGB 9.4* 7.4*  HCT 28.5* 23.3*  PLT 150 132*   BMET:  Recent Labs  02/14/15 0530 02/15/15 0450  NA 144 146*  K 3.8 3.3*  CL 111 113*  CO2 27 29  GLUCOSE 187* 209*  BUN 31* 27*   CREATININE 0.99 0.99  CALCIUM 8.0* 7.2*    PT/INR: No results for input(s): LABPROT, INR in the last 72 hours. ABG    Component Value Date/Time   PHART 7.387 02/15/2015 0858   HCO3 28.3* 02/15/2015 0858   TCO2 30 02/15/2015 0858   ACIDBASEDEF 4.0* 02/08/2015 1435   O2SAT 97.0 02/15/2015 0858   CBG (last 3)   Recent Labs  02/15/15 0021 02/15/15 0426 02/15/15 0734  GLUCAP 220* 217* 185*    Assessment/Plan: S/P Procedure(s) (LRB): OPEN REDUCTION INTERNAL FIXATION (ORIF) PILON FRACTURE (Right) REMOVAL EXTERNAL FIXATION LEG (Right) Right side rib fractures-do not see benefit from rib plating at this time   LOS: 6 days    Jeremy Burgess 02/15/2015

## 2015-02-15 NOTE — Progress Notes (Signed)
Inpatient Diabetes Program Recommendations  AACE/ADA: New Consensus Statement on Inpatient Glycemic Control (2013)  Target Ranges:  Prepandial:   less than 140 mg/dL      Peak postprandial:   less than 180 mg/dL (1-2 hours)      Critically ill patients:  140 - 180 mg/dL   Results for DACIAN, ORRICO (MRN [id number]) as of 02/15/2015 07:51  Ref. Range 02/14/2015 11:15 02/14/2015 15:56 02/14/2015 19:55 02/15/2015 00:21 02/15/2015 04:26  Glucose-Capillary Latest Ref Range: 65-99 mg/dL 147 (H) 149 (H) 147 (H) 220 (H) 217 (H)   Diabetes history: DM 2 Outpatient Diabetes medications: Levemir 10 units, Janumet 50-1000mg  BID Current orders for Inpatient glycemic control: Levemir 8 units, Novolog Resistant Q4hrs  Inpatient Diabetes Program Recommendations Insulin - Tube Feed Coverage: Tube feeds increased from 40 to 60 ml/hr overnight. Glucose increased into the 200's. Please consider adding Novolog 4 units of Tube Feed Coverage Q4hrs.  Thanks,  Tama Headings RN, MSN, [facility] Inpatient Diabetes Coordinator Team Pager [phone]

## 2015-02-15 NOTE — Progress Notes (Signed)
Nurse called on-call Chaplain for prayer for the Pt. Nurse said someone had been praying with Pt regularly. Chaplain prayed with Pt. Pt was resting but was able to respond to questions.    02/15/15 1800  Clinical Encounter Type  Visited With Patient  Visit Type Spiritual support  Referral From Nurse  Spiritual Encounters  Spiritual Needs Prayer

## 2015-02-16 ENCOUNTER — Encounter (HOSPITAL_COMMUNITY): Payer: Self-pay | Admitting: Orthopedic Surgery

## 2015-02-16 ENCOUNTER — Inpatient Hospital Stay (HOSPITAL_COMMUNITY): Payer: Self-pay

## 2015-02-16 LAB — CBC WITH DIFFERENTIAL/PLATELET
Basophils Absolute: 0 10*3/uL (ref 0.0–0.1)
Basophils Relative: 0 % (ref 0–1)
Eosinophils Absolute: 0.3 10*3/uL (ref 0.0–0.7)
Eosinophils Relative: 3 % (ref 0–5)
HEMATOCRIT: 25.4 % — AB (ref 39.0–52.0)
HEMOGLOBIN: 8.2 g/dL — AB (ref 13.0–17.0)
LYMPHS PCT: 11 % — AB (ref 12–46)
Lymphs Abs: 1.1 10*3/uL (ref 0.7–4.0)
MCH: 28 pg (ref 26.0–34.0)
MCHC: 32.3 g/dL (ref 30.0–36.0)
MCV: 86.7 fL (ref 78.0–100.0)
MONO ABS: 0.8 10*3/uL (ref 0.1–1.0)
MONOS PCT: 8 % (ref 3–12)
NEUTROS ABS: 8.2 10*3/uL — AB (ref 1.7–7.7)
Neutrophils Relative %: 78 % — ABNORMAL HIGH (ref 43–77)
Platelets: 179 10*3/uL (ref 150–400)
RBC: 2.93 MIL/uL — ABNORMAL LOW (ref 4.22–5.81)
RDW: 15.3 % (ref 11.5–15.5)
WBC: 10.4 10*3/uL (ref 4.0–10.5)

## 2015-02-16 LAB — GLUCOSE, CAPILLARY
GLUCOSE-CAPILLARY: 219 mg/dL — AB (ref 65–99)
Glucose-Capillary: 110 mg/dL — ABNORMAL HIGH (ref 65–99)
Glucose-Capillary: 117 mg/dL — ABNORMAL HIGH (ref 65–99)
Glucose-Capillary: 140 mg/dL — ABNORMAL HIGH (ref 65–99)
Glucose-Capillary: 192 mg/dL — ABNORMAL HIGH (ref 65–99)
Glucose-Capillary: 215 mg/dL — ABNORMAL HIGH (ref 65–99)
Glucose-Capillary: 253 mg/dL — ABNORMAL HIGH (ref 65–99)

## 2015-02-16 LAB — TYPE AND SCREEN
ABO/RH(D): A POS
ANTIBODY SCREEN: NEGATIVE
Unit division: 0

## 2015-02-16 LAB — BASIC METABOLIC PANEL
Anion gap: 6 (ref 5–15)
BUN: 32 mg/dL — ABNORMAL HIGH (ref 6–20)
CO2: 31 mmol/L (ref 22–32)
Calcium: 7.6 mg/dL — ABNORMAL LOW (ref 8.9–10.3)
Chloride: 107 mmol/L (ref 101–111)
Creatinine, Ser: 0.95 mg/dL (ref 0.61–1.24)
GFR calc non Af Amer: >60 mL/min (ref >60)
GLUCOSE: 258 mg/dL — AB (ref 65–99)
POTASSIUM: 3.2 mmol/L — AB (ref 3.5–5.1)
SODIUM: 144 mmol/L (ref 135–145)

## 2015-02-16 MED ORDER — POTASSIUM CHLORIDE 10 MEQ/50ML IV SOLN
10.0000 meq | INTRAVENOUS | Status: AC
  Administered 2015-02-16 (×4): 10 meq via INTRAVENOUS
  Filled 2015-02-16 (×4): qty 50

## 2015-02-16 MED ORDER — FENTANYL CITRATE (PF) 100 MCG/2ML IJ SOLN
50.0000 ug | INTRAMUSCULAR | Status: DC | PRN
  Administered 2015-02-16 (×4): 100 ug via INTRAVENOUS
  Filled 2015-02-16 (×4): qty 2

## 2015-02-16 MED ORDER — CETYLPYRIDINIUM CHLORIDE 0.05 % MT LIQD
7.0000 mL | Freq: Two times a day (BID) | OROMUCOSAL | Status: DC
  Administered 2015-02-16 – 2015-02-22 (×9): 7 mL via OROMUCOSAL

## 2015-02-16 MED ORDER — IPRATROPIUM-ALBUTEROL 0.5-2.5 (3) MG/3ML IN SOLN
3.0000 mL | Freq: Three times a day (TID) | RESPIRATORY_TRACT | Status: DC
  Administered 2015-02-17 – 2015-02-20 (×10): 3 mL via RESPIRATORY_TRACT
  Filled 2015-02-16 (×10): qty 3

## 2015-02-16 NOTE — Addendum Note (Signed)
Addendum  created 02/16/15 7703 by [name], MD   Modules edited: Notes Section   Notes Section:  File: [id number]; Pend: [id number]; Raelyn Number: [id number]; Pend: [id number]

## 2015-02-16 NOTE — Progress Notes (Signed)
Follow up - Trauma and Critical Care  Patient Details:    Jeremy Burgess is an 49 y.o. male.  Lines/tubes : Airway 7.5 mm (Active)  Secured at (cm) 23 cm 02/16/2015  2:49 AM  Measured From Lips 02/16/2015  2:49 AM  Secured Location Right 02/16/2015  2:49 AM  Secured By [employer] 02/16/2015  2:49 AM  Tube Holder Repositioned Yes 02/16/2015  2:49 AM  Cuff Pressure (cm H2O) 30 cm H2O 02/16/2015  2:49 AM  Site Condition Cool;Dry 02/16/2015  2:49 AM     PICC Triple Lumen 38/10/17 PICC Left Basilic 42 cm 0 cm (Active)  Indication for Insertion or Continuance of Line Prolonged intravenous therapies 02/15/2015 10:00 PM  Exposed Catheter (cm) 0 cm 02/10/2015  6:52 PM  Site Assessment Clean;Dry;Intact 02/15/2015 10:00 PM  Lumen #1 Status Infusing;Flushed;Blood return noted 02/15/2015 10:00 PM  Lumen #2 Status Infusing;Flushed;Blood return noted 02/15/2015 10:00 PM  Lumen #3 Status Flushed;Capped (Central line);Blood return noted 02/15/2015 10:00 PM  Dressing Type Transparent 02/15/2015 10:00 PM  Dressing Status Clean;Dry;Intact;Antimicrobial disc in place 02/15/2015 10:00 PM  Line Care Connections checked and tightened 02/15/2015 10:00 PM  Dressing Change Due 02/17/15 02/15/2015  8:00 AM     Chest Tube 2 Pleural 32 Fr. (Active)  Suction -20 cm H2O 02/15/2015  8:00 PM  Chest Tube Air Leak None 02/15/2015  8:00 PM  Patency Intervention Tip/tilt 02/15/2015  8:00 PM  Drainage Description Serosanguineous 02/15/2015  8:00 PM  Dressing Status Clean;Dry;Intact 02/15/2015  8:00 PM  Dressing Intervention Dressing changed 02/14/2015 11:00 AM  Site Assessment Clean;Dry;Intact 02/15/2015  8:00 PM  Surrounding Skin Unable to view 02/15/2015  8:00 PM  Output (mL) 110 mL 02/16/2015  5:00 AM     NG/OG Tube Orogastric Center mouth (Active)  Placement Verification Auscultation 02/16/2015  4:00 AM  Site Assessment Clean;Intact 02/16/2015  4:00 AM  Status Infusing tube feed 02/16/2015  4:00 AM  Drainage Appearance Tan 02/15/2015  8:00 PM  Gastric  Residual 0 mL 02/16/2015  4:00 AM  Intake (mL) 80 mL 02/14/2015 10:00 PM  Output (mL) 125 mL 02/10/2015  6:00 PM     Urethral Catheter [name] Straight-tip;Temperature probe 16 Fr. (Active)  Indication for Insertion or Continuance of Catheter Peri-operative use for selective surgical procedure 02/15/2015  8:00 PM  Site Assessment Clean;Intact;Dry 02/15/2015  8:00 PM  Catheter Maintenance Bag below level of bladder;Catheter secured;Drainage bag/tubing not touching floor;Insertion date on drainage bag;No dependent loops;Seal intact 02/15/2015  8:00 PM  Collection Container Standard drainage bag 02/15/2015  8:00 PM  Securement Method Securing device (Describe) 02/15/2015  8:00 PM  Urinary Catheter Interventions Unclamped 02/15/2015  8:00 AM  Output (mL) 225 mL 02/16/2015  6:00 AM    Microbiology/Sepsis markers: Results for orders placed or performed during the hospital encounter of 02/08/15  MRSA PCR Screening     Status: None   Collection Time: 02/08/15  3:37 PM  Result Value Ref Range Status   MRSA by PCR NEGATIVE NEGATIVE Final    Comment:        The GeneXpert MRSA Assay (FDA approved for NASAL specimens only), is one component of a comprehensive MRSA colonization surveillance program. It is not intended to diagnose MRSA infection nor to guide or monitor treatment for MRSA infections.   Culture, respiratory (NON-Expectorated)     Status: None   Collection Time: 02/09/15  7:40 AM  Result Value Ref Range Status   Specimen Description TRACHEAL ASPIRATE  Final   Special  Requests NONE  Final   Gram Stain   Final    FEW WBC PRESENT, PREDOMINANTLY MONONUCLEAR RARE SQUAMOUS EPITHELIAL CELLS PRESENT FEW GRAM POSITIVE COCCI IN PAIRS IN CLUSTERS Performed at [facility]    Culture   Final    MODERATE STAPHYLOCOCCUS AUREUS Note: RIFAMPIN AND GENTAMICIN SHOULD NOT BE USED AS SINGLE DRUGS FOR TREATMENT OF STAPH INFECTIONS. Performed at [facility]    Report Status 02/12/2015 FINAL   Final   Organism ID, Bacteria STAPHYLOCOCCUS AUREUS  Final      Susceptibility   Staphylococcus aureus - MIC*    CLINDAMYCIN <=0.25 SENSITIVE Sensitive     ERYTHROMYCIN <=0.25 SENSITIVE Sensitive     GENTAMICIN <=0.5 SENSITIVE Sensitive     LEVOFLOXACIN 0.25 SENSITIVE Sensitive     OXACILLIN 0.5 SENSITIVE Sensitive     PENICILLIN >=0.5 RESISTANT Resistant     RIFAMPIN <=0.5 SENSITIVE Sensitive     TRIMETH/SULFA <=10 SENSITIVE Sensitive     VANCOMYCIN <=0.5 SENSITIVE Sensitive     TETRACYCLINE <=1 SENSITIVE Sensitive     MOXIFLOXACIN <=0.25 SENSITIVE Sensitive     * MODERATE STAPHYLOCOCCUS AUREUS  Culture, Urine     Status: None   Collection Time: 02/11/15 10:00 AM  Result Value Ref Range Status   Specimen Description URINE, CATHETERIZED  Final   Special Requests NONE  Final   Culture NO GROWTH 1 DAY  Final   Report Status 02/12/2015 FINAL  Final  Culture, blood (routine x 2)     Status: None (Preliminary result)   Collection Time: 02/12/15 10:30 AM  Result Value Ref Range Status   Specimen Description BLOOD RIGHT ANTECUBITAL  Final   Special Requests BOTTLES DRAWN AEROBIC AND ANAEROBIC 10CC  Final   Culture NO GROWTH 3 DAYS  Final   Report Status PENDING  Incomplete  Culture, blood (routine x 2)     Status: None (Preliminary result)   Collection Time: 02/12/15 10:45 AM  Result Value Ref Range Status   Specimen Description BLOOD RIGHT HAND  Final   Special Requests BOTTLES DRAWN AEROBIC ONLY 3CC  Final   Culture NO GROWTH 3 DAYS  Final   Report Status PENDING  Incomplete  Culture, respiratory (NON-Expectorated)     Status: None   Collection Time: 02/12/15 12:06 PM  Result Value Ref Range Status   Specimen Description TRACHEAL ASPIRATE  Final   Special Requests Normal  Final   Gram Stain   Final    FEW WBC PRESENT, PREDOMINANTLY PMN NO SQUAMOUS EPITHELIAL CELLS SEEN NO ORGANISMS SEEN Performed at [facility]    Culture   Final    Non-Pathogenic  Oropharyngeal-type Flora Isolated. Performed at [facility]    Report Status 02/14/2015 FINAL  Final    Anti-infectives:  Anti-infectives    Start     Dose/Rate Route Frequency Ordered Stop   02/14/15 1400  ceFAZolin (ANCEF) IVPB 2 g/50 mL premix     2 g 100 mL/hr over 30 Minutes Intravenous 3 times per day 02/14/15 1105     02/14/15 0700  ceFAZolin (ANCEF) IVPB 2 g/50 mL premix     2 g 100 mL/hr over 30 Minutes Intravenous On call to O.R. 02/13/15 1326 02/14/15 0805   02/14/15 0600  ceFAZolin (ANCEF) IVPB 2 g/50 mL premix  Status:  Discontinued     2 g 100 mL/hr over 30 Minutes Intravenous On call to O.R. 02/13/15 1324 02/13/15 1326   02/12/15 1400  piperacillin-tazobactam (ZOSYN) IVPB  3.375 g  Status:  Discontinued     3.375 g 12.5 mL/hr over 240 Minutes Intravenous Every 8 hours 02/12/15 0854 02/14/15 1105   02/12/15 1045  vancomycin (VANCOCIN) 1,250 mg in sodium chloride 0.9 % 250 mL IVPB  Status:  Discontinued     1,250 mg 166.7 mL/hr over 90 Minutes Intravenous Every 12 hours 02/12/15 1040 02/14/15 1105   02/09/15 2030  ceFAZolin (ANCEF) IVPB 2 g/50 mL premix     2 g 100 mL/hr over 30 Minutes Intravenous Every 6 hours 02/09/15 1841 02/10/15 0910   02/09/15 1230  ceFAZolin (ANCEF) IVPB 2 g/50 mL premix     2 g 100 mL/hr over 30 Minutes Intravenous To [facility] 02/08/15 1418 02/09/15 1425      Best Practice/Protocols:  VTE Prophylaxis: Lovenox (prophylaxtic dose) GI Prophylaxis: Proton Pump Inhibitor Continous Sedation  Consults: Treatment Team:  Trauma Md, MD [name], MD Ivin Poot, MD    Events:  Subjective:    Overnight Issues: Patient is awake and alert.  May be able to extubate later today,  Objective:  Vital signs for last 24 hours: Temp:  [100 F (37.8 C)-101.3 F (38.5 C)] 100.2 F (37.9 C) (07/07 0700) Pulse Rate:  [63-98] 72 (07/07 0700) Resp:  [0-26] 18 (07/07 0700) BP: (124-167)/(48-92) 154/62 mmHg (07/07  0700) SpO2:  [98 %-100 %] 100 % (07/07 0700) Arterial Line BP: (94)/(88) 94/88 mmHg (07/06 0800) FiO2 (%):  [40 %] 40 % (07/07 0400) Weight:  [116.8 kg (257 lb 8 oz)] 116.8 kg (257 lb 8 oz) (07/07 0447)  Hemodynamic parameters for last 24 hours:    Intake/Output from previous day: 07/06 0701 - 07/07 0700 In: 4587.7 [I.V.:2247.7; Blood:330; NG/GT:1860; IV Piggyback:150] Out: 3800 [Urine:3540; Chest Tube:260]  Intake/Output this shift:    Vent settings for last 24 hours: Vent Mode:  [-] PRVC FiO2 (%):  [40 %] 40 % Set Rate:  [20 bmp] 20 bmp Vt Set:  [580 mL] 580 mL PEEP:  [8 cmH20] 8 cmH20 Pressure Support:  [12 cmH20] 12 cmH20 Plateau Pressure:  [21 cmH20-22 cmH20] 21 cmH20  Physical Exam:  General: alert and no respiratory distress Neuro: alert, oriented and nonfocal exam Resp: clear to auscultation bilaterally and wheezing has resolved. CVS: regular rate and rhythm, S1, S2 normal, no murmur, click, rub or gallop GI: soft, nontender, BS WNL, no r/g and tolerating tube feedings well. Extremities: no edema, no erythema, pulses WNL and Right side splinted  Results for orders placed or performed during the hospital encounter of 02/08/15 (from the past 24 hour(s))  Prepare RBC     Status: None   Collection Time: 02/15/15  8:55 AM  Result Value Ref Range   Order Confirmation ORDER PROCESSED BY BLOOD BANK   Type and screen     Status: None (Preliminary result)   Collection Time: 02/15/15  8:55 AM  Result Value Ref Range   ABO/RH(D) A POS    Antibody Screen NEG    Sample Expiration 02/18/2015    Unit Number [id number]    Blood Component Type RBC LR PHER1    Unit division 00    Status of Unit ISSUED    Transfusion Status OK TO TRANSFUSE    Crossmatch Result Compatible   I-STAT 3, arterial blood gas (G3+)     Status: Abnormal   Collection Time: 02/15/15  8:58 AM  Result Value Ref Range   pH, Arterial 7.387 7.350 - 7.450   pCO2 arterial 47.6 (  H) 35.0 - 45.0 mmHg   pO2,  Arterial 95.0 80.0 - 100.0 mmHg   Bicarbonate 28.3 (H) 20.0 - 24.0 mEq/L   TCO2 30 0 - 100 mmol/L   O2 Saturation 97.0 %   Acid-Base Excess 3.0 (H) 0.0 - 2.0 mmol/L   Patient temperature 38.3 C    Collection site RADIAL, ALLEN'S TEST ACCEPTABLE    Drawn by Operator    Sample type ARTERIAL   Glucose, capillary     Status: Abnormal   Collection Time: 02/15/15 11:23 AM  Result Value Ref Range   Glucose-Capillary 219 (H) 65 - 99 mg/dL  Glucose, capillary     Status: Abnormal   Collection Time: 02/15/15  3:18 PM  Result Value Ref Range   Glucose-Capillary 155 (H) 65 - 99 mg/dL  Glucose, capillary     Status: Abnormal   Collection Time: 02/15/15  7:45 PM  Result Value Ref Range   Glucose-Capillary 186 (H) 65 - 99 mg/dL  Glucose, capillary     Status: Abnormal   Collection Time: 02/16/15 12:34 AM  Result Value Ref Range   Glucose-Capillary 192 (H) 65 - 99 mg/dL  CBC with Differential/Platelet     Status: Abnormal   Collection Time: 02/16/15  4:19 AM  Result Value Ref Range   WBC 10.4 4.0 - 10.5 K/uL   RBC 2.93 (L) 4.22 - 5.81 MIL/uL   Hemoglobin 8.2 (L) 13.0 - 17.0 g/dL   HCT 25.4 (L) 39.0 - 52.0 %   MCV 86.7 78.0 - 100.0 fL   MCH 28.0 26.0 - 34.0 pg   MCHC 32.3 30.0 - 36.0 g/dL   RDW 15.3 11.5 - 15.5 %   Platelets 179 150 - 400 K/uL   Neutrophils Relative % 78 (H) 43 - 77 %   Neutro Abs 8.2 (H) 1.7 - 7.7 K/uL   Lymphocytes Relative 11 (L) 12 - 46 %   Lymphs Abs 1.1 0.7 - 4.0 K/uL   Monocytes Relative 8 3 - 12 %   Monocytes Absolute 0.8 0.1 - 1.0 K/uL   Eosinophils Relative 3 0 - 5 %   Eosinophils Absolute 0.3 0.0 - 0.7 K/uL   Basophils Relative 0 0 - 1 %   Basophils Absolute 0.0 0.0 - 0.1 K/uL  Basic metabolic panel     Status: Abnormal   Collection Time: 02/16/15  4:19 AM  Result Value Ref Range   Sodium 144 135 - 145 mmol/L   Potassium 3.2 (L) 3.5 - 5.1 mmol/L   Chloride 107 101 - 111 mmol/L   CO2 31 22 - 32 mmol/L   Glucose, Bld 258 (H) 65 - 99 mg/dL   BUN 32 (H) 6  - 20 mg/dL   Creatinine, Ser 0.95 0.61 - 1.24 mg/dL   Calcium 7.6 (L) 8.9 - 10.3 mg/dL   GFR calc non Af Amer >60 >60 mL/min   GFR calc Af Amer >60 >60 mL/min   Anion gap 6 5 - 15  Glucose, capillary     Status: Abnormal   Collection Time: 02/16/15  4:40 AM  Result Value Ref Range   Glucose-Capillary 215 (H) 65 - 99 mg/dL     Assessment/Plan:   NEURO  Altered Mental Status:  awake and alert   Plan: CPM  PULM  Atelectasis/collapse (focal and improved.)   Plan: Should be able to extubate today.  CARDIO  No specific issues   Plan: CPM  RENAL  Urine output is good   Plan: CPM  GI  No specific issues.   Plan: CPM  ID  Pneumonia (hospital acquired (not ventilator-associated) Staph pneumonia)   Plan: CPM  HEME  Anemia acute blood loss anemia and anemia of critical illness)   Plan: No more blood today.  ENDO Diabetes Mellitus (Type I)   Plan: CPM  Global Issues  Patient dong well enough to give trial of extubation.  If he fails and needs to be reintubated will get trach next week.    LOS: 7 days   Additional comments:I reviewed the patient's new clinical lab test results. cbc/bmet and I reviewed the patients new imaging test results. cxr  Critical Care Total Time*: 30 Minutes  Beva Remund 02/16/2015  *Care during the described time interval was provided by me and/or other providers on the critical care team.  I have reviewed this patient's available data, including medical history, events of note, physical examination and test results as part of my evaluation.
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
## 2015-02-16 NOTE — Progress Notes (Signed)
Received call from [place], [place] that pt's brother has questions about applying for disability and Medicaid for pt.  Spoke with Aileen Pilot in Financial Counseling: she states she has spoken with both pt's mother and brother Charlotte Crumb.  She has advised brother to apply for Medicaid for pt in [place], and a disability referral has been made to [employer] here in [place].    Caryl Pina states she has spoken with the pt's mother twice, and that she has her phone # if she has questions.    Will continue to follow.    Reinaldo Raddle, RN, BSN  Trauma/Neuro ICU Case Manager [phone]

## 2015-02-16 NOTE — Procedures (Signed)
Extubation Procedure Note  Patient Details:   Name: Jeremy Burgess DOB: 03-Oct-1965 MRN: [id number]   Airway Documentation:  Airway 7.5 mm (Active)  Secured at (cm) 23 cm 02/16/2015  8:29 AM  Measured From Lips 02/16/2015  8:29 AM  Secured Location Center 02/16/2015  8:29 AM  Secured By [employer] 02/16/2015  8:29 AM  Tube Holder Repositioned Yes 02/16/2015  8:29 AM  Cuff Pressure (cm H2O) 30 cm H2O 02/16/2015  2:49 AM  Site Condition Dry 02/16/2015  8:29 AM    Evaluation  O2 sats: stable throughout Complications: none Patient did tolerate procedure well. Bilateral Breath Sounds: Diminished, Clear Suctioning: Airway Yes. Extubated per dr.'s orders.  Pt. Placed on 2L [place].[name]  Jahkai Yandell V 02/16/2015, 11:14 AM

## 2015-02-16 NOTE — Progress Notes (Signed)
Anesthesiology Follow-up:  Awake and alert, now weaning vent. R. chest tube in place with R. sided airspace disease C/W pulmonary contusion. I again presented the option of an epidural catheter for pain control and he declined. He denies severe chest wall pain at present.  [name]

## 2015-02-16 NOTE — Progress Notes (Signed)
Wasted 125 cc of Fentanyl gtt with Gilford Rile, RN.

## 2015-02-16 NOTE — Progress Notes (Signed)
     Subjective:  POD#2 ORIF R ankle and POD#5 R IM nail of the femur and ORIF of R ulna. Patient reports pain as mild to moderate. Intubated but resting comfortably in bed.  PT was able to help patient sit at the EOB yesterday.  Once extubated will be able to tolerate more. Trauma plans to try to wean patient off vent today.    Objective:   VITALS:   Filed Vitals:   02/16/15 0829 02/16/15 0830 02/16/15 0900 02/16/15 0930  BP:  143/54 163/64 142/59  Pulse:  68 80 73  Temp:  100.2 F (37.9 C) 100.2 F (37.9 C) 100.2 F (37.9 C)  TempSrc:      Resp:  14 17 13   Height:      Weight:      SpO2: 100% 100% 100% 100%   Intubated Neurologically intact ABD soft Neurovascular intact Sensation intact distally Intact pulses distally Incision: dressing C/D/I R ankle splinted.  Lab Results  Component Value Date   WBC 10.4 02/16/2015   HGB 8.2* 02/16/2015   HCT 25.4* 02/16/2015   MCV 86.7 02/16/2015   PLT 179 02/16/2015   BMET    Component Value Date/Time   NA 144 02/16/2015 0419   K 3.2* 02/16/2015 0419   CL 107 02/16/2015 0419   CO2 31 02/16/2015 0419   GLUCOSE 258* 02/16/2015 0419   BUN 32* 02/16/2015 0419   CREATININE 0.95 02/16/2015 0419   CALCIUM 7.6* 02/16/2015 0419   GFRNONAA >60 02/16/2015 0419   GFRAA >60 02/16/2015 0419     Assessment/Plan: 2 Days Post-Op   Active Problems:   Accidentally struck by falling tree   Insulin dependent diabetes mellitus   Traumatic closed displaced fracture of multiple ribs of right side   Closed fracture of right ulna   Femur fracture, right   Ankle fracture, right   Acute blood loss anemia   Thrombocytopenia   Acute renal failure   Acute respiratory failure   Traumatic hemopneumothorax   Up with therapy NWB in the RLE, WBAT in the R elbow in order to use a platform walker Lovenox per trauma/critical care for DVT prophylaxis   Leovardo Thoman [name] 02/16/2015, 11:04 AM Cell [phone]

## 2015-02-17 ENCOUNTER — Encounter (HOSPITAL_COMMUNITY): Payer: Self-pay | Admitting: Physical Medicine and Rehabilitation

## 2015-02-17 ENCOUNTER — Inpatient Hospital Stay (HOSPITAL_COMMUNITY): Payer: Self-pay

## 2015-02-17 DIAGNOSIS — S7291XS Unspecified fracture of right femur, sequela: Secondary | ICD-10-CM

## 2015-02-17 DIAGNOSIS — S2241XS Multiple fractures of ribs, right side, sequela: Secondary | ICD-10-CM

## 2015-02-17 DIAGNOSIS — W208XXS Other cause of strike by thrown, projected or falling object, sequela: Secondary | ICD-10-CM

## 2015-02-17 DIAGNOSIS — S82891A Other fracture of right lower leg, initial encounter for closed fracture: Secondary | ICD-10-CM

## 2015-02-17 LAB — CBC WITH DIFFERENTIAL/PLATELET
Basophils Absolute: 0 10*3/uL (ref 0.0–0.1)
Basophils Relative: 0 % (ref 0–1)
Eosinophils Absolute: 0.3 10*3/uL (ref 0.0–0.7)
Eosinophils Relative: 2 % (ref 0–5)
HEMATOCRIT: 27.1 % — AB (ref 39.0–52.0)
Hemoglobin: 8.8 g/dL — ABNORMAL LOW (ref 13.0–17.0)
LYMPHS PCT: 10 % — AB (ref 12–46)
Lymphs Abs: 1.2 10*3/uL (ref 0.7–4.0)
MCH: 28 pg (ref 26.0–34.0)
MCHC: 32.5 g/dL (ref 30.0–36.0)
MCV: 86.3 fL (ref 78.0–100.0)
MONO ABS: 0.9 10*3/uL (ref 0.1–1.0)
Monocytes Relative: 8 % (ref 3–12)
Neutro Abs: 9.4 10*3/uL — ABNORMAL HIGH (ref 1.7–7.7)
Neutrophils Relative %: 80 % — ABNORMAL HIGH (ref 43–77)
Platelets: 214 10*3/uL (ref 150–400)
RBC: 3.14 MIL/uL — AB (ref 4.22–5.81)
RDW: 14.8 % (ref 11.5–15.5)
WBC: 11.9 10*3/uL — ABNORMAL HIGH (ref 4.0–10.5)

## 2015-02-17 LAB — BASIC METABOLIC PANEL
Anion gap: 5 (ref 5–15)
BUN: 25 mg/dL — AB (ref 6–20)
CALCIUM: 8 mg/dL — AB (ref 8.9–10.3)
CHLORIDE: 106 mmol/L (ref 101–111)
CO2: 32 mmol/L (ref 22–32)
Creatinine, Ser: 0.9 mg/dL (ref 0.61–1.24)
GFR calc Af Amer: >60 mL/min (ref >60)
GLUCOSE: 99 mg/dL (ref 65–99)
Potassium: 3.4 mmol/L — ABNORMAL LOW (ref 3.5–5.1)
Sodium: 143 mmol/L (ref 135–145)

## 2015-02-17 LAB — GLUCOSE, CAPILLARY
GLUCOSE-CAPILLARY: 99 mg/dL (ref 65–99)
GLUCOSE-CAPILLARY: 152 mg/dL — AB (ref 65–99)
GLUCOSE-CAPILLARY: 172 mg/dL — AB (ref 65–99)
GLUCOSE-CAPILLARY: 165 mg/dL — AB (ref 65–99)
Glucose-Capillary: 101 mg/dL — ABNORMAL HIGH (ref 65–99)
Glucose-Capillary: 103 mg/dL — ABNORMAL HIGH (ref 65–99)
Glucose-Capillary: 126 mg/dL — ABNORMAL HIGH (ref 65–99)

## 2015-02-17 LAB — CULTURE, BLOOD (ROUTINE X 2)
Culture: NO GROWTH
Culture: NO GROWTH

## 2015-02-17 MED ORDER — POLYETHYLENE GLYCOL 3350 17 G PO PACK
17.0000 g | PACK | Freq: Every day | ORAL | Status: DC
  Administered 2015-02-17 – 2015-02-23 (×7): 17 g via ORAL
  Filled 2015-02-17 (×7): qty 1

## 2015-02-17 MED ORDER — ADULT MULTIVITAMIN W/MINERALS CH
1.0000 | ORAL_TABLET | Freq: Every day | ORAL | Status: DC
  Administered 2015-02-17 – 2015-02-23 (×7): 1 via ORAL
  Filled 2015-02-17 (×7): qty 1

## 2015-02-17 MED ORDER — DOCUSATE SODIUM 100 MG PO CAPS
100.0000 mg | ORAL_CAPSULE | Freq: Two times a day (BID) | ORAL | Status: DC
  Administered 2015-02-17 – 2015-02-23 (×12): 100 mg via ORAL
  Filled 2015-02-17 (×12): qty 1

## 2015-02-17 MED ORDER — SODIUM CHLORIDE 0.9 % IJ SOLN: 3.0000 mL | INTRAMUSCULAR | Status: DC | PRN

## 2015-02-17 MED ORDER — ENSURE ENLIVE PO LIQD
237.0000 mL | Freq: Two times a day (BID) | ORAL | Status: DC
  Administered 2015-02-17 – 2015-02-23 (×5): 237 mL via ORAL

## 2015-02-17 MED ORDER — HYDROMORPHONE HCL 1 MG/ML IJ SOLN
1.0000 mg | INTRAMUSCULAR | Status: DC | PRN
  Administered 2015-02-17 – 2015-02-18 (×5): 1 mg via INTRAVENOUS
  Administered 2015-02-19: 2 mg via INTRAVENOUS
  Administered 2015-02-19 (×2): 1 mg via INTRAVENOUS
  Filled 2015-02-17: qty 2
  Filled 2015-02-17 (×8): qty 1

## 2015-02-17 NOTE — Consult Note (Signed)
Physical Medicine and Rehabilitation Consult  Reason for Consult: Multitrauma Referring Physician: Dr. Hulen Skains.    HPI: Jeremy Burgess is a 49 y.o. male with history of HTN, DM- insulin dependent, who was admitted on 02/08/15 after the tree that he was cutting fell on him with complaints of  right torso, abdomen and hip pain. Work up revealed right displaced 2-9th rib fractures, moderate right PTX, right femoral shaft fracture, right pilon fracture and right ulna fracture. Patient developed hypotensive shock in ED with decreased LOC requiring intubation as well as pressors. Right chest tube placed for treatment of R-PTX and he was treated with 4 units PRBC and FFP. He was taken to OR for IM nail right femur, ORIF right ulna and external fixation of right ankle by Dr. Percell Miller.  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 No surgical intervention needed for rib fractures per Dr. Prescott Gum. He underwent ORIF right pilon fracture on Dr. Percell Miller on 07/05 and tolerated vent wean on 02/16/15. Therapy ongoing and CIR recommended for follow up therapy.   Review of Systems  HENT: Negative for hearing loss.   Eyes: Negative for blurred vision and double vision.  Respiratory: Negative for cough, shortness of breath and wheezing.   Cardiovascular: Positive for chest pain (right chest wall pain). Negative for palpitations.  Gastrointestinal: Negative for heartburn, nausea, abdominal pain and constipation.  Genitourinary: Negative for dysuria and urgency.  Musculoskeletal: Positive for joint pain. Negative for back pain and neck pain.  Neurological: Positive for weakness. Negative for dizziness and headaches.  Psychiatric/Behavioral: The patient is not nervous/anxious and does not have insomnia.      Past Medical History  Diagnosis Date  . Diabetes mellitus   . Hypertension     Past Surgical History  Procedure Laterality Date  . Femur im nail Right  02/09/2015    Procedure: INTRAMEDULLARY (IM) RETROGRADE FEMORAL NAILING;  Surgeon: [name], MD;  Location: [place];  Service: Orthopedics;  Laterality: Right;  . External fixation leg Right 02/09/2015    Procedure: EXTERNAL FIXATION LEG;  Surgeon: [name], MD;  Location: [place];  Service: Orthopedics;  Laterality: Right;  . Orif ulnar fracture Right 02/09/2015    Procedure: OPEN REDUCTION INTERNAL FIXATION (ORIF) ULNAR FRACTURE;  Surgeon: [name], MD;  Location: [place];  Service: Orthopedics;  Laterality: Right;  . Orif ankle fracture Right 02/14/2015    Procedure: OPEN REDUCTION INTERNAL FIXATION (ORIF) PILON FRACTURE;  Surgeon: [name], MD;  Location: [place];  Service: Orthopedics;  Laterality: Right;  . External fixation removal Right 02/14/2015    Procedure: REMOVAL EXTERNAL FIXATION LEG;  Surgeon: [name], MD;  Location: [place];  Service: Orthopedics;  Laterality: Right;  . Back surgery       Family History  Problem Relation Age of Onset  . Diabetes Mother   . COPD Mother       Social History: Lives with mother (disabled but independent and can provide supervision past discharge). 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 .    Allergies  Allergen Reactions  . Bee Venom Other (See Comments)    intolerance    Medications Prior to Admission  Medication Sig Dispense Refill  . insulin detemir (LEVEMIR) 100 UNIT/ML injection Inject 10 Units into the skin at bedtime.    [name] lisinopril (PRINIVIL,ZESTRIL) 10 MG tablet Take 10 mg by mouth daily.    [name]  naproxen sodium (ANAPROX) 220 MG tablet Take 220 mg by mouth daily as needed (for pain).    [name] sitaGLIPtin-metformin (JANUMET) 50-1000 MG per tablet Take 1 tablet by mouth 2 (two) times daily with a meal.      Home: Home Living Family/patient expects to be discharged to:: Private residence Living Arrangements: Other relatives (brother and sister in [employer]) Available Help at  Discharge: Family, Available 24 hours/day, Available PRN/intermittently Type of Home: House Home Access: Stairs to enter [employer] of Steps: 3 Home Layout: One level Home Equipment: None Additional Comments: Plan is for pt to go to brother's at discharge.  They plan to build a ramp if necessary   Functional History: Prior Function Level of Independence: Independent Comments: Pt works in [employer].  Fully independent, drives  Functional Status:  Mobility: Bed Mobility Overal bed mobility: Needs Assistance Bed Mobility: Supine to Sit, Sit to Supine Supine to sit: Mod assist, +2 for physical assistance Sit to supine: Max assist, +2 for physical assistance General bed mobility comments: pt assisted with bridging and moving both LE's, needed truncal assist, but assisted with w/shift and asymmetrical scoot. Transfers General transfer comment: unable       ADL: ADL Overall ADL's : Needs assistance/impaired Eating/Feeding: NPO Grooming: Wash/dry hands, Wash/dry face, Set up, Bed level Upper Body Bathing: Moderate assistance, Bed level Lower Body Bathing: Total assistance, Bed level, +2 for physical assistance Upper Body Dressing : Total assistance, Bed level Lower Body Dressing: Total assistance, Bed level, +2 for physical assistance Toilet Transfer: Total assistance Toilet Transfer Details (indicate cue type and reason): unable  Toileting- Clothing Manipulation and Hygiene: Total assistance, Bed level, +2 for physical assistance Functional mobility during ADLs: +2 for physical assistance, Total assistance General ADL Comments: Pt limited by pain   Cognition: Cognition Overall Cognitive Status: Difficult to assess Orientation Level: Oriented X4 Cognition Arousal/Alertness: Awake/alert Behavior During Therapy: Flat affect, Anxious Overall Cognitive Status: Difficult to assess Difficult to assess due to: Intubated    Blood pressure 159/63, pulse 75,  temperature 99.7 F (37.6 C), temperature source Core (Comment), resp. rate 21, height 6\' 2"  (1.88 m), weight 117.9 kg (259 lb 14.8 oz), SpO2 99 %. Physical Exam  Nursing note and vitals reviewed. Constitutional: He is oriented to person, place, and time. He appears well-developed and well-nourished.  HENT:  Head: Normocephalic and atraumatic.  Eyes: Conjunctivae are normal. Pupils are equal, round, and reactive to light.  Neck: Normal range of motion. Neck supple. No tracheal deviation present. Thyromegaly present.  Cardiovascular: Normal rate and regular rhythm.   Respiratory: Effort normal. He has no wheezes. He has rhonchi in the right middle field and the right lower field. He has no rales. He exhibits tenderness.  GI: Soft. Bowel sounds are normal. He exhibits no distension. There is no tenderness.  Musculoskeletal: He exhibits edema (right hand and left foot. ) and tenderness.  Keeps RLE extended outward and right ankle with compressive dressing. R hand with splint in place and sensation intact.   Neurological: He is alert and oriented to person, place, and time.  Speech clear. Alert and appropriate. Has reasonable insight and awareness. Follows basic commands without difficulty. RUE/RLE limited due to pain. Moves LUE/LLE without difficulty.   Skin: Skin is warm and dry.  Psychiatric: His speech is normal and behavior is normal. Thought content normal. Cognition and memory are not impaired. He does not express inappropriate judgment.    Results for orders placed or performed during the hospital  encounter of 02/08/15 (from the past 24 hour(s))  Glucose, capillary     Status: Abnormal   Collection Time: 02/16/15 12:25 PM  Result Value Ref Range   Glucose-Capillary 140 (H) 65 - 99 mg/dL   Comment 1 Notify RN    Comment 2 Document in Chart   Glucose, capillary     Status: Abnormal   Collection Time: 02/16/15  4:35 PM  Result Value Ref Range   Glucose-Capillary 110 (H) 65 - 99 mg/dL     Comment 1 Notify RN    Comment 2 Document in Chart   Glucose, capillary     Status: Abnormal   Collection Time: 02/16/15  7:52 PM  Result Value Ref Range   Glucose-Capillary 117 (H) 65 - 99 mg/dL  Glucose, capillary     Status: Abnormal   Collection Time: 02/16/15 11:17 PM  Result Value Ref Range   Glucose-Capillary 103 (H) 65 - 99 mg/dL  Glucose, capillary     Status: Abnormal   Collection Time: 02/17/15  3:15 AM  Result Value Ref Range   Glucose-Capillary 101 (H) 65 - 99 mg/dL  Basic metabolic panel     Status: Abnormal   Collection Time: 02/17/15  6:13 AM  Result Value Ref Range   Sodium 143 135 - 145 mmol/L   Potassium 3.4 (L) 3.5 - 5.1 mmol/L   Chloride 106 101 - 111 mmol/L   CO2 32 22 - 32 mmol/L   Glucose, Bld 99 65 - 99 mg/dL   BUN 25 (H) 6 - 20 mg/dL   Creatinine, Ser 0.90 0.61 - 1.24 mg/dL   Calcium 8.0 (L) 8.9 - 10.3 mg/dL   GFR calc non Af Amer >60 >60 mL/min   GFR calc Af Amer >60 >60 mL/min   Anion gap 5 5 - 15  CBC with Differential/Platelet     Status: Abnormal   Collection Time: 02/17/15  6:13 AM  Result Value Ref Range   WBC 11.9 (H) 4.0 - 10.5 K/uL   RBC 3.14 (L) 4.22 - 5.81 MIL/uL   Hemoglobin 8.8 (L) 13.0 - 17.0 g/dL   HCT 27.1 (L) 39.0 - 52.0 %   MCV 86.3 78.0 - 100.0 fL   MCH 28.0 26.0 - 34.0 pg   MCHC 32.5 30.0 - 36.0 g/dL   RDW 14.8 11.5 - 15.5 %   Platelets 214 150 - 400 K/uL   Neutrophils Relative % 80 (H) 43 - 77 %   Neutro Abs 9.4 (H) 1.7 - 7.7 K/uL   Lymphocytes Relative 10 (L) 12 - 46 %   Lymphs Abs 1.2 0.7 - 4.0 K/uL   Monocytes Relative 8 3 - 12 %   Monocytes Absolute 0.9 0.1 - 1.0 K/uL   Eosinophils Relative 2 0 - 5 %   Eosinophils Absolute 0.3 0.0 - 0.7 K/uL   Basophils Relative 0 0 - 1 %   Basophils Absolute 0.0 0.0 - 0.1 K/uL  Glucose, capillary     Status: None   Collection Time: 02/17/15  8:26 AM  Result Value Ref Range   Glucose-Capillary 99 65 - 99 mg/dL   Comment 1 Notify RN    Comment 2 Document in Chart    Dg  Chest Port 1 View  02/17/2015   CLINICAL DATA:  Chest trauma with multiple right rib fractures.  EXAM: PORTABLE CHEST - 1 VIEW  COMPARISON:  02/16/2015 and 02/15/2015  FINDINGS: Right chest tube in place. Multiple displaced right rib fractures again noted. No  pneumothorax. Lungs are clear except for minimal atelectasis at the left lung base. PICC appears in good position. NG tube and endotracheal tube have been removed. Heart size and vascularity are normal.  IMPRESSION: Minimal atelectasis at the left base.  No pneumothorax.   Electronically Signed   By: Lorriane Shire M.D.   On: 02/17/2015 07:53   Dg Chest Port 1 View  02/16/2015   CLINICAL DATA:  Chest trauma  EXAM: PORTABLE CHEST - 1 VIEW  COMPARISON:  Portable exam 0547 hours compared to 02/15/2015  FINDINGS: Tip of endotracheal tube at clavicular heads projecting 7.8 cm above carina.  Nasogastric tube extends into stomach.  LEFT arm PICC line with tip projecting over SVC.  RIGHT thoracostomy tube unchanged.  Normal heart size and mediastinal contours.  Bibasilar atelectasis greater on RIGHT.  No infiltrate, pleural effusion or pneumothorax.  IMPRESSION: Line and tube positions as above.  Bibasilar atelectasis greater on the RIGHT.   Electronically Signed   By: Lavonia Dana M.D.   On: 02/16/2015 08:03    Assessment/Plan: Diagnosis: polytrauma after tree accident 1. Does the need for close, 24 hr/day medical supervision in concert with the patient's rehab needs make it unreasonable for this patient to be served in a less intensive setting? Yes 2. Co-Morbidities requiring supervision/potential complications: pain, dm, wound care 3. Due to bladder management, bowel management, safety, skin/wound care, disease management, medication administration, pain management and patient education, does the patient require 24 hr/day rehab nursing? Yes 4. Does the patient require coordinated care of a physician, rehab nurse, PT (1-2 hrs/day, 5 days/week) and OT (1-2  hrs/day, 5 days/week) to address physical and functional deficits in the context of the above medical diagnosis(es)? Yes Addressing deficits in the following areas: balance, endurance, locomotion, strength, transferring, bowel/bladder control, bathing, dressing, feeding, grooming, toileting and psychosocial support 5. Can the patient actively participate in an intensive therapy program of at least 3 hrs of therapy per day at least 5 days per week? Yes 6. The potential for patient to make measurable gains while on inpatient rehab is excellent 7. Anticipated functional outcomes upon discharge from inpatient rehab are modified independent and supervision  with PT, modified independent and supervision with OT, n/a with SLP. 8. Estimated rehab length of stay to reach the above functional goals is: 9-13 days 9. Does the patient have adequate social supports and living environment to accommodate these discharge functional goals? Yes and Potentially 10. Anticipated D/C setting: Home 11. Anticipated post D/C treatments: [place] therapy 12. Overall Rehab/Functional Prognosis: excellent  RECOMMENDATIONS: This patient's condition is appropriate for continued rehabilitative care in the following setting: CIR Patient has agreed to participate in recommended program. Yes Note that insurance prior authorization may be required for reimbursement for recommended care.  Comment: Rehab Admissions Coordinator to follow up.  Thanks,  Meredith Staggers, MD, Mellody Drown     02/17/2015

## 2015-02-17 NOTE — Plan of Care (Signed)
Problem: Phase II Progression Outcomes Goal: Date pt extubated/weaned off vent Outcome: Completed/Met Date Met:  02/17/15 02/16/2015

## 2015-02-17 NOTE — Progress Notes (Signed)
Physical Therapy Treatment Patient Details Name: Jeremy Burgess MRN: [id number] DOB: Sep 11, 1965 Today's Date: 02/17/2015    History of Present Illness Pt admitted after large tree limb fell on him.  He sustained Rt displaced rib fractures 2-9, moderate Rt pneumothorax/pleural effusion - pt orally intubated, Rt CT placed; Rt femur  fracture s/p IMN ( knee and hip ROM as tolerated); Rt ankle/pilon  fracture s/p ex fix with plan for surgery 02/13/14 NWB Rt LE; Rt ulna fracture s/p ORIF WBAT through elbow, ? small liver laceration; hematoma quad/adductor muscles.  PMH includes: IDDM, HTN s .  s/p ORIF R ankle 7/6.  Extubated 7/7.    PT Comments    Pt admitted with above diagnosis. Pt currently with functional limitations due to balance and endurance deficits. Pt able to sit EOB 15 min with supervision for most part.  Improving.  Next visit will try to stand pivot with platform RW.   Pt will benefit from skilled PT to increase their independence and safety with mobility to allow discharge to the venue listed below.    Follow Up Recommendations  CIR     Equipment Recommendations  Other (comment) (TBA)    Recommendations for Other Services Rehab consult     Precautions / Restrictions Precautions Precautions: Fall Precaution Comments: Rt rib fractures, CT Restrictions Weight Bearing Restrictions: Yes RUE Weight Bearing: Weight bearing as tolerated RLE Weight Bearing: Non weight bearing    Mobility  Bed Mobility Overal bed mobility: Needs Assistance Bed Mobility: Supine to Sit;Sit to Supine     Supine to sit: Mod assist;+2 for physical assistance Sit to supine: Max assist;+2 for physical assistance   General bed mobility comments: pt assisted with bridging and moving both LE's, needed truncal assist, but assisted with w/shift and asymmetrical scoot.  Transfers                    Ambulation/Gait                 Stairs            Wheelchair Mobility     Modified Rankin (Stroke Patients Only)       Balance Overall balance assessment: Needs assistance Sitting-balance support: Single extremity supported;Feet supported Sitting balance-Leahy Scale: Fair Sitting balance - Comments: sat EOB 15 with/with L UE assist vs. no UE assist.  Needed assist with w/shifting and scooting.                            Cognition Arousal/Alertness: Awake/alert Behavior During Therapy: Flat affect;Anxious Overall Cognitive Status: Impaired/Different from baseline Area of Impairment: Following commands;Safety/judgement;Awareness;Problem solving       Following Commands: Follows one step commands inconsistently;Follows one step commands with increased time Safety/Judgement: Decreased awareness of safety;Decreased awareness of deficits Awareness: Intellectual Problem Solving: Difficulty sequencing;Requires verbal cues;Requires tactile cues;Slow processing      Exercises General Exercises - Lower Extremity Ankle Circles/Pumps: AROM;10 reps;Seated;Left Long Arc Quad: AROM;Left;10 reps;Seated    General Comments        Pertinent Vitals/Pain Pain Assessment: Faces Faces Pain Scale: Hurts whole lot Pain Location: right chest ribs Pain Descriptors / Indicators: Sore;Pressure Pain Intervention(s): Limited activity within patient's tolerance;Monitored during session;Premedicated before session;Repositioned  VSS    Home Living                      Prior Function  PT Goals (current goals can now be found in the care plan section) Progress towards PT goals: Progressing toward goals    Frequency  Min 3X/week    PT Plan Current plan remains appropriate    Co-evaluation             End of Session Equipment Utilized During Treatment: Gait belt;Oxygen Activity Tolerance: Patient tolerated treatment well;Patient limited by fatigue Patient left: in bed;with call bell/phone within reach;with bed alarm set;with  SCD's reapplied     Time: 1427-6701 PT Time Calculation (min) (ACUTE ONLY): 20 min  Charges:  $Therapeutic Activity: 8-22 mins                    G CodesDenice Paradise 02-26-15, 3:59 PM [employer] Acute Rehabilitation [phone] (pager)

## 2015-02-17 NOTE — Progress Notes (Signed)
Trauma Service Note  Subjective: Patient sitting up in bed.   Coughing very well.  Productive cough.  Alert and awake.  Objective: Vital signs in last 24 hours: Temp:  [99.1 F (37.3 C)-100.9 F (38.3 C)] 99.1 F (37.3 C) (07/08 0600) Pulse Rate:  [64-80] 64 (07/08 0600) Resp:  [13-25] 21 (07/08 0500) BP: (139-163)/(52-85) 160/60 mmHg (07/08 0600) SpO2:  [93 %-100 %] 98 % (07/08 0743) FiO2 (%):  [40 %] 40 % (07/07 0829) Weight:  [117.9 kg (259 lb 14.8 oz)] 117.9 kg (259 lb 14.8 oz) (07/08 0500) Last BM Date:  (PTA)  Intake/Output from previous day: 07/07 0701 - 07/08 0700 In: 1990 [P.O.:400; I.V.:1190; NG/GT:100; IV Piggyback:300] Out: 2805 [Urine:2375; Chest Tube:430] Intake/Output this shift:    General: No acute distress at rest.  Lungs: Clear to auscultation.  No air leak.  CXR shows no pneumothorax.  430cc output yesterday.  Abd: Soft, good bowel sounds.  Tolerating diet well.  Extremities: No clinical signs or symptoms of DVT  Neuro: Intact  Lab Results: CBC   Recent Labs  02/16/15 0419 02/17/15 0613  WBC 10.4 11.9*  HGB 8.2* 8.8*  HCT 25.4* 27.1*  PLT 179 214   BMET  Recent Labs  02/16/15 0419 02/17/15 0613  NA 144 143  K 3.2* 3.4*  CL 107 106  CO2 31 32  GLUCOSE 258* 99  BUN 32* 25*  CREATININE 0.95 0.90  CALCIUM 7.6* 8.0*   PT/INR No results for input(s): LABPROT, INR in the last 72 hours. ABG  Recent Labs  02/14/15 1751 02/15/15 0858  PHART 7.409 7.387  HCO3 29.6* 28.3*    Studies/Results: Dg Chest Port 1 View  02/17/2015   CLINICAL DATA:  Chest trauma with multiple right rib fractures.  EXAM: PORTABLE CHEST - 1 VIEW  COMPARISON:  02/16/2015 and 02/15/2015  FINDINGS: Right chest tube in place. Multiple displaced right rib fractures again noted. No pneumothorax. Lungs are clear except for minimal atelectasis at the left lung base. PICC appears in good position. NG tube and endotracheal tube have been removed. Heart size and  vascularity are normal.  IMPRESSION: Minimal atelectasis at the left base.  No pneumothorax.   Electronically Signed   By: Lorriane Shire M.D.   On: 02/17/2015 07:53   Dg Chest Port 1 View  02/16/2015   CLINICAL DATA:  Chest trauma  EXAM: PORTABLE CHEST - 1 VIEW  COMPARISON:  Portable exam 0547 hours compared to 02/15/2015  FINDINGS: Tip of endotracheal tube at clavicular heads projecting 7.8 cm above carina.  Nasogastric tube extends into stomach.  LEFT arm PICC line with tip projecting over SVC.  RIGHT thoracostomy tube unchanged.  Normal heart size and mediastinal contours.  Bibasilar atelectasis greater on RIGHT.  No infiltrate, pleural effusion or pneumothorax.  IMPRESSION: Line and tube positions as above.  Bibasilar atelectasis greater on the RIGHT.   Electronically Signed   By: Lavonia Dana M.D.   On: 02/16/2015 08:03    Anti-infectives: Anti-infectives    Start     Dose/Rate Route Frequency Ordered Stop   02/14/15 1400  ceFAZolin (ANCEF) IVPB 2 g/50 mL premix     2 g 100 mL/hr over 30 Minutes Intravenous 3 times per day 02/14/15 1105     02/14/15 0700  ceFAZolin (ANCEF) IVPB 2 g/50 mL premix     2 g 100 mL/hr over 30 Minutes Intravenous On call to O.R. 02/13/15 1326 02/14/15 0805   02/14/15 0600  ceFAZolin (ANCEF) IVPB  2 g/50 mL premix  Status:  Discontinued     2 g 100 mL/hr over 30 Minutes Intravenous On call to O.R. 02/13/15 1324 02/13/15 1326   02/12/15 1400  piperacillin-tazobactam (ZOSYN) IVPB 3.375 g  Status:  Discontinued     3.375 g 12.5 mL/hr over 240 Minutes Intravenous Every 8 hours 02/12/15 0854 02/14/15 1105   02/12/15 1045  vancomycin (VANCOCIN) 1,250 mg in sodium chloride 0.9 % 250 mL IVPB  Status:  Discontinued     1,250 mg 166.7 mL/hr over 90 Minutes Intravenous Every 12 hours 02/12/15 1040 02/14/15 1105   02/09/15 2030  ceFAZolin (ANCEF) IVPB 2 g/50 mL premix     2 g 100 mL/hr over 30 Minutes Intravenous Every 6 hours 02/09/15 1841 02/10/15 0910   02/09/15 1230   ceFAZolin (ANCEF) IVPB 2 g/50 mL premix     2 g 100 mL/hr over 30 Minutes Intravenous To ShortStay Surgical 02/08/15 1418 02/09/15 1425      Assessment/Plan: s/p Procedure(s): OPEN REDUCTION INTERNAL FIXATION (ORIF) PILON FRACTURE REMOVAL EXTERNAL FIXATION LEG d/c foley Advance diet Chest tube to waterseal.  Too much output to remove the chest tube. Cut back on IV. Oral pain medications. Rehab consultation  LOS: 8 days   Kathryne Eriksson. Dahlia Bailiff, MD, FACS [phone] Trauma Surgeon 02/17/2015

## 2015-02-17 NOTE — Plan of Care (Signed)
Problem: Phase II Progression Outcomes Goal: Time pt extubated/weaned off vent Outcome: Completed/Met Date Met:  02/17/15 1114

## 2015-02-17 NOTE — Progress Notes (Signed)
Rehab admissions - I met with pt in follow up to rehab consult to give information about our rehab program. Further questions were answered and informational brochures were given. Pt is interested in pursuing inpatient rehab. Pt stated he would talk to his mother/brother and I did not need to reach out to them.  I will follow pt's progress and check on his status on Monday.  Thanks.  Nanetta Batty, PT Rehabilitation Admissions Coordinator [phone]

## 2015-02-17 NOTE — Progress Notes (Signed)
Nutrition Follow-up   INTERVENTION:  Pt needs bowel regimen (no bm x 8 days), pt is passing gas, spoke with RN  MVI  Ensure Enlive (each supplement provides 350kcal and 20 grams of protein)  NUTRITION DIAGNOSIS:  Increased nutrient needs related to  (fractures) as evidenced by estimated needs.  ongiong  GOAL:  Patient will meet greater than or equal to 90% of their needs  Not yet met.   MONITOR:  PO intake, Supplement acceptance, Labs, I & O's  REASON FOR ASSESSMENT:  Ventilator    ASSESSMENT:  49 y.o. Male with history of HTN and DM, presents as level 1 trauma after a 1.5 ft diameter tree fell on him while he was cutting it.Intubated for airway protection. Right displaced rib fractures 2-9 Moderate right pneumothorax/pleural effusion Right femur fracture Right ankle fracture Right ulna fracture Enlargement of right adrenal gland ?nodule vs hematoma Free fluid of right liver edge ? Small liver laceration  POD#1 R IM nail of the femur, Ex-fix of the R ankle and ORIF of the R Ulna.  Pt extubated 7/7 Chest tube output too high to pull Labs reviewed: potassium low, cbgs: 99-103 Medications reviewed. Weight increased by 30 lb, pt is 16 L positive since admission Pt discussed during ICU rounds and with RN.   Height:  Ht Readings from Last 1 Encounters:  02/08/15 '6\' 2"'  (1.88 m)    Weight:  Wt Readings from Last 1 Encounters:  02/17/15 259 lb 14.8 oz (117.9 kg)    Ideal Body Weight:  86.4 kg  Wt Readings from Last 10 Encounters:  02/17/15 259 lb 14.8 oz (117.9 kg)    BMI:  Body mass index is 33.36 kg/(m^2).  Estimated Nutritional Needs:  Kcal:  2400-2600  Protein:  120-140 grams  Fluid:  > 2.4 L/day  Skin:  Wound (see comment) (multiple incisions)  Diet Order:  Diet Carb Modified Fluid consistency:: Thin; Room service appropriate?: Yes with Assist  EDUCATION NEEDS:  No education needs identified at this time   Intake/Output Summary  (Last 24 hours) at 02/17/15 1353 Last data filed at 02/17/15 0800  Gross per 24 hour  Intake   1400 ml  Output   2520 ml  Net  -1120 ml    Last BM:  6/28  Maylon Peppers RD, [place], [place] Pager [phone] After Hours Pager

## 2015-02-17 NOTE — PMR Pre-admission (Signed)
PMR Admission Coordinator Pre-Admission Assessment  Patient: Jeremy Burgess is an 49 y.o., male MRN: [id number] DOB: Sep 04, 1965 Height: 6\' 2"  (188 cm) Weight: 117.9 kg (259 lb 14.8 oz)              Insurance Information  PRIMARY:  Self pay        Emergency Contact Information Contact Information    Name Relation Home Work Jeremy Burgess Mother [phone]     Jeremy Burgess Brother   [phone]   Jeremy Burgess Brother   [phone]   Jeremy Burgess Sister   [phone]     Current Medical History  Patient Admitting Diagnosis: polytrauma after tree accident  History of Present Illness: Jeremy Burgess is a 49 y.o. male with history of HTN, DM- insulin dependent, who was admitted on 02/08/15 after the tree that he was cutting fell on him with complaints of  right torso, abdomen and hip pain. Work up revealed right displaced 2-9th rib fractures, moderate right PTX, right femoral shaft fracture, right pilon fracture and right ulna fracture. Patient developed hypotensive shock in ED with decreased LOC requiring intubation as well as pressors. Right chest tube placed for treatment of R-PTX and he was treated with 4 units PRBC and FFP. He was taken to OR for IM nail right femur, ORIF right ulna and external fixation of right ankle by Dr. Percell Miller.  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 No surgical intervention needed for rib fractures per Dr. Prescott Gum. He underwent ORIF right pilon fracture on Dr. Percell Miller on 07/05 and tolerated vent wean on 02/16/15. Chest tube has been removed with no need for pleurex to be placed.  Therapy ongoing and CIR recommended for follow up therapy.  Past Medical History  Past Medical History  Diagnosis Date  . Diabetes mellitus   . Hypertension   . Smoker 0    denies smoking    Family History  family history includes COPD in his mother; Diabetes in his mother.  Prior Rehab/Hospitalizations:  Has  the patient had major surgery during 100 days prior to admission? No   Pt had previous outpt rehab following back surgery.  Current Medications   Current facility-administered medications:  .  acetaminophen (TYLENOL) solution 650 mg, 650 mg, Per Tube, Q6H PRN, Judeth Horn, MD, 650 mg at 02/11/15 2023 .  antiseptic oral rinse (CPC / CETYLPYRIDINIUM CHLORIDE 0.05%) solution 7 mL, 7 mL, Mouth Rinse, BID, Judeth Horn, MD, 7 mL at 02/22/15 1019 .  cefUROXime (ZINACEF) 1.5 g in dextrose 5 % 50 mL IVPB, 1.5 g, Intravenous, Once, Ivin Poot, MD .  docusate sodium (COLACE) capsule 100 mg, 100 mg, Oral, BID, Judeth Horn, MD, 100 mg at 02/22/15 2252 .  enoxaparin (LOVENOX) injection 30 mg, 30 mg, Subcutaneous, Q12H, Lisette Abu, PA-C, 30 mg at 02/22/15 1020 .  feeding supplement (ENSURE ENLIVE) (ENSURE ENLIVE) liquid 237 mL, 237 mL, Oral, BID BM, Heather C Pitts, RD, 237 mL at 02/22/15 1435 .  HYDROmorphone (DILAUDID) injection 1-2 mg, 1-2 mg, Intravenous, Q4H PRN, Judeth Horn, MD, 2 mg at 02/19/15 1148 .  insulin aspart (novoLOG) injection 0-20 Units, 0-20 Units, Subcutaneous, TID AC & HS, Judeth Horn, MD, 3 Units at 02/22/15 1254 .  insulin detemir (LEVEMIR) injection 10 Units, 10 Units, Subcutaneous, BID, Judeth Horn, MD, 10 Units at 02/22/15 2252 .  ipratropium-albuterol (DUONEB) 0.5-2.5 (3) MG/3ML nebulizer solution 3 mL, 3 mL, Nebulization, Q4H PRN, Judeth Horn, MD .  linagliptin (TRADJENTA) tablet 5 mg, 5 mg, Oral, BID WC, 5 mg at 02/22/15 1946 **AND** metFORMIN (GLUCOPHAGE) tablet 1,000 mg, 1,000 mg, Oral, BID WC, Lisette Abu, PA-C, 1,000 mg at 02/22/15 1945 .  lisinopril (PRINIVIL,ZESTRIL) tablet 10 mg, 10 mg, Oral, Daily, Lisette Abu, PA-C, 10 mg at 02/22/15 1020 .  magnesium citrate solution 1 Bottle, 1 Bottle, Oral, Once, Lisette Abu, PA-C .  multivitamin with minerals tablet 1 tablet, 1 tablet, Oral, Daily, Asencion Islam, RD, 1 tablet at 02/22/15 1020 .   ondansetron (ZOFRAN) tablet 4 mg, 4 mg, Oral, Q6H PRN **OR** ondansetron (ZOFRAN) injection 4 mg, 4 mg, Intravenous, Q6H PRN, Nat Christen, PA-C, 4 mg at 02/14/15 0834 .  oxyCODONE (Oxy IR/ROXICODONE) immediate release tablet 5-10 mg, 5-10 mg, Oral, Q3H PRN, Lovett Calender, PA-C, 10 mg at 02/23/15 0544 .  polyethylene glycol (MIRALAX / GLYCOLAX) packet 17 g, 17 g, Oral, Daily, Judeth Horn, MD, 17 g at 02/22/15 1020 .  sodium chloride 0.9 % bolus 1,000 mL, 1,000 mL, Intravenous, Once, [employer], PA-C .  sodium chloride 0.9 % injection 10-40 mL, 10-40 mL, Intracatheter, Q12H, Judeth Horn, MD, 10 mL at 02/22/15 1000 .  sodium chloride 0.9 % injection 10-40 mL, 10-40 mL, Intracatheter, PRN, Judeth Horn, MD, 10 mL at 02/21/15 0939 .  sodium chloride 0.9 % injection 3 mL, 3 mL, Intravenous, PRN, Judeth Horn, MD  Patients Current Diet: Diet Carb Modified Fluid consistency:: Thin; Room service appropriate?: Yes with Assist  Precautions / Restrictions Precautions Precautions: Other (comment) Precaution Comments: Rt rib fractures, CTs,   Restrictions Weight Bearing Restrictions: Yes RUE Weight Bearing: Weight bearing as tolerated RLE Weight Bearing: Non weight bearing   Has the patient had 2 or more falls or a fall with injury in the past year? 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   Home Assistive Devices / Equipment Home Assistive Devices/Equipment: None Home Equipment: None  Prior Device Use: Indicate devices/aids used by the patient prior to current illness, exacerbation or injury? None of the above  Prior Functional Level Prior Function Level of Independence: Independent Comments: Pt works in [employer].  Fully independent, drives   Self Care: Did the patient need help bathing, dressing, using the toilet or eating?  Independent  Indoor Mobility:  Did the patient need assistance with walking from room to room (with or without device)? Independent  Stairs: Did the patient need assistance with internal or external stairs (with or without device)? Independent  Functional Cognition: Did the patient need help planning regular tasks such as shopping or remembering to take medications? Independent  Current Functional Level Cognition  Overall Cognitive Status: Within Functional Limits for tasks assessed Difficult to assess due to: Intubated Orientation Level: Oriented X4 Following Commands: Follows one step commands inconsistently, Follows one step commands with increased time Safety/Judgement: Decreased awareness of safety, Decreased awareness of deficits    Extremity Assessment (includes Sensation/Coordination)  Upper Extremity Assessment: Defer to OT evaluation RUE Deficits / Details: Pt s/p ORIF ulna.  AROM WFL Rt UE   Lower Extremity Assessment: LLE deficits/detail, RLE deficits/detail RLE Deficits / Details: WFL LLE: Unable to fully assess due to pain    ADLs  Overall ADL's : Needs assistance/impaired Eating/Feeding: NPO Grooming: Wash/dry face, Set up, Bed level Upper Body Bathing: Moderate assistance, Bed level Lower Body Bathing: Total  assistance, Bed level, +2 for physical assistance Upper Body Dressing : Total assistance, Bed level Lower Body Dressing: Total assistance, Bed level, +2 for physical assistance Toilet Transfer: Total assistance Toilet Transfer Details (indicate cue type and reason): unable  Toileting- Clothing Manipulation and Hygiene: Total assistance, Bed level, +2 for physical assistance Functional mobility during ADLs: +2 for physical assistance, Total assistance General ADL Comments: Pt up with PT earlier to stand at EOB briefly. Worked on UB bathing and grooming this visit but pt limited with use of L hand due to sharp pain reported at base of L thumb and wrist. Pt states he is unable to squeeze call  button with L hand even. He was only able to lightly hold washcloth to wash UB due to pain. Note some edema at base of thumb also. Informed nursing and she states she will get a soft touch call button for pt. Pt able to move R digits actively and he states this is much improved since admission. Pt declines wanting to perform LB bathing right now and states pain 5/10 right now at chest tube site. Called for pain meds. Encouraged pt to perform bilateral shoulder ROM to maintain strength.     Mobility  Overal bed mobility: Needs Assistance Bed Mobility: Supine to Sit, Sit to Supine Supine to sit: Max assist, +2 for physical assistance Sit to supine: Max assist, +2 for physical assistance General bed mobility comments: pt assisted with bridging and moving both LE's, needed truncal assist, but assisted with w/shift and asymmetrical scoot.    Transfers  Overall transfer level: Needs assistance Equipment used: Right platform walker Transfers: Sit to/from Stand Sit to Stand: +2 physical assistance, Max assist, From elevated surface (cues for hand and LE placement, reminders for NWB Rt LE ) General transfer comment: bed-bedside commode-chair-bed    Ambulation / Gait / Stairs / Wheelchair Mobility   Not assessed, anticipate needs   Posture / Balance Dynamic Sitting Balance Sitting balance - Comments: sat EOB 15 with/with L UE assist vs. no UE assist.  Needed assist with w/shifting and scooting. Balance Overall balance assessment: Needs assistance Sitting-balance support: Bilateral upper extremity supported Sitting balance-Leahy Scale: Poor Sitting balance - Comments: sat EOB 15 with/with L UE assist vs. no UE assist.  Needed assist with w/shifting and scooting. Standing balance support: Bilateral upper extremity supported Standing balance-Leahy Scale: Poor    Special needs/care consideration BiPAP/CPAP no CPM no  Continuous Drip IV no  Dialysis no          Life Vest no Oxygen no Special Bed   Trach Size no  Wound Vac (area) no       Skin: R LE ace wrapped, R wrist splint                            Bowel mgmt: Last documented BM 02/07/15 Bladder mgmt: WDL Diabetic mgmt - yes     Previous Home Environment Living Arrangements: Other relatives (brother and sister in law) Available Help at Discharge: Family, Available 24 hours/day, Available PRN/intermittently Type of Home: House Home Layout: One level Home Access: Stairs to enter [employer] of Steps: 3 Bathroom Shower/Tub: Tub/shower unit, Walk-in shower, Tub only Biochemist, clinical: Standard Home Care Services: No Additional Comments: Plan is for pt to go to brother's at discharge.  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 Pt's brother works but is home in the evenings. Caregiver Availability: 24/7 Discharge Plan Discussed with Primary Caregiver: Yes (discussed by phone with pt's mother and brother) Is Caregiver In Agreement with Plan?: Yes Does Caregiver/Family have Issues with Lodging/Transportation while Pt is in Rehab?: No  Goals/Additional Needs Patient/Family Goal for Rehab: Mod Ind and Supervision with PT/OT; NA for  SLP Expected length of stay: 9-13 days Cultural Considerations: none Dietary Needs: carb modified Equipment Needs: to be determined Pt/Family Agrees to Admission and willing to participate: Yes Program Orientation Provided & Reviewed with Pt/Caregiver Including Roles  & Responsibilities: Yes  Decrease burden of Care through IP rehab admission: NA  Possible need for SNF placement upon discharge: not anticipated  Patient Condition: This patient's medical and functional status has changed since the consult dated: 02/17/15 in which the Rehabilitation Physician determined and documented that the patient's condition is appropriate for intensive rehabilitative care in an inpatient rehabilitation facility. See "History of Present Illness" (above) for medical update. Functional changes are: Curently requiring max assist +2 for transfers, no ambulation. Patient's medical and functional status update has been discussed with the Rehabilitation physician and patient remains appropriate for inpatient rehabilitation. Will admit to inpatient rehab today.  Preadmission Screen Completed By:  Nanetta Batty, PT, 02/23/2015 9:58 AM ______________________________________________________________________   Discussed status with Dr. Letta Pate on 02/23/15 at 1009 and received telephone approval for admission today.  Admission Coordinator:  Nanetta Batty, PT, time1009/Date07/14/16

## 2015-02-17 NOTE — Progress Notes (Signed)
     Subjective:  POD#3 ORIF R ankle and POD#6 IM nail of R femur and ORIF R Ulna. Patient reports pain as mild to moderate.  Patient was extubated yesterday.  Able to sit at the side of the bed yesterday with PT.  They recommend inpatient rehab.  Case manager working on placement.   Objective:   VITALS:   Filed Vitals:   02/17/15 1000 02/17/15 1100 02/17/15 1140 02/17/15 1414  BP: 155/70 146/60    Pulse: 73 64 82   Temp:   97.9 F (36.6 C)   TempSrc:   Oral   Resp: 24 19 25    Height:      Weight:      SpO2: 100% 97% 100% 100%    Neurologically intact ABD soft Neurovascular intact Sensation intact distally Intact pulses distally Incision: dressing C/D/I R leg splinted  Lab Results  Component Value Date   WBC 11.9* 02/17/2015   HGB 8.8* 02/17/2015   HCT 27.1* 02/17/2015   MCV 86.3 02/17/2015   PLT 214 02/17/2015   BMET    Component Value Date/Time   NA 143 02/17/2015 0613   K 3.4* 02/17/2015 0613   CL 106 02/17/2015 0613   CO2 32 02/17/2015 0613   GLUCOSE 99 02/17/2015 0613   BUN 25* 02/17/2015 0613   CREATININE 0.90 02/17/2015 0613   CALCIUM 8.0* 02/17/2015 0613   GFRNONAA >60 02/17/2015 0613   GFRAA >60 02/17/2015 9024     Assessment/Plan: 3 Days Post-Op   Active Problems:   Accidentally struck by falling tree   Insulin dependent diabetes mellitus   Traumatic closed displaced fracture of multiple ribs of right side   Closed fracture of right ulna   Femur fracture, right   Ankle fracture, right   Acute blood loss anemia   Thrombocytopenia   Acute renal failure   Acute respiratory failure   Traumatic hemopneumothorax   Up with therapy NWB in the RLE, WBAT in the R elbow for use of platform walker Lovenox for DVT prophylaxis  OK from ortho standpoint to be transferred to rehab once stable and cleared by medicine/critical care    Sherrilynn Gudgel [name] 02/17/2015, 3:13 PM Cell [phone]

## 2015-02-18 ENCOUNTER — Encounter (HOSPITAL_COMMUNITY): Payer: Self-pay

## 2015-02-18 ENCOUNTER — Inpatient Hospital Stay (HOSPITAL_COMMUNITY): Payer: Self-pay

## 2015-02-18 LAB — GLUCOSE, CAPILLARY
GLUCOSE-CAPILLARY: 120 mg/dL — AB (ref 65–99)
Glucose-Capillary: 80 mg/dL (ref 65–99)
Glucose-Capillary: 127 mg/dL — ABNORMAL HIGH (ref 65–99)
Glucose-Capillary: 152 mg/dL — ABNORMAL HIGH (ref 65–99)
Glucose-Capillary: 145 mg/dL — ABNORMAL HIGH (ref 65–99)

## 2015-02-18 MED ORDER — INSULIN ASPART 100 UNIT/ML ~~LOC~~ SOLN
0.0000 [IU] | Freq: Three times a day (TID) | SUBCUTANEOUS | Status: DC
  Administered 2015-02-19 (×2): 3 [IU] via SUBCUTANEOUS
  Administered 2015-02-20: 4 [IU] via SUBCUTANEOUS
  Administered 2015-02-20 – 2015-02-21 (×2): 3 [IU] via SUBCUTANEOUS
  Administered 2015-02-21: 4 [IU] via SUBCUTANEOUS
  Administered 2015-02-22: 3 [IU] via SUBCUTANEOUS

## 2015-02-18 NOTE — Progress Notes (Signed)
     Subjective:  POD#4 ORIF R ankle and POD#7 IM nail of the R femur and ORIF R ulna. Patient reports pain as mild to moderate.  Resting comfortably in bed.  Was able to sit on the side of the bed for 15 min with PT yesterday.  They plan to try to stand with platform walker today. He will be transferred up to 6N today.   Objective:   VITALS:   Filed Vitals:   02/18/15 0725 02/18/15 0800 02/18/15 0900 02/18/15 1028  BP:  144/50  144/59  Pulse:  68 75 65  Temp:  98.2 F (36.8 C)    TempSrc:  Axillary    Resp:  25 22 18   Height:      Weight:      SpO2: 95% 98% 97% 93%    Neurologically intact ABD soft Neurovascular intact Sensation intact distally Intact pulses distally Incision: dressing C/D/I R leg splinted  Lab Results  Component Value Date   WBC 11.9* 02/17/2015   HGB 8.8* 02/17/2015   HCT 27.1* 02/17/2015   MCV 86.3 02/17/2015   PLT 214 02/17/2015   BMET    Component Value Date/Time   NA 143 02/17/2015 0613   K 3.4* 02/17/2015 0613   CL 106 02/17/2015 0613   CO2 32 02/17/2015 0613   GLUCOSE 99 02/17/2015 0613   BUN 25* 02/17/2015 0613   CREATININE 0.90 02/17/2015 0613   CALCIUM 8.0* 02/17/2015 0613   GFRNONAA >60 02/17/2015 0613   GFRAA >60 02/17/2015 7001     Assessment/Plan: 4 Days Post-Op   Active Problems:   Accidentally struck by falling tree   Insulin dependent diabetes mellitus   Traumatic closed displaced fracture of multiple ribs of right side   Closed fracture of right ulna   Femur fracture, right   Ankle fracture, right   Acute blood loss anemia   Thrombocytopenia   Acute renal failure   Acute respiratory failure   Traumatic hemopneumothorax   Up with therapy NWB in the RLE and WBAT in the R elbow for use of platform walker Lovenox for DVT prophylaxis  OK from ortho standpoint to be transferred to rehab once stable and cleared by medicine/critical care   Jeremy Burgess 02/18/2015, 11:26 AM Cell [phone]

## 2015-02-18 NOTE — Progress Notes (Signed)
4 Days Post-Op  Subjective: Pt doing well this AM.  Pain controlled.  Tol PO  Objective: Vital signs in last 24 hours: Temp:  [97.4 F (36.3 C)-98.3 F (36.8 C)] 97.4 F (36.3 C) (07/09 0400) Pulse Rate:  [56-82] 68 (07/09 0800) Resp:  [13-28] 25 (07/09 0800) BP: (109-159)/(48-72) 144/50 mmHg (07/09 0800) SpO2:  [90 %-100 %] 98 % (07/09 0800) Last BM Date: 02/07/15  Intake/Output from previous day: 07/08 0701 - 07/09 0700 In: 1510 [P.O.:1360; IV Piggyback:150] Out: 1990 [Urine:1850; Chest Tube:140] Intake/Output this shift: Total I/O In: 200 [P.O.:200] Out: -   General appearance: alert and cooperative GI: soft, non-tender; bowel sounds normal; no masses,  no organomegaly  Lab Results:   Recent Labs  02/16/15 0419 02/17/15 0613  WBC 10.4 11.9*  HGB 8.2* 8.8*  HCT 25.4* 27.1*  PLT 179 214   BMET  Recent Labs  02/16/15 0419 02/17/15 0613  NA 144 143  K 3.2* 3.4*  CL 107 106  CO2 31 32  GLUCOSE 258* 99  BUN 32* 25*  CREATININE 0.95 0.90  CALCIUM 7.6* 8.0*   PT/INR No results for input(s): LABPROT, INR in the last 72 hours. ABG  Recent Labs  02/15/15 0858  PHART 7.387  HCO3 28.3*    Studies/Results: Dg Chest Port 1 View  02/17/2015   CLINICAL DATA:  Chest trauma with multiple right rib fractures.  EXAM: PORTABLE CHEST - 1 VIEW  COMPARISON:  02/16/2015 and 02/15/2015  FINDINGS: Right chest tube in place. Multiple displaced right rib fractures again noted. No pneumothorax. Lungs are clear except for minimal atelectasis at the left lung base. PICC appears in good position. NG tube and endotracheal tube have been removed. Heart size and vascularity are normal.  IMPRESSION: Minimal atelectasis at the left base.  No pneumothorax.   Electronically Signed   By: Lorriane Shire M.D.   On: 02/17/2015 07:53    Anti-infectives: Anti-infectives    Start     Dose/Rate Route Frequency Ordered Stop   02/14/15 1400  ceFAZolin (ANCEF) IVPB 2 g/50 mL premix     2  g 100 mL/hr over 30 Minutes Intravenous 3 times per day 02/14/15 1105 02/20/15 2359   02/14/15 0700  ceFAZolin (ANCEF) IVPB 2 g/50 mL premix     2 g 100 mL/hr over 30 Minutes Intravenous On call to O.R. 02/13/15 1326 02/14/15 0805   02/14/15 0600  ceFAZolin (ANCEF) IVPB 2 g/50 mL premix  Status:  Discontinued     2 g 100 mL/hr over 30 Minutes Intravenous On call to O.R. 02/13/15 1324 02/13/15 1326   02/12/15 1400  piperacillin-tazobactam (ZOSYN) IVPB 3.375 g  Status:  Discontinued     3.375 g 12.5 mL/hr over 240 Minutes Intravenous Every 8 hours 02/12/15 0854 02/14/15 1105   02/12/15 1045  vancomycin (VANCOCIN) 1,250 mg in sodium chloride 0.9 % 250 mL IVPB  Status:  Discontinued     1,250 mg 166.7 mL/hr over 90 Minutes Intravenous Every 12 hours 02/12/15 1040 02/14/15 1105   02/09/15 2030  ceFAZolin (ANCEF) IVPB 2 g/50 mL premix     2 g 100 mL/hr over 30 Minutes Intravenous Every 6 hours 02/09/15 1841 02/10/15 0910   02/09/15 1230  ceFAZolin (ANCEF) IVPB 2 g/50 mL premix     2 g 100 mL/hr over 30 Minutes Intravenous To ShortStay Surgical 02/08/15 1418 02/09/15 1425      Assessment/Plan: s/p Procedure(s): OPEN REDUCTION INTERNAL FIXATION (ORIF) PILON FRACTURE REMOVAL EXTERNAL FIXATION LEG  Chest tube to waterseal- con't high output  Oral pain medications. Rehab consultation To floor  LOS: 9 days    Rosario Jacks., Anne Hahn 02/18/2015

## 2015-02-19 ENCOUNTER — Encounter (HOSPITAL_COMMUNITY): Payer: Self-pay

## 2015-02-19 LAB — GLUCOSE, CAPILLARY
GLUCOSE-CAPILLARY: 105 mg/dL — AB (ref 65–99)
Glucose-Capillary: 148 mg/dL — ABNORMAL HIGH (ref 65–99)
Glucose-Capillary: 148 mg/dL — ABNORMAL HIGH (ref 65–99)

## 2015-02-19 MED ORDER — ENOXAPARIN SODIUM 40 MG/0.4ML ~~LOC~~ SOLN
40.0000 mg | SUBCUTANEOUS | Status: DC
  Administered 2015-02-19 – 2015-02-21 (×3): 40 mg via SUBCUTANEOUS
  Filled 2015-02-19 (×3): qty 0.4

## 2015-02-19 NOTE — Progress Notes (Signed)
     Subjective:  POD#5 ORIF R ankle and POD#8 IM nail of the R femur and ORIF R ulna.Patient reports pain as moderate.  Resting comfortably in bed.  Nursing attempted to get the patient up to a chair today but he was unable to tolerate the lift.  PT refused to see today due to limited staffing.  I would like to have him transferred to 5N so they can better handle his limitations and PT will be more available.  Objective:   VITALS:   Filed Vitals:   02/18/15 2039 02/18/15 2218 02/19/15 0536 02/19/15 0742  BP:  142/59 127/60   Pulse: 81 76 68   Temp:  98.5 F (36.9 C) 97.2 F (36.2 C)   TempSrc:  Oral Oral   Resp: 16 20 16    Height:      Weight:      SpO2: 94% 92% 96% 98%    Neurologically intact ABD soft Neurovascular intact Sensation intact distally Intact pulses distally Dorsiflexion/Plantar flexion intact Incision: dressing C/D/I R leg splinted  Lab Results  Component Value Date   WBC 11.9* 02/17/2015   HGB 8.8* 02/17/2015   HCT 27.1* 02/17/2015   MCV 86.3 02/17/2015   PLT 214 02/17/2015   BMET    Component Value Date/Time   NA 143 02/17/2015 0613   K 3.4* 02/17/2015 0613   CL 106 02/17/2015 0613   CO2 32 02/17/2015 0613   GLUCOSE 99 02/17/2015 0613   BUN 25* 02/17/2015 0613   CREATININE 0.90 02/17/2015 0613   CALCIUM 8.0* 02/17/2015 0613   GFRNONAA >60 02/17/2015 0613   GFRAA >60 02/17/2015 1308     Assessment/Plan: 5 Days Post-Op   Active Problems:   Accidentally struck by falling tree   Insulin dependent diabetes mellitus   Traumatic closed displaced fracture of multiple ribs of right side   Closed fracture of right ulna   Femur fracture, right   Ankle fracture, right   Acute blood loss anemia   Thrombocytopenia   Acute renal failure   Acute respiratory failure   Traumatic hemopneumothorax   Up with therapy NWB in the RLE and WBAT in the R elbow in order to use a platform walker Lovenox for DVT prophylaxis OK from ortho standpoint to  d/c to rehab once cleared medically  Will transfer to 5N for better PT in the meantime.   Jeremy Burgess Lelan Pons 02/19/2015, 12:00 PM Cell [phone]

## 2015-02-19 NOTE — Progress Notes (Signed)
General Surgery Note  LOS: 10 days  POD -  5 Days Post-Op  Assessment/Plan: 1.  Struck by falling tree  2.  ORIF of R femur and ORIF R Ulna - 02/09/2015 - T. Murphy  On Ancef  On PT - needs a lot of assist getting out of bed  3.  ORIF of Right ankle - 02/14/2015 - T. Murphy  4.  CT on right chest  Right rib fxes 2-9  Still had apical pneumo on 7/9 CXR.  No film ordered today.  Still with significant output in CT.  Will repeat CXR tomorrow. 5.  IDDM 6.  HTN  7.  DVT prophylaxis - will start lovenox 8.  Hgb - 8.8 - 02/19/2015 9.  Left wrist pain - probably from A line.  Warm compresses   Active Problems:   Accidentally struck by falling tree   Insulin dependent diabetes mellitus   Traumatic closed displaced fracture of multiple ribs of right side   Closed fracture of right ulna   Femur fracture, right   Ankle fracture, right   Acute blood loss anemia   Thrombocytopenia   Acute renal failure   Acute respiratory failure   Traumatic hemopneumothorax   Subjective:  Doing well.  Complains of left wrist.  Breathing okay. Objective:   Filed Vitals:   02/19/15 0536  BP: 127/60  Pulse: 68  Temp: 97.2 F (36.2 C)  Resp: 16     Intake/Output from previous day:  07/09 0701 - 07/10 0700 In: 920 [P.O.:920] Out: 2100 [Urine:1750; Chest Tube:350]  Intake/Output this shift:      Physical Exam:   General: Bearded WM who is alert and oriented.    HEENT: Normal. Pupils equal. .   Lungs: Modest inspiratory effort.  IS - 1,200 cc Left CT with 400 cc last 24 hours - not on suction.   Abdomen: Soft.   Wound: Right arm and right leg wrapped/splinted.  Left wrist mild swelling and redness.   Lab Results:    Recent Labs  02/17/15 0613  WBC 11.9*  HGB 8.8*  HCT 27.1*  PLT 214    BMET   Recent Labs  02/17/15 0613  NA 143  K 3.4*  CL 106  CO2 32  GLUCOSE 99  BUN 25*  CREATININE 0.90  CALCIUM 8.0*    PT/INR  No results for input(s): LABPROT, INR in the last 72  hours.  ABG  No results for input(s): PHART, HCO3 in the last 72 hours.  Invalid input(s): PCO2, PO2   Studies/Results:  Dg Chest Port 1 View  02/18/2015   CLINICAL DATA:  Chest trauma with multiple displaced right rib fractures and pneumothorax.  EXAM: PORTABLE CHEST - 1 VIEW  COMPARISON:  02/17/2015  FINDINGS: Right chest tube and left arm PICC line remain in appropriate position. A small approximately 10% right apical pneumothorax is seen on today's study. Multiple displaced right rib fractures are again demonstrated with mild right hemothorax. Lungs are otherwise clear. Heart size is within normal limits allowing for low lung volumes.  IMPRESSION: Small approximately 10% right apical pneumothorax, with right chest tube remaining in place.  Persistent low lung volumes, mild right hemothorax, and multiple displaced right rib fractures.   Electronically Signed   By: Earle Gell M.D.   On: 02/18/2015 10:06     Anti-infectives:   Anti-infectives    Start     Dose/Rate Route Frequency Ordered Stop   02/14/15 1400  ceFAZolin (ANCEF) IVPB 2 g/50  mL premix     2 g 100 mL/hr over 30 Minutes Intravenous 3 times per day 02/14/15 1105 02/20/15 2359   02/14/15 0700  ceFAZolin (ANCEF) IVPB 2 g/50 mL premix     2 g 100 mL/hr over 30 Minutes Intravenous On call to O.R. 02/13/15 1326 02/14/15 0805   02/14/15 0600  ceFAZolin (ANCEF) IVPB 2 g/50 mL premix  Status:  Discontinued     2 g 100 mL/hr over 30 Minutes Intravenous On call to O.R. 02/13/15 1324 02/13/15 1326   02/12/15 1400  piperacillin-tazobactam (ZOSYN) IVPB 3.375 g  Status:  Discontinued     3.375 g 12.5 mL/hr over 240 Minutes Intravenous Every 8 hours 02/12/15 0854 02/14/15 1105   02/12/15 1045  vancomycin (VANCOCIN) 1,250 mg in sodium chloride 0.9 % 250 mL IVPB  Status:  Discontinued     1,250 mg 166.7 mL/hr over 90 Minutes Intravenous Every 12 hours 02/12/15 1040 02/14/15 1105   02/09/15 2030  ceFAZolin (ANCEF) IVPB 2 g/50 mL premix      2 g 100 mL/hr over 30 Minutes Intravenous Every 6 hours 02/09/15 1841 02/10/15 0910   02/09/15 1230  ceFAZolin (ANCEF) IVPB 2 g/50 mL premix     2 g 100 mL/hr over 30 Minutes Intravenous To ShortStay Surgical 02/08/15 1418 02/09/15 1425      Alphonsa Overall, MD, FACS Pager: [facility] Office: [phone] 02/19/2015

## 2015-02-19 NOTE — Progress Notes (Signed)
Attempted to get patient out of bed with maxi lift. The patient was in a lot of pain and refused to be lifted to the chair.

## 2015-02-19 NOTE — Progress Notes (Signed)
Report given to Tranace on 5N.

## 2015-02-20 ENCOUNTER — Inpatient Hospital Stay (HOSPITAL_COMMUNITY): Payer: Self-pay

## 2015-02-20 LAB — GLUCOSE, CAPILLARY
GLUCOSE-CAPILLARY: 76 mg/dL (ref 65–99)
GLUCOSE-CAPILLARY: 148 mg/dL — AB (ref 65–99)
GLUCOSE-CAPILLARY: 156 mg/dL — AB (ref 65–99)
GLUCOSE-CAPILLARY: 138 mg/dL — AB (ref 65–99)
Glucose-Capillary: 160 mg/dL — ABNORMAL HIGH (ref 65–99)

## 2015-02-20 MED ORDER — IPRATROPIUM-ALBUTEROL 0.5-2.5 (3) MG/3ML IN SOLN: 3.0000 mL | RESPIRATORY_TRACT | Status: DC | PRN

## 2015-02-20 NOTE — Progress Notes (Signed)
6 Days Post-Op  Subjective: Pt doing well Some L wrist soreness  Objective: Vital signs in last 24 hours: Temp:  [97.5 F (36.4 C)-98.6 F (37 C)] 98.3 F (36.8 C) (07/11 0543) Pulse Rate:  [70-77] 70 (07/11 0543) Resp:  [16-20] 17 (07/11 0543) BP: (123-140)/(59-64) 131/59 mmHg (07/11 0543) SpO2:  [91 %-97 %] 93 % (07/11 0543) FiO2 (%):  [40 %] 40 % (07/10 2128) Last BM Date: 02/07/15  Intake/Output from previous day: 07/10 0701 - 07/11 0700 In: 480 [P.O.:480] Out: 1850 [Urine:1750; Chest Tube:100] Intake/Output this shift:    General appearance: alert and cooperative Cardio: regular rate and rhythm, S1, S2 normal, no murmur, click, rub or gallop GI: soft, non-tender; bowel sounds normal; no masses,  no organomegaly Extremities: L LE in splint  Lab Results:  No results for input(s): WBC, HGB, HCT, PLT in the last 72 hours. BMET No results for input(s): NA, K, CL, CO2, GLUCOSE, BUN, CREATININE, CALCIUM in the last 72 hours. PT/INR No results for input(s): LABPROT, INR in the last 72 hours. ABG No results for input(s): PHART, HCO3 in the last 72 hours.  Invalid input(s): PCO2, PO2  Studies/Results: Dg Chest 1 View  02/20/2015   CLINICAL DATA:  Right rib fractures.  EXAM: CHEST  1 VIEW  COMPARISON:  02/18/2015.  FINDINGS: Right chest tube in stable position. Left PICC line stable position. Small stable right apical pneumothorax. Stable cardiomegaly. Low lung volumes. Multiple displaced right rib fractures again noted. Right clavicular fracture noted .  IMPRESSION: 1. Right chest tube and PICC line stable position. Stable small right apical pneumothorax again noted. 2. Stable cardiomegaly. 3. Lung volumes again noted.  Small right pleural effusion. 4. Multiple displaced right rib fractures noted. Minimally displaced distal right clavicular fracture noted.   Electronically Signed   By: Marcello Moores  Register   On: 02/20/2015 07:53    Anti-infectives: Anti-infectives    Start      Dose/Rate Route Frequency Ordered Stop   02/14/15 1400  ceFAZolin (ANCEF) IVPB 2 g/50 mL premix     2 g 100 mL/hr over 30 Minutes Intravenous 3 times per day 02/14/15 1105 02/20/15 2359   02/14/15 0700  ceFAZolin (ANCEF) IVPB 2 g/50 mL premix     2 g 100 mL/hr over 30 Minutes Intravenous On call to O.R. 02/13/15 1326 02/14/15 0805   02/14/15 0600  ceFAZolin (ANCEF) IVPB 2 g/50 mL premix  Status:  Discontinued     2 g 100 mL/hr over 30 Minutes Intravenous On call to O.R. 02/13/15 1324 02/13/15 1326   02/12/15 1400  piperacillin-tazobactam (ZOSYN) IVPB 3.375 g  Status:  Discontinued     3.375 g 12.5 mL/hr over 240 Minutes Intravenous Every 8 hours 02/12/15 0854 02/14/15 1105   02/12/15 1045  vancomycin (VANCOCIN) 1,250 mg in sodium chloride 0.9 % 250 mL IVPB  Status:  Discontinued     1,250 mg 166.7 mL/hr over 90 Minutes Intravenous Every 12 hours 02/12/15 1040 02/14/15 1105   02/09/15 2030  ceFAZolin (ANCEF) IVPB 2 g/50 mL premix     2 g 100 mL/hr over 30 Minutes Intravenous Every 6 hours 02/09/15 1841 02/10/15 0910   02/09/15 1230  ceFAZolin (ANCEF) IVPB 2 g/50 mL premix     2 g 100 mL/hr over 30 Minutes Intravenous To ShortStay Surgical 02/08/15 1418 02/09/15 1425      Assessment/Plan: Struck by falling tree ORIF of R femur and ORIF R Ulna - 02/09/2015 - T. Percell Miller  On PT -  needs a lot of assist getting out of bed ORIF of Right ankle - 02/14/2015 - T. Murphy Right rib fxes 2-9 R PTX-stable, CT on right chest on water seal output serous 100cc/24, if output con't to DC possible out tomorrow. IDDM HTN Left wrist pain- probably from A line, rec ice pack Anemia-Hgb - 8.8 - 02/19/2015 DVT prophylaxis - will start lovenox  Dispo-CIR pending      LOS: 11 days    Rosario Jacks., [facility] 02/20/2015

## 2015-02-20 NOTE — Progress Notes (Signed)
Occupational Therapy Treatment Patient Details Name: Jeremy Burgess MRN: [id number] DOB: Apr 30, 1966 Today's Date: 02/20/2015    History of present illness Pt admitted after large tree limb fell on him.  He sustained Rt displaced rib fractures 2-9, moderate Rt pneumothorax/pleural effusion - pt orally intubated, Rt CT placed; Rt femur  fracture s/p IMN ( knee and hip ROM as tolerated); Rt ankle/pilon  fracture s/p ex fix with plan for surgery 02/13/14 NWB Rt LE; Rt ulna fracture s/p ORIF WBAT through elbow, ? small liver laceration; hematoma quad/adductor muscles.  PMH includes: IDDM, HTN s .  s/p ORIF R ankle 7/6.  Extubated 7/7.   OT comments  Pt reporting sharp pain in L thumb and wrist area. Informed nursing and requested also a soft touch call button. Nursing states she will follow up with MD.  Focused session on UB bathing and grooming from bed level. Will follow. Agree with recommendation for CIR.     Follow Up Recommendations  CIR;Supervision/Assistance - 24 hour    Equipment Recommendations  None recommended by OT (defer next venue)    Recommendations for Other Services      Precautions / Restrictions Precautions Precautions: Fall Precaution Comments: Rt rib fractures, CT Restrictions Weight Bearing Restrictions: Yes RUE Weight Bearing: Weight bearing as tolerated RLE Weight Bearing: Non weight bearing       Mobility Bed Mobility              Transfer              Balance                              ADL       Grooming: Wash/dry face;Set up;Bed level   Upper Body Bathing: Moderate assistance;Bed level                             General ADL Comments: Pt up with PT earlier to stand at EOB briefly. Worked on UB bathing and grooming this visit but pt limited with use of L hand due to sharp pain reported at base of L thumb and wrist. Pt states he is unable to squeeze call button with L hand even. He was only able to lightly hold  washcloth to wash UB due to pain. Note some edema at base of thumb also. Informed nursing and she states she will get a soft touch call button for pt. Pt able to move R digits actively and he states this is much improved since admission. Pt declines wanting to perform LB bathing right now and states pain 5/10 right now at chest tube site. Called for pain meds. Encouraged pt to perform bilateral shoulder ROM to maintain strength.       Vision                     Perception     Praxis      Cognition   Behavior During Therapy: Flat affect Overall Cognitive Status: Within Functional Limits for tasks assessed                       Extremity/Trunk Assessment               Exercises     Shoulder Instructions       General Comments      Pertinent Vitals/ Pain  Pain Assessment: 0-10 Pain Score: 5  Pain Location: R chest tube site, L thumb/wrist Pain Descriptors / Indicators: Sharp Pain Intervention(s): Patient requesting pain meds-RN notified  Home Living                                          Prior Functioning/Environment              Frequency Min 3X/week     Progress Toward Goals  OT Goals(current goals can now be found in the care plan section)  Progress towards OT goals:  (limited by pain this visit)     Plan Discharge plan remains appropriate    Co-evaluation                 End of Session     Activity Tolerance Patient limited by pain   Patient Left in bed;with call bell/phone within reach   Nurse Communication          Time: 1114-1140 OT Time Calculation (min): 26 min  Charges: OT General Charges $OT Visit: 1 Procedure OT Treatments $Self Care/Home Management : 23-37 mins  Jules Schick  [phone] 02/20/2015, 12:07 PM

## 2015-02-20 NOTE — Progress Notes (Signed)
     Subjective:  POD#6 ORIF R ankle and POD#9 IM nail of the R femur and ORIF R ulna.  Patient reports pain as moderate.  Resting comfortably in bed.  Patient was able to stand with PT and begin to work with platform walker.  Inpatient rehab will accept patient.  Chest tubes may come out tomorrow per trauma.  If stable, he may be able to be transferred tomorrow.   Objective:   VITALS:   Filed Vitals:   02/19/15 2128 02/20/15 0543 02/20/15 0900 02/20/15 1437  BP:  131/59  127/61  Pulse: 72 70  76  Temp:  98.3 F (36.8 C)  98.7 F (37.1 C)  TempSrc:  Oral  Oral  Resp: 18 17  18   Height:      Weight:      SpO2: 94% 93% 97% 96%    Neurologically intact ABD soft Neurovascular intact Sensation intact distally Intact pulses distally Incision: dressing C/D/I R leg splinted  Lab Results  Component Value Date   WBC 11.9* 02/17/2015   HGB 8.8* 02/17/2015   HCT 27.1* 02/17/2015   MCV 86.3 02/17/2015   PLT 214 02/17/2015   BMET    Component Value Date/Time   NA 143 02/17/2015 0613   K 3.4* 02/17/2015 0613   CL 106 02/17/2015 0613   CO2 32 02/17/2015 0613   GLUCOSE 99 02/17/2015 0613   BUN 25* 02/17/2015 0613   CREATININE 0.90 02/17/2015 0613   CALCIUM 8.0* 02/17/2015 0613   GFRNONAA >60 02/17/2015 0613   GFRAA >60 02/17/2015 9629     Assessment/Plan: 6 Days Post-Op   Active Problems:   Accidentally struck by falling tree   Insulin dependent diabetes mellitus   Traumatic closed displaced fracture of multiple ribs of right side   Closed fracture of right ulna   Femur fracture, right   Ankle fracture, right   Acute blood loss anemia   Thrombocytopenia   Acute renal failure   Acute respiratory failure   Traumatic hemopneumothorax   Up with therapy  NWB in the RLE and WBAT in the R elbow in order to use a platform walker Lovenox for DVT prophylaxis OK from ortho standpoint to d/c to rehab once cleared medically    Manna Gose [name] 02/20/2015, 5:00  PM Cell [phone]

## 2015-02-20 NOTE — Progress Notes (Signed)
Physical Therapy Treatment Patient Details Name: Jeremy Burgess MRN: [id number] DOB: 02-12-1966 Today's Date: 02/20/2015    History of Present Illness Pt admitted after large tree limb fell on him.  He sustained Rt displaced rib fractures 2-9, moderate Rt pneumothorax/pleural effusion - pt orally intubated, Rt CT placed; Rt femur  fracture s/p IMN ( knee and hip ROM as tolerated); Rt ankle/pilon  fracture s/p ex fix with plan for surgery 02/13/14 NWB Rt LE; Rt ulna fracture s/p ORIF WBAT through elbow, ? small liver laceration; hematoma quad/adductor muscles.  PMH includes: IDDM, HTN s .  s/p ORIF R ankle 7/6.  Extubated 7/7.    PT Comments    Patient able to stand edge of bed with platform walker <29minute with max assist of 2. Max assist in standing also required to maintain NWB status on RLE. Required return to bed due to fatigue. Recommending further rehabilitation before D/C home.   Follow Up Recommendations  CIR     Equipment Recommendations  None recommended by PT    Recommendations for Other Services Rehab consult     Precautions / Restrictions Precautions Precautions: Fall Precaution Comments: Rt rib fractures, CT Restrictions Weight Bearing Restrictions: Yes RUE Weight Bearing: Weight bearing as tolerated (through elbow) RLE Weight Bearing: Non weight bearing    Mobility  Bed Mobility Overal bed mobility: Needs Assistance Bed Mobility: Supine to Sit;Sit to Supine     Supine to sit: Max assist;+2 for physical assistance Sit to supine: Max assist;+2 for physical assistance      Transfers Overall transfer level: Needs assistance Equipment used: Right platform walker Transfers: Sit to/from Stand Sit to Stand: Max assist;+2 physical assistance;From elevated surface         General transfer comment: stood <12minute  Ambulation/Gait                 Stairs            Wheelchair Mobility    Modified Rankin (Stroke Patients Only)       Balance  Overall balance assessment: Needs assistance Sitting-balance support: Single extremity supported Sitting balance-Leahy Scale: Poor     Standing balance support: Bilateral upper extremity supported Standing balance-Leahy Scale: Poor                      Cognition Arousal/Alertness: Awake/alert Behavior During Therapy: Flat affect Overall Cognitive Status: Within Functional Limits for tasks assessed                      Exercises      General Comments        Pertinent Vitals/Pain Pain Assessment: 0-10 Pain Score: 5  Pain Descriptors / Indicators:  (pain) Pain Intervention(s): Limited activity within patient's tolerance;Monitored during session;Repositioned    Home Living                      Prior Function            PT Goals (current goals can now be found in the care plan section) Acute Rehab PT Goals PT Goal Formulation: With patient/family Time For Goal Achievement: 02/27/15 Potential to Achieve Goals: Good Progress towards PT goals: Progressing toward goals    Frequency  Min 3X/week    PT Plan Current plan remains appropriate    Co-evaluation             End of Session   Activity Tolerance: Patient limited by fatigue Patient left:  in bed;with call bell/phone within reach;with SCD's reapplied     Time: 0932-0952 PT Time Calculation (min) (ACUTE ONLY): 20 min  Charges:  $Therapeutic Activity: 8-22 mins                    G Codes:      Cassell Clement, PT, CSCS Pager [phone] Office [phone]  02/20/2015, 10:19 AM

## 2015-02-20 NOTE — Progress Notes (Signed)
Rehab admissions - I am following pt's case and spoke with trauma PA. Per trauma, chest tubes are likely to be pulled tomorrow and they are hopeful that pt will be medically ready for CIR tomorrow. I met with pt to share this update and he is agreeable with this plan. He stated he was able to get up and stand with therapy today. Pt stated he had not spoken with his brother about CIR yet and he was agreeable that I reach out to his family.  I then called pt's mother and brother Legrand Como) to share information about our rehab program. Further questions were answered and they are in full support of pt coming to CIR. Legrand Como shared that the plan is for pt to come to his home and either he or pt's sister-in-law will be able to provide needed support.  I will check on pt's status tomorrow and proceed accordingly. Thanks.  Nanetta Batty, PT Rehabilitation Admissions Coordinator [phone]

## 2015-02-20 NOTE — Progress Notes (Signed)
Chaplain initiated follow up with pt. Chaplain offered prayer and informed pt of her continued services as needed. Pt appreciated chaplain stopping by. Page chaplain as needed.   02/20/15 1400  Clinical Encounter Type  Visited With Patient  Visit Type Spiritual support;Follow-up  Spiritual Encounters  Spiritual Needs Prayer;Emotional  Jeremy Burgess 02/20/2015 2:36 PM

## 2015-02-20 NOTE — Clinical Social Work Note (Signed)
Clinical Social Worker continuing to follow patient and family for support and discharge planning needs.  Per chart, patient will likely have chest tubes removed tomorrow, with plans to transition to inpatient rehab.  Inpatient rehab admissions coordinator has confirmed patient discharge plan with family and is prepared to accept patient pending bed availability once medically ready.  CSW remains available for support and to assist with patient discharge needs if deemed appropriate.  Jeremy Burgess, Jeremy Burgess

## 2015-02-21 ENCOUNTER — Inpatient Hospital Stay (HOSPITAL_COMMUNITY): Payer: Self-pay

## 2015-02-21 LAB — GLUCOSE, CAPILLARY
GLUCOSE-CAPILLARY: 151 mg/dL — AB (ref 65–99)
GLUCOSE-CAPILLARY: 102 mg/dL — AB (ref 65–99)
Glucose-Capillary: 135 mg/dL — ABNORMAL HIGH (ref 65–99)

## 2015-02-21 NOTE — Progress Notes (Addendum)
     Subjective:  POD#7 ORIF R ankle and POD#10 IM nail of the R femur and ORIF R ulna. Patient reports pain as moderate.  Resting comfortably in bed.    Was able to stand with platform walker when working with PT yesterday.  Patient reports he felt very weak and short of breath. Pt also complained of sharp pain in L hand with bathing yesterday.  Xrays of hand and wrist revealed a possible small avulsion fx of the 4th metacarpal. Once chest tube in removed, the patient will be transferred to inpatient rehab.    Objective:   VITALS:   Filed Vitals:   02/20/15 0900 02/20/15 1437 02/20/15 2314 02/21/15 0500  BP:  127/61 132/58 128/64  Pulse:  76 65 74  Temp:  98.7 F (37.1 C) 98.4 F (36.9 C) 97.8 F (36.6 C)  TempSrc:  Oral Oral Oral  Resp:  18 20 19   Height:      Weight:      SpO2: 97% 96% 95% 97%    Neurologically intact ABD soft Neurovascular intact Sensation intact distally Intact pulses distally Incision: dressing C/D/I R ankle splinted.  Lab Results  Component Value Date   WBC 11.9* 02/17/2015   HGB 8.8* 02/17/2015   HCT 27.1* 02/17/2015   MCV 86.3 02/17/2015   PLT 214 02/17/2015   BMET    Component Value Date/Time   NA 143 02/17/2015 0613   K 3.4* 02/17/2015 0613   CL 106 02/17/2015 0613   CO2 32 02/17/2015 0613   GLUCOSE 99 02/17/2015 0613   BUN 25* 02/17/2015 0613   CREATININE 0.90 02/17/2015 0613   CALCIUM 8.0* 02/17/2015 0613   GFRNONAA >60 02/17/2015 0613   GFRAA >60 02/17/2015 4982     Assessment/Plan: 7 Days Post-Op   Active Problems:   Accidentally struck by falling tree   Insulin dependent diabetes mellitus   Traumatic closed displaced fracture of multiple ribs of right side   Closed fracture of right ulna   Femur fracture, right   Ankle fracture, right   Acute blood loss anemia   Thrombocytopenia   Acute renal failure   Acute respiratory failure   Traumatic hemopneumothorax   Up with therapy NWB in the RLE and WBAT in the R  elbow in order to use a platform walker Lovenox for DVT prophylaxis OK from ortho standpoint to d/c to rehab once cleared medically    Jeremy Burgess Jeremy Burgess 02/21/2015, 8:15 AM Cell [phone]

## 2015-02-21 NOTE — Progress Notes (Signed)
Patient ID: Jeremy Burgess, male   DOB: 1965/12/11, 49 y.o.   MRN: [id number] 7 Days Post-Op  Subjective: C/O L hand pain, pain in R ribs after moving  Objective: Vital signs in last 24 hours: Temp:  [97.8 F (36.6 C)-98.7 F (37.1 C)] 97.8 F (36.6 C) (07/12 0500) Pulse Rate:  [65-76] 74 (07/12 0500) Resp:  [18-20] 19 (07/12 0500) BP: (127-132)/(58-64) 128/64 mmHg (07/12 0500) SpO2:  [95 %-97 %] 97 % (07/12 0500) Last BM Date: 02/07/15  Intake/Output from previous day: 07/11 0701 - 07/12 0700 In: 240 [P.O.:240] Out: 2620 [Urine:1920; Chest Tube:700] Intake/Output this shift:    General appearance: alert and cooperative Resp: clear to auscultation bilaterally Chest wall: right sided chest wall tenderness Cardio: regular rate and rhythm GI: soft, NT, active BS  RUE and RLE ortho dressings  Lab Results: CBC  No results for input(s): WBC, HGB, HCT, PLT in the last 72 hours. BMET No results for input(s): NA, K, CL, CO2, GLUCOSE, BUN, CREATININE, CALCIUM in the last 72 hours. PT/INR No results for input(s): LABPROT, INR in the last 72 hours. ABG No results for input(s): PHART, HCO3 in the last 72 hours.  Invalid input(s): PCO2, PO2  Studies/Results: Dg Chest 1 View  02/20/2015   CLINICAL DATA:  Right rib fractures.  EXAM: CHEST  1 VIEW  COMPARISON:  02/18/2015.  FINDINGS: Right chest tube in stable position. Left PICC line stable position. Small stable right apical pneumothorax. Stable cardiomegaly. Low lung volumes. Multiple displaced right rib fractures again noted. Right clavicular fracture noted .  IMPRESSION: 1. Right chest tube and PICC line stable position. Stable small right apical pneumothorax again noted. 2. Stable cardiomegaly. 3. Lung volumes again noted.  Small right pleural effusion. 4. Multiple displaced right rib fractures noted. Minimally displaced distal right clavicular fracture noted.   Electronically Signed   By: Marcello Moores  Register   On: 02/20/2015 07:53   Dg  Wrist Complete Left  02/20/2015   CLINICAL DATA:  Injury 2 weeks ago with bruising and swelling on left hand at the first, second and third metacarpals, initial encounter.  EXAM: LEFT WRIST - COMPLETE 3+ VIEW  COMPARISON:  None.  FINDINGS: No acute osseous or joint abnormality. There may be mild degenerative change at the first carpometacarpal joint.  IMPRESSION: No acute osseous abnormality.   Electronically Signed   By: Lorin Picket M.D.   On: 02/20/2015 15:39   Dg Chest Port 1 View  02/21/2015   CLINICAL DATA:  Reassess right-sided pneumothorax.  EXAM: PORTABLE CHEST - 1 VIEW  COMPARISON:  02/20/2015  FINDINGS: The heart is enlarged but stable. The left PICC line is stable. The right chest tube is unchanged. There is a stable small apical pneumothorax estimated at 5%. Numerous right-sided rib fractures are again noted with probable small pleural effusion and pulmonary contusion. The left lung remains relatively clear.  IMPRESSION: Stable support apparatus.  Persistent small right apical pneumothorax.  Stable right-sided pleural effusion and atelectasis or contusion.   Electronically Signed   By: Marijo Sanes M.D.   On: 02/21/2015 07:22   Dg Hand Complete Left  02/20/2015   CLINICAL DATA:  Tree limb fell on left hand 2 weeks ago  EXAM: LEFT HAND - COMPLETE 3+ VIEW  COMPARISON:  None.  FINDINGS: There is a tiny fragment adjacent to the meta carpal head of the fourth digit on the ulnar border.  There is a curvilinear ossific fragment adjacent to the head of the proximal phalanx  of the third digit which appears degenerative. No displaced fracture. No dislocation. Atherosclerotic calcification noted.  IMPRESSION: Potential small avulsion fracture adjacent to the head of the fourth metacarpal.   Electronically Signed   By: Suzy Bouchard M.D.   On: 02/20/2015 15:42    Anti-infectives: Anti-infectives    Start     Dose/Rate Route Frequency Ordered Stop   02/14/15 1400  ceFAZolin (ANCEF) IVPB 2 g/50 mL  premix     2 g 100 mL/hr over 30 Minutes Intravenous 3 times per day 02/14/15 1105 02/20/15 2323   02/14/15 0700  ceFAZolin (ANCEF) IVPB 2 g/50 mL premix     2 g 100 mL/hr over 30 Minutes Intravenous On call to O.R. 02/13/15 1326 02/14/15 0805   02/14/15 0600  ceFAZolin (ANCEF) IVPB 2 g/50 mL premix  Status:  Discontinued     2 g 100 mL/hr over 30 Minutes Intravenous On call to O.R. 02/13/15 1324 02/13/15 1326   02/12/15 1400  piperacillin-tazobactam (ZOSYN) IVPB 3.375 g  Status:  Discontinued     3.375 g 12.5 mL/hr over 240 Minutes Intravenous Every 8 hours 02/12/15 0854 02/14/15 1105   02/12/15 1045  vancomycin (VANCOCIN) 1,250 mg in sodium chloride 0.9 % 250 mL IVPB  Status:  Discontinued     1,250 mg 166.7 mL/hr over 90 Minutes Intravenous Every 12 hours 02/12/15 1040 02/14/15 1105   02/09/15 2030  ceFAZolin (ANCEF) IVPB 2 g/50 mL premix     2 g 100 mL/hr over 30 Minutes Intravenous Every 6 hours 02/09/15 1841 02/10/15 0910   02/09/15 1230  ceFAZolin (ANCEF) IVPB 2 g/50 mL premix     2 g 100 mL/hr over 30 Minutes Intravenous To ShortStay Surgical 02/08/15 1418 02/09/15 1425      Assessment/Plan: Struck by falling tree ORIF of R femur and ORIF R Ulna - 02/09/2015 - T. Percell Miller. Working with PT/OT ORIF of Right ankle - 02/14/2015 - T. Percell Miller L ?4th MC avulsion - hand x-ray not impressive for significant injury Right rib fxes 2-9 R PTX-stable, CT on right chest on water seal output 250/24h and it drained further with tube manipulaiton. Cntinue for today - see how output is over 24h. IDDM HTN DVT prophylaxis - lovenox Dispo - CIR once CT out   LOS: 12 days    Georganna Skeans, MD, MPH, FACS Trauma: [phone] General Surgery: [phone]  02/21/2015

## 2015-02-21 NOTE — Progress Notes (Signed)
Rehab admissions - I am following pt's case and noted per trauma MD "CIR once chest tubes out" (possibly tomorrow). I met with pt to share that we will follow his case day by day and proceed accordingly.    I also called and updated pt's mother and brother that we are hopeful for CIR admission tomorrow pending his medical clearance from trauma.   Thanks.  Nanetta Batty, PT Rehabilitation Admissions Coordinator [phone]

## 2015-02-22 ENCOUNTER — Inpatient Hospital Stay (HOSPITAL_COMMUNITY): Payer: Self-pay

## 2015-02-22 LAB — CBC
HEMATOCRIT: 28.8 % — AB (ref 39.0–52.0)
HEMOGLOBIN: 9.3 g/dL — AB (ref 13.0–17.0)
MCH: 28.4 pg (ref 26.0–34.0)
MCHC: 32.3 g/dL (ref 30.0–36.0)
MCV: 87.8 fL (ref 78.0–100.0)
PLATELETS: 394 10*3/uL (ref 150–400)
RBC: 3.28 MIL/uL — AB (ref 4.22–5.81)
RDW: 14.7 % (ref 11.5–15.5)
WBC: 10.6 10*3/uL — AB (ref 4.0–10.5)

## 2015-02-22 LAB — GLUCOSE, CAPILLARY
GLUCOSE-CAPILLARY: 188 mg/dL — AB (ref 65–99)
Glucose-Capillary: 88 mg/dL (ref 65–99)
Glucose-Capillary: 143 mg/dL — ABNORMAL HIGH (ref 65–99)
Glucose-Capillary: 171 mg/dL — ABNORMAL HIGH (ref 65–99)
Glucose-Capillary: 119 mg/dL — ABNORMAL HIGH (ref 65–99)

## 2015-02-22 MED ORDER — LISINOPRIL 10 MG PO TABS
10.0000 mg | ORAL_TABLET | Freq: Every day | ORAL | Status: DC
  Administered 2015-02-22 – 2015-02-23 (×2): 10 mg via ORAL
  Filled 2015-02-22 (×2): qty 1

## 2015-02-22 MED ORDER — SITAGLIPTIN-METFORMIN HCL 50-1000 MG PO TABS
1.0000 | ORAL_TABLET | Freq: Two times a day (BID) | ORAL | Status: DC
Start: 2015-02-22 — End: 2015-02-22

## 2015-02-22 MED ORDER — CEFUROXIME SODIUM 1.5 G IJ SOLR
1.5000 g | Freq: Once | INTRAMUSCULAR | Status: DC
  Filled 2015-02-22: qty 1.5

## 2015-02-22 MED ORDER — METFORMIN HCL 500 MG PO TABS
1000.0000 mg | ORAL_TABLET | Freq: Two times a day (BID) | ORAL | Status: DC
Start: 2015-02-22 — End: 2015-02-23
  Administered 2015-02-22 (×2): 1000 mg via ORAL
  Filled 2015-02-22 (×2): qty 2

## 2015-02-22 MED ORDER — ENOXAPARIN SODIUM 30 MG/0.3ML ~~LOC~~ SOLN
30.0000 mg | Freq: Two times a day (BID) | SUBCUTANEOUS | Status: DC
  Administered 2015-02-22 – 2015-02-23 (×2): 30 mg via SUBCUTANEOUS
  Filled 2015-02-22 (×2): qty 0.3

## 2015-02-22 MED ORDER — LINAGLIPTIN 5 MG PO TABS
5.0000 mg | ORAL_TABLET | Freq: Two times a day (BID) | ORAL | Status: DC
  Administered 2015-02-22 (×2): 5 mg via ORAL
  Filled 2015-02-22 (×2): qty 1

## 2015-02-22 NOTE — Progress Notes (Signed)
8 Days Post-Op Procedure(s) (LRB): OPEN REDUCTION INTERNAL FIXATION (ORIF) PILON FRACTURE (Right) REMOVAL EXTERNAL FIXATION LEG (Right) Subjective: Patient d/w Dr Hulen Skains Post traumatic persistent R chest tube drainage - 250 cc/day Rec DC chest tube today and check CXR in am- I will put on schedule for R pleurx cath tomorrow if effusion recurrs. He can go to SNF with Pleurx.  Objective: Vital signs in last 24 hours: Temp:  [98.4 F (36.9 C)-99.3 F (37.4 C)] 98.4 F (36.9 C) (07/13 0640) Pulse Rate:  [71-78] 71 (07/13 0640) Cardiac Rhythm:  [-]  Resp:  [16-22] 16 (07/13 0640) BP: (133-142)/(56-61) 133/58 mmHg (07/13 0640) SpO2:  [95 %-96 %] 95 % (07/13 0640)  Hemodynamic parameters for last 24 hours:  stable  Intake/Output from previous day: 07/12 0701 - 07/13 0700 In: 3143 [P.O.:1160; I.V.:30] Out: 2425 [Urine:2125; Chest Tube:300] Intake/Output this shift:    EXAM   R chest tube in place w/o air leak  Lab Results:  Recent Labs  02/22/15 0515  WBC 10.6*  HGB 9.3*  HCT 28.8*  PLT 394   BMET: No results for input(s): NA, K, CL, CO2, GLUCOSE, BUN, CREATININE, CALCIUM in the last 72 hours.  PT/INR: No results for input(s): LABPROT, INR in the last 72 hours. ABG    Component Value Date/Time   PHART 7.387 02/15/2015 0858   HCO3 28.3* 02/15/2015 0858   TCO2 30 02/15/2015 0858   ACIDBASEDEF 4.0* 02/08/2015 1435   O2SAT 97.0 02/15/2015 0858   CBG (last 3)   Recent Labs  02/21/15 1612 02/21/15 2140 02/22/15 0643  GLUCAP 135* 102* 88    Assessment/Plan: S/P Procedure(s) (LRB): OPEN REDUCTION INTERNAL FIXATION (ORIF) PILON FRACTURE (Right) REMOVAL EXTERNAL FIXATION LEG (Right) Will discuss R pleurx cath with patient and sched  thurs   LOS: 13 days    Tharon Aquas Trigt III 02/22/2015

## 2015-02-22 NOTE — Progress Notes (Signed)
Rehab admissions - I am following pt's case and spoke with trauma PA Legrand Como this am about pt's status. Pt is not yet medically ready for CIR. Chest tube has been pulled. Pt may need pleurex chest catheter tomorrow.  I met with pt today to share this update and that we will continue to follow his case.   We will consider possible inpatient rehab admit once medically cleared by trauma.  I also called and updated pt's mother and brother. My partner, Dimple Casey will be following pt's case for the remainder of the week and she can be reached at [phone],  Thanks.  Nanetta Batty, PT Rehabilitation Admissions Coordinator [phone]

## 2015-02-22 NOTE — Progress Notes (Signed)
Nutrition Follow-up  DOCUMENTATION CODES:   Not applicable  INTERVENTION:   Continue Ensure Enlive po BID, each supplement provides 350 kcal and 20 grams of protein  Encourage adequate PO intake.   NUTRITION DIAGNOSIS:   Increased nutrient needs related to  (fractures) as evidenced by estimated needs; ongoing  GOAL:   Patient will meet greater than or equal to 90% of their needs; progressing  MONITOR:   PO intake, Supplement acceptance, Labs, I & O's  REASON FOR ASSESSMENT:   Ventilator    ASSESSMENT:   49 y.o. Male with history of HTN and DM, presents as level 1 trauma after a 1.5 ft diameter tree fell on him while he was cutting it.Intubated for airway protection. Extubated 7/7.  R rib fractures 2-9.  ORIF of R femur and ORIF R Ulna - 02/09/2015  ORIF of Right ankle - 02/14/2015  Chest tubes have been removed. Pt reports having a good appetite with no other difficulties. Meal completion has been varied from 25-100%. Pt has been consuming his Ensure. RD to continue to current orders. Plans for pleurex catheter tomorrow.   Labs and medications reviewed.   Diet Order:  Diet Carb Modified Fluid consistency:: Thin; Room service appropriate?: Yes with Assist  Skin:  Wound (see comment) (multiple incisions)  Last BM:  6/28 (enema given today)  Height:   Ht Readings from Last 1 Encounters:  02/08/15 6\' 2"  (1.88 m)    Weight:   Wt Readings from Last 1 Encounters:  02/17/15 259 lb 14.8 oz (117.9 kg)    Ideal Body Weight:  86.4 kg  Wt Readings from Last 10 Encounters:  02/17/15 259 lb 14.8 oz (117.9 kg)    BMI:  Body mass index is 33.36 kg/(m^2).  Estimated Nutritional Needs:   Kcal:  2400-2600  Protein:  120-140 grams  Fluid:  > 2.4 L/day  EDUCATION NEEDS:   No education needs identified at this time  Corrin Parker, MS, RD, LDN Pager # [phone] After hours/ weekend pager # [phone]

## 2015-02-22 NOTE — Progress Notes (Signed)
Physical Therapy Treatment Patient Details Name: Jeremy Burgess MRN: [id number] DOB: [birth year] Today's Date: 02/22/2015    History of Present Illness Pt admitted after large tree limb fell on him.  He sustained Rt displaced rib fractures 2-9, moderate Rt pneumothorax/pleural effusion - pt orally intubated, Rt CT placed; Rt femur  fracture s/p IMN ( knee and hip ROM as tolerated); Rt ankle/pilon  fracture s/p ex fix with plan for surgery 02/13/14 NWB Rt LE; Rt ulna fracture s/p ORIF WBAT through elbow, ? small liver laceration; hematoma quad/adductor muscles.  PMH includes: IDDM, HTN s .  s/p ORIF R ankle 7/6.  Extubated 7/7.    PT Comments    Able to sit edge of bed and perform sit to stand transfers to bed side commode, chair and back to bed. Patient unable to perform pivot transfer, requiring surfaces be moved behind while standing. Patient requiring max assist of 2 for mobility. Recommending further rehabilitation upon D/C.  Follow Up Recommendations  CIR     Equipment Recommendations  None recommended by PT    Recommendations for Other Services Rehab consult     Precautions / Restrictions Precautions Precautions: Fall Precaution Comments: Rt rib fractures Restrictions Weight Bearing Restrictions: Yes RUE Weight Bearing: Weight bearing as tolerated RLE Weight Bearing: Non weight bearing    Mobility  Bed Mobility Overal bed mobility: Needs Assistance Bed Mobility: Supine to Sit;Sit to Supine     Supine to sit: Max assist;+2 for physical assistance Sit to supine: Max assist;+2 for physical assistance      Transfers Overall transfer level: Needs assistance Equipment used: Right platform walker Transfers: Sit to/from Stand Sit to Stand: +2 physical assistance;Max assist;From elevated surface (cues for hand and LE placement, reminders for NWB Rt LE )         General transfer comment: bed-bedside commode-chair-bed  Ambulation/Gait                 Stairs             Wheelchair Mobility    Modified Rankin (Stroke Patients Only)       Balance Overall balance assessment: Needs assistance Sitting-balance support: Bilateral upper extremity supported Sitting balance-Leahy Scale: Poor     Standing balance support: Bilateral upper extremity supported Standing balance-Leahy Scale: Poor                      Cognition Arousal/Alertness: Awake/alert Behavior During Therapy: Flat affect Overall Cognitive Status: Within Functional Limits for tasks assessed Area of Impairment: Following commands;Safety/judgement;Awareness;Problem solving                    Exercises      General Comments        Pertinent Vitals/Pain Pain Assessment: 0-10 Pain Score: 7  Pain Location: Rt leg Pain Descriptors / Indicators: Sharp;Aching Pain Intervention(s): Monitored during session;Repositioned    Home Living                      Prior Function            PT Goals (current goals can now be found in the care plan section) Acute Rehab PT Goals Patient Stated Goal: be able to move again PT Goal Formulation: With patient Time For Goal Achievement: 02/27/15 Potential to Achieve Goals: Good Progress towards PT goals: Progressing toward goals    Frequency  Min 4X/week    PT Plan Current plan remains appropriate  Co-evaluation             End of Session Equipment Utilized During Treatment: Gait belt Activity Tolerance: Patient limited by fatigue;Patient limited by pain Patient left: in bed;with nursing/sitter in room (getting cleaned up with nursing after session)     Time: 3762-8315 PT Time Calculation (min) (ACUTE ONLY): 55 min  Charges:  $Therapeutic Activity: 53-67 mins                    G Codes:      Cassell Clement, PT, CSCS Pager [phone] Office [phone]  02/22/2015, 3:53 PM

## 2015-02-22 NOTE — Progress Notes (Signed)
     Subjective:  POD#8 ORIF R ankle and POD#11 IM nail of the R femur and ORIF R ulna.Patient reports pain as mild to moderate.  Resting comfortably in bed.  The patient didn't have any PT/OT yesterday.  Per nursing, they felt it might have been due to possible discharge.  I have reinforced the need for daily PT until a discharge order is in the computer.   Objective:   VITALS:   Filed Vitals:   02/21/15 0500 02/21/15 1300 02/21/15 2010 02/22/15 0640  BP: 128/64 142/56 136/61 133/58  Pulse: 74 78 76 71  Temp: 97.8 F (36.6 C) 99.3 F (37.4 C) 98.7 F (37.1 C) 98.4 F (36.9 C)  TempSrc: Oral  Oral Oral  Resp: 19 22 16 16   Height:      Weight:      SpO2: 97% 96% 95% 95%    Neurologically intact ABD soft Neurovascular intact Sensation intact distally Intact pulses distally Incision: dressing C/D/I R leg splinted  Lab Results  Component Value Date   WBC 10.6* 02/22/2015   HGB 9.3* 02/22/2015   HCT 28.8* 02/22/2015   MCV 87.8 02/22/2015   PLT 394 02/22/2015   BMET    Component Value Date/Time   NA 143 02/17/2015 0613   K 3.4* 02/17/2015 0613   CL 106 02/17/2015 0613   CO2 32 02/17/2015 0613   GLUCOSE 99 02/17/2015 0613   BUN 25* 02/17/2015 0613   CREATININE 0.90 02/17/2015 0613   CALCIUM 8.0* 02/17/2015 0613   GFRNONAA >60 02/17/2015 0613   GFRAA >60 02/17/2015 3976     Assessment/Plan: 8 Days Post-Op   Active Problems:   Accidentally struck by falling tree   Insulin dependent diabetes mellitus   Traumatic closed displaced fracture of multiple ribs of right side   Closed fracture of right ulna   Femur fracture, right   Ankle fracture, right   Acute blood loss anemia   Thrombocytopenia   Acute renal failure   Acute respiratory failure   Traumatic hemopneumothorax   Up with therapy NWB in the RLE, WBAT in the R elbow in order to use a platform walker. Lovenox for DVT prophylaxis Plan to discharge to inpatient rehab once chest tube is pulled.   Trauma team is monitoring this.  He will continue to need PT/OT while in the hospital until he is cleared for discharge.   Helaman Mecca Lelan Pons 02/22/2015, 7:38 AM Cell [phone]

## 2015-02-22 NOTE — Progress Notes (Signed)
Patient ID: Jeremy Burgess, male   DOB: 02-14-1966, 49 y.o.   MRN: [id number]   LOS: 13 days   Subjective: NSC   Objective: Vital signs in last 24 hours: Temp:  [98.4 F (36.9 C)-99.3 F (37.4 C)] 98.4 F (36.9 C) (07/13 0640) Pulse Rate:  [71-78] 71 (07/13 0640) Resp:  [16-22] 16 (07/13 0640) BP: (133-142)/(56-61) 133/58 mmHg (07/13 0640) SpO2:  [95 %-96 %] 95 % (07/13 0640) Last BM Date: 02/07/15   CT No air leak 318ml/24h   Laboratory  CBC  Recent Labs  02/22/15 0515  WBC 10.6*  HGB 9.3*  HCT 28.8*  PLT 394   CBG (last 3)   Recent Labs  02/21/15 1612 02/21/15 2140 02/22/15 0643  GLUCAP 135* 102* 88    Radiology Results PORTABLE CHEST - 1 VIEW  COMPARISON: February 21, 2015  FINDINGS: Chest tube remains on the right. There is a stable right apical and apicolateral pneumothorax. No tension component. Central catheter tip is in the superior cava. There are several displaced rib fractures on the right, stable. There is mild right base atelectasis. Lungs elsewhere clear. Heart is slightly enlarged with pulmonary vascularity within normal limits. No adenopathy.  IMPRESSION: Tube and catheter positions unchanged. Stable apical and apicolateral pneumothorax on the right. Stable mild right base atelectasis. Lungs otherwise clear. No change in cardiac silhouette. Displaced rib fractures on the right appear stable.   Electronically Signed  By: Lowella Grip III M.D.  On: 02/22/2015 07:11   Physical Exam General appearance: alert and no distress Resp: clear to auscultation bilaterally Cardio: regular rate and rhythm GI: normal findings: bowel sounds normal and soft, non-tender   Assessment/Plan: Struck by falling tree ORIF of R femur and ORIF R Ulna - 02/09/2015 - T. Percell Miller. Working with PT/OT ORIF of Right ankle - 02/14/2015 - T. Percell Miller L ?4th MC avulsion - hand x-ray not impressive for significant injury Right rib fxes 2-9 R PTX- D/C CT  per TTS, may need pleurex catheter if effusion develops IDDM HTN DVT prophylaxis - lovenox Dispo - CIR once CT out     Lisette Abu, PA-C Pager: [phone] General Trauma PA Pager: [phone]  02/22/2015

## 2015-02-23 ENCOUNTER — Encounter (HOSPITAL_COMMUNITY): Payer: Self-pay | Admitting: Radiology

## 2015-02-23 ENCOUNTER — Inpatient Hospital Stay (HOSPITAL_COMMUNITY)
Admission: RE | Admit: 2015-02-23 | Discharge: 2015-03-16 | DRG: 560 | Disposition: A | Discharge status: Home Health | Payer: Self-pay | Source: Intra-hospital | Attending: Physical Medicine & Rehabilitation | Admitting: Physical Medicine & Rehabilitation

## 2015-02-23 ENCOUNTER — Inpatient Hospital Stay (HOSPITAL_COMMUNITY): Payer: Self-pay

## 2015-02-23 ENCOUNTER — Encounter (HOSPITAL_COMMUNITY): Payer: Self-pay | Admitting: Anesthesiology

## 2015-02-23 ENCOUNTER — Encounter (HOSPITAL_COMMUNITY): Admission: EM | Disposition: A | Discharge status: Rehabilitation Facility | Payer: Self-pay | Source: Home / Self Care

## 2015-02-23 ENCOUNTER — Inpatient Hospital Stay (HOSPITAL_COMMUNITY): Payer: MEDICAID

## 2015-02-23 ENCOUNTER — Encounter (HOSPITAL_COMMUNITY): Payer: Self-pay | Admitting: Certified Registered Nurse Anesthetist

## 2015-02-23 DIAGNOSIS — M25569 Pain in unspecified knee: Secondary | ICD-10-CM | POA: Insufficient documentation

## 2015-02-23 DIAGNOSIS — Z791 Long term (current) use of non-steroidal anti-inflammatories (NSAID): Secondary | ICD-10-CM

## 2015-02-23 DIAGNOSIS — Z91030 Bee allergy status: Secondary | ICD-10-CM

## 2015-02-23 DIAGNOSIS — Z79899 Other long term (current) drug therapy: Secondary | ICD-10-CM

## 2015-02-23 DIAGNOSIS — R52 Pain, unspecified: Secondary | ICD-10-CM

## 2015-02-23 DIAGNOSIS — S52601A Unspecified fracture of lower end of right ulna, initial encounter for closed fracture: Secondary | ICD-10-CM

## 2015-02-23 DIAGNOSIS — S2241XD Multiple fractures of ribs, right side, subsequent encounter for fracture with routine healing: Secondary | ICD-10-CM

## 2015-02-23 DIAGNOSIS — R109 Unspecified abdominal pain: Secondary | ICD-10-CM

## 2015-02-23 DIAGNOSIS — S52201D Unspecified fracture of shaft of right ulna, subsequent encounter for closed fracture with routine healing: Secondary | ICD-10-CM

## 2015-02-23 DIAGNOSIS — M79609 Pain in unspecified limb: Secondary | ICD-10-CM | POA: Insufficient documentation

## 2015-02-23 DIAGNOSIS — S82871D Displaced pilon fracture of right tibia, subsequent encounter for closed fracture with routine healing: Secondary | ICD-10-CM

## 2015-02-23 DIAGNOSIS — K5900 Constipation, unspecified: Secondary | ICD-10-CM | POA: Diagnosis present

## 2015-02-23 DIAGNOSIS — S7291XA Unspecified fracture of right femur, initial encounter for closed fracture: Secondary | ICD-10-CM | POA: Diagnosis present

## 2015-02-23 DIAGNOSIS — D62 Acute posthemorrhagic anemia: Secondary | ICD-10-CM | POA: Diagnosis present

## 2015-02-23 DIAGNOSIS — S82871A Displaced pilon fracture of right tibia, initial encounter for closed fracture: Secondary | ICD-10-CM | POA: Diagnosis present

## 2015-02-23 DIAGNOSIS — M25562 Pain in left knee: Secondary | ICD-10-CM

## 2015-02-23 DIAGNOSIS — Z794 Long term (current) use of insulin: Secondary | ICD-10-CM

## 2015-02-23 DIAGNOSIS — S52201A Unspecified fracture of shaft of right ulna, initial encounter for closed fracture: Secondary | ICD-10-CM | POA: Diagnosis present

## 2015-02-23 DIAGNOSIS — R0602 Shortness of breath: Secondary | ICD-10-CM

## 2015-02-23 DIAGNOSIS — Z833 Family history of diabetes mellitus: Secondary | ICD-10-CM

## 2015-02-23 DIAGNOSIS — I10 Essential (primary) hypertension: Secondary | ICD-10-CM | POA: Diagnosis present

## 2015-02-23 DIAGNOSIS — S72301A Unspecified fracture of shaft of right femur, initial encounter for closed fracture: Secondary | ICD-10-CM

## 2015-02-23 DIAGNOSIS — J40 Bronchitis, not specified as acute or chronic: Secondary | ICD-10-CM | POA: Diagnosis present

## 2015-02-23 DIAGNOSIS — S72301D Unspecified fracture of shaft of right femur, subsequent encounter for closed fracture with routine healing: Principal | ICD-10-CM

## 2015-02-23 DIAGNOSIS — S2241XA Multiple fractures of ribs, right side, initial encounter for closed fracture: Secondary | ICD-10-CM | POA: Diagnosis present

## 2015-02-23 DIAGNOSIS — R1011 Right upper quadrant pain: Secondary | ICD-10-CM

## 2015-02-23 DIAGNOSIS — M79606 Pain in leg, unspecified: Secondary | ICD-10-CM

## 2015-02-23 DIAGNOSIS — E119 Type 2 diabetes mellitus without complications: Secondary | ICD-10-CM | POA: Diagnosis present

## 2015-02-23 HISTORY — DX: Displaced pilon fracture of right tibia, initial encounter for closed fracture: S82.871A

## 2015-02-23 LAB — CBC
HEMATOCRIT: 29.3 % — AB (ref 39.0–52.0)
HEMOGLOBIN: 9.4 g/dL — AB (ref 13.0–17.0)
MCH: 27.6 pg (ref 26.0–34.0)
MCHC: 32.1 g/dL (ref 30.0–36.0)
MCV: 86.2 fL (ref 78.0–100.0)
PLATELETS: 417 10*3/uL — AB (ref 150–400)
RBC: 3.4 MIL/uL — ABNORMAL LOW (ref 4.22–5.81)
RDW: 14.6 % (ref 11.5–15.5)
WBC: 10.6 10*3/uL — ABNORMAL HIGH (ref 4.0–10.5)

## 2015-02-23 LAB — CREATININE, SERUM
Creatinine, Ser: 0.93 mg/dL (ref 0.61–1.24)
GFR calc Af Amer: >60 mL/min (ref >60)

## 2015-02-23 LAB — GLUCOSE, CAPILLARY: Glucose-Capillary: 120 mg/dL — ABNORMAL HIGH (ref 65–99)

## 2015-02-23 SURGERY — INSERTION, PLEURAL DRAINAGE CATHETER
Anesthesia: Monitor Anesthesia Care | Site: Chest | Laterality: Right

## 2015-02-23 MED ORDER — LISINOPRIL 10 MG PO TABS
10.0000 mg | ORAL_TABLET | Freq: Every day | ORAL | Status: DC
  Administered 2015-02-24 – 2015-03-16 (×21): 10 mg via ORAL
  Filled 2015-02-23 (×24): qty 1

## 2015-02-23 MED ORDER — SODIUM CHLORIDE 0.9 % IJ SOLN
INTRAMUSCULAR | Status: AC
  Filled 2015-02-23: qty 10

## 2015-02-23 MED ORDER — PROPOFOL 10 MG/ML IV BOLUS
INTRAVENOUS | Status: AC
  Filled 2015-02-23: qty 20

## 2015-02-23 MED ORDER — ACETAMINOPHEN 325 MG PO TABS: 325.0000 mg | ORAL_TABLET | ORAL | Status: DC | PRN

## 2015-02-23 MED ORDER — ADULT MULTIVITAMIN W/MINERALS CH
1.0000 | ORAL_TABLET | Freq: Every day | ORAL | Status: DC
  Administered 2015-02-24 – 2015-03-16 (×21): 1 via ORAL
  Filled 2015-02-23 (×25): qty 1

## 2015-02-23 MED ORDER — INSULIN DETEMIR 100 UNIT/ML ~~LOC~~ SOLN
10.0000 [IU] | Freq: Two times a day (BID) | SUBCUTANEOUS | Status: DC
Start: 2015-02-23 — End: 2015-03-16
  Administered 2015-02-23 – 2015-03-16 (×42): 10 [IU] via SUBCUTANEOUS
  Filled 2015-02-23 (×48): qty 0.1

## 2015-02-23 MED ORDER — LIDOCAINE HCL (CARDIAC) 20 MG/ML IV SOLN
INTRAVENOUS | Status: AC
  Filled 2015-02-23: qty 5

## 2015-02-23 MED ORDER — POLYETHYLENE GLYCOL 3350 17 G PO PACK
17.0000 g | PACK | Freq: Every day | ORAL | Status: DC
  Administered 2015-02-23 – 2015-02-25 (×3): 17 g via ORAL
  Filled 2015-02-23 (×4): qty 1

## 2015-02-23 MED ORDER — ROCURONIUM BROMIDE 50 MG/5ML IV SOLN
INTRAVENOUS | Status: AC
  Filled 2015-02-23: qty 1

## 2015-02-23 MED ORDER — SODIUM CHLORIDE 0.9 % IJ SOLN
10.0000 mL | INTRAMUSCULAR | Status: DC | PRN
  Administered 2015-02-24: 30 mL
  Administered 2015-02-26 (×3): 10 mL
  Filled 2015-02-23 (×4): qty 40

## 2015-02-23 MED ORDER — ENOXAPARIN SODIUM 30 MG/0.3ML ~~LOC~~ SOLN
30.0000 mg | Freq: Two times a day (BID) | SUBCUTANEOUS | Status: DC
  Administered 2015-02-24 – 2015-02-26 (×6): 30 mg via SUBCUTANEOUS
  Filled 2015-02-23 (×8): qty 0.3

## 2015-02-23 MED ORDER — METFORMIN HCL 500 MG PO TABS
1000.0000 mg | ORAL_TABLET | Freq: Two times a day (BID) | ORAL | Status: DC
  Administered 2015-02-25 – 2015-03-16 (×39): 1000 mg via ORAL
  Filled 2015-02-23 (×45): qty 2

## 2015-02-23 MED ORDER — MAGNESIUM CITRATE PO SOLN
1.0000 | Freq: Once | ORAL | Status: AC
  Administered 2015-02-23: 1 via ORAL
  Filled 2015-02-23: qty 296

## 2015-02-23 MED ORDER — ONDANSETRON HCL 4 MG PO TABS
4.0000 mg | ORAL_TABLET | Freq: Four times a day (QID) | ORAL | Status: DC | PRN
  Administered 2015-02-24 – 2015-03-15 (×3): 4 mg via ORAL
  Filled 2015-02-23 (×3): qty 1

## 2015-02-23 MED ORDER — MIDAZOLAM HCL 2 MG/2ML IJ SOLN
INTRAMUSCULAR | Status: AC
  Filled 2015-02-23: qty 2

## 2015-02-23 MED ORDER — OXYCODONE HCL 5 MG PO TABS
5.0000 mg | ORAL_TABLET | ORAL | Status: DC | PRN
  Administered 2015-02-23 – 2015-03-02 (×29): 10 mg via ORAL
  Filled 2015-02-23 (×30): qty 2

## 2015-02-23 MED ORDER — INSULIN ASPART 100 UNIT/ML ~~LOC~~ SOLN
0.0000 [IU] | Freq: Three times a day (TID) | SUBCUTANEOUS | Status: DC
  Administered 2015-02-24 (×2): 3 [IU] via SUBCUTANEOUS
  Administered 2015-02-24: 4 [IU] via SUBCUTANEOUS
  Administered 2015-02-25 – 2015-02-26 (×3): 3 [IU] via SUBCUTANEOUS
  Administered 2015-02-26 – 2015-02-28 (×3): 4 [IU] via SUBCUTANEOUS
  Administered 2015-02-28 – 2015-03-02 (×4): 3 [IU] via SUBCUTANEOUS
  Administered 2015-03-03 (×2): 4 [IU] via SUBCUTANEOUS
  Administered 2015-03-04 – 2015-03-05 (×2): 3 [IU] via SUBCUTANEOUS
  Administered 2015-03-06: 4 [IU] via SUBCUTANEOUS
  Administered 2015-03-07 – 2015-03-13 (×3): 3 [IU] via SUBCUTANEOUS
  Administered 2015-03-14: 4 [IU] via SUBCUTANEOUS
  Administered 2015-03-14: 3 [IU] via SUBCUTANEOUS
  Administered 2015-03-14: 4 [IU] via SUBCUTANEOUS
  Administered 2015-03-15: 3 [IU] via SUBCUTANEOUS

## 2015-02-23 MED ORDER — ENOXAPARIN SODIUM 30 MG/0.3ML ~~LOC~~ SOLN: 30.0000 mg | Freq: Two times a day (BID) | SUBCUTANEOUS | Status: DC

## 2015-02-23 MED ORDER — EPHEDRINE SULFATE 50 MG/ML IJ SOLN
INTRAMUSCULAR | Status: AC
  Filled 2015-02-23: qty 1

## 2015-02-23 MED ORDER — ONDANSETRON HCL 4 MG/2ML IJ SOLN
INTRAMUSCULAR | Status: AC
  Filled 2015-02-23: qty 2

## 2015-02-23 MED ORDER — LINAGLIPTIN 5 MG PO TABS
5.0000 mg | ORAL_TABLET | Freq: Two times a day (BID) | ORAL | Status: DC
  Administered 2015-02-24 – 2015-03-16 (×41): 5 mg via ORAL
  Filled 2015-02-23 (×48): qty 1

## 2015-02-23 MED ORDER — IPRATROPIUM-ALBUTEROL 0.5-2.5 (3) MG/3ML IN SOLN: 3.0000 mL | RESPIRATORY_TRACT | Status: DC | PRN

## 2015-02-23 MED ORDER — SORBITOL 70 % SOLN: 30.0000 mL | Freq: Every day | Status: DC | PRN

## 2015-02-23 MED ORDER — IOHEXOL 300 MG/ML  SOLN
100.0000 mL | Freq: Once | INTRAMUSCULAR | Status: AC | PRN
  Administered 2015-02-23: 100 mL via INTRAVENOUS

## 2015-02-23 MED ORDER — SODIUM CHLORIDE 0.9 % IJ SOLN
10.0000 mL | Freq: Two times a day (BID) | INTRAMUSCULAR | Status: DC
  Administered 2015-02-25 – 2015-02-26 (×2): 30 mL

## 2015-02-23 MED ORDER — ONDANSETRON HCL 4 MG/2ML IJ SOLN: 4.0000 mg | Freq: Four times a day (QID) | INTRAMUSCULAR | Status: DC | PRN

## 2015-02-23 MED ORDER — LIDOCAINE HCL (PF) 1 % IJ SOLN
INTRAMUSCULAR | Status: AC
  Filled 2015-02-23: qty 30

## 2015-02-23 MED ORDER — FENTANYL CITRATE (PF) 250 MCG/5ML IJ SOLN
INTRAMUSCULAR | Status: AC
  Filled 2015-02-23: qty 5

## 2015-02-23 MED ORDER — IOHEXOL 300 MG/ML  SOLN: 25.0000 mL | INTRAMUSCULAR | Status: DC

## 2015-02-23 MED ORDER — SUCCINYLCHOLINE CHLORIDE 20 MG/ML IJ SOLN
INTRAMUSCULAR | Status: AC
  Filled 2015-02-23: qty 1

## 2015-02-23 MED ORDER — DOCUSATE SODIUM 100 MG PO CAPS
100.0000 mg | ORAL_CAPSULE | Freq: Two times a day (BID) | ORAL | Status: DC
  Administered 2015-02-24 – 2015-02-26 (×5): 100 mg via ORAL
  Filled 2015-02-23 (×10): qty 1

## 2015-02-23 NOTE — Progress Notes (Signed)
Patient ID: Jeremy Burgess, male   DOB: Feb 27, 1966, 49 y.o.   MRN: [id number]   LOS: 14 days   Subjective: Doing well, inquires about possible fluid collection on right abdomen.   Objective: Vital signs in last 24 hours: Temp:  [98.3 F (36.8 C)-98.7 F (37.1 C)] 98.4 F (36.9 C) (07/14 0536) Pulse Rate:  [72-81] 72 (07/14 0536) Resp:  [16-18] 16 (07/14 0536) BP: (110-123)/(54-60) 123/60 mmHg (07/14 0536) SpO2:  [94 %-97 %] 95 % (07/14 0536) Last BM Date: 02/07/15   Radiology Results PORTABLE CHEST - 1 VIEW  COMPARISON: 02/22/2015.  FINDINGS: Interim removal of right chest tube. Left PICC line in stable position. Mediastinum hilar structures normal. Cardiomegaly with normal pulmonary vascularity. Low lung volumes with bibasilar subsegmental atelectasis. Stable right apical tiny pneumothorax. Displaced right rib fractures again noted .  IMPRESSION: 1. Interim removal of right chest tube. Stable tiny right apical pneumothorax. 2. Left PICC line stable position. 3. Low lung volumes with bibasilar atelectasis. 4. Stable cardiomegaly. 5. Displaced right rib fractures again noted.   Electronically Signed  By: Marcello Moores Register  On: 02/23/2015 07:18   Physical Exam General appearance: alert and no distress Resp: clear to auscultation bilaterally Cardio: regular rate and rhythm GI: Soft, +BS, fullness over right lower abd/pannus   Assessment/Plan: Struck by falling tree ORIF of R femur and ORIF R Ulna - 02/09/2015 - T. Percell Miller. Working with PT/OT ORIF of Right ankle - 02/14/2015 - T. Percell Miller L ?4th MC avulsion - hand x-ray not impressive for significant injury Right rib fxes 2-9 R PTX- No effusion on CXR today, Pleurex catheter cancelled. Right abd swelling -- I suspect he has seroma/hematoma under abrasions there but can't r/o hernia. Will get CT. IDDM HTN DVT prophylaxis - lovenox Dispo - Hopefully can go to CIR today    Lisette Abu, PA-C Pager:  [phone] General Trauma PA Pager: [phone]  02/23/2015

## 2015-02-23 NOTE — Progress Notes (Signed)
Meredith Staggers, MD Physician Signed Physical Medicine and Rehabilitation Consult Note 02/17/2015 8:34 AM  Related encounter: ED to Hosp-Admission (Current) from 02/08/2015 in [place] Collapse All        Physical Medicine and Rehabilitation Consult  Reason for Consult: Multitrauma Referring Physician: Dr. Hulen Skains.    HPI: Jeremy Burgess is a 49 y.o. male with history of HTN, DM- insulin dependent, who was admitted on 02/08/15 after the tree that he was cutting fell on him with complaints of right torso, abdomen and hip pain. Work up revealed right displaced 2-9th rib fractures, moderate right PTX, right femoral shaft fracture, right pilon fracture and right ulna fracture. Patient developed hypotensive shock in ED with decreased LOC requiring intubation as well as pressors. Right chest tube placed for treatment of R-PTX and he was treated with 4 units PRBC and FFP. He was taken to OR for IM nail right femur, ORIF right ulna and external fixation of right ankle by Dr. Percell Miller. 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 No surgical intervention needed for rib fractures per Dr. Prescott Gum. He underwent ORIF right pilon fracture on Dr. Percell Miller on 07/05 and tolerated vent wean on 02/16/15. Therapy ongoing and CIR recommended for follow up therapy.   Review of Systems  HENT: Negative for hearing loss.  Eyes: Negative for blurred vision and double vision.  Respiratory: Negative for cough, shortness of breath and wheezing.  Cardiovascular: Positive for chest pain (right chest wall pain). Negative for palpitations.  Gastrointestinal: Negative for heartburn, nausea, abdominal pain and constipation.  Genitourinary: Negative for dysuria and urgency.  Musculoskeletal: Positive for joint pain. Negative for back pain and neck pain.  Neurological: Positive for weakness. Negative for dizziness and headaches.    Psychiatric/Behavioral: The patient is not nervous/anxious and does not have insomnia.     Past Medical History  Diagnosis Date  . Diabetes mellitus   . Hypertension     Past Surgical History  Procedure Laterality Date  . Femur im nail Right 02/09/2015    Procedure: INTRAMEDULLARY (IM) RETROGRADE FEMORAL NAILING; Surgeon: [name], MD; Location: [facility]; Service: Orthopedics; Laterality: Right;  . External fixation leg Right 02/09/2015    Procedure: EXTERNAL FIXATION LEG; Surgeon: [name], MD; Location: [facility]; Service: Orthopedics; Laterality: Right;  . Orif ulnar fracture Right 02/09/2015    Procedure: OPEN REDUCTION INTERNAL FIXATION (ORIF) ULNAR FRACTURE; Surgeon: [name], MD; Location: [facility]; Service: Orthopedics; Laterality: Right;  . Orif ankle fracture Right 02/14/2015    Procedure: OPEN REDUCTION INTERNAL FIXATION (ORIF) PILON FRACTURE; Surgeon: [name], MD; Location: [facility] [place]; Service: Orthopedics; Laterality: Right;  . External fixation removal Right 02/14/2015    Procedure: REMOVAL EXTERNAL FIXATION LEG; Surgeon: [name], MD; Location: [facility]; Service: Orthopedics; Laterality: Right;  . Back surgery       Family History  Problem Relation Age of Onset  . Diabetes Mother   . COPD Mother       Social History: Lives with mother (disabled but independent and can provide supervision past discharge). 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 .    Allergies  Allergen Reactions  . Bee Venom Other (See Comments)    intolerance    Medications Prior to Admission  Medication Sig Dispense Refill  . insulin detemir (LEVEMIR) 100 UNIT/ML injection Inject 10 Units into the  skin at bedtime.    [name] lisinopril (PRINIVIL,ZESTRIL) 10 MG tablet Take 10 mg by mouth daily.    . naproxen sodium (ANAPROX) 220 MG  tablet Take 220 mg by mouth daily as needed (for pain).    [name] sitaGLIPtin-metformin (JANUMET) 50-1000 MG per tablet Take 1 tablet by mouth 2 (two) times daily with a meal.      Home: Home Living Family/patient expects to be discharged to:: Private residence Living Arrangements: Other relatives (brother and sister in [place] [employer]) Available Help at Discharge: Family, Available 24 hours/day, Available PRN/intermittently Type of Home: House Home Access: Stairs to enter [employer] of Steps: 3 Home Layout: One level Home Equipment: None Additional Comments: Plan is for pt to go to brother's at discharge. They plan to build a ramp if necessary   Functional History: Prior Function Level of Independence: Independent Comments: Pt works in [employer]. Fully independent, drives  Functional Status:  Mobility: Bed Mobility Overal bed mobility: Needs Assistance Bed Mobility: Supine to Sit, Sit to Supine Supine to sit: Mod assist, +2 for physical assistance Sit to supine: Max assist, +2 for physical assistance General bed mobility comments: pt assisted with bridging and moving both LE's, needed truncal assist, but assisted with w/shift and asymmetrical scoot. Transfers General transfer comment: unable       ADL: ADL Overall ADL's : Needs assistance/impaired Eating/Feeding: NPO Grooming: Wash/dry hands, Wash/dry face, Set up, Bed level Upper Body Bathing: Moderate assistance, Bed level Lower Body Bathing: Total assistance, Bed level, +2 for physical assistance Upper Body Dressing : Total assistance, Bed level Lower Body Dressing: Total assistance, Bed level, +2 for physical assistance Toilet Transfer: Total assistance Toilet Transfer Details (indicate cue type and reason): unable  Toileting- Clothing Manipulation and Hygiene: Total assistance, Bed level, +2 for physical assistance Functional mobility during ADLs: +2 for physical assistance, Total assistance General  ADL Comments: Pt limited by pain   Cognition: Cognition Overall Cognitive Status: Difficult to assess Orientation Level: Oriented X4 Cognition Arousal/Alertness: Awake/alert Behavior During Therapy: Flat affect, Anxious Overall Cognitive Status: Difficult to assess Difficult to assess due to: Intubated    Blood pressure 159/63, pulse 75, temperature 99.7 F (37.6 C), temperature source Core (Comment), resp. rate 21, height 6\' 2"  (1.88 m), weight 117.9 kg (259 lb 14.8 oz), SpO2 99 %. Physical Exam  Nursing note and vitals reviewed. Constitutional: He is oriented to person, place, and time. He appears well-developed and well-nourished.  HENT:  Head: Normocephalic and atraumatic.  Eyes: Conjunctivae are normal. Pupils are equal, round, and reactive to light.  Neck: Normal range of motion. Neck supple. No tracheal deviation present. Thyromegaly present.  Cardiovascular: Normal rate and regular rhythm.  Respiratory: Effort normal. He has no wheezes. He has rhonchi in the right middle field and the right lower field. He has no rales. He exhibits tenderness.  GI: Soft. Bowel sounds are normal. He exhibits no distension. There is no tenderness.  Musculoskeletal: He exhibits edema (right hand and left foot. ) and tenderness.  Keeps RLE extended outward and right ankle with compressive dressing. R hand with splint in place and sensation intact.  Neurological: He is alert and oriented to person, place, and time.  Speech clear. Alert and appropriate. Has reasonable insight and awareness. Follows basic commands without difficulty. RUE/RLE limited due to pain. Moves LUE/LLE without difficulty.  Skin: Skin is warm and dry.  Psychiatric: His speech is normal and behavior is normal. Thought content normal. Cognition and memory are not impaired. He  does not express inappropriate judgment.     Lab Results Last 24 Hours    Results for orders placed or performed during the hospital encounter of  02/08/15 (from the past 24 hour(s))  Glucose, capillary Status: Abnormal   Collection Time: 02/16/15 12:25 PM  Result Value Ref Range   Glucose-Capillary 140 (H) 65 - 99 mg/dL   Comment 1 Notify RN    Comment 2 Document in Chart   Glucose, capillary Status: Abnormal   Collection Time: 02/16/15 4:35 PM  Result Value Ref Range   Glucose-Capillary 110 (H) 65 - 99 mg/dL   Comment 1 Notify RN    Comment 2 Document in Chart   Glucose, capillary Status: Abnormal   Collection Time: 02/16/15 7:52 PM  Result Value Ref Range   Glucose-Capillary 117 (H) 65 - 99 mg/dL  Glucose, capillary Status: Abnormal   Collection Time: 02/16/15 11:17 PM  Result Value Ref Range   Glucose-Capillary 103 (H) 65 - 99 mg/dL  Glucose, capillary Status: Abnormal   Collection Time: 02/17/15 3:15 AM  Result Value Ref Range   Glucose-Capillary 101 (H) 65 - 99 mg/dL  Basic metabolic panel Status: Abnormal   Collection Time: 02/17/15 6:13 AM  Result Value Ref Range   Sodium 143 135 - 145 mmol/L   Potassium 3.4 (L) 3.5 - 5.1 mmol/L   Chloride 106 101 - 111 mmol/L   CO2 32 22 - 32 mmol/L   Glucose, Bld 99 65 - 99 mg/dL   BUN 25 (H) 6 - 20 mg/dL   Creatinine, Ser 0.90 0.61 - 1.24 mg/dL   Calcium 8.0 (L) 8.9 - 10.3 mg/dL   GFR calc non Af Amer >60 >60 mL/min   GFR calc Af Amer >60 >60 mL/min   Anion gap 5 5 - 15  CBC with Differential/Platelet Status: Abnormal   Collection Time: 02/17/15 6:13 AM  Result Value Ref Range   WBC 11.9 (H) 4.0 - 10.5 K/uL   RBC 3.14 (L) 4.22 - 5.81 MIL/uL   Hemoglobin 8.8 (L) 13.0 - 17.0 g/dL   HCT 27.1 (L) 39.0 - 52.0 %   MCV 86.3 78.0 - 100.0 fL   MCH 28.0 26.0 - 34.0 pg   MCHC 32.5 30.0 - 36.0 g/dL   RDW 14.8 11.5 - 15.5 %   Platelets 214 150 - 400 K/uL   Neutrophils Relative % 80 (H)  43 - 77 %   Neutro Abs 9.4 (H) 1.7 - 7.7 K/uL   Lymphocytes Relative 10 (L) 12 - 46 %   Lymphs Abs 1.2 0.7 - 4.0 K/uL   Monocytes Relative 8 3 - 12 %   Monocytes Absolute 0.9 0.1 - 1.0 K/uL   Eosinophils Relative 2 0 - 5 %   Eosinophils Absolute 0.3 0.0 - 0.7 K/uL   Basophils Relative 0 0 - 1 %   Basophils Absolute 0.0 0.0 - 0.1 K/uL  Glucose, capillary Status: None   Collection Time: 02/17/15 8:26 AM  Result Value Ref Range   Glucose-Capillary 99 65 - 99 mg/dL   Comment 1 Notify RN    Comment 2 Document in Chart       Imaging Results (Last 48 hours)    Dg Chest Port 1 View  02/17/2015 CLINICAL DATA: Chest trauma with multiple right rib fractures. EXAM: PORTABLE CHEST - 1 VIEW COMPARISON: 02/16/2015 and 02/15/2015 FINDINGS: Right chest tube in place. Multiple displaced right rib fractures again noted. No pneumothorax. Lungs are clear except for minimal atelectasis at  the left lung base. PICC appears in good position. NG tube and endotracheal tube have been removed. Heart size and vascularity are normal. IMPRESSION: Minimal atelectasis at the left base. No pneumothorax. Electronically Signed By: Lorriane Shire M.D. On: 02/17/2015 07:53   Dg Chest Port 1 View  02/16/2015 CLINICAL DATA: Chest trauma EXAM: PORTABLE CHEST - 1 VIEW COMPARISON: Portable exam 0547 hours compared to 02/15/2015 FINDINGS: Tip of endotracheal tube at clavicular heads projecting 7.8 cm above carina. Nasogastric tube extends into stomach. LEFT arm PICC line with tip projecting over SVC. RIGHT thoracostomy tube unchanged. Normal heart size and mediastinal contours. Bibasilar atelectasis greater on RIGHT. No infiltrate, pleural effusion or pneumothorax. IMPRESSION: Line and tube positions as above. Bibasilar atelectasis greater on the RIGHT. Electronically Signed By: Lavonia Dana M.D. On: 02/16/2015 08:03      Assessment/Plan: Diagnosis: polytrauma after tree accident 1. Does the need for close, 24 hr/day medical supervision in concert with the patient's rehab needs make it unreasonable for this patient to be served in a less intensive setting? Yes 2. Co-Morbidities requiring supervision/potential complications: pain, dm, wound care 3. Due to bladder management, bowel management, safety, skin/wound care, disease management, medication administration, pain management and patient education, does the patient require 24 hr/day rehab nursing? Yes 4. Does the patient require coordinated care of a physician, rehab nurse, PT (1-2 hrs/day, 5 days/week) and OT (1-2 hrs/day, 5 days/week) to address physical and functional deficits in the context of the above medical diagnosis(es)? Yes Addressing deficits in the following areas: balance, endurance, locomotion, strength, transferring, bowel/bladder control, bathing, dressing, feeding, grooming, toileting and psychosocial support 5. Can the patient actively participate in an intensive therapy program of at least 3 hrs of therapy per day at least 5 days per week? Yes 6. The potential for patient to make measurable gains while on inpatient rehab is excellent 7. Anticipated functional outcomes upon discharge from inpatient rehab are modified independent and supervision with PT, modified independent and supervision with OT, n/a with SLP. 8. Estimated rehab length of stay to reach the above functional goals is: 9-13 days 9. Does the patient have adequate social supports and living environment to accommodate these discharge functional goals? Yes and Potentially 10. Anticipated D/C setting: Home 11. Anticipated post D/C treatments: [place] therapy 12. Overall Rehab/Functional Prognosis: excellent  RECOMMENDATIONS: This patient's condition is appropriate for continued rehabilitative care in the following setting: CIR Patient has agreed to participate in recommended program.  Yes Note that insurance prior authorization may be required for reimbursement for recommended care.  Comment: Rehab Admissions Coordinator to follow up.  Thanks,  Meredith Staggers, MD, Mellody Drown     02/17/2015       Revision History     Date/Time User Provider Type Action   02/17/2015 11:07 AM Meredith Staggers, MD Physician Sign   02/17/2015 9:23 AM Bary Leriche, PA-C Physician Assistant [place]   View Details Report       Routing History     Date/Time From To Method   02/17/2015 11:07 AM Meredith Staggers, MD Meredith Staggers, MD In Basket

## 2015-02-23 NOTE — Progress Notes (Signed)
Rehab admissions - I spoke with trauma PA, Legrand Como, and patient is medically ready for rehab today.  Bed available on inpatient rehab and will admit to inpatient rehab today.  Call me for questions.  #[phone]

## 2015-02-23 NOTE — Anesthesia Preprocedure Evaluation (Signed)
Anesthesia Evaluation  Patient identified by MRN, date of birth, ID band Patient awake    Reviewed: Allergy & Precautions, NPO status , Patient's Chart, lab work & pertinent test results  Airway Mallampati: II   Neck ROM: full    Dental   Pulmonary neg pulmonary ROS,  breath sounds clear to auscultation        Cardiovascular hypertension, Rhythm:regular Rate:Normal     Neuro/Psych    GI/Hepatic   Endo/Other  diabetes, Type 2, Insulin Dependentobese  Renal/GU      Musculoskeletal   Abdominal   Peds  Hematology   Anesthesia Other Findings   Reproductive/Obstetrics                             Anesthesia Physical Anesthesia Plan  ASA: II  Anesthesia Plan: MAC   Post-op Pain Management:    Induction: Intravenous  Airway Management Planned: Simple Face Mask  Additional Equipment:   Intra-op Plan:   Post-operative Plan:   Informed Consent: I have reviewed the patients History and Physical, chart, labs and discussed the procedure including the risks, benefits and alternatives for the proposed anesthesia with the patient or authorized representative who has indicated his/her understanding and acceptance.     Plan Discussed with: CRNA, Anesthesiologist and Surgeon  Anesthesia Plan Comments:         Anesthesia Quick Evaluation

## 2015-02-23 NOTE — Progress Notes (Signed)
     Subjective:  POD#9 ORIF R ankle and POD#12 IM nail of the R femur and ORIF R ulna.  Patient reports pain as mild to moderate.  Resting comfortably in bed. No pleurx cath was needed due to CXR findings this morning.  CT of abdomen pending but if negative, trauma feels may be ready for transfer to CIR today.   Objective:   VITALS:   Filed Vitals:   02/22/15 0640 02/22/15 1400 02/22/15 2300 02/23/15 0536  BP: 133/58 111/55 110/54 123/60  Pulse: 71 81 76 72  Temp: 98.4 F (36.9 C) 98.3 F (36.8 C) 98.7 F (37.1 C) 98.4 F (36.9 C)  TempSrc: Oral  Oral Oral  Resp: 16 18 16 16   Height:      Weight:      SpO2: 95% 94% 97% 95%    Neurologically intact ABD soft Neurovascular intact Sensation intact distally Intact pulses distally Incision: dressing C/D/I R leg splinted  Lab Results  Component Value Date   WBC 10.6* 02/22/2015   HGB 9.3* 02/22/2015   HCT 28.8* 02/22/2015   MCV 87.8 02/22/2015   PLT 394 02/22/2015   BMET    Component Value Date/Time   NA 143 02/17/2015 0613   K 3.4* 02/17/2015 0613   CL 106 02/17/2015 0613   CO2 32 02/17/2015 0613   GLUCOSE 99 02/17/2015 0613   BUN 25* 02/17/2015 0613   CREATININE 0.90 02/17/2015 0613   CALCIUM 8.0* 02/17/2015 0613   GFRNONAA >60 02/17/2015 0613   GFRAA >60 02/17/2015 4627     Assessment/Plan: Day of Surgery   Active Problems:   Accidentally struck by falling tree   Insulin dependent diabetes mellitus   Traumatic closed displaced fracture of multiple ribs of right side   Closed fracture of right ulna   Femur fracture, right   Ankle fracture, right   Acute blood loss anemia   Thrombocytopenia   Acute renal failure   Acute respiratory failure   Traumatic hemopneumothorax   Up with therapy NWB in the RLE, WBAT in the R elbow in order to use a platform walker. Lovenox for DVT prophylaxis Plan to discharge to inpatient rehab possibly today. He will continue to need PT/OT while in the hospital until  he is cleared for discharge. Dr. Percell Miller and I will sign off and continue to follow as outpatient at this time.  Please call with any questions or concerns.    Treon Kehl Lelan Pons 02/23/2015, 9:32 AM Cell [phone]

## 2015-02-23 NOTE — H&P (Signed)
Physical Medicine and Rehabilitation Admission H&P   Chief Complaint  Patient presents with  . Trauma  : HPI: Jeremy Burgess is a 49 y.o. male with history of HTN, DM- insulin dependent, who was admitted on 02/08/15 after the tree that he was cutting fell on him with complaints of right torso, abdomen and hip pain. Work up revealed right displaced 2-9th rib fractures, moderate right PTX, right femoral shaft fracture, right pilon fracture and right ulna fracture. Patient developed hypotensive shock in ED with decreased LOC requiring intubation as well as pressors. Right chest tube placed for treatment of R-PTX and he was treated with 4 units PRBC and FFP. He was taken to OR for IM nail right femur, ORIF right ulna and external fixation of right ankle by Dr. Percell Miller. Has had fevers due to staph tracheobronchitis and was initially started on Vanc/Zosyn for treatment and changed to Ancef. No surgical intervention needed for rib fractures per Dr. Prescott Gum. He underwent ORIF right pilon fracture on Dr. Percell Miller on 07/05 and tolerated vent wean on 02/16/15. His right chest tube was removed 02/21/2015 without incident. Noted subtle right abdominal swelling question seroma CT of abdomen pending. Trauma services at this time has advise conservative care. Patient is weightbearing as tolerated right upper extremity and nonweightbearing right lower extremity. Subcutaneous Lovenox for DVT prophylaxis. Therapy ongoing and CIR recommended for follow up therapy. Patient was admitted for a comprehensive rehabilitation program  Patient denies any discomfort in the right lower extremity right upper extremity or left side. His chief pain complaint is right lower abdominal. He feels it would be better if he could have a bowel movement. States his last bowel movement was 16 days ago  ROS Review of Systems  HENT: Negative for hearing loss.  Eyes: Negative for blurred vision and double vision.  Respiratory:  Negative for cough, shortness of breath and wheezing.  Cardiovascular: Positive for chest pain (right chest wall pain). Negative for palpitations.  Gastrointestinal: Negative for heartburn, nausea, abdominal pain and constipation.  Genitourinary: Negative for dysuria and urgency.  Musculoskeletal: Positive for joint pain. Negative for back pain and neck pain.  Neurological: Positive for weakness. Negative for dizziness and headaches.  Psychiatric/Behavioral: The patient is not nervous/anxious and does not have insomnia    Past Medical History  Diagnosis Date  . Diabetes mellitus   . Hypertension   . Smoker 0    denies smoking   Past Surgical History  Procedure Laterality Date  . Femur im nail Right 02/09/2015    Procedure: INTRAMEDULLARY (IM) RETROGRADE FEMORAL NAILING; Surgeon: [name], MD; Location: [place]; Service: Orthopedics; Laterality: Right;  . External fixation leg Right 02/09/2015    Procedure: EXTERNAL FIXATION LEG; Surgeon: [name], MD; Location: [place]; Service: Orthopedics; Laterality: Right;  . Orif ulnar fracture Right 02/09/2015    Procedure: OPEN REDUCTION INTERNAL FIXATION (ORIF) ULNAR FRACTURE; Surgeon: [name], MD; Location: [place]; Service: Orthopedics; Laterality: Right;  . Orif ankle fracture Right 02/14/2015    Procedure: OPEN REDUCTION INTERNAL FIXATION (ORIF) PILON FRACTURE; Surgeon: [name], MD; Location: [place]; Service: Orthopedics; Laterality: Right;  . External fixation removal Right 02/14/2015    Procedure: REMOVAL EXTERNAL FIXATION LEG; Surgeon: [name], MD; Location: [place]; Service: Orthopedics; Laterality: Right;  . Back surgery     Family History  Problem Relation Age of Onset  . Diabetes Mother   . COPD Mother    Social History:  has no tobacco, alcohol,  and drug history on file. Allergies:  Allergies   Allergen Reactions  . Bee Venom Other (See Comments)    intolerance   Medications Prior to Admission  Medication Sig Dispense Refill  . insulin detemir (LEVEMIR) 100 UNIT/ML injection Inject 10 Units into the skin at bedtime.    [name] lisinopril (PRINIVIL,ZESTRIL) 10 MG tablet Take 10 mg by mouth daily.    . naproxen sodium (ANAPROX) 220 MG tablet Take 220 mg by mouth daily as needed (for pain).    [name] sitaGLIPtin-metformin (JANUMET) 50-1000 MG per tablet Take 1 tablet by mouth 2 (two) times daily with a meal.      Home: Home Living Family/patient expects to be discharged to:: Private residence Living Arrangements: Other relatives (brother and sister in [place]) Available Help at Discharge: Family, Available 24 hours/day, Available PRN/intermittently Type of Home: House Home Access: Stairs to enter [employer] of Steps: 3 Home Layout: One level Home Equipment: None Additional Comments: Plan is for pt to go to brother's at discharge. They plan to build a ramp if necessary   Functional History: Prior Function Level of Independence: Independent Comments: Pt works in [employer]. Fully independent, drives   Functional Status:  Mobility: Bed Mobility Overal bed mobility: Needs Assistance Bed Mobility: Supine to Sit, Sit to Supine Supine to sit: Mod assist, +2 for physical assistance Sit to supine: Max assist, +2 for physical assistance General bed mobility comments: pt assisted with bridging and moving both LE's, needed truncal assist, but assisted with w/shift and asymmetrical scoot. Transfers General transfer comment: unable       ADL: ADL Overall ADL's : Needs assistance/impaired Eating/Feeding: NPO Grooming: Wash/dry hands, Wash/dry face, Set up, Bed level Upper Body Bathing: Moderate assistance, Bed level Lower Body Bathing: Total assistance, Bed level, +2 for physical assistance Upper Body Dressing : Total assistance, Bed  level Lower Body Dressing: Total assistance, Bed level, +2 for physical assistance Toilet Transfer: Total assistance Toilet Transfer Details (indicate cue type and reason): unable  Toileting- Clothing Manipulation and Hygiene: Total assistance, Bed level, +2 for physical assistance Functional mobility during ADLs: +2 for physical assistance, Total assistance General ADL Comments: Pt limited by pain   Cognition: Cognition Overall Cognitive Status: Impaired/Different from baseline Orientation Level: Oriented X4 Cognition Arousal/Alertness: Awake/alert Behavior During Therapy: Flat affect, Anxious Overall Cognitive Status: Impaired/Different from baseline Area of Impairment: Following commands, Safety/judgement, Awareness, Problem solving Following Commands: Follows one step commands inconsistently, Follows one step commands with increased time Safety/Judgement: Decreased awareness of safety, Decreased awareness of deficits Awareness: Intellectual Problem Solving: Difficulty sequencing, Requires verbal cues, Requires tactile cues, Slow processing Difficult to assess due to: Intubated  Physical Exam: Blood pressure 131/59, pulse 70, temperature 98.3 F (36.8 C), temperature source Oral, resp. rate 17, height 6\' 2"  (1.88 m), weight 117.9 kg (259 lb 14.8 oz), SpO2 93 %. Physical Exam Constitutional: He is oriented to person, place, and time. He appears well-developed and well-nourished.  HENT:  Head: Normocephalic and atraumatic.  Eyes: Conjunctivae are normal. Pupils are equal, round, and reactive to light.  Neck: Normal range of motion. Neck supple. No tracheal deviation present. Thyromegaly present.  Cardiovascular: Normal rate and regular rhythm.  Respiratory: Effort normal. He has no wheezes. He has rhonchi in the right middle field and the right lower field. He has no rales. He exhibits tenderness.  GI: Soft. Bowel sounds are normal. He exhibits mild distention on the  right. There is no tenderness. Healing abrasions to the abdomen.  Musculoskeletal:  He exhibits edema (right hand and left foot. ) and tenderness.  Keeps RLE extended outward and right ankle with compressive dressing. R hand with splint in place and sensation intact.  Neurological: He is alert and oriented to person, place, and time.  Speech clear. Alert and appropriate. Has reasonable insight and awareness. Follows basic commands without difficulty. RUE/RLE limited due to pain. Moves LUE/LLE without difficulty.  Skin: Skin is warm and dry. Has linear scabs across the occiput left of midline, Well-healed incision upper thigh on the right 2 sutures numerous abrasions over the abdominal area, Psychiatric: His speech is normal and behavior is normal. Thought content normal. Cognition and memory are not impaired. He does not express inappropriate judgment Motor strength is 4 minus in the left grip limited by pain left upper extremity otherwise is 4+ in the deltoid biceps triceps Right upper term E3 minus of deltoid 4 minus of the bicep tricep, grip could not be adequately tested secondary to right forearm splint Right lower extremity to minus and hip flexor 3 minus knee extensor, ankle dorsal flexor cannot be tested secondary to short leg splint Left lower extremity 4 minus at the hip flexor and extensor ankle dorsal flexor plantar flexor Sensation is intact to light touch bilateral upper and lower limbs Musculoskeletal right short leg splint with Ace wrap  Lab Results Last 48 Hours    Results for orders placed or performed during the hospital encounter of 02/08/15 (from the past 48 hour(s))  Glucose, capillary Status: Abnormal   Collection Time: 02/18/15 7:45 AM  Result Value Ref Range   Glucose-Capillary 127 (H) 65 - 99 mg/dL  Glucose, capillary Status: Abnormal   Collection Time: 02/18/15 12:24 PM  Result Value Ref Range   Glucose-Capillary 120 (H) 65 - 99 mg/dL   Glucose, capillary Status: Abnormal   Collection Time: 02/18/15 5:27 PM  Result Value Ref Range   Glucose-Capillary 152 (H) 65 - 99 mg/dL  Glucose, capillary Status: Abnormal   Collection Time: 02/18/15 10:13 PM  Result Value Ref Range   Glucose-Capillary 145 (H) 65 - 99 mg/dL  Glucose, capillary Status: Abnormal   Collection Time: 02/19/15 7:37 AM  Result Value Ref Range   Glucose-Capillary 105 (H) 65 - 99 mg/dL   Comment 1 Notify RN   Glucose, capillary Status: Abnormal   Collection Time: 02/19/15 12:04 PM  Result Value Ref Range   Glucose-Capillary 148 (H) 65 - 99 mg/dL   Comment 1 Notify RN   Glucose, capillary Status: Abnormal   Collection Time: 02/19/15 4:41 PM  Result Value Ref Range   Glucose-Capillary 148 (H) 65 - 99 mg/dL   Comment 1 Notify RN       Imaging Results (Last 48 hours)    No results found.       Medical Problem List and Plan: 1. Functional deficits secondary to polytrauma after tree cutting accident including multiple rib fractures, right pneumothorax with chest tube and removed 02/21/2015, right femoral shaft fracture and right pilon fracture status post ORIF-nonweightbearing and right ulnar fracture status post ORIF weightbearing as tolerated-. 2. DVT Prophylaxis/Anticoagulation: Subcutaneous Lovenox. Check vascular study. Monitor for any bleeding episodes 3. Pain Management: Oxycodone as needed. Monitor with increased mobility 4. Acute blood loss anemia. Follow-up CBC 5. Neuropsych: This patient is capable of making decisions on his own behalf. 6. Skin/Wound Care: Routine skin checks  7. Fluids/Electrolytes/Nutrition: Routine eye and nose with follow-up chemistries 8. Staph tracheobronchitis. Ancef completed. Continue nebulizers as needed for now 9. Insulin-dependent  diabetes mellitus. Levemir 10 units twice a day, Glucophage 1000 mg twice a day,tradjenta 5 mg  twice a day. Check blood sugars before meals and at bedtime 10. Hypertension. Lisinopril 10 mg daily. Monitor with increased mobility 11. Constipation. Adjust bowel program, Will order [facility] G enema tonight   Post Admission Physician Evaluation: 1. Functional deficits secondary to polytrauma after tree cutting accident including multiple rib fractures, right pneumothorax with chest tube and removed 02/21/2015, right femoral shaft fracture and right pilon fracture status post ORIF-nonweightbearing and right ulnar fracture status post ORIF weightbearing as tolerated. 2. Patient is admitted to receive collaborative, interdisciplinary care between the physiatrist, rehab nursing staff, and therapy team. 3. Patient's level of medical complexity and substantial therapy needs in context of that medical necessity cannot be provided at a lesser intensity of care such as a SNF. 4. Patient has experienced substantial functional loss from his/her baseline which was documented above under the "Functional History" and "Functional Status" headings. Judging by the patient's diagnosis, physical exam, and functional history, the patient has potential for functional progress which will result in measurable gains while on inpatient rehab. These gains will be of substantial and practical use upon discharge in facilitating mobility and self-care at the household level. 5. Physiatrist will provide 24 hour management of medical needs as well as oversight of the therapy plan/treatment and provide guidance as appropriate regarding the interaction of the two. 6. 24 hour rehab nursing will assist with bladder management, bowel management, safety, skin/wound care, disease management, medication administration, pain management and patient education and help integrate therapy concepts, techniques,education, etc. 7. PT will assess and treat for/with: pre gait, gait training, endurance , safety, equipment, neuromuscular re  education. Goals are: Supervision to min assist mobility. 8. OT will assess and treat for/with: ADLs, Cognitive perceptual skills, Neuromuscular re education, safety, endurance, equipment. Goals are: Supervision to min assist ADLs. Therapy may proceed with showering this patient. 9. SLP will assess and treat for/with: NA. Goals are: NA. 10. Case Management and Social Worker will assess and treat for psychological issues and discharge planning. 11. Team conference will be held weekly to assess progress toward goals and to determine barriers to discharge. 12. Patient will receive at least 3 hours of therapy per day at least 5 days per week. 13. ELOS: 9-13 days  14. Prognosis: good     Charlett Blake M.D. [facility] FAAPM&R (Sports Med, Neuromuscular Med) Diplomate Am Board of Electrodiagnostic Med  02/23/2015

## 2015-02-23 NOTE — Clinical Social Work Note (Signed)
Clinical Social Worker continuing to follow patient and family for support and discharge planning needs.  Patient has been accepted to inpatient rehab and plans to transfer today.  Patient and family agreeable with discharge plan.  Clinical Social Worker will sign off for now as social work intervention is no longer needed. Please consult [place] again if new need arises.  Barbette Or, [place]

## 2015-02-23 NOTE — Discharge Summary (Signed)
Physician Discharge Summary  Patient ID: Jeremy Burgess MRN: [id number] DOB/AGE: Nov 07, 1965 49 y.o.  Admit date: 02/08/2015 Discharge date: 02/23/2015  Discharge Diagnoses Patient Active Problem List   Diagnosis Date Noted  . Ankle fracture, right 02/09/2015  . Acute blood loss anemia 02/09/2015  . Thrombocytopenia 02/09/2015  . Acute renal failure 02/09/2015  . Acute respiratory failure 02/09/2015  . Traumatic hemopneumothorax 02/09/2015  . HTN (hypertension) 02/09/2015  . Accidentally struck by falling tree 02/08/2015  . Insulin dependent diabetes mellitus 02/08/2015  . Traumatic closed displaced fracture of multiple ribs of right side 02/08/2015  . Closed fracture of right ulna 02/08/2015  . Femur fracture, right 02/08/2015    Consultants Dr. Edmonia Lynch for orthopedic surgery  Dr. Tharon Aquas Trigt for thoracic surgery  Dr. Elmarie Shiley for nephrology  Dr. [name] for anesthesiology  Dr. Alger Simons for PM&R   Procedures 6/29 -- Right tube thoracostomy by Dr. Georganna Skeans   6/29 -- Right tube thoracostomy by Dr. Judeth Horn  6/29 -- Central venous catheter placement by Dr. Hulen Skains  6/29 -- Placement of skeletal traction pin by Dr. Percell Miller  6/30 -- Intramedullary (IM) retrograde femoral nailing, external fixation leg, and open reduction internal fixation of ulnar fracture by Dr. Percell Miller  7/5 -- Open reduction internal fixation pilon fracture and removal of external fixator by Dr. Percell Miller   HPI: Marchia Bond presented to [facility] as a level 1 trauma after he was cutting tree limbs and one fell on his right torso (chest, abdomen, and hip).The tree was approximately 1.20ft in diameter.There was no reported loss of consciousness or head trauma.He had decreased breath sounds and asymmetrical rise of chest on the right.He was transferred by EMS alert and oriented complaining of chest pain and shortness of breath, right lateral abdominal pain, right hip pain, and right lower  extremity pain. He was not able to move his right lower extremity due to pain but gross sensation was intact.He became hypotensive.He was found to have right rib fractures with a pneumothorax on chest x-ray. A right femur/knee/ankle deformity was concerning for fracture.He became progressively dyspneic and somnolent.He was intubated under RSI to protect his airway.He was started on massive blood transfusion protocol.He was intermittently hypotensive but responded to blood products and IV fluids. He was transferred to CT scan which showed only the rib fractures.A right chest tube was placed and he required a second tube for adequate treatment. A femoral line was placed.Orthopedic surgery and thoracic surgery were consulted.Orthopedic surgery placed a traction pin and the patient was admitted to the ICU by the trauma service.   Hospital Course: The patient was taken the following day to the OR by orthopedic surgery for his first operation. Thoracic surgery adopted a wait-and-see approach to plating his rib fractures. He developed an acute kidney injury and renal was consulted. He had an acute blood loss anemia as expected given his utilization of the massive transfusion protocol. His chest tube were able to be weaned to water seal but then had to be put back to suction due to worsening respiratory issues. He developed an OSSA pneumonia and was treated with an appropriate antibiotic course. He was taken back to surgery 5 days later for definitive care of his ankle fracture. Following this, one of his chest tubes was able to be removed. Anesthesiology was consulted for epidural anesthesia but the patient refused. He was then extubated and did well. The remaining chest tube was weaned to water seal but continued to drain large volumes  of fluid that eventually became serous. He was mobilized with physical and occupational therapies who recommended inpatient rehabilitation. They were consulted and agreed with  admission. Eventually the chest tube was removed despite high outputs and the patient was scheduled for placement of a Pleurex catheter but he did not develop a large effusion so this was cancelled. He was able to be discharged to inpatient rehabilitation in good condition.  Inpatient Medications Scheduled Meds: . antiseptic oral rinse  7 mL Mouth Rinse BID  . cefUROXime (ZINACEF)  IV  1.5 g Intravenous Once  . docusate sodium  100 mg Oral BID  . enoxaparin (LOVENOX) injection  30 mg Subcutaneous Q12H  . feeding supplement (ENSURE ENLIVE)  237 mL Oral BID BM  . insulin aspart  0-20 Units Subcutaneous TID AC & HS  . insulin detemir  10 Units Subcutaneous BID  . linagliptin  5 mg Oral BID WC   And  . metFORMIN  1,000 mg Oral BID WC  . lisinopril  10 mg Oral Daily  . magnesium citrate  1 Bottle Oral Once  . multivitamin with minerals  1 tablet Oral Daily  . polyethylene glycol  17 g Oral Daily  . sodium chloride  1,000 mL Intravenous Once  . sodium chloride  10-40 mL Intracatheter Q12H   Continuous Infusions:  PRN Meds:.acetaminophen (TYLENOL) oral liquid 160 mg/5 mL, HYDROmorphone (DILAUDID) injection, ipratropium-albuterol, ondansetron **OR** ondansetron (ZOFRAN) IV, oxyCODONE, sodium chloride, sodium chloride  Home Medications   Medication List    STOP taking these medications        naproxen sodium 220 MG tablet  Commonly known as:  ANAPROX      TAKE these medications        docusate sodium 100 MG capsule  Commonly known as:  COLACE  Take 1 capsule (100 mg total) by mouth 2 (two) times daily.     insulin detemir 100 UNIT/ML injection  Commonly known as:  LEVEMIR  Inject 10 Units into the skin at bedtime.     lisinopril 10 MG tablet  Commonly known as:  PRINIVIL,ZESTRIL  Take 10 mg by mouth daily.     ondansetron 4 MG tablet  Commonly known as:  ZOFRAN  Take 1 tablet (4 mg total) by mouth every 8 (eight) hours as needed for nausea or vomiting.      oxyCODONE-acetaminophen 5-325 MG per tablet  Commonly known as:  PERCOCET  Take 1-2 tablets by mouth every 4 (four) hours as needed for severe pain.     sitaGLIPtin-metformin 50-1000 MG per tablet  Commonly known as:  JANUMET  Take 1 tablet by mouth 2 (two) times daily with a meal.            Follow-up Information    Follow up with MURPHY, TIMOTHY D, MD In 1 week.   Specialty:  Orthopedic Surgery   Contact information:   [address] [place] [address] [phone]       Schedule an appointment as soon as possible for a visit with Len Childs, MD.   Specialty:  Cardiothoracic Surgery   Contact information:   [address] [place] [address] [phone]       Call [facility].   Why:  As needed   Contact information:   [address] [place] [address] [phone]       Signed: Lisette Abu, PA-C Pager: [phone] General Trauma PA  Pager: [phone] 02/23/2015, 10:09 AM

## 2015-02-23 NOTE — Progress Notes (Signed)
Retta Diones, RN Rehab Admission Coordinator Signed Physical Medicine and Rehabilitation PMR Pre-admission 02/17/2015 2:10 PM  Related encounter: ED to Hosp-Admission (Current) from 02/08/2015 in [facility] Collapse All   PMR Admission Coordinator Pre-Admission Assessment  Patient: Jeremy Burgess is an 49 y.o., male MRN: [id number] DOB: 1966/02/23 Height: 6\' 2"  (188 cm) Weight: 117.9 kg (259 lb 14.8 oz)  Insurance Information  PRIMARY: Self pay   Emergency Contact Information Contact Information    Name Relation Home Work [place] Mother [phone]     Dao,[name] Brother   [phone]   Noffsinger,[name] Brother   [phone]   [name] Sister   [phone]     Current Medical History  Patient Admitting Diagnosis: polytrauma after tree accident  History of Present Illness: Jeremy Burgess is a 49 y.o. male with history of HTN, DM- insulin dependent, who was admitted on 02/08/15 after the tree that he was cutting fell on him with complaints of right torso, abdomen and hip pain. Work up revealed right displaced 2-9th rib fractures, moderate right PTX, right femoral shaft fracture, right pilon fracture and right ulna fracture. Patient developed hypotensive shock in ED with decreased LOC requiring intubation as well as pressors. Right chest tube placed for treatment of R-PTX and he was treated with 4 units PRBC and FFP. He was taken to OR for IM nail right femur, ORIF right ulna and external fixation of right ankle by Dr. Percell Miller. 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 No surgical intervention needed for rib fractures per Dr. Prescott Gum. He underwent ORIF right pilon fracture on Dr.  Percell Miller on 07/05 and tolerated vent wean on 02/16/15. Chest tube has been removed with no need for pleurex to be placed. Therapy ongoing and CIR recommended for follow up therapy.  Past Medical History  Past Medical History  Diagnosis Date  . Diabetes mellitus   . Hypertension   . Smoker 0    denies smoking    Family History  family history includes COPD in his mother; Diabetes in his mother.  Prior Rehab/Hospitalizations:  Has the patient had major surgery during 100 days prior to admission? No   Pt had previous outpt rehab following back surgery.  Current Medications   Current facility-administered medications:  . acetaminophen (TYLENOL) solution 650 mg, 650 mg, Per Tube, Q6H PRN, Judeth Horn, MD, 650 mg at 02/11/15 2023 . antiseptic oral rinse (CPC / CETYLPYRIDINIUM CHLORIDE 0.05%) solution 7 mL, 7 mL, Mouth Rinse, BID, Judeth Horn, MD, 7 mL at 02/22/15 1019 . cefUROXime (ZINACEF) 1.5 g in dextrose 5 % 50 mL IVPB, 1.5 g, Intravenous, Once, Ivin Poot, MD . docusate sodium (COLACE) capsule 100 mg, 100 mg, Oral, BID, Judeth Horn, MD, 100 mg at 02/22/15 2252 . enoxaparin (LOVENOX) injection 30 mg, 30 mg, Subcutaneous, Q12H, Lisette Abu, PA-C, 30 mg at 02/22/15 1020 . feeding supplement (ENSURE ENLIVE) (ENSURE ENLIVE) liquid 237 mL, 237 mL, Oral, BID BM, Heather C Pitts, RD, 237 mL at 02/22/15 1435 . HYDROmorphone (DILAUDID) injection 1-2 mg, 1-2 mg, Intravenous, Q4H PRN, Judeth Horn, MD, 2 mg at 02/19/15 1148 . insulin aspart (novoLOG) injection 0-20 Units, 0-20 Units, Subcutaneous, TID AC & HS, Judeth Horn, MD, 3 Units at 02/22/15 1254 . insulin detemir (LEVEMIR) injection 10 Units, 10 Units, Subcutaneous, BID, Judeth Horn, MD, 10 Units at 02/22/15 2252 . ipratropium-albuterol (DUONEB) 0.5-2.5 (3) MG/3ML nebulizer solution  3 mL, 3 mL, Nebulization, Q4H PRN, Judeth Horn, MD . linagliptin (TRADJENTA) tablet 5 mg, 5 mg, Oral, BID WC, 5 mg at  02/22/15 1946 **AND** metFORMIN (GLUCOPHAGE) tablet 1,000 mg, 1,000 mg, Oral, BID WC, Lisette Abu, PA-C, 1,000 mg at 02/22/15 1945 . lisinopril (PRINIVIL,ZESTRIL) tablet 10 mg, 10 mg, Oral, Daily, Lisette Abu, PA-C, 10 mg at 02/22/15 1020 . magnesium citrate solution 1 Bottle, 1 Bottle, Oral, Once, Lisette Abu, PA-C . multivitamin with minerals tablet 1 tablet, 1 tablet, Oral, Daily, Asencion Islam, RD, 1 tablet at 02/22/15 1020 . ondansetron (ZOFRAN) tablet 4 mg, 4 mg, Oral, Q6H PRN **OR** ondansetron (ZOFRAN) injection 4 mg, 4 mg, Intravenous, Q6H PRN, Nat Christen, PA-C, 4 mg at 02/14/15 0834 . oxyCODONE (Oxy IR/ROXICODONE) immediate release tablet 5-10 mg, 5-10 mg, Oral, Q3H PRN, Lovett Calender, PA-C, 10 mg at 02/23/15 0544 . polyethylene glycol (MIRALAX / GLYCOLAX) packet 17 g, 17 g, Oral, Daily, Judeth Horn, MD, 17 g at 02/22/15 1020 . sodium chloride 0.9 % bolus 1,000 mL, 1,000 mL, Intravenous, Once, [employer], PA-C . sodium chloride 0.9 % injection 10-40 mL, 10-40 mL, Intracatheter, Q12H, Judeth Horn, MD, 10 mL at 02/22/15 1000 . sodium chloride 0.9 % injection 10-40 mL, 10-40 mL, Intracatheter, PRN, Judeth Horn, MD, 10 mL at 02/21/15 0939 . sodium chloride 0.9 % injection 3 mL, 3 mL, Intravenous, PRN, Judeth Horn, MD  Patients Current Diet: Diet Carb Modified Fluid consistency:: Thin; Room service appropriate?: Yes with Assist  Precautions / Restrictions Precautions Precautions: Other (comment) Precaution Comments: Rt rib fractures, CTs,  Restrictions Weight Bearing Restrictions: Yes RUE Weight Bearing: Weight bearing as tolerated RLE Weight Bearing: Non weight bearing   Has the patient had 2 or more falls or a fall with injury in the past year? 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   Home Assistive Devices /  Equipment Home Assistive Devices/Equipment: None Home Equipment: None  Prior Device Use: Indicate devices/aids used by the patient prior to current illness, exacerbation or injury? None of the above  Prior Functional Level Prior Function Level of Independence: Independent Comments: Pt works in [employer]. Fully independent, drives   Self Care: Did the patient need help bathing, dressing, using the toilet or eating? Independent  Indoor Mobility: Did the patient need assistance with walking from room to room (with or without device)? Independent  Stairs: Did the patient need assistance with internal or external stairs (with or without device)? Independent  Functional Cognition: Did the patient need help planning regular tasks such as shopping or remembering to take medications? Independent  Current Functional Level Cognition  Overall Cognitive Status: Within Functional Limits for tasks assessed Difficult to assess due to: Intubated Orientation Level: Oriented X4 Following Commands: Follows one step commands inconsistently, Follows one step commands with increased time Safety/Judgement: Decreased awareness of safety, Decreased awareness of deficits   Extremity Assessment (includes Sensation/Coordination)  Upper Extremity Assessment: Defer to OT evaluation RUE Deficits / Details: Pt s/p ORIF ulna. AROM WFL Rt UE   Lower Extremity Assessment: LLE deficits/detail, RLE deficits/detail RLE Deficits / Details: WFL LLE: Unable to fully assess due to pain    ADLs  Overall ADL's : Needs assistance/impaired Eating/Feeding: NPO Grooming: Wash/dry face, Set up, Bed level Upper Body Bathing: Moderate assistance, Bed level Lower Body Bathing: Total assistance, Bed level, +2  for physical assistance Upper Body Dressing : Total assistance, Bed level Lower Body Dressing: Total assistance, Bed level, +2 for physical assistance Toilet Transfer: Total assistance Toilet Transfer  Details (indicate cue type and reason): unable  Toileting- Clothing Manipulation and Hygiene: Total assistance, Bed level, +2 for physical assistance Functional mobility during ADLs: +2 for physical assistance, Total assistance General ADL Comments: Pt up with PT earlier to stand at EOB briefly. Worked on UB bathing and grooming this visit but pt limited with use of L hand due to sharp pain reported at base of L thumb and wrist. Pt states he is unable to squeeze call button with L hand even. He was only able to lightly hold washcloth to wash UB due to pain. Note some edema at base of thumb also. Informed nursing and she states she will get a soft touch call button for pt. Pt able to move R digits actively and he states this is much improved since admission. Pt declines wanting to perform LB bathing right now and states pain 5/10 right now at chest tube site. Called for pain meds. Encouraged pt to perform bilateral shoulder ROM to maintain strength.     Mobility  Overal bed mobility: Needs Assistance Bed Mobility: Supine to Sit, Sit to Supine Supine to sit: Max assist, +2 for physical assistance Sit to supine: Max assist, +2 for physical assistance General bed mobility comments: pt assisted with bridging and moving both LE's, needed truncal assist, but assisted with w/shift and asymmetrical scoot.    Transfers  Overall transfer level: Needs assistance Equipment used: Right platform walker Transfers: Sit to/from Stand Sit to Stand: +2 physical assistance, Max assist, From elevated surface (cues for hand and LE placement, reminders for NWB Rt LE ) General transfer comment: bed-bedside commode-chair-bed    Ambulation / Gait / Stairs / Wheelchair Mobility   Not assessed, anticipate needs   Posture / Balance Dynamic Sitting Balance Sitting balance - Comments: sat EOB 15 with/with L UE assist vs. no UE assist. Needed assist with w/shifting and scooting. Balance Overall balance  assessment: Needs assistance Sitting-balance support: Bilateral upper extremity supported Sitting balance-Leahy Scale: Poor Sitting balance - Comments: sat EOB 15 with/with L UE assist vs. no UE assist. Needed assist with w/shifting and scooting. Standing balance support: Bilateral upper extremity supported Standing balance-Leahy Scale: Poor    Special needs/care consideration BiPAP/CPAP no CPM no  Continuous Drip IV no  Dialysis no  Life Vest no Oxygen no Special Bed  Trach Size no  Wound Vac (area) no  Skin: R LE ace wrapped, R wrist splint  Bowel mgmt: Last documented BM 02/07/15 Bladder mgmt: WDL Diabetic mgmt - yes    Previous Home Environment Living Arrangements: Other relatives (brother and sister in law) Available Help at Discharge: Family, Available 24 hours/day, Available PRN/intermittently Type of Home: House Home Layout: One level Home Access: Stairs to enter [employer] of Steps: 3 Bathroom Shower/Tub: Tub/shower unit, Walk-in shower, Tub only Biochemist, clinical: Standard Home Care Services: No Additional Comments: Plan is for pt to go to brother's at discharge. 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 Pt's brother works but is home in the evenings. Caregiver Availability: 24/7 Discharge Plan Discussed with Primary Caregiver: Yes (discussed by phone with pt's mother and brother) Is Caregiver In Agreement with Plan?: Yes Does Caregiver/Family have Issues with Lodging/Transportation while Pt is in Rehab?: No  Goals/Additional Needs Patient/Family Goal for Rehab: Mod Ind and Supervision with PT/OT; NA for SLP Expected length of stay: 9-13 days Cultural Considerations: none Dietary Needs: carb modified Equipment Needs: to be determined Pt/Family Agrees to Admission and willing to participate: Yes Program Orientation Provided & Reviewed with Pt/Caregiver Including Roles & Responsibilities: Yes  Decrease burden of Care through IP rehab admission: NA  Possible need for SNF placement upon discharge: not anticipated  Patient Condition: This patient's medical and functional status has changed since the consult dated: 02/17/15 in which the Rehabilitation Physician determined and documented that the patient's condition is appropriate for intensive rehabilitative care in an inpatient rehabilitation facility. See "History of Present Illness" (above) for medical update. Functional changes are: Curently requiring max assist +2 for transfers, no ambulation. Patient's medical and functional status update has been discussed with the Rehabilitation physician and patient remains appropriate for inpatient rehabilitation. Will admit to inpatient rehab today.  Preadmission Screen Completed By: Nanetta Batty, PT, 02/23/2015 9:58 AM ______________________________________________________________________  Discussed status with Dr. Letta Pate on 02/23/15 at 1009 and received telephone approval for admission today.  Admission Coordinator: Nanetta Batty, PT, time1009/Date07/14/16          Cosigned by: Charlett Blake, MD at 02/23/2015 10:16 AM  Revision History     Date/Time User Provider Type Action   02/23/2015 10:16 AM Charlett Blake, MD Physician Cosign   02/23/2015 10:09 AM Retta Diones, RN Rehab Admission Coordinator Sign   02/23/2015 10:04 AM Retta Diones, RN Rehab Admission Coordinator Share   02/22/2015 9:28 AM [facility] Rehab Admission Coordinator Share   View Details Report

## 2015-02-23 NOTE — Progress Notes (Signed)
OT Cancellation Note  Patient Details Name: Jeremy Burgess MRN: [id number] DOB: 02-May-1966   Cancelled Treatment:    Reason Eval/Treat Not Completed: Patient at procedure or test/ unavailable. Pt currently in surgery.  Almon Register [phone] 02/23/2015, 8:01 AM

## 2015-02-23 NOTE — Progress Notes (Signed)
Day of Surgery Procedure(s) (LRB): INSERTION PLEURAL DRAINAGE CATHETER (Right) Subjective: Post traumatic R pleurAL EFFUSION PLEURX CATH CANCELLED THIS AM BECAUSE OF MIN EFFUSION  On am CXR AFTER CHEST TUBE PULLED Will see pt back in office w/ CXR to f/u R effusion  Objective: Vital signs in last 24 hours: Temp:  [98.3 F (36.8 C)-98.7 F (37.1 C)] 98.4 F (36.9 C) (07/14 0536) Pulse Rate:  [72-81] 72 (07/14 0536) Cardiac Rhythm:  [-]  Resp:  [16-18] 16 (07/14 0536) BP: (110-123)/(54-60) 123/60 mmHg (07/14 0536) SpO2:  [94 %-97 %] 95 % (07/14 0536)  Hemodynamic parameters for last 24 hours:  stable  Intake/Output from previous day: 07/13 0701 - 07/14 0700 In: 240 [P.O.:240] Out: 2000 [Urine:2000] Intake/Output this shift:      Lab Results:  Recent Labs  02/22/15 0515  WBC 10.6*  HGB 9.3*  HCT 28.8*  PLT 394   BMET: No results for input(s): NA, K, CL, CO2, GLUCOSE, BUN, CREATININE, CALCIUM in the last 72 hours.  PT/INR: No results for input(s): LABPROT, INR in the last 72 hours. ABG    Component Value Date/Time   PHART 7.387 02/15/2015 0858   HCO3 28.3* 02/15/2015 0858   TCO2 30 02/15/2015 0858   ACIDBASEDEF 4.0* 02/08/2015 1435   O2SAT 97.0 02/15/2015 0858   CBG (last 3)   Recent Labs  02/22/15 1656 02/22/15 1943 02/22/15 2223  GLUCAP 188* 171* 119*    Assessment/Plan: S/P Procedure(s) (LRB): INSERTION PLEURAL DRAINAGE CATHETER (Right) Office visit w/ f/u CXR in 2 weeks   LOS: 14 days    Tharon Aquas Trigt III 02/23/2015

## 2015-02-23 NOTE — Progress Notes (Signed)
1545 01/24/15 nsg To rehab alert and oriented patient with polytrauma; complaints of constipation; wound noted on patient's head PA and MD aware see chart.  Oriented to unit set up; Prep patient for CT of abdomen and pelvis.

## 2015-02-23 NOTE — Progress Notes (Signed)
Patient information reviewed and entered into eRehab system by Payal Stanforth, RN, CRRN, PPS Coordinator.  Information including medical coding and functional independence measure will be reviewed and updated through discharge.    

## 2015-02-24 ENCOUNTER — Inpatient Hospital Stay (HOSPITAL_COMMUNITY): Payer: Self-pay | Admitting: Rehabilitation

## 2015-02-24 ENCOUNTER — Inpatient Hospital Stay (HOSPITAL_COMMUNITY): Payer: MEDICAID

## 2015-02-24 ENCOUNTER — Inpatient Hospital Stay (HOSPITAL_COMMUNITY): Payer: Self-pay

## 2015-02-24 ENCOUNTER — Inpatient Hospital Stay (HOSPITAL_COMMUNITY): Payer: MEDICAID | Admitting: Occupational Therapy

## 2015-02-24 DIAGNOSIS — S2241XS Multiple fractures of ribs, right side, sequela: Secondary | ICD-10-CM

## 2015-02-24 DIAGNOSIS — S52291D Other fracture of shaft of right ulna, subsequent encounter for closed fracture with routine healing: Secondary | ICD-10-CM

## 2015-02-24 DIAGNOSIS — S7291XD Unspecified fracture of right femur, subsequent encounter for closed fracture with routine healing: Secondary | ICD-10-CM

## 2015-02-24 DIAGNOSIS — S82871D Displaced pilon fracture of right tibia, subsequent encounter for closed fracture with routine healing: Secondary | ICD-10-CM

## 2015-02-24 LAB — CBC WITH DIFFERENTIAL/PLATELET
BASOS ABS: 0 10*3/uL (ref 0.0–0.1)
BASOS PCT: 0 % (ref 0–1)
EOS ABS: 0.2 10*3/uL (ref 0.0–0.7)
Eosinophils Relative: 2 % (ref 0–5)
HCT: 28.3 % — ABNORMAL LOW (ref 39.0–52.0)
HEMOGLOBIN: 9.2 g/dL — AB (ref 13.0–17.0)
LYMPHS PCT: 13 % (ref 12–46)
Lymphs Abs: 1.3 10*3/uL (ref 0.7–4.0)
MCH: 28.4 pg (ref 26.0–34.0)
MCHC: 32.5 g/dL (ref 30.0–36.0)
MCV: 87.3 fL (ref 78.0–100.0)
Monocytes Absolute: 1 10*3/uL (ref 0.1–1.0)
Monocytes Relative: 11 % (ref 3–12)
NEUTROS ABS: 7.1 10*3/uL (ref 1.7–7.7)
NEUTROS PCT: 74 % (ref 43–77)
Platelets: 411 10*3/uL — ABNORMAL HIGH (ref 150–400)
RBC: 3.24 MIL/uL — ABNORMAL LOW (ref 4.22–5.81)
RDW: 14.4 % (ref 11.5–15.5)
WBC: 9.7 10*3/uL (ref 4.0–10.5)

## 2015-02-24 LAB — COMPREHENSIVE METABOLIC PANEL
ALT: 36 U/L (ref 17–63)
ANION GAP: 6 (ref 5–15)
AST: 49 U/L — ABNORMAL HIGH (ref 15–41)
Albumin: 2 g/dL — ABNORMAL LOW (ref 3.5–5.0)
Alkaline Phosphatase: 429 U/L — ABNORMAL HIGH (ref 38–126)
BUN: 20 mg/dL (ref 6–20)
CHLORIDE: 99 mmol/L — AB (ref 101–111)
CO2: 27 mmol/L (ref 22–32)
Calcium: 7.7 mg/dL — ABNORMAL LOW (ref 8.9–10.3)
Creatinine, Ser: 0.95 mg/dL (ref 0.61–1.24)
GFR calc Af Amer: >60 mL/min (ref >60)
GFR calc non Af Amer: >60 mL/min (ref >60)
GLUCOSE: 77 mg/dL (ref 65–99)
Potassium: 4.5 mmol/L (ref 3.5–5.1)
Sodium: 132 mmol/L — ABNORMAL LOW (ref 135–145)
TOTAL PROTEIN: 6.1 g/dL — AB (ref 6.5–8.1)
Total Bilirubin: 1.5 mg/dL — ABNORMAL HIGH (ref 0.3–1.2)

## 2015-02-24 LAB — GLUCOSE, CAPILLARY
GLUCOSE-CAPILLARY: 113 mg/dL — AB (ref 65–99)
GLUCOSE-CAPILLARY: 110 mg/dL — AB (ref 65–99)
GLUCOSE-CAPILLARY: 72 mg/dL (ref 65–99)
GLUCOSE-CAPILLARY: 125 mg/dL — AB (ref 65–99)
GLUCOSE-CAPILLARY: 141 mg/dL — AB (ref 65–99)
Glucose-Capillary: 168 mg/dL — ABNORMAL HIGH (ref 65–99)

## 2015-02-24 MED ORDER — BACITRACIN-NEOMYCIN-POLYMYXIN OINTMENT TUBE
TOPICAL_OINTMENT | Freq: Two times a day (BID) | CUTANEOUS | Status: DC
  Administered 2015-02-24 – 2015-03-01 (×12): 14.2 via TOPICAL
  Administered 2015-03-02: 1 via TOPICAL
  Administered 2015-03-02 – 2015-03-06 (×8): 14.2 via TOPICAL
  Administered 2015-03-06: 1 via TOPICAL
  Administered 2015-03-07: 14.2 via TOPICAL
  Administered 2015-03-07 – 2015-03-08 (×3): via TOPICAL
  Administered 2015-03-09: 14.2 via TOPICAL
  Administered 2015-03-09 – 2015-03-10 (×3): via TOPICAL
  Administered 2015-03-11: 1 via TOPICAL
  Administered 2015-03-11 – 2015-03-14 (×6): via TOPICAL
  Administered 2015-03-14: 14.2 via TOPICAL
  Administered 2015-03-15 – 2015-03-16 (×3): via TOPICAL
  Filled 2015-02-24 (×2): qty 15

## 2015-02-24 NOTE — Consult Note (Addendum)
WOC wound consult note Reason for Consult: Consult requested for posterior head laceration.  Pt was a trauma from 6/29 when they were hit by a falling tree. Wound type: Healing full thickness wound Measurement: .5X7cm Wound bed: Dry yellow slough area over closed laceration, edges well approximated and no fluctuance or pain when probed. Drainage (amount, consistency, odor) Small amt yellow drainage, no odor  Periwound: Intact hair and skin surrounding Dressing procedure/placement/frequency: Neosporin to soften slough to outer layer and assist with removal.  It would be too difficult to keep a dressing in place over his hair; leave open to air. Cleanse with soap and water, then begin washing hair as soon as patient is approved to shower. This will assist with removal of slough and promote healing of scar tissue. Discussed plan of care with patient and that hair will not regrow over full thickness wound site. He verbalized understanding. Please re-consult if further assistance is needed.  Thank-you,  Julien Girt MSN, [place], [place], [place], [place]
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
## 2015-02-24 NOTE — Progress Notes (Signed)
Occupational Therapy Note  Patient Details  Name: Jeremy Burgess MRN: [id number] Date of Birth: 03-Dec-1965  Today's Date: 02/24/2015 OT Individual Time: 1300-1330 OT Individual Time Calculation (min): 30 min   Pt stated that his pain was "ok", just his ribs hurt but RN had just given him pain meds Individual Therapy  Pt seen for skilled OT intervention with focus on transfers, sit<>stand, standing tolerance, and safety awareness.  Pt required tot A + 2 for scoot transfer recliner->w/c.  Pt required tot A + 2 for sit<>stand with PFRW X 3.  Pt tolerated therapy well and verbalized understanding of stated OT goals and purpose of therapy.   Leotis Shames [facility] 02/24/2015, 1:56 PM
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
## 2015-02-25 ENCOUNTER — Inpatient Hospital Stay (HOSPITAL_COMMUNITY): Payer: MEDICAID | Admitting: Occupational Therapy

## 2015-02-25 ENCOUNTER — Inpatient Hospital Stay (HOSPITAL_COMMUNITY): Payer: MEDICAID | Admitting: Physical Therapy

## 2015-02-25 ENCOUNTER — Encounter (HOSPITAL_COMMUNITY): Payer: Self-pay

## 2015-02-25 DIAGNOSIS — E119 Type 2 diabetes mellitus without complications: Secondary | ICD-10-CM

## 2015-02-25 LAB — GLUCOSE, CAPILLARY
GLUCOSE-CAPILLARY: 150 mg/dL — AB (ref 65–99)
GLUCOSE-CAPILLARY: 112 mg/dL — AB (ref 65–99)
Glucose-Capillary: 76 mg/dL (ref 65–99)
Glucose-Capillary: 145 mg/dL — ABNORMAL HIGH (ref 65–99)

## 2015-02-25 MED ORDER — POLYETHYLENE GLYCOL 3350 17 G PO PACK
17.0000 g | PACK | Freq: Two times a day (BID) | ORAL | Status: DC
  Administered 2015-02-25 – 2015-02-26 (×2): 17 g via ORAL
  Filled 2015-02-25 (×3): qty 1

## 2015-02-25 NOTE — Progress Notes (Signed)
Occupational Therapy Session Note  Patient Details  Name: Jeremy Burgess MRN: [id number] Date of Birth: July 02, 1966  Today's Date: 02/25/2015 OT Individual Time: 0900-1000 OT Individual Time Calculation (min): 60 min    Short Term Goals: Week 1:  OT Short Term Goal 1 (Week 1): Pt will perform toilet transfer to drop arm commode chair with max A of 1 to decrease assistance with functional transfer. OT Short Term Goal 2 (Week 1): Pt will perform LB dressing with max A in order to decrease level of assistance needed with self care. OT Short Term Goal 3 (Week 1): Pt will perform bathing with Mod A in order to decrease level of assistance needed with self care. OT Short Term Goal 4 (Week 1): Pt will perform sit <> stand with assist of 1 person during LB clothing management.   Skilled Therapeutic Interventions/Progress Updates:   Patient seen for ADL retraining and instruction this morning with emphasis on use of AE, functional mobility, postural control, adherence to NWB RLE and WBAT through elbow of RUE, activity tolerance, flexibility and range of motion.    Patient supine in bed upon therapist arrival.  Educated on purpose of session; agreeable.  Educated on transfers from flat bed to simulate home environment; deferred but receptive to need and participated from flat bed with total assist + 1.  Patient completes ADL EOB.  Bathes with minimum assistance for buttocks.  Patient completes sit <> stand + 2 total assistance; therapist foot positioned under patient's R foot to prevent pressure through foot.  Patient with R platform RW for standing balance.  Therapist with max assist standing balance effort with second helper to complete buttocks hygiene/bathing and clothing management over hips.  Patient with increased pain and deferred transfer to wheelchair at end of session.  Completes EOB to supine with + 2 assist.  Patient completes grooming with setup to brush teeth.  Patient left with all needs in  reach.  Required max verbal cues to adhere to NWB R wrist/fingers throughout session.  Therapy Documentation Precautions:  Precautions Precautions: Fall Precaution Comments: Rt rib fractures Restrictions Weight Bearing Restrictions: Yes RUE Weight Bearing: Weight bearing as tolerated RLE Weight Bearing: Non weight bearing Pain: Pain Assessment Pain Assessment: 0-10 (0900-1000 time frame) Pain Score: 7  Pain Type: Surgical pain Pain Location: Leg Pain Orientation: Right Pain Descriptors / Indicators: Aching Pain Onset: On-going Pain Intervention(s): Medication (See eMAR)  See FIM for current functional status  Therapy/Group: Individual Therapy  Osa Craver 02/25/2015, 12:44 PM

## 2015-02-25 NOTE — Progress Notes (Signed)
Physical Therapy Session Note  Patient Details  Name: Jeremy Burgess MRN: [id number] Date of Birth: Mar 04, 1966  Today's Date: 02/25/2015 PT Individual Time: 1100-1200 PT Individual Time Calculation (min): 60 min   Short Term Goals: Week 1:  PT Short Term Goal 1 (Week 1): Pt will perform all aspects of bed mobility with max A of single therapist with HOB flat to better simulate home PT Short Term Goal 2 (Week 1): Pt will perform dynamic sitting balance x 3-5 mins at min A level to better perform ADL's PT Short Term Goal 3 (Week 1): Pt will perform sit<>stand with max A of single therapist while maintaining R LE NWB PT Short Term Goal 4 (Week 1): Pt will propel w/c x 150' with BUEs at S level  PT Short Term Goal 5 (Week 1): Pt will ambulate with R PFRW x 15' with mod A   Skilled Therapeutic Interventions/Progress Updates:  Pt was seen bedside in the am. Pt initially with flat affect but willing to participate with therapy. Pt transferred supine to edge of bed with +2 mod A and verbal cues. Pt required multiple verbal cues throughout treatment to maintain NWB R wrist. Pt tolerated edge of bed about 10 minutes with S. Pt transferred edge of bed to supine with sliding board and mod A +2 with constant verbal cues to maintain WB status R wrist. Pt transported to rehab gym. Pt transferred w/c to edge of mat with +2 mod A with sliding board and verbal cues. Pt transferred edge of mat to supine with +2 mod A. Pt performed heel slides, hip abd/add, and SAQs, 3 sets x 10 reps each. Pt transferred supine to edge of mat with +2 mod A and verbal cues. Pt transferred edge of mat to w/c with sliding board and +2 mod A and verbal cues. Pt returned to room and left sitting up in w/c with call bell within reach. Pt required multiple verbal cues throughout treatment session to prevent WB through R wrist with only occasional cues to maintain WB status R LE.  Therapy Documentation Precautions:  Precautions Precautions:  Fall Precaution Comments: Rt rib fractures Restrictions Weight Bearing Restrictions: Yes RUE Weight Bearing: Weight bearing as tolerated RLE Weight Bearing: Non weight bearing General:  Pain: Pt c/o moderate rib pain.      See FIM for current functional status  Therapy/Group: Individual Therapy  Dub Amis 02/25/2015, 12:51 PM

## 2015-02-25 NOTE — Progress Notes (Signed)
Physical Therapy Session Note  Patient Details  Name: Jeremy Burgess MRN: [id number] Date of Birth: 1966/07/22  Today's Date: 02/25/2015 PT Individual Time: 1300-1330 PT Individual Time Calculation (min): 30 min   Short Term Goals: Week 1:  PT Short Term Goal 1 (Week 1): Pt will perform all aspects of bed mobility with max A of single therapist with HOB flat to better simulate home PT Short Term Goal 2 (Week 1): Pt will perform dynamic sitting balance x 3-5 mins at min A level to better perform ADL's PT Short Term Goal 3 (Week 1): Pt will perform sit<>stand with max A of single therapist while maintaining R LE NWB PT Short Term Goal 4 (Week 1): Pt will propel w/c x 150' with BUEs at S level  PT Short Term Goal 5 (Week 1): Pt will ambulate with R PFRW x 15' with mod A   Skilled Therapeutic Interventions/Progress Updates:  Pt was seen bedside in the pm. Pt transported to rehab gym. Pt transferred w/c to edge of mat with sliding board and mod A with verbal cues to maintain WB status. Pt transferred edge of mat to w/c with sliding board and mod A with verbal cues for technique and WB status. Pt transported back to room. Pt transferred w/c to edge of bed with sliding board and mod A with verbal cues for WB status and technique. Pt transferred edge of bed to supine with max A and verbal cues. Pt left sitting up in bed with call bell within reach.   Therapy Documentation Precautions:  Precautions Precautions: Fall Precaution Comments: Rt rib fractures Restrictions Weight Bearing Restrictions: Yes RUE Weight Bearing: Weight bearing as tolerated RLE Weight Bearing: Non weight bearing General:   Pain: Pt c/o mod R rib pain.   See FIM for current functional status  Therapy/Group: Individual Therapy  Dub Amis 02/25/2015, 3:42 PM
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
## 2015-02-25 NOTE — Progress Notes (Signed)
Occupational Therapy Session Note  Patient Details  Name: Jeremy Burgess MRN: [id number] Date of Birth: [birth year]  Today's Date: 02/25/2015 OT Individual Time: 1515-1600 OT Individual Time Calculation (min): 45 min    Short Term Goals: Week 1:  OT Short Term Goal 1 (Week 1): Pt will perform toilet transfer to drop arm commode chair with max A of 1 to decrease assistance with functional transfer. OT Short Term Goal 2 (Week 1): Pt will perform LB dressing with max A in order to decrease level of assistance needed with self care. OT Short Term Goal 3 (Week 1): Pt will perform bathing with Mod A in order to decrease level of assistance needed with self care. OT Short Term Goal 4 (Week 1): Pt will perform sit <> stand with assist of 1 person during LB clothing management.   Skilled Therapeutic Interventions/Progress Updates:  Upon entering the room, pt supine in bed with 4/10 c/o pain in R LE. Pt performed supine >sit with Mod A to EOB. Pt directed therapist on step by step directions for slide board transfer to demonstrate knowledge. Pt required assistance to properly place slide board. Slide board transfer from bed > wheelchair with mod A. Pt maintaining weight bearing restrictions when transferring to the L during session. OT propelled wheelchair down to ADL apartment for pt's energy conservation. Pt performing slide board transfer wheelchair <> standard bed with mod A. Pt performing supine <>sit with Mod. Pt requiring rest break secondary to fatigue. OT and pt discussed difference between hospital bed and standard bed transfer. Pt returned to room and requesting to get back into bed for rest. Slide board transfer set up A with mod A to bed . Pt supine with call bell and all needed items within reach. Bed alarm activated.   Therapy Documentation Precautions:  Precautions Precautions: Fall Precaution Comments: Rt rib fractures Restrictions Weight Bearing Restrictions: Yes RUE Weight Bearing:  Weight bearing as tolerated RLE Weight Bearing: Non weight bearing Vital Signs: Therapy Vitals Temp: 98.4 F (36.9 C) Temp Source: Oral Pulse Rate: 71 Resp: 18 BP: (!) 117/49 mmHg Patient Position (if appropriate): Lying Oxygen Therapy SpO2: 100 % O2 Device: Not Delivered Pain: Pain Assessment Pain Assessment: 0-10 Pain Score: 2  Pain Type: Surgical pain Pain Location: Leg Pain Orientation: Right Pain Descriptors / Indicators: Aching Pain Onset: On-going Pain Intervention(s): Medication (See eMAR)  See FIM for current functional status  Therapy/Group: Individual Therapy  [name] 02/25/2015, 4:24 PM

## 2015-02-26 ENCOUNTER — Inpatient Hospital Stay (HOSPITAL_COMMUNITY): Payer: Self-pay | Admitting: Physical Therapy

## 2015-02-26 ENCOUNTER — Inpatient Hospital Stay (HOSPITAL_COMMUNITY): Payer: Self-pay

## 2015-02-26 DIAGNOSIS — M79606 Pain in leg, unspecified: Secondary | ICD-10-CM

## 2015-02-26 LAB — CBC WITH DIFFERENTIAL/PLATELET
BASOS PCT: 0 % (ref 0–1)
Basophils Absolute: 0 10*3/uL (ref 0.0–0.1)
EOS ABS: 0.2 10*3/uL (ref 0.0–0.7)
EOS PCT: 2 % (ref 0–5)
HCT: 27.1 % — ABNORMAL LOW (ref 39.0–52.0)
Hemoglobin: 8.8 g/dL — ABNORMAL LOW (ref 13.0–17.0)
LYMPHS ABS: 1.1 10*3/uL (ref 0.7–4.0)
LYMPHS PCT: 11 % — AB (ref 12–46)
MCH: 27.9 pg (ref 26.0–34.0)
MCHC: 32.5 g/dL (ref 30.0–36.0)
MCV: 86 fL (ref 78.0–100.0)
Monocytes Absolute: 0.9 10*3/uL (ref 0.1–1.0)
Monocytes Relative: 9 % (ref 3–12)
NEUTROS ABS: 7.3 10*3/uL (ref 1.7–7.7)
Neutrophils Relative %: 78 % — ABNORMAL HIGH (ref 43–77)
PLATELETS: 448 10*3/uL — AB (ref 150–400)
RBC: 3.15 MIL/uL — ABNORMAL LOW (ref 4.22–5.81)
RDW: 14.2 % (ref 11.5–15.5)
WBC: 9.5 10*3/uL (ref 4.0–10.5)

## 2015-02-26 LAB — GLUCOSE, CAPILLARY
GLUCOSE-CAPILLARY: 158 mg/dL — AB (ref 65–99)
Glucose-Capillary: 90 mg/dL (ref 65–99)
Glucose-Capillary: 115 mg/dL — ABNORMAL HIGH (ref 65–99)
Glucose-Capillary: 135 mg/dL — ABNORMAL HIGH (ref 65–99)

## 2015-02-26 LAB — BASIC METABOLIC PANEL
ANION GAP: 7 (ref 5–15)
BUN: 28 mg/dL — AB (ref 6–20)
CHLORIDE: 99 mmol/L — AB (ref 101–111)
CO2: 26 mmol/L (ref 22–32)
Calcium: 7.9 mg/dL — ABNORMAL LOW (ref 8.9–10.3)
Creatinine, Ser: 1.13 mg/dL (ref 0.61–1.24)
GFR calc non Af Amer: >60 mL/min (ref >60)
Glucose, Bld: 158 mg/dL — ABNORMAL HIGH (ref 65–99)
Potassium: 5.1 mmol/L (ref 3.5–5.1)
Sodium: 132 mmol/L — ABNORMAL LOW (ref 135–145)

## 2015-02-26 MED ORDER — LINACLOTIDE 290 MCG PO CAPS
290.0000 ug | ORAL_CAPSULE | Freq: Every day | ORAL | Status: DC
  Administered 2015-02-26: 290 ug via ORAL
  Filled 2015-02-26 (×2): qty 1

## 2015-02-26 MED ORDER — MAGNESIUM CITRATE PO SOLN
1.0000 | Freq: Once | ORAL | Status: AC
  Administered 2015-02-26: 1 via ORAL
  Filled 2015-02-26: qty 296

## 2015-02-26 MED ORDER — LOPERAMIDE HCL 2 MG PO CAPS
2.0000 mg | ORAL_CAPSULE | ORAL | Status: DC | PRN
  Filled 2015-02-26: qty 2

## 2015-02-26 MED ORDER — ENOXAPARIN SODIUM 120 MG/0.8ML ~~LOC~~ SOLN
120.0000 mg | Freq: Two times a day (BID) | SUBCUTANEOUS | Status: DC
  Administered 2015-02-26 – 2015-02-27 (×3): 120 mg via SUBCUTANEOUS
  Filled 2015-02-26 (×5): qty 0.8

## 2015-02-26 MED ORDER — ENOXAPARIN SODIUM 100 MG/ML ~~LOC~~ SOLN
90.0000 mg | SUBCUTANEOUS | Status: AC
  Administered 2015-02-26: 90 mg via SUBCUTANEOUS
  Filled 2015-02-26: qty 1

## 2015-02-26 NOTE — Progress Notes (Signed)
Staff notified iv team that pt had loose stool and it appears to be on inside of 2 picc port caps.  RN to call MD to get order to remove.  Stool inside cap is obvious. Brandon Melnick, RN  IV Team
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
## 2015-02-26 NOTE — Progress Notes (Signed)
1357 02/26/15 nsg Dr. Alain Marion notifiied of positive DVT's on both legs. New orders noted.

## 2015-02-26 NOTE — Plan of Care (Signed)
Problem: RH BOWEL ELIMINATION Goal: RH STG MANAGE BOWEL WITH ASSISTANCE STG Manage Bowel with mod Assistance.  Outcome: Not Progressing Pt c/o rt lower quad pain for KUB today and CBC

## 2015-02-26 NOTE — Progress Notes (Signed)
Physical Therapy Session Note  Patient Details  Name: Jeremy Burgess MRN: [id number] Date of Birth: 01-Sep-1965  Today's Date: 02/26/2015 PT Individual Time: 4680-3212 PT Individual Time Calculation (min): 45 min   Short Term Goals: Week 1:  PT Short Term Goal 1 (Week 1): Pt will perform all aspects of bed mobility with max A of single therapist with HOB flat to better simulate home PT Short Term Goal 2 (Week 1): Pt will perform dynamic sitting balance x 3-5 mins at min A level to better perform ADL's PT Short Term Goal 3 (Week 1): Pt will perform sit<>stand with max A of single therapist while maintaining R LE NWB PT Short Term Goal 4 (Week 1): Pt will propel w/c x 150' with BUEs at S level  PT Short Term Goal 5 (Week 1): Pt will ambulate with R PFRW x 15' with mod A   Skilled Therapeutic Interventions/Progress Updates:    Pt with limited progress in session secondary to mental rigidity, refusing education, and deferring progression of mobility training. Cueing of limited utility in session due to aforementioned barriers. Pt would continue to benefit from skilled PT services to increase functional mobility.   Therapy Documentation Precautions:  Precautions Precautions: Fall Precaution Comments: Rt rib fractures Restrictions Weight Bearing Restrictions: Yes RUE Weight Bearing: Weight bearing as tolerated RLE Weight Bearing: Non weight bearing Vital Signs: Therapy Vitals Temp: 98.2 F (36.8 C) Temp Source: Oral Pulse Rate: 91 (with activity) Resp: 18 BP: (!) 109/72 mmHg Patient Position (if appropriate): Lying Oxygen Therapy SpO2: 98 % O2 Device: Not Delivered Pain: Pain Assessment Pain Assessment: 0-10 Pain Score: 8  Pain Location: Rib cage Pain Orientation: Right Pain Intervention(s):  (graded activity, education) Mobility:  Mod A with cues for weight shift and technique. Other Treatments:  Pt educated on rehab plan, progressing mobility, safety in mobility, pain  science, and importance of proper technique. Pt performs LAQ, marching, hip abd, hip ER/IR AAROM 2x10 with cues for ventilation throughout transfers x3 in session. Unsupported sitting as active rest.   See FIM for current functional status  Therapy/Group: Individual Therapy  Monia Pouch 02/26/2015, 8:32 AM

## 2015-02-26 NOTE — Progress Notes (Signed)
1950 02/26/15 nsg late entry RN was called to room re: pt c/o unable to breath, nausea and cold sweats. Vital signs 128/87 HR 81 ,02 sat 97% , RR24.  Per patient they checked his CBG and it is 105 . Patient complained of pain rt lower abdomen and feels like gas. MD notified. Orders noted.

## 2015-02-26 NOTE — Progress Notes (Signed)
02/26/15 1926 nursing Patient had a huge bowel movement after  magcitrate and extended all the way to the cast. RN and NT cleaned as much as they could. MD notified. Order noted

## 2015-02-26 NOTE — Progress Notes (Addendum)
Bilateral lower extremity venous duplex completed.  Right:  DVT noted profunda vein.  Calf veins not visualized due to cast.  No evidence of superficial thrombosis.  No Baker's cyst.  Left: DVT noted in the posterior tibial and peroneal veins.  No evidence of superficial thrombosis.  No Baker's cyst.

## 2015-02-26 NOTE — Progress Notes (Signed)
ANTICOAGULATION CONSULT NOTE - Initial Consult  Pharmacy Consult for lovenox Indication: DVT  Allergies  Allergen Reactions  . Bee Venom Other (See Comments)    intolerance    Patient Measurements:    Vital Signs: Temp: 98.2 F (36.8 C) (07/17 0527) Temp Source: Oral (07/17 0527) BP: 126/55 mmHg (07/17 0527) Pulse Rate: 91 (07/17 0827)  Labs:  Recent Labs  02/23/15 1439 02/24/15 0550 02/26/15 1215  HGB 9.4* 9.2* 8.8*  HCT 29.3* 28.3* 27.1*  PLT 417* 411* 448*  CREATININE 0.93 0.95 1.13    Estimated Creatinine Clearance: 105.6 mL/min (by C-G formula based on Cr of 1.13).   Medical History: Past Medical History  Diagnosis Date  . Diabetes mellitus   . Hypertension   . Smoker 0    denies smoking   Assessment: 65 yom admitted to rehab s/p trauma now found to have bilateral DVTs. To start lovenox for anticoagulation. H/H is low and platelets are WNL. No bleeding noted. He received a prophylactic 30mg  dose of lovenox at 1230 today.  Goal of Therapy:  Anti-Xa level 0.6-1 units/ml 4hrs after LMWH dose given Monitor platelets by anticoagulation protocol: Yes   Plan:  - Lovenox 90mg  SQ now (to make a 120mg  dose) then 120mg  SQ Q12H starting tonight - CBC Q72H while on lovenox - F/u plans for oral anticoagulation  Vern Guerette, Rande Lawman 02/26/2015,2:14 PM

## 2015-02-26 NOTE — Progress Notes (Signed)
Orthopedic Tech Progress Note Patient Details:  Jeremy Burgess Feb 12, 1966 [id number] Removed plaster short leg cast from RLE and replaced with fiberglass short leg cast.  Pulses, sensation, motion intact before and after application.  Capillary refill less than 2 seconds before and after application. Casting Type of Cast: Short leg cast Cast Location: RLE Cast Material: Fiberglass Cast Intervention: Removal, Re-application     Darrol Poke 02/26/2015, 9:22 PM

## 2015-02-27 ENCOUNTER — Inpatient Hospital Stay (HOSPITAL_COMMUNITY): Payer: Self-pay | Admitting: Rehabilitation

## 2015-02-27 ENCOUNTER — Inpatient Hospital Stay (HOSPITAL_COMMUNITY): Payer: MEDICAID | Admitting: Occupational Therapy

## 2015-02-27 LAB — CBC
HCT: 27.5 % — ABNORMAL LOW (ref 39.0–52.0)
Hemoglobin: 8.6 g/dL — ABNORMAL LOW (ref 13.0–17.0)
MCH: 27.1 pg (ref 26.0–34.0)
MCHC: 31.3 g/dL (ref 30.0–36.0)
MCV: 86.8 fL (ref 78.0–100.0)
PLATELETS: 447 10*3/uL — AB (ref 150–400)
RBC: 3.17 MIL/uL — ABNORMAL LOW (ref 4.22–5.81)
RDW: 14.3 % (ref 11.5–15.5)
WBC: 7.4 10*3/uL (ref 4.0–10.5)

## 2015-02-27 LAB — GLUCOSE, CAPILLARY
GLUCOSE-CAPILLARY: 106 mg/dL — AB (ref 65–99)
GLUCOSE-CAPILLARY: 178 mg/dL — AB (ref 65–99)
Glucose-Capillary: 99 mg/dL (ref 65–99)
Glucose-Capillary: 94 mg/dL (ref 65–99)
Glucose-Capillary: 103 mg/dL — ABNORMAL HIGH (ref 65–99)

## 2015-02-27 MED ORDER — MUSCLE RUB 10-15 % EX CREA
TOPICAL_CREAM | Freq: Three times a day (TID) | CUTANEOUS | Status: DC
  Administered 2015-02-27 – 2015-03-04 (×14): via TOPICAL
  Administered 2015-03-04: 1 via TOPICAL
  Administered 2015-03-05 – 2015-03-12 (×6): via TOPICAL
  Filled 2015-02-27 (×2): qty 85

## 2015-02-27 NOTE — Progress Notes (Signed)
Physical Therapy Session Note  Patient Details  Name: Jeremy Burgess MRN: [id number] Date of Birth: [birth year]  Today's Date: 02/27/2015 PT Individual Time: 1345-1445 PT Individual Time Calculation (min): 60 min   Short Term Goals: Week 1:  PT Short Term Goal 1 (Week 1): Pt will perform all aspects of bed mobility with max A of single therapist with HOB flat to better simulate home PT Short Term Goal 2 (Week 1): Pt will perform dynamic sitting balance x 3-5 mins at min A level to better perform ADL's PT Short Term Goal 3 (Week 1): Pt will perform sit<>stand with max A of single therapist while maintaining R LE NWB PT Short Term Goal 4 (Week 1): Pt will propel w/c x 150' with BUEs at S level  PT Short Term Goal 5 (Week 1): Pt will ambulate with R PFRW x 15' with mod A   Skilled Therapeutic Interventions/Progress Updates:   Pt received lying in bed, agreeable to therapy session.  Performed bed mobility with HOB flat and without rails at min A level this afternoon with assist needed to guide RLE out of bed and cues for UE use, however he was able to elevate trunk to EOB without assist this afternoon.  Performed squat pivot transfer to w/c at mod A level (+2A for safety) with continued max verbal cues for LLE placement and forward trunk lean for increased buttock clearance.  Once in therapy gym, transferred to therapy mat again as before at min/mod A level.  Better ability to maintain NWB during second transfer as therapist had her foot under his foot to ensure NWB.  Assisted into supine and performed supine therex as follows;  SAQ's x 10 reps RLE, SLR (assisted) RLE x 10 reps, heel slides (assisted) x 10 reps RLE, quad sets x 10 reps BLE.  Continue to discuss pt needing ramp for home and also discussed home set up that if pt needed to be at a w/c level when D/Cs could he fit inside of doorways necessary at home.  Pt states that he would be able to but is unsure about bathroom.  Feel he will likely be  sponge bathing at home initially regardless due to mobility level.  Transitioned back to sitting and ended session with lateral scoots with focus on forward trunk lean, increased forward weight shift and elevating buttocks for decreased shearing.  Transferred back to w/c as above.  Assisted back to room and left in w/c (note added lumbar support and adjusted leg rest for improved sitting tolerance) with all needs in reach.   Therapy Documentation Precautions:  Precautions Precautions: Fall Precaution Comments: Rt rib fractures Restrictions Weight Bearing Restrictions: Yes RUE Weight Bearing: Weight bearing as tolerated RLE Weight Bearing: Non weight bearing  Pain: Pain Assessment Pain Score: 4  Pain Type: Acute pain Pain Location: Rib cage Pain Orientation: Right Pain Intervention(s): Medication (See eMAR)   Locomotion : Ambulation Ambulation/Gait Assistance: 1: +2 Total assist;3: Mod assist Wheelchair Mobility Distance: 65   See FIM for current functional status  Therapy/Group: Individual Therapy  Denice Bors 02/27/2015, 2:17 PM

## 2015-02-27 NOTE — Progress Notes (Signed)
Occupational Therapy Session Note  Patient Details  Name: Jeremy Burgess MRN: [id number] Date of Birth: 09-07-1965  Today's Date: 02/27/2015 OT Individual Time: 0947-0962 OT Individual Time Calculation (min): 61 min  14 missed minutes secondary to pt fatigue and refusal   Short Term Goals: Week 1:  OT Short Term Goal 1 (Week 1): Pt will perform toilet transfer to drop arm commode chair with max A of 1 to decrease assistance with functional transfer. OT Short Term Goal 2 (Week 1): Pt will perform LB dressing with max A in order to decrease level of assistance needed with self care. OT Short Term Goal 3 (Week 1): Pt will perform bathing with Mod A in order to decrease level of assistance needed with self care. OT Short Term Goal 4 (Week 1): Pt will perform sit <> stand with assist of 1 person during LB clothing management.   Skilled Therapeutic Interventions/Progress Updates:  Upon entering the room, pt supine in bed with 5/10 c/o pain in ribs and R LE this session. Pt performed supine >sit with max A to EOB. Pt declining LB bathing this session. Total A for LB dressing with lateral leans to the L and R. Pt reporting pain in R ribs when leaning. Pt clearing bottom from bed with max A and  total A to pull pants over hips. +2 squat pivot to wheelchair as pt reports feeling "weak" and RN reporting shearing on pt's bottom from slide board. Pt seated in wheelchair to complete UB self care and grooming tasks with supervision. OT also educating pt on energy conservation for self care tasks during session with education to continue. Call bell and all needed items within reach upon exiting the room.   Therapy Documentation Precautions:  Precautions Precautions: Fall Precaution Comments: Rt rib fractures Restrictions Weight Bearing Restrictions: Yes RUE Weight Bearing: Weight bearing as tolerated RLE Weight Bearing: Non weight bearing   Pain: Pain Assessment Pain Score: 5  Pain Type: Acute  pain Pain Location: Rib cage Pain Orientation: Right Pain Descriptors / Indicators: Aching Pain Intervention(s): Medication (See eMAR)  See FIM for current functional status  Therapy/Group: Individual Therapy  [name] 02/27/2015, 11:33 AM

## 2015-02-27 NOTE — Plan of Care (Signed)
Problem: RH BOWEL ELIMINATION Goal: RH STG MANAGE BOWEL WITH ASSISTANCE STG Manage Bowel with mod Assistance.  Outcome: Not Progressing Patient with loose  Incontinent stools. On call MD notified. See orders. adm

## 2015-02-27 NOTE — Progress Notes (Signed)
Physical Therapy Session Note  Patient Details  Name: Jeremy Burgess MRN: [id number] Date of Birth: 04-08-1966  Today's Date: 02/27/2015 PT Individual Time: 1100-1201 PT Individual Time Calculation (min): 61 min   Short Term Goals: Week 1:  PT Short Term Goal 1 (Week 1): Pt will perform all aspects of bed mobility with max A of single therapist with HOB flat to better simulate home PT Short Term Goal 2 (Week 1): Pt will perform dynamic sitting balance x 3-5 mins at min A level to better perform ADL's PT Short Term Goal 3 (Week 1): Pt will perform sit<>stand with max A of single therapist while maintaining R LE NWB PT Short Term Goal 4 (Week 1): Pt will propel w/c x 150' with BUEs at S level  PT Short Term Goal 5 (Week 1): Pt will ambulate with R PFRW x 15' with mod A   Skilled Therapeutic Interventions/Progress Updates:   Pt received sitting in w/c in room, reluctantly agreeable to therapy session.  Pt self propelled towards therapy gym x 60' with BUEs at S level, min cues for technique.  Assisted remainder of distance to therapy gym. Once in therapy gym, continued to educate on reason that we can no longer perform SB transfers due to shearing of skin on buttocks due to recent incontinence.  Pt verbalized understanding.  Transferred w/c>therapy mat at mod/max A level with continued cues for forward trunk lean and increased WB through LLE to increase amount of buttocks clearance. Pt continues to shear small amount with increased facilitation from therapist for forward trunk lean.  Once on mat, worked on lift offs from lower then higher mat session x 10 reps in each setting.  Note marked improvement in higher elevation of mat with continued cues for forward trunk lean.  Progressed to gait x 2 reps x 18-20' each with mod A (+2A for safety and to bring chair).  Provided assist for weight shift at hips as well as assist at RLE to maintain NWB and ensure proper placement of RLE.  Ended session with bed  mobility and transfer to/from bed in ADL apt to better simulate home set up.  Pt states brother's bed is approx same height as apt.  Performed squat pivot transfer to/from bed at mod A level with marked improvement noted in elevating buttocks.  Pt able to get into supine with min A for RLE, however requires more mod A when getting to EOB and max verbal cues for safe technique.  Replaced cushion with more pressure relieving cushion and assisted back to room.  Discussed with nursing to have pt perform forward trunk lean onto table for pressure relief (will educate in PM session) and also about better R LE alignment while in bed.  RN verbalized understanding.  Pt left in w/c with all needs in reach and visitor present in room.   Therapy Documentation Precautions:  Precautions Precautions: Fall Precaution Comments: Rt rib fractures Restrictions Weight Bearing Restrictions: Yes RUE Weight Bearing: Weight bearing as tolerated RLE Weight Bearing: Non weight bearing   Vital Signs: Therapy Vitals Temp: 98.2 F (36.8 C) Temp Source: Oral Pulse Rate: 63 Resp: 18 BP: (!) 131/59 mmHg Patient Position (if appropriate): Lying Oxygen Therapy SpO2: 100 % O2 Device: Not Delivered Pain: 5/10 ribs and knee, RN provided pain meds.   See FIM for current functional status  Therapy/Group: Individual Therapy  Denice Bors 02/27/2015, 8:09 AM

## 2015-02-28 ENCOUNTER — Inpatient Hospital Stay (HOSPITAL_COMMUNITY): Payer: Self-pay | Admitting: *Deleted

## 2015-02-28 ENCOUNTER — Inpatient Hospital Stay (HOSPITAL_COMMUNITY): Payer: Self-pay | Admitting: Occupational Therapy

## 2015-02-28 LAB — GLUCOSE, CAPILLARY
GLUCOSE-CAPILLARY: 87 mg/dL (ref 65–99)
Glucose-Capillary: 194 mg/dL — ABNORMAL HIGH (ref 65–99)
Glucose-Capillary: 107 mg/dL — ABNORMAL HIGH (ref 65–99)
Glucose-Capillary: 127 mg/dL — ABNORMAL HIGH (ref 65–99)

## 2015-02-28 MED ORDER — RIVAROXABAN 15 MG PO TABS
15.0000 mg | ORAL_TABLET | Freq: Two times a day (BID) | ORAL | Status: DC
  Administered 2015-02-28 – 2015-03-16 (×33): 15 mg via ORAL
  Filled 2015-02-28 (×38): qty 1

## 2015-02-28 MED ORDER — RIVAROXABAN 20 MG PO TABS: 20.0000 mg | ORAL_TABLET | Freq: Every day | ORAL | Status: DC

## 2015-02-28 MED ORDER — SENNA 8.6 MG PO TABS
1.0000 | ORAL_TABLET | Freq: Every day | ORAL | Status: DC
  Administered 2015-02-28: 8.6 mg via ORAL
  Filled 2015-02-28 (×2): qty 1

## 2015-02-28 NOTE — Care Management Note (Signed)
[facility] Individual Statement of Services  Patient Name:  Jeremy Burgess  Date:  02/28/2015  Welcome to the [facility].  Our goal is to provide you with an individualized program based on your diagnosis and situation, designed to meet your specific needs.  With this comprehensive rehabilitation program, you will be expected to participate in at least 3 hours of rehabilitation therapies Monday-Friday, with modified therapy programming on the weekends.  Your rehabilitation program will include the following services:  Physical Therapy (PT), Occupational Therapy (OT), 24 hour per day rehabilitation nursing, Therapeutic Recreaction (TR), Neuropsychology, Case Management (Social Worker), Rehabilitation Medicine, Nutrition Services and Pharmacy Services  Weekly team conferences will be held on Wednesday to discuss your progress.  Your Social Worker will talk with you frequently to get your input and to update you on team discussions.  Team conferences with you and your family in attendance may also be held.  Expected length of stay: 21-24 days  Overall anticipated outcome: min level of care  Depending on your progress and recovery, your program may change. Your Social Worker will coordinate services and will keep you informed of any changes. Your Social Worker's name and contact numbers are listed  below.  The following services may also be recommended but are not provided by the [facility] will be made to provide these services after discharge if needed.  Arrangements include referral to agencies that provide these services.  Your insurance has been verified to be:  Pending Medicaid Your primary doctor is:  [facility] [facility]  Pertinent information will be shared with your doctor and  your insurance company.  Social Worker:  Ovidio Kin, [place] or ([phone]  Information discussed with and copy given to patient by: Elease Hashimoto, 02/28/2015, 3:05 PM

## 2015-02-28 NOTE — Progress Notes (Signed)
Physical Therapy Session Note  Patient Details  Name: Jeremy Burgess MRN: [id number] Date of Birth: 27-Nov-1965  Today's Date: 02/28/2015 PT Individual Time: 8280-0349 PT Individual Time Calculation (min): 60 min   Short Term Goals: Week 1:  PT Short Term Goal 1 (Week 1): Pt will perform all aspects of bed mobility with max A of single therapist with HOB flat to better simulate home PT Short Term Goal 2 (Week 1): Pt will perform dynamic sitting balance x 3-5 mins at min A level to better perform ADL's PT Short Term Goal 3 (Week 1): Pt will perform sit<>stand with max A of single therapist while maintaining R LE NWB PT Short Term Goal 4 (Week 1): Pt will propel w/c x 150' with BUEs at S level  PT Short Term Goal 5 (Week 1): Pt will ambulate with R PFRW x 15' with mod A   Skilled Therapeutic Interventions/Progress Updates:  Tx focused on functional mobility training, gait with platform RW, and therex for strengthening. Pt supine in bed, reviewed progress towards goals and precautions. Pt still having difficulty maintaining NWB during all mobility and gait, somewhat resistant to safety cues. Pt still c/o limitation of rib pain and L knee pain 10/10, discussed with RN and provided ice.   Therapeutic activity Bed mobility flat HOB no rails performed with Mod lifting assist with cues for independence and technique. Lateral scoot transfer with up to Mod A and max cues for technique to avoid sheering and maintain precautions.  Multiple sit<>stands x6 throughout with +2 lifting assist and max cues for NWB; pt able to keep foot forwards but maintains contact with floor.  Pt propelled WC x100' with S in controlled setting, assist with parts.   Therapeutic exercise - Supine quad sets, glute sets, L SLR x10 with cues - Seated L/R LAQ and marching 2x10 - HS stretch x73min each  Static standing at platform RW 2x65min with safety cues and RLE marching Gait with platform RW 2x10' with +2 for WC follow, Mod A  of PT at trunk.   Pt left up in [facility] with all needs and reviewed safety precautions.      Therapy Documentation Precautions:  Precautions Precautions: Fall Precaution Comments: Rt rib fractures Restrictions Weight Bearing Restrictions: Yes RUE Weight Bearing: Weight bearing as tolerated RLE Weight Bearing: Non weight bearing   Pain: Pain Assessment Pain Score: 4  Pain Type: Acute pain Pain Location: Rib cage Pain Orientation: Right Pain Descriptors / Indicators: Discomfort Pain Intervention(s): Medication (See eMAR)  See FIM for current functional status  Therapy/Group: Individual Therapy  Asriel Westrup, Corinna Lines, PT, DPT  02/28/2015, 9:50 AM

## 2015-02-28 NOTE — Progress Notes (Signed)
[facility] for lovenox -> Xarelto Indication: DVT  Allergies  Allergen Reactions  . Bee Venom Other (See Comments)    intolerance    Patient Measurements:    Vital Signs: Temp: 98.3 F (36.8 C) (07/19 0544) Temp Source: Oral (07/19 0544) BP: 117/57 mmHg (07/19 0544) Pulse Rate: 67 (07/19 0544)  Labs:  Recent Labs  02/26/15 1215 02/27/15 0514  HGB 8.8* 8.6*  HCT 27.1* 27.5*  PLT 448* 447*  CREATININE 1.13  --     Estimated Creatinine Clearance: 105.6 mL/min (by C-G formula based on Cr of 1.13).   Medical History: Past Medical History  Diagnosis Date  . Diabetes mellitus   . Hypertension   . Smoker 0    denies smoking   Assessment: 49 yo M admitted to rehab s/p trauma now found to have bilateral DVTs. Pt currently on Lovenox 120mg  q12h for anticoagulation - last dose given 7/18 2212. H/H low but stable, plt elevated. No bleeding noted. To change to Xarelto today. SCr 1.13, est CrCl 100 ml/min.  Goal of Therapy:  Monitor platelets by anticoagulation protocol: Yes   Plan:  - D/c Lovenox - Xarelto 15mg  po BID with food x 21 days then 20mg  daily with supper beginning 03/21/15 - F/u s/s bleeding   Sherlon Handing, PharmD, BCPS Clinical pharmacist, pager [phone] 02/28/2015,7:30 AM

## 2015-02-28 NOTE — Discharge Instructions (Addendum)
Information on my medicine - XARELTO (rivaroxaban)  This medication education was reviewed with me or my healthcare representative as part of my discharge preparation.    WHY WAS XARELTO PRESCRIBED FOR YOU? Xarelto was prescribed to treat blood clots that may have been found in the veins of your legs (deep vein thrombosis) or in your lungs (pulmonary embolism) and to reduce the risk of them occurring again.  What do you need to know about Xarelto? The starting dose is one 15 mg tablet taken TWICE daily with food for the FIRST 21 DAYS then on (enter date)  03/21/15  the dose is changed to one 20 mg tablet taken ONCE A DAY with your evening meal.  DO NOT stop taking Xarelto without talking to the health care provider who prescribed the medication.  Refill your prescription for 20 mg tablets before you run out.  After discharge, you should have regular check-up appointments with your healthcare provider that is prescribing your Xarelto.  In the future your dose may need to be changed if your kidney function changes by a significant amount.  What do you do if you miss a dose? If you are taking Xarelto TWICE DAILY and you miss a dose, take it as soon as you remember. You may take two 15 mg tablets (total 30 mg) at the same time then resume your regularly scheduled 15 mg twice daily the next day.  If you are taking Xarelto ONCE DAILY and you miss a dose, take it as soon as you remember on the same day then continue your regularly scheduled once daily regimen the next day. Do not take two doses of Xarelto at the same time.   Important Safety Information Xarelto is a blood thinner medicine that can cause bleeding. You should call your healthcare provider right away if you experience any of the following: ? Bleeding from an injury or your nose that does not stop. ? Unusual colored urine (red or dark brown) or unusual colored stools (red or black). ? Unusual bruising for unknown reasons. ? A  serious fall or if you hit your head (even if there is no bleeding).  Some medicines may interact with Xarelto and might increase your risk of bleeding while on Xarelto. To help avoid this, consult your healthcare provider or pharmacist prior to using any new prescription or non-prescription medications, including herbals, vitamins, non-steroidal anti-inflammatory drugs (NSAIDs) and supplements.  This website has more information on Xarelto: [email or web address] Inpatient Rehab Discharge Instructions  Jeremy Burgess Discharge date and time: No discharge date for patient encounter.   Activities/Precautions/ Functional Status: Activity: Nonweightbearing right lower extremity weightbearing as tolerated right upper extremity Diet: diabetic diet Wound Care: keep wound clean and dry Functional status:  ___ No restrictions     ___ Walk up steps independently ___ 24/7 supervision/assistance   ___ Walk up steps with assistance ___ Intermittent supervision/assistance  ___ Bathe/dress independently ___ Walk with walker     ___ Bathe/dress with assistance ___ Walk Independently    ___ Shower independently _x__ Walk with assistance    ___ Shower with assistance ___ No alcohol     ___ Return to work/school ________  Special Instructions: XARELTO 15 mg twice daily until 03/21/2015 then begin 20 mg daily 03/22/2015 3 months and stop   [facility]:    Home Health:   RN ONLY SERVICE ELIGIBLE FOR DUE TO [facility]   Date of last service:03/16/2015  Medical Equipment/Items  Ordered:PLAT FROM ROLLING WALKER, WHEELCHAIR, DROP-ARM BEDSIDE COMMODE & TUB BENCH  Agency/Supplier:[facility]   [phone] Other:PENDING MEDICAID AND DISABILITY-FAMILY TO FOLLOW UP WITH   My questions have been answered and I understand these instructions. I will adhere to these goals and the provided educational materials after my discharge from the  hospital.  Patient/Caregiver Signature _______________________________ Date __________  Clinician Signature _______________________________________ Date __________  Please bring this form and your medication list with you to all your follow-up doctor's appointments.
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
## 2015-02-28 NOTE — Progress Notes (Signed)
Occupational Therapy Session Note  Patient Details  Name: Jeremy Burgess MRN: [id number] Date of Birth: Aug 12, 1966  Today's Date: 02/28/2015 OT Individual Time: 1100-1145 OT Individual Time Calculation (min): 45 min  and Today's Date: 02/28/2015 OT Missed Time: 15 Minutes Missed Time Reason: Pain   Short Term Goals: Week 1:  OT Short Term Goal 1 (Week 1): Pt will perform toilet transfer to drop arm commode chair with max A of 1 to decrease assistance with functional transfer. OT Short Term Goal 2 (Week 1): Pt will perform LB dressing with max A in order to decrease level of assistance needed with self care. OT Short Term Goal 3 (Week 1): Pt will perform bathing with Mod A in order to decrease level of assistance needed with self care. OT Short Term Goal 4 (Week 1): Pt will perform sit <> stand with assist of 1 person during LB clothing management.   Skilled Therapeutic Interventions/Progress Updates:    Pt resting in w/c upon arrival and agreeable to participating in therapy.  Pt declined bathing and dressing this morning.  Pt transitioned to ADL apartment and practiced w/c<>bed transfers-squat-scoot with +2 (for safety)-pt required only steady A.  Pt required min A for supine<>sit to manage/position RLE.  Discussed use of drop arm BSC but did not practice secondary to increase pain on buttocks/sacrum.  Pt requested to return to bed secondary to increased pain while seated.  Pt performed scoot transfer to bed with min A.  NT entered room and completed arranging bedside table, etc for pt. Focus on activity tolerance, functional transfers, bed mobility, discharge planning, and safety awareness.  Therapy Documentation Precautions:  Precautions Precautions: Fall Precaution Comments: Rt rib fractures Restrictions Weight Bearing Restrictions: Yes RUE Weight Bearing: Weight bearing as tolerated RLE Weight Bearing: Non weight bearing General: General OT Amount of Missed Time: 15 Minutes Vital  Signs:  Pain: Pain Assessment Pain Score: 5  Pain Type: Acute pain Pain Location: Rib cage Pain Orientation: Right Pain Descriptors / Indicators: Discomfort Pain Intervention(s):RN aware and premedicated See FIM for current functional status  Therapy/Group: Individual Therapy  Leroy Libman 02/28/2015, 11:57 AM

## 2015-02-28 NOTE — Progress Notes (Signed)
Occupational Therapy Session Note  Patient Details  Name: Jeremy Burgess MRN: [id number] Date of Birth: [birth year]  Today's Date: 02/28/2015 OT Individual Time: 1300-1355 OT Individual Time Calculation (min): 55 min    Short Term Goals: Week 1:  OT Short Term Goal 1 (Week 1): Pt will perform toilet transfer to drop arm commode chair with max A of 1 to decrease assistance with functional transfer. OT Short Term Goal 2 (Week 1): Pt will perform LB dressing with max A in order to decrease level of assistance needed with self care. OT Short Term Goal 3 (Week 1): Pt will perform bathing with Mod A in order to decrease level of assistance needed with self care. OT Short Term Goal 4 (Week 1): Pt will perform sit <> stand with assist of 1 person during LB clothing management.   Skilled Therapeutic Interventions/Progress Updates:    1:1 Focus on LE strengthening in supine in prep for transfers nad sit to stand. Pt wit c/o tight hamstrings ongoing. Pt able to demonstrate bed mobility to come to EOB with close supervision; however has difficulty maintaining nonweightbearing through right hand. Focus on scoot pivot transfer to large drop arm commode. Pt able to perform even transfer with min A however again has difficulty maintaining NWB through hand hand during transfers. Practiced clothing management (up and down on [facility]). Pt required total A with lateral weight shifts to doff and donn pants (brief non attempted). Performed sit to stand with platform RW with mod A +2 for safety and security and then transferred stand pivot to EOB with min A with more than reasonable amt of time. :Pt able to return to supine position with min A for management of right LE. Pt with increased fatigue and sweating at conclusion of session.   Therapy Documentation Precautions:  Precautions Precautions: Fall Precaution Comments: Rt rib fractures Restrictions Weight Bearing Restrictions: Yes RUE Weight Bearing: Weight bearing as  tolerated RLE Weight Bearing: Non weight bearing General: General OT Amount of Missed Time: 5 Minutes due to fatigue and rib pain after therapy  Pain: Pain Assessment Pain Score: 4  Pain Type: Acute pain Pain Location: Rib cage Pain Orientation: Right Pain Descriptors / Indicators: Aching Pain Intervention(s): Medication (See eMAR)  See FIM for current functional status  Therapy/Group: Individual Therapy  Willeen Cass [facility] 02/28/2015, 3:29 PM

## 2015-03-01 ENCOUNTER — Inpatient Hospital Stay (HOSPITAL_COMMUNITY): Payer: MEDICAID | Admitting: Occupational Therapy

## 2015-03-01 ENCOUNTER — Inpatient Hospital Stay (HOSPITAL_COMMUNITY): Payer: Self-pay | Admitting: Rehabilitation

## 2015-03-01 ENCOUNTER — Inpatient Hospital Stay (HOSPITAL_COMMUNITY): Payer: MEDICAID

## 2015-03-01 LAB — GLUCOSE, CAPILLARY
GLUCOSE-CAPILLARY: 119 mg/dL — AB (ref 65–99)
GLUCOSE-CAPILLARY: 143 mg/dL — AB (ref 65–99)
GLUCOSE-CAPILLARY: 142 mg/dL — AB (ref 65–99)
Glucose-Capillary: 98 mg/dL (ref 65–99)

## 2015-03-01 MED ORDER — SENNA 8.6 MG PO TABS
1.0000 | ORAL_TABLET | Freq: Two times a day (BID) | ORAL | Status: DC
  Administered 2015-03-01 – 2015-03-02 (×4): 8.6 mg via ORAL
  Filled 2015-03-01 (×7): qty 1

## 2015-03-01 NOTE — Progress Notes (Signed)
Occupational Therapy Session Note  Patient Details  Name: Jeremy Burgess MRN: [id number] Date of Birth: 1965/11/24  Today's Date: 03/01/2015 OT Individual Time: 1415-1510 OT Individual Time Calculation (min): 55 min  and Today's Date: 03/01/2015 OT Missed Time: 20 Minutes Missed Time Reason: Patient fatigue;Pain;Patient ill (comment) (nausea)   Short Term Goals: Week 1:  OT Short Term Goal 1 (Week 1): Pt will perform toilet transfer to drop arm commode chair with max A of 1 to decrease assistance with functional transfer. OT Short Term Goal 2 (Week 1): Pt will perform LB dressing with max A in order to decrease level of assistance needed with self care. OT Short Term Goal 3 (Week 1): Pt will perform bathing with Mod A in order to decrease level of assistance needed with self care. OT Short Term Goal 4 (Week 1): Pt will perform sit <> stand with assist of 1 person during LB clothing management.   Skilled Therapeutic Interventions/Progress Updates:  Upon entering the room, pt supine in bed with c/o nausea but agreeable to participate in OT session with coaxing of pt. Pt performed squat scoot transfer bed >wheelchair with Mod A for lifting and second helper holding chair steady. Pt propelled wheelchair with B UEs 120' towards laundry room with supervision assistance. Pt required assistance with R LE and wheelchair parts but pt positioned inside of laundry room area. Pt utilizing L UE with long handled reacher to obtain clothing items from dryer and folding them. Pt then returning to room in same manner and placing items into dresser from wheelchair with supervision. Pt engaged in 1 sit >stand with +2 assist to come into full standing. Pt ambulating 8' with platform RW to bed and mod A for balance. Pt appears to be having difficulty 50% of time maintaining weight bearing restrictions on LE. Pt returning to supine with mod A. Call bell and all needed items within reach upon exiting the room.   Therapy  Documentation Precautions:  Precautions Precautions: Fall Precaution Comments: Rt rib fractures Restrictions Weight Bearing Restrictions: Yes RUE Weight Bearing: Weight bearing as tolerated RLE Weight Bearing: Non weight bearing General: General OT Amount of Missed Time: 20 Minutes Vital Signs: Therapy Vitals Temp: 97.3 F (36.3 C) Temp Source: Oral Pulse Rate: 78 Resp: 18 BP: (!) 126/56 mmHg Patient Position (if appropriate): Lying Oxygen Therapy SpO2: 98 % O2 Device: Not Delivered Pain: Pain Assessment Pain Score: 5  Pain Type: Acute pain Pain Location: Rib cage Pain Orientation: Right Pain Descriptors / Indicators: Aching;Discomfort Pain Intervention(s): Medication (See eMAR)  See FIM for current functional status  Therapy/Group: Individual Therapy  [name] 03/01/2015, 3:28 PM
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
## 2015-03-01 NOTE — Progress Notes (Signed)
Physical Therapy Session Note  Patient Details  Name: Jeremy Burgess MRN: [id number] Date of Birth: 12-24-1965  Today's Date: 03/01/2015 PT Individual Time: 1100-1200 PT Individual Time Calculation (min): 60 min   Short Term Goals: Week 1:  PT Short Term Goal 1 (Week 1): Pt will perform all aspects of bed mobility with max A of single therapist with HOB flat to better simulate home PT Short Term Goal 2 (Week 1): Pt will perform dynamic sitting balance x 3-5 mins at min A level to better perform ADL's PT Short Term Goal 3 (Week 1): Pt will perform sit<>stand with max A of single therapist while maintaining R LE NWB PT Short Term Goal 4 (Week 1): Pt will propel w/c x 150' with BUEs at S level  PT Short Term Goal 5 (Week 1): Pt will ambulate with R PFRW x 15' with mod A   Skilled Therapeutic Interventions/Progress Updates:    Session focused on functional bed mobility re-training on flat surface, transfers using scoot pivot technique (pt very reliant on wanting to adjust surface to make for downhill transfer and educated on importance of practicing uneven transfers), sit to stands from 22" height with PFRW with mod A x 3 reps and performing forward hip flexion/extension for movement in RUE x 10 reps each time and to address standing tolerance/balance, gait training with PFRW x 18' with min A with +2 for w/c follow and mod verbal cues to maintain NWB status, and w/c propulsion for BUE strengthening and endurance training. Pt became nauseous and vomitted. RN made aware and assisted pt back to bed end of session with overall mod A.  Pt continues to require encouragement to try ways to increase independence with mobility . Pt with decreased activity tolerance requiring extended rest breaks between activities.   Therapy Documentation Precautions:  Precautions Precautions: Fall Precaution Comments: Rt rib fractures Restrictions Weight Bearing Restrictions: Yes RUE Weight Bearing: Weight bearing as  tolerated RLE Weight Bearing: Non weight bearing    Pain: 10/10 pain in L knee and ribcage. RN notified for pain medication  See FIM for current functional status  Therapy/Group: Individual Therapy  [name], PT, DPT  03/01/2015, 12:06 PM

## 2015-03-01 NOTE — Progress Notes (Signed)
Social Work Patient ID: Jeremy Burgess, male   DOB: 06/13/1966, 49 y.o.   MRN: [id number] Met with pt to discuss team conference goals-min level and discharge date 8/9.  He is aware he will require 24 hr care at discharge and feels his brother will provide this. He is coming in later and he will discuss with him. He is having a better day with his pain, but this flucuates throughout the day. He is hopeful each day will get better. Will work on discharge needs and have brother come in closer to discharge.

## 2015-03-01 NOTE — Progress Notes (Signed)
Occupational Therapy Session Note  Patient Details  Name: Jeremy Burgess MRN: [id number] Date of Birth: [birth year]  Today's Date: 03/01/2015 OT Individual Time: 0900-1005 OT Individual Time Calculation (min): 65 min  10 missed minutes this session  Short Term Goals: Week 1:  OT Short Term Goal 1 (Week 1): Pt will perform toilet transfer to drop arm commode chair with max A of 1 to decrease assistance with functional transfer. OT Short Term Goal 2 (Week 1): Pt will perform LB dressing with max A in order to decrease level of assistance needed with self care. OT Short Term Goal 3 (Week 1): Pt will perform bathing with Mod A in order to decrease level of assistance needed with self care. OT Short Term Goal 4 (Week 1): Pt will perform sit <> stand with assist of 1 person during LB clothing management.   Skilled Therapeutic Interventions/Progress Updates:  Upon entering the room, pt supine in bed with 5/10 c/o pain in L knee this session. Pt performing scoot pivot with +2 A as pt having difficulty off shifting weight and maintaining weight bearing restrictions in R UE. Pt seated in wheelchair at sink side for dressing and bathing tasks. Use of long handled reacher to threading clothing onto B feet. Pt standing with platform RW and +2 assist with second helper pulling up pants. Pt reporting increase in pain to 10/10 in R knee. RN notified and muscle rub applied to area. Pt returning to bed at end of session as he refuses to sit up in chair secondary to pain. Squat pivot +2 transfer onto bed from wheelchair and sit >supine with mod A. Call bell and all needed items within reach upon exiting the room.   Therapy Documentation Precautions:  Precautions Precautions: Fall Precaution Comments: Rt rib fractures Restrictions Weight Bearing Restrictions: Yes RUE Weight Bearing: Weight bearing as tolerated RLE Weight Bearing: Non weight bearing General: General OT Amount of Missed Time: 10  Minutes Pain: Pain Assessment Pain Score: 5  Pain Type: Acute pain Pain Location: Rib cage Pain Orientation: Right Pain Descriptors / Indicators: Aching Pain Intervention(s): Medication (See eMAR)  See FIM for current functional status  Therapy/Group: Individual Therapy  [name] 03/01/2015, 10:16 AM
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
## 2015-03-02 ENCOUNTER — Inpatient Hospital Stay (HOSPITAL_COMMUNITY): Payer: MEDICAID

## 2015-03-02 ENCOUNTER — Inpatient Hospital Stay (HOSPITAL_COMMUNITY): Payer: MEDICAID | Admitting: Occupational Therapy

## 2015-03-02 ENCOUNTER — Other Ambulatory Visit: Payer: Self-pay | Admitting: Cardiothoracic Surgery

## 2015-03-02 DIAGNOSIS — S272XXS Traumatic hemopneumothorax, sequela: Secondary | ICD-10-CM

## 2015-03-02 DIAGNOSIS — M769 Unspecified enthesopathy, lower limb, excluding foot: Secondary | ICD-10-CM

## 2015-03-02 LAB — CBC
HCT: 28.4 % — ABNORMAL LOW (ref 39.0–52.0)
Hemoglobin: 9 g/dL — ABNORMAL LOW (ref 13.0–17.0)
MCH: 27 pg (ref 26.0–34.0)
MCHC: 31.7 g/dL (ref 30.0–36.0)
MCV: 85.3 fL (ref 78.0–100.0)
PLATELETS: 448 10*3/uL — AB (ref 150–400)
RBC: 3.33 MIL/uL — ABNORMAL LOW (ref 4.22–5.81)
RDW: 14.2 % (ref 11.5–15.5)
WBC: 8.3 10*3/uL (ref 4.0–10.5)

## 2015-03-02 LAB — CREATININE, SERUM
CREATININE: 0.89 mg/dL (ref 0.61–1.24)
GFR calc non Af Amer: >60 mL/min (ref >60)

## 2015-03-02 LAB — GLUCOSE, CAPILLARY
GLUCOSE-CAPILLARY: 103 mg/dL — AB (ref 65–99)
GLUCOSE-CAPILLARY: 164 mg/dL — AB (ref 65–99)
Glucose-Capillary: 89 mg/dL (ref 65–99)
Glucose-Capillary: 140 mg/dL — ABNORMAL HIGH (ref 65–99)

## 2015-03-02 MED ORDER — BISACODYL 10 MG RE SUPP
10.0000 mg | Freq: Once | RECTAL | Status: AC
Start: 2015-03-02 — End: 2015-03-02
  Administered 2015-03-02: 10 mg via RECTAL
  Filled 2015-03-02: qty 1
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
## 2015-03-02 NOTE — Progress Notes (Signed)
Occupational Therapy Session Note  Patient Details  Name: Jeremy Burgess MRN: [id number] Date of Birth: [birth year]  Today's Date: 03/02/2015 OT Individual Time: 0258-5277 and 1300-1345 OT Individual Time Calculation (min): 27 min and 45 min   Short Term Goals: Week 1:  OT Short Term Goal 1 (Week 1): Pt will perform toilet transfer to drop arm commode chair with max A of 1 to decrease assistance with functional transfer. OT Short Term Goal 2 (Week 1): Pt will perform LB dressing with max A in order to decrease level of assistance needed with self care. OT Short Term Goal 3 (Week 1): Pt will perform bathing with Mod A in order to decrease level of assistance needed with self care. OT Short Term Goal 4 (Week 1): Pt will perform sit <> stand with assist of 1 person during LB clothing management.   Skilled Therapeutic Interventions/Progress Updates:  Session 1: Upon entering the room, pt seated in wheelchair and transitioning from self care session. Pt reporting 10/10 c/o pain in L knee this session. RN notified and bring medication . Pt performed sit >stand from wheelchair with +2 assistance. Pt hopping 6' with use of platform RW to bed with mod A for safety. Pt performed sit >supine with mod A for B LEs. Call bell and all needed items within reach upon exiting the room.  Session 2: Upon entering the room, pt supine in bed. RN reported pt vomiting prior to therapist arrival. Pt R UE brace no longer donned as it is soiled from being ill. Pt refused to exit and agreeable to in bed therapy this session with max encouragement. Therapist simulated LB dressing by having therapist don tied theraband with use of long handled reacher and then rolling L <> R in order to pull over B hips. Task taking increased time to complete. Pt rolling with supervision and min verbal cues for technique. Pt perform B LE exercises for knee flex/ext and hip adduction/abduction 3 sets of 15. P required rest breaks secondary to  fatigue between sets. Pt reporting not feeling well and wishing to end session. Pt supine in bed with call bell and all needed items within reach.   Therapy Documentation Precautions:  Precautions Precautions: Fall Precaution Comments: Rt rib fractures Restrictions Weight Bearing Restrictions: Yes RUE Weight Bearing: Weight bearing as tolerated RLE Weight Bearing: Non weight bearing Vital Signs: Therapy Vitals BP: 138/64 mmHg  See FIM for current functional status  Therapy/Group: Individual Therapy  [name] 03/02/2015, 12:02 PM

## 2015-03-02 NOTE — Progress Notes (Signed)
Physical Therapy Weekly Progress Note  Patient Details  Name: Jeremy Burgess MRN: [id number] Date of Birth: [birth year]  Beginning of progress report period: February 24, 2015 End of progress report period: March 02, 2015  Patient has met 5 of 5 short term goals.  Pt is making progress though limited by pain, low endurance, and decreased strength. Pt difficulty with maintaining NWB status during functional mobility and requires mod verbal cues. Pt also requires encouragement to attempt tasks to increase independence rather than having someone do it for him in regards to setting up w/c and performing mobility. Planning to d/c to his brother's home with family providing 24/7 care. Recommending ramp for home entry due to WB restrictions.  Patient continues to demonstrate the following deficits: decreased strength, pain, decreased balance, decreased ROM, decreased activity tolerance/endurance, decreased functional mobility and therefore will continue to benefit from skilled PT intervention to enhance overall performance with activity tolerance, balance, postural control, ability to compensate for deficits and knowledge of precautions.  Patient progressing toward long term goals..  Continue plan of care.  PT Short Term Goals Week 1:  PT Short Term Goal 1 (Week 1): Pt will perform all aspects of bed mobility with max A of single therapist with HOB flat to better simulate home PT Short Term Goal 1 - Progress (Week 1): Met PT Short Term Goal 2 (Week 1): Pt will perform dynamic sitting balance x 3-5 mins at min A level to better perform ADL's PT Short Term Goal 2 - Progress (Week 1): Met PT Short Term Goal 3 (Week 1): Pt will perform sit<>stand with max A of single therapist while maintaining R LE NWB PT Short Term Goal 3 - Progress (Week 1): Met PT Short Term Goal 4 (Week 1): Pt will propel w/c x 150' with BUEs at S level  PT Short Term Goal 4 - Progress (Week 1): Met PT Short Term Goal 5 (Week 1): Pt will  ambulate with R PFRW x 15' with mod A  PT Short Term Goal 5 - Progress (Week 1): Met Week 2:  PT Short Term Goal 1 (Week 2): Pt will be able to perform bed mobility with mod A on flat surface PT Short Term Goal 2 (Week 2): Pt will be able to perform sit to stands with mod A with PFRW maintaining NWB status PT Short Term Goal 3 (Week 2): Pt will be able to gait with RW x 20' with min A PT Short Term Goal 4 (Week 2): Pt will be able to perform squat pivot transfer with mod A consistently while maintaining NWB status  Skilled Therapeutic Interventions/Progress Updates:  Ambulation/gait training;Balance/vestibular training;Community reintegration;Discharge planning;DME/adaptive equipment instruction;Functional mobility training;Pain management;Patient/family education;Psychosocial support;Therapeutic Activities;Therapeutic Exercise;UE/LE Strength taining/ROM;UE/LE Coordination activities;Wheelchair propulsion/positioning;Neuromuscular re-education;Stair training;Splinting/orthotics;Disease management/prevention;Skin care/wound management   Therapy Documentation Precautions:  Precautions Precautions: Fall Precaution Comments: Rt rib fractures Restrictions Weight Bearing Restrictions: Yes RUE Weight Bearing: Weight bearing as tolerated RLE Weight Bearing: Non weight bearing   See FIM for current functional status   [name], PT, DPT  03/02/2015, 4:07 PM

## 2015-03-02 NOTE — Progress Notes (Signed)
Physical Therapy Session Note  Patient Details  Name: Jeremy Burgess MRN: [id number] Date of Birth: [birth year]  Today's Date: 03/02/2015 PT Individual Time: 0900-1000 PT Individual Time Calculation (min): 60 min   Short Term Goals: Week 1:  PT Short Term Goal 1 (Week 1): Pt will perform all aspects of bed mobility with max A of single therapist with HOB flat to better simulate home PT Short Term Goal 2 (Week 1): Pt will perform dynamic sitting balance x 3-5 mins at min A level to better perform ADL's PT Short Term Goal 3 (Week 1): Pt will perform sit<>stand with max A of single therapist while maintaining R LE NWB PT Short Term Goal 4 (Week 1): Pt will propel w/c x 150' with BUEs at S level  PT Short Term Goal 5 (Week 1): Pt will ambulate with R PFRW x 15' with mod A   Skilled Therapeutic Interventions/Progress Updates:    Pt with c/o 10/10 L knee pain - no new orders regarding restricted use of extremity. Pt able to perform supine to sit EOB with rails only at S level in preparation for dressing EOB. Pt able to don shirt independently EOB but requires assist with shorts using reacher to assist. Attempted lateral leans but unable to get pants fully up, so performed sit to stand with max A using PFRW (+2 for safety and then to pull up pants) and transferred stand pivot into w/c with mod A and cues for NWB status (pt difficulty keeping RLE off the floor). W/c propulsion with BUE for functional UE strengthening and endurance down to therapy gym. Transfers with scoot pivot technique with emphasis on pt directing w/c parts management and set-up of transfer and requires physical A to mange parts of w/c. Performed transfers with min A out of w/c and mod A from bed and cues for NWB RLE. Focused on BLE therex to address weakness to aid with overall mobility including seated ankle PF/DF (L only), BLE LAQ AAROM, and seated marching AAROM, and then in supine: SAQ, hip abduction/adduction, and heel slides AAROM  (using sheet for self range) x 15 reps each all BLE. Left up in w/c at end of session with all needs in reach.   Therapy Documentation Precautions:  Precautions Precautions: Fall Precaution Comments: Rt rib fractures Restrictions Weight Bearing Restrictions: Yes RUE Weight Bearing: Weight bearing as tolerated RLE Weight Bearing: Non weight bearing   See FIM for current functional status  Therapy/Group: Individual Therapy  [name], PT, DPT  03/02/2015, 10:39 AM

## 2015-03-02 NOTE — Progress Notes (Signed)
Occupational Therapy Session Note  Patient Details  Name: Jeremy Burgess MRN: [id number] Date of Birth: 1965/11/24  Today's Date: 03/02/2015 OT Individual Time: 1000-1100 OT Individual Time Calculation (min): 60 min    Short Term Goals: Week 1:  OT Short Term Goal 1 (Week 1): Pt will perform toilet transfer to drop arm commode chair with max A of 1 to decrease assistance with functional transfer. OT Short Term Goal 2 (Week 1): Pt will perform LB dressing with max A in order to decrease level of assistance needed with self care. OT Short Term Goal 3 (Week 1): Pt will perform bathing with Mod A in order to decrease level of assistance needed with self care. OT Short Term Goal 4 (Week 1): Pt will perform sit <> stand with assist of 1 person during LB clothing management.   Skilled Therapeutic Interventions/Progress Updates:    Pt engaged in BADL retraining including bathing at shower level and dressing with sit<>stand from w/c at sink.  Pt required tot A + 2 for squat pivot transfer to rolling shower chair for transition to shower.  Pt required tot A + 2 to stand to facilitate bathing of periarea.  Pt is able to stand with steady A and take steps for stand pivot transfer with steady A. Pt requires more than a reasonable amount of time to complete tasks and continues to be limited by increase left rib and right knee pain.  Focus on activity tolerance, sit<>stand, standing balance, functional transfers, and safety awareness to increase independence with BADLs.   Therapy Documentation Precautions:  Precautions Precautions: Fall Precaution Comments: Rt rib fractures Restrictions Weight Bearing Restrictions: Yes RUE Weight Bearing: Weight bearing as tolerated RLE Weight Bearing: Non weight bearing Pain:  10/10 in right knee and left ribs; RN aware and repositioned  See FIM for current functional status  Therapy/Group: Individual Therapy  Leroy Libman 03/02/2015, 11:06 AM

## 2015-03-03 ENCOUNTER — Inpatient Hospital Stay (HOSPITAL_COMMUNITY): Payer: MEDICAID | Admitting: Occupational Therapy

## 2015-03-03 ENCOUNTER — Inpatient Hospital Stay (HOSPITAL_COMMUNITY): Payer: MEDICAID

## 2015-03-03 ENCOUNTER — Inpatient Hospital Stay (HOSPITAL_COMMUNITY): Payer: Self-pay

## 2015-03-03 ENCOUNTER — Inpatient Hospital Stay (HOSPITAL_COMMUNITY): Payer: Self-pay | Admitting: Occupational Therapy

## 2015-03-03 LAB — GLUCOSE, CAPILLARY
GLUCOSE-CAPILLARY: 156 mg/dL — AB (ref 65–99)
GLUCOSE-CAPILLARY: 151 mg/dL — AB (ref 65–99)
GLUCOSE-CAPILLARY: 99 mg/dL (ref 65–99)
Glucose-Capillary: 113 mg/dL — ABNORMAL HIGH (ref 65–99)

## 2015-03-03 MED ORDER — BISACODYL 10 MG RE SUPP
10.0000 mg | Freq: Once | RECTAL | Status: AC
Start: 2015-03-03 — End: 2015-03-03
  Administered 2015-03-03: 10 mg via RECTAL
  Filled 2015-03-03: qty 1

## 2015-03-03 MED ORDER — OXYCODONE HCL 5 MG PO TABS
5.0000 mg | ORAL_TABLET | ORAL | Status: DC | PRN
  Administered 2015-03-03 – 2015-03-08 (×17): 10 mg via ORAL
  Filled 2015-03-03 (×16): qty 2

## 2015-03-03 MED ORDER — SENNA 8.6 MG PO TABS
2.0000 | ORAL_TABLET | Freq: Two times a day (BID) | ORAL | Status: DC
  Administered 2015-03-03 – 2015-03-07 (×8): 17.2 mg via ORAL
  Filled 2015-03-03 (×12): qty 2

## 2015-03-03 NOTE — Progress Notes (Signed)
Physical Therapy Session Note  Patient Details  Name: Jeremy Burgess MRN: [id number] Date of Birth: Jun 25, 1966  Today's Date: 03/03/2015 PT Individual Time: 1305-1405 PT Individual Time Calculation (min): 60 min   Short Term Goals: Week 1:  PT Short Term Goal 1 (Week 1): Pt will perform all aspects of bed mobility with max A of single therapist with HOB flat to better simulate home PT Short Term Goal 1 - Progress (Week 1): Met PT Short Term Goal 2 (Week 1): Pt will perform dynamic sitting balance x 3-5 mins at min A level to better perform ADL's PT Short Term Goal 2 - Progress (Week 1): Met PT Short Term Goal 3 (Week 1): Pt will perform sit<>stand with max A of single therapist while maintaining R LE NWB PT Short Term Goal 3 - Progress (Week 1): Met PT Short Term Goal 4 (Week 1): Pt will propel w/c x 150' with BUEs at S level  PT Short Term Goal 4 - Progress (Week 1): Met PT Short Term Goal 5 (Week 1): Pt will ambulate with R PFRW x 15' with mod A  PT Short Term Goal 5 - Progress (Week 1): Met  Skilled Therapeutic Interventions/Progress Updates:    Session focused on functional bed mobility re-training, transfers using slideboard and emphasis on patient starting to direct care and increasing independence with managing legrests and BLE (requires encouragement to participate in this) with overall min A to manage placement of board, w/c propulsion for BUE strengthening and overall endurance, seated LE therex including LAQ for ROM and strengthening x 15 reps x 2 sets, sit to stands x 3 with mod to max A and cues for NWB status from slightly elevated mat table, and gait training x 22' with min A with PFRW and cues for NWB on RLE. Pt limited by pain and poor activity tolerance during session. End of session requested to return to bed despite heavy education on importance of OOB to increase strength, endurance and improve respiratory function. All needs in reach.  Therapy Documentation Precautions:   Precautions Precautions: Fall Precaution Comments: Rt rib fractures Restrictions Weight Bearing Restrictions: Yes RUE Weight Bearing: Weight bearing as tolerated RLE Weight Bearing: Non weight bearing Pain: C/o pain all over - declined pain medication due to not wanting to get sick from taking it.  See FIM for current functional status  Therapy/Group: Individual Therapy  [name], PT, DPT  03/03/2015, 2:56 PM

## 2015-03-03 NOTE — Plan of Care (Signed)
Problem: RH BOWEL ELIMINATION Goal: RH STG MANAGE BOWEL WITH ASSISTANCE STG Manage Bowel with mod Assistance.  Outcome: Not Progressing Last BM 7/17.Pt on senna bid,Had small bm after suppository last evening.

## 2015-03-03 NOTE — Plan of Care (Signed)
Problem: RH SKIN INTEGRITY Goal: RH STG ABLE TO PERFORM INCISION/WOUND CARE W/ASSISTANCE STG Able To Perform Incision/Wound Care With mod Assistance.  Outcome: Not Progressing Nursing performs all skin care at this point.

## 2015-03-03 NOTE — Progress Notes (Signed)
Occupational Therapy Session Note  Patient Details  Name: Jeremy Burgess MRN: [id number] Date of Birth: June 03, 1966  Today's Date: 03/03/2015 OT Individual Time: 1100-1130 OT Individual Time Calculation (min): 30 min    Short Term Goals: Week 2:  OT Short Term Goal 1 (Week 2): Pt will perfrom LB clothing management with assist of 1 person during sit <>stand.  OT Short Term Goal 2 (Week 2): Pt will perform toilet transfer with assist of 1 person in order to decrease level of assist with functional transfer.  OT Short Term Goal 3 (Week 2): Pt will perform toileting with assist of 1 person in order to decrease level of assist for self care. OT Short Term Goal 4 (Week 2): Pt will perfrom shower transfer onto TTB with max A in order to decrease level of assist with functional transfer.   Skilled Therapeutic Interventions/Progress Updates:    1:1 focus on bed mobility with supervision with extra time and VC to maintain weight bearing precautions of right UE. Focus on sit to stand from EOB with problem solving positioning of right UE for support yet keep it close to trunk due to pain with UE abduction/ shoulder elevation/ flexion. With right UE positioned on handle of RW on right (not platform) pt required max A+2 for sit to stand. Min A to sit forwards/ backwards and side stepping. Pt mod A for controlled lower down to EOB for control and pain management. Returned to supine with HOB elevated for eating lunch. Pt reports too much pain on buttocks sitting up in chair.   Therapy Documentation Precautions:  Precautions Precautions: Fall Precaution Comments: Rt rib fractures Restrictions Weight Bearing Restrictions: Yes RUE Weight Bearing: Weight bearing as tolerated RLE Weight Bearing: Non weight bearing Pain:  4/10 in right rib region   See FIM for current functional status  Therapy/Group: Individual Therapy  Willeen Cass [facility] 03/03/2015, 12:03 PM
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
## 2015-03-03 NOTE — Progress Notes (Signed)
Occupational Therapy Session Note  Patient Details  Name: Jeremy Burgess MRN: [id number] Date of Birth: Jul 09, 1966  Today's Date: 03/03/2015 OT Individual Time: 1000-1045 OT Individual Time Calculation (min): 45 min  and Today's Date: 03/03/2015 OT Missed Time: 15 Minutes Missed Time Reason: X-Ray   Short Term Goals: Week 1:  OT Short Term Goal 1 (Week 1): Pt will perform toilet transfer to drop arm commode chair with max A of 1 to decrease assistance with functional transfer. OT Short Term Goal 1 - Progress (Week 1): Progressing toward goal OT Short Term Goal 2 (Week 1): Pt will perform LB dressing with max A in order to decrease level of assistance needed with self care. OT Short Term Goal 2 - Progress (Week 1): Met OT Short Term Goal 3 (Week 1): Pt will perform bathing with Mod A in order to decrease level of assistance needed with self care. OT Short Term Goal 3 - Progress (Week 1): Met OT Short Term Goal 4 (Week 1): Pt will perform sit <> stand with assist of 1 person during LB clothing management.  OT Short Term Goal 4 - Progress (Week 1): Progressing toward goal  Skilled Therapeutic Interventions/Progress Updates:  Upon entering the room, pt supine in bed with 5/10 c/o pain in R rib cage and L knee this session. Pt performing supine >sit with mod A to EOB. Pt engaged in bathing from edge of bed this session with SBA for balance. OT noticing R rib cage area appearing to be swollen and pt reporting pressure from area. RN notified and PA also assess area. Pt continued self care tasks while seated EOB. Pt declining washing buttocks and peri area by stating, "It was washed earlier." However, pt sitting on soiled pad and therapist discussed importance of notifying staff if soiled secondary to current skin breakdown. Therapist assisting pt with hygiene of buttocks and peri area with RN present to assess skin as well. Pt rolling L and R with max A for clothing management and hygiene. 15 missed  minutes secondary to transport arriving to take pt for testing.   Therapy Documentation Precautions:  Precautions Precautions: Fall Precaution Comments: Rt rib fractures Restrictions Weight Bearing Restrictions: Yes RUE Weight Bearing: Weight bearing as tolerated RLE Weight Bearing: Non weight bearing General: General OT Amount of Missed Time: 15 Minutes  See FIM for current functional status  Therapy/Group: Individual Therapy  [name] 03/03/2015, 12:22 PM

## 2015-03-03 NOTE — Progress Notes (Signed)
Nutrition Brief Note  Patient identified on the Malnutrition Screening Tool (MST) Report  Wt Readings from Last 15 Encounters:  03/01/15 236 lb (107.049 kg)  02/17/15 259 lb 14.8 oz (117.9 kg)    Body mass index is 30.29 kg/(m^2). Patient meets criteria for class I obesity based on current BMI. Usual body weight reported to be ~240 lbs.   Current diet order is carbohydrate modified, patient is consuming approximately 100% of meals at this time. Pt reports having a good appetite with no other difficulties. Labs and medications reviewed.   No nutrition interventions warranted at this time. If nutrition issues arise, please consult RD.   Corrin Parker, MS, RD, LDN Pager # [phone] After hours/ weekend pager # [phone]
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
## 2015-03-04 ENCOUNTER — Inpatient Hospital Stay (HOSPITAL_COMMUNITY): Payer: Self-pay

## 2015-03-04 ENCOUNTER — Inpatient Hospital Stay (HOSPITAL_COMMUNITY): Payer: MEDICAID | Admitting: Occupational Therapy

## 2015-03-04 DIAGNOSIS — M25562 Pain in left knee: Secondary | ICD-10-CM

## 2015-03-04 LAB — GLUCOSE, CAPILLARY
GLUCOSE-CAPILLARY: 146 mg/dL — AB (ref 65–99)
Glucose-Capillary: 137 mg/dL — ABNORMAL HIGH (ref 65–99)
Glucose-Capillary: 112 mg/dL — ABNORMAL HIGH (ref 65–99)

## 2015-03-04 MED ORDER — SORBITOL 70 % SOLN
30.0000 mL | Freq: Every day | Status: DC | PRN
Start: 2015-03-04 — End: 2015-03-16
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
## 2015-03-04 NOTE — Progress Notes (Signed)
Occupational Therapy Session Note  Patient Details  Name: Gradie Ohm MRN: [id number] Date of Birth: 14-Jan-1966  Today's Date: 03/04/2015 OT Individual Time: 9244-6286 OT Individual Time Calculation (min): 60 min    Short Term Goals: Week 1:  OT Short Term Goal 1 (Week 1): Pt will perform toilet transfer to drop arm commode chair with max A of 1 to decrease assistance with functional transfer. OT Short Term Goal 1 - Progress (Week 1): Progressing toward goal OT Short Term Goal 2 (Week 1): Pt will perform LB dressing with max A in order to decrease level of assistance needed with self care. OT Short Term Goal 2 - Progress (Week 1): Met OT Short Term Goal 3 (Week 1): Pt will perform bathing with Mod A in order to decrease level of assistance needed with self care. OT Short Term Goal 3 - Progress (Week 1): Met OT Short Term Goal 4 (Week 1): Pt will perform sit <> stand with assist of 1 person during LB clothing management.  OT Short Term Goal 4 - Progress (Week 1): Progressing toward goal Week 2:  OT Short Term Goal 1 (Week 2): Pt will perfrom LB clothing management with assist of 1 person during sit <>stand.  OT Short Term Goal 2 (Week 2): Pt will perform toilet transfer with assist of 1 person in order to decrease level of assist with functional transfer.  OT Short Term Goal 3 (Week 2): Pt will perform toileting with assist of 1 person in order to decrease level of assist for self care. OT Short Term Goal 4 (Week 2): Pt will perfrom shower transfer onto TTB with max A in order to decrease level of assist with functional transfer.   Skilled Therapeutic Interventions/Progress Updates:    Skilled OT intervention with treatment focus on the following:  wc mobility, transfers, functional mobility, standing balance.  Pt performed lateral leans + mod assist to don shorts. Pt. Transferred from EOB to wc with minimal assist + sliding board.  Propelled wc to gym.  Addressed transfer to mat and did sit  to stand with cues for positioning.  Performed functional mobility with platform walker and wc follow for safety with min assist for balance.  Pt. Ambulated about 20-25 feet before resting.   Assisted pt back to room and transferred to bed with min assist with SB. Left pt in bed with call bell,phone within reach.     Therapy Documentation Precautions:  Precautions Precautions: Fall Precaution Comments: Rt rib fractures Restrictions Weight Bearing Restrictions: Yes RUE Weight Bearing: Weight bearing as tolerated RLE Weight Bearing: Non weight bearing      Pain: Pain Assessment Pain Assessment: 0-10 Pain Score: 6 Pain Type: Acute pain Pain Location: Knee Pain Orientation: Left Pain Descriptors / Indicators: Aching Pain Onset: On-going Pain Intervention(s): Medication (See eMAR)  See FIM for current functional status  Therapy/Group: Individual Therapy  Lisa Roca 03/04/2015, 7:00 PM

## 2015-03-05 ENCOUNTER — Inpatient Hospital Stay (HOSPITAL_COMMUNITY): Payer: Self-pay

## 2015-03-05 ENCOUNTER — Inpatient Hospital Stay (HOSPITAL_COMMUNITY): Payer: MEDICAID | Admitting: Occupational Therapy

## 2015-03-05 LAB — GLUCOSE, CAPILLARY
GLUCOSE-CAPILLARY: 131 mg/dL — AB (ref 65–99)
GLUCOSE-CAPILLARY: 113 mg/dL — AB (ref 65–99)
Glucose-Capillary: 96 mg/dL (ref 65–99)
Glucose-Capillary: 111 mg/dL — ABNORMAL HIGH (ref 65–99)

## 2015-03-05 LAB — CBC
HEMATOCRIT: 30.1 % — AB (ref 39.0–52.0)
Hemoglobin: 9.6 g/dL — ABNORMAL LOW (ref 13.0–17.0)
MCH: 27.4 pg (ref 26.0–34.0)
MCHC: 31.9 g/dL (ref 30.0–36.0)
MCV: 86 fL (ref 78.0–100.0)
Platelets: 408 10*3/uL — ABNORMAL HIGH (ref 150–400)
RBC: 3.5 MIL/uL — ABNORMAL LOW (ref 4.22–5.81)
RDW: 14.2 % (ref 11.5–15.5)
WBC: 9.9 10*3/uL (ref 4.0–10.5)
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
## 2015-03-05 NOTE — Progress Notes (Signed)
Occupational Therapy Session Note  Patient Details  Name: Jeremy Burgess MRN: [id number] Date of Birth: July 01, 1966  Today's Date: 03/05/2015 OT Individual Time:  -   0930-1015  (45 min)      Short Term Goals: Week 1:  OT Short Term Goal 1 (Week 1): Pt will perform toilet transfer to drop arm commode chair with max A of 1 to decrease assistance with functional transfer. OT Short Term Goal 1 - Progress (Week 1): Progressing toward goal OT Short Term Goal 2 (Week 1): Pt will perform LB dressing with max A in order to decrease level of assistance needed with self care. OT Short Term Goal 2 - Progress (Week 1): Met OT Short Term Goal 3 (Week 1): Pt will perform bathing with Mod A in order to decrease level of assistance needed with self care. OT Short Term Goal 3 - Progress (Week 1): Met OT Short Term Goal 4 (Week 1): Pt will perform sit <> stand with assist of 1 person during LB clothing management.  OT Short Term Goal 4 - Progress (Week 1): Progressing toward goal Week 2:  OT Short Term Goal 1 (Week 2): Pt will perfrom LB clothing management with assist of 1 person during sit <>stand.  OT Short Term Goal 2 (Week 2): Pt will perform toilet transfer with assist of 1 person in order to decrease level of assist with functional transfer.  OT Short Term Goal 3 (Week 2): Pt will perform toileting with assist of 1 person in order to decrease level of assist for self care. OT Short Term Goal 4 (Week 2): Pt will perfrom shower transfer onto TTB with max A in order to decrease level of assist with functional transfer.   Skilled Therapeutic Interventions/Progress Updates:  Skilled OT intervention with treatment focus on the following: bed mobility, sitting EOB with legs in dependent position.  Pt lying in bed upon OT arrival  Set up for sponge bathe.  Pt performed rolling to right and left with mod assist.  Side lying to sitting  EOB with mod assist.     Attempted to stand with OlT but unable.  Returned to  bed.  Performed more rolling for peri care and pants donning.  Remained in bed with call bell,phone within reach.  Therapy Documentation Precautions:  Precautions Precautions: Fall Precaution Comments: Rt rib fractures Restrictions Weight Bearing Restrictions: Yes RUE Weight Bearing: Weight bearing as tolerated RLE Weight Bearing: Non weight bearing      Pain:  3/10  Left leg     See FIM for current functional status  Therapy/Group: Individual Therapy  Lisa Roca 03/05/2015, 7:53 AM

## 2015-03-05 NOTE — Plan of Care (Signed)
Problem: RH SKIN INTEGRITY Goal: RH STG ABLE TO PERFORM INCISION/WOUND CARE W/ASSISTANCE STG Able To Perform Incision/Wound Care With mod Assistance.  Outcome: Not Progressing Staff provide all skin care at this time.

## 2015-03-06 ENCOUNTER — Encounter (HOSPITAL_COMMUNITY): Payer: Self-pay

## 2015-03-06 ENCOUNTER — Inpatient Hospital Stay (HOSPITAL_COMMUNITY): Payer: Self-pay | Admitting: Occupational Therapy

## 2015-03-06 ENCOUNTER — Inpatient Hospital Stay (HOSPITAL_COMMUNITY): Payer: MEDICAID

## 2015-03-06 LAB — GLUCOSE, CAPILLARY
GLUCOSE-CAPILLARY: 107 mg/dL — AB (ref 65–99)
GLUCOSE-CAPILLARY: 90 mg/dL (ref 65–99)
Glucose-Capillary: 88 mg/dL (ref 65–99)
Glucose-Capillary: 95 mg/dL (ref 65–99)
Glucose-Capillary: 164 mg/dL — ABNORMAL HIGH (ref 65–99)

## 2015-03-06 LAB — URIC ACID: URIC ACID, SERUM: 7.4 mg/dL (ref 4.4–7.6)

## 2015-03-06 NOTE — Progress Notes (Addendum)
Occupational Therapy Session Note  Patient Details  Name: Jeremy Burgess MRN: [id number] Date of Birth: October 26, 1965  Today's Date: 03/06/2015 OT Individual Time: 1000-1057 OT Individual Time Calculation (min): 57 min    Short Term Goals: Week 2:  OT Short Term Goal 1 (Week 2): Pt will perfrom LB clothing management with assist of 1 person during sit <>stand.  OT Short Term Goal 2 (Week 2): Pt will perform toilet transfer with assist of 1 person in order to decrease level of assist with functional transfer.  OT Short Term Goal 3 (Week 2): Pt will perform toileting with assist of 1 person in order to decrease level of assist for self care. OT Short Term Goal 4 (Week 2): Pt will perfrom shower transfer onto TTB with max A in order to decrease level of assist with functional transfer.   Skilled Therapeutic Interventions/Progress Updates:    Session 1:  Pt worked on bathing and dressing sit to supine.  He was able to perform all UB bathing with supervision and all LB bathing as well except for washing peri area.  Utilization of reacher and LH sponge also incorporated when washing his feet and when removing his gripper sock off of the LLE.  Pt performed small lateral leans for removing his shorts with min assist but needed transition back to supine for rolling to his side for therapist to assist with washing his peri area.  Therapist also assisted with donning shorts over his legs in supine as well.  Finished session by rolling side to side for pulling shorts over hips with mod assist.   Session 2:   (14:18-15:31 73 mins)  Pt transferred via sliding board to wheelchair on uneven surface to assist with transfer.  Min assist for board placement and for steadiness during transfer.  Noted pt at times frequently attempting to use the RUE to assist with positioning in wheelchair as well as for wheelchair mobility.  Will need clarification from MD as to if this is appropriate with current weightbearing status only  allowed for use of platform RW.  Worked on multiple sit to stand transitions with use of the platform walker during session as well as stand pivot transfers to various surfaces.  He currently needs max assist for sit to stand from an elevated EOM and mod assist from the wheelchair with pillow also placed on top of the cushion to increase height.  Still demonstrates TDWBing in the RLE during transfers however.  Transitioned to practicing transfer to regular bed in the ADL apartment with max assist as well.  Pt needed mod assist to complete transition to sitting from supine with min assist to move his LEs off of the bed.  Transitioned back to the room via wheelchair with transfer back to bed to conclude session.  Mod assist for sliding board transfer back to the bed.        Therapy Documentation Precautions:  Precautions Precautions: Fall Precaution Comments: Rt rib fractures Restrictions Weight Bearing Restrictions: Yes RUE Weight Bearing: Weight bearing as tolerated (On elbow/forearm for use of platform walker) RLE Weight Bearing: Non weight bearing  Pain: Pain Assessment Pain Assessment: Faces Pain Score: 5  Faces Pain Scale: Hurts little more Pain Type: Acute pain Pain Location: Knee Pain Orientation: Left Pain Descriptors / Indicators: Aching Pain Onset: On-going Pain Intervention(s): Repositioned ADL: See FIM for current functional status  Therapy/Group: Individual Therapy  Garner Dullea OTR/L 03/06/2015, 3:49 PM
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
## 2015-03-06 NOTE — Progress Notes (Signed)
Physical Therapy Session Note  Patient Details  Name: Jeremy Burgess MRN: [id number] Date of Birth: 08/02/1966  Today's Date: 03/06/2015 PT Individual Time: 0800-0900 PT Individual Time Calculation (min): 60 min   Short Term Goals: Week 2:  PT Short Term Goal 1 (Week 2): Pt will be able to perform bed mobility with mod A on flat surface PT Short Term Goal 2 (Week 2): Pt will be able to perform sit to stands with mod A with PFRW maintaining NWB status PT Short Term Goal 3 (Week 2): Pt will be able to gait with RW x 20' with min A PT Short Term Goal 4 (Week 2): Pt will be able to perform squat pivot transfer with mod A consistently while maintaining NWB status  Skilled Therapeutic Interventions/Progress Updates:   Session focused on functional bed mobility retraining (used rails for supine to sit) at S level with extra time and encouragement, transfer training using slideboard (min A times to place slideboard) and min A for uneven transfers but able to transfer back to bed at S level, w/c mobility and parts management training though pt limited due pain and requires A with RLE management on and off legrest as well as removal of legrests for transfers, demonstrated simulated car transfer to van height and unable to perform sit to stand due to LLE pain so trial with slideboard and min A for transfer (though anticipate van height would be higher than what pt eyeballed and would require more A) and requires A to bring both LEs in and out of car, adjusted and added LLE elevating legrest for comfort as pt c/o pain when L knee in flexion, and addressed overall endurance and strengthening with w/c propulsion on unit. End of session requested to return back to bed and all needs in reach.   Therapy Documentation Precautions:  Precautions Precautions: Fall Precaution Comments: Rt rib fractures Restrictions Weight Bearing Restrictions: Yes RUE Weight Bearing: Weight bearing as tolerated RLE Weight Bearing:  Non weight bearing  Pain: 10/10 pain in L knee. Applied ACE wrap until ordered knee sleeve arrives and no official orders but  limited WB to transfers with slideboard only during this session. Discussed with PA who will clarify with MD due to LLE needing to take all the weight due to NWB status.  See FIM for current functional status  Therapy/Group: Individual Therapy  [name], PT, DPT  03/06/2015, 12:06 PM

## 2015-03-07 ENCOUNTER — Inpatient Hospital Stay (HOSPITAL_COMMUNITY): Payer: Self-pay | Admitting: Rehabilitation

## 2015-03-07 ENCOUNTER — Inpatient Hospital Stay (HOSPITAL_COMMUNITY): Payer: MEDICAID

## 2015-03-07 ENCOUNTER — Ambulatory Visit: Payer: Self-pay | Admitting: Cardiothoracic Surgery

## 2015-03-07 LAB — GLUCOSE, CAPILLARY
GLUCOSE-CAPILLARY: 101 mg/dL — AB (ref 65–99)
Glucose-Capillary: 96 mg/dL (ref 65–99)
Glucose-Capillary: 128 mg/dL — ABNORMAL HIGH (ref 65–99)
Glucose-Capillary: 117 mg/dL — ABNORMAL HIGH (ref 65–99)
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
## 2015-03-07 NOTE — Consult Note (Signed)
  INITIAL DIAGNOSTIC EVALUATION - [facility]FIDENTIAL [facility]   MEDICAL NECESSITY:  Jeremy Burgess was seen on the [facility] for an initial diagnostic evaluation owing to the patient's diagnosis of polytrauma.   According to medical records, Jeremy Burgess was admitted to the rehab unit owing to "Functional deficits secondary to polytrauma after tree cutting accident including multiple rib fractures, right pneumothorax with chest tube and removed 02/21/2015, right femoral shaft fracture and right pilon fracture status post ORIF-nonweightbearing and right ulnar fracture status post ORIF weightbearing as tolerated." Records also indicate that he is a "49 y.o. male with history of HTN, DM-insulin dependent, who was admitted on 02/08/15 after the tree that he was cutting fell on him with complaints of right torso, abdomen and hip pain. Work up revealed right displaced 2-9th rib fractures, moderate right PTX, right femoral shaft fracture, right pilon fracture and right ulna fracture. Patient developed hypotensive shock in ED with decreased LOC requiring intubation as well as pressors. Right chest tube placed for treatment of R-PTX and he was treated with 4 units PRBC and FFP.Trauma services at this time has advise conservative care."   During today's visit, Jeremy Burgess denied suffering from any major cognitive difficulties. He said that the tree that he was cutting down struck him in the back of the head but he did not lose consciousness or get dazed. He described his current mood as "pretty good." He has no history of mental health issues or treatment. His only complaint was that his hospital bed is too short when it is tilted upward so that he can sit upright.   No adjustment issues endorsed. No barriers to therapy identified. Suicidal/homicidal ideation, plan or intent was denied. No manic or hypomanic episodes were reported. The patient denied ever  experiencing any auditory/visual hallucinations. No major behavioral or personality changes were endorsed.    Jeremy Burgess feels that he is making progress in therapy. He described the rehab staff as "fine." He has ample support via his family.   PROCEDURES ADMINISTERED: [1 unit 68127] Diagnostic clinical interview  Review of available records  Behavioral Evaluation: Jeremy Burgess was appropriately dressed for season and situation, and he appeared tidy and well-groomed. Normal posture was noted. He was friendly and rapport was easily established. His speech was as expected and he was able to express ideas effectively. His affect was somewhat blunted. Attention and motivation were good.    SUMMARY & IMPRESSION: Jeremy Burgess denied suffering from any cognitive issues. There were no major signs of clinical psychopathology. He is adjusting well to this admission. His only complaint was that his bed is too short when it's tilted forward so that he can sit upright. I told him that I would ask the rehab staff if there was something else available given his medical circumstances. At present, I do not plan to follow-up with Jeremy Burgess unless requested by the patient or staff.     Jeremy Burgess, Psy.D.  Clinical Neuropsychologist

## 2015-03-07 NOTE — Progress Notes (Signed)
Physical Therapy Session Note  Patient Details  Name: Jeremy Burgess MRN: [id number] Date of Birth: [birth year]  Today's Date: 03/07/2015 PT Individual Time: 1100-1155 PT Individual Time Calculation (min): 55 min   Short Term Goals: Week 2:  PT Short Term Goal 1 (Week 2): Pt will be able to perform bed mobility with mod A on flat surface PT Short Term Goal 2 (Week 2): Pt will be able to perform sit to stands with mod A with PFRW maintaining NWB status PT Short Term Goal 3 (Week 2): Pt will be able to gait with RW x 20' with min A PT Short Term Goal 4 (Week 2): Pt will be able to perform squat pivot transfer with mod A consistently while maintaining NWB status  Skilled Therapeutic Interventions/Progress Updates:   Pt received sitting in w/c in room, agreeable to therapy session.  Note representative from [employer] present to don L knee sleeve.  Education on donning as well as cleaning instructions for sleeve.  Pt verbalized understanding.  Skilled session continues to focus on functional transfers to car to simulate home car height as well as to bed in ADL apt to better simulate home. Also continue to work on RLE therex in supine for increase strength as pt is able to WB in future.  Performed w/c mobility throughout session up to 130' at a time with BUEs at S level.  Min cues for set up of w/c at car as well as getting into tighter space.  Also continues to need education on set up of w/c when transferring and is still unable to remove w/c parts himself due to pain and stating "I can't."  Provided max encouragement that pt needing to be as independent as possible when he gets home.   Pt continues to state that he will check and see when family can come in and PT expressing importance of having them come in sooner and also importance of getting ramp at home.  Pt states "my brother is working on it."  Performed car transfer with SB at min/mod A level (again unsure of actual height of van, but pt stating that  it is low).  Continued max verbal cues for head/hip relationship, WB precautions, and increased forward weight shift as he continues to "shear" bottom across board.  Performed w/c<>bed transfer in same manner at min A level with same cues mentioned above.  Performed sit<>supine at min A level for management of RLE, however pt able to manage trunk and LLE during task.  Ended session with supine therex on mat in therapy gym as follows; active assisted knee flex x 10 reps RLE (with 2 knee to chest stretches x 30 secs each), active assisted RLE SLR x 10 reps, hip IR x 10 reps, active assisted hip abd RLE x 10 reps.  Assisted back to w/c as above.  Pt propelled to room.  Left in w/c to eat lunch. All needs in reach.   Therapy Documentation Precautions:  Precautions Precautions: Fall Precaution Comments: Rt rib fractures Restrictions Weight Bearing Restrictions: Yes RUE Weight Bearing: Weight bearing as tolerated RLE Weight Bearing: Non weight bearing   Pain: 6/10 pain in L LE, did not want to take pain meds until he ate lunch.    Locomotion : Ambulation Ambulation/Gait Assistance: 4: Min Financial controller Distance: 130   See FIM for current functional status  Therapy/Group: Individual Therapy  Denice Bors 03/07/2015, 12:48 PM

## 2015-03-07 NOTE — Progress Notes (Signed)
Physical Therapy Session Note  Patient Details  Name: Jeremy Burgess MRN: [id number] Date of Birth: Feb 03, 1966  Today's Date: 03/07/2015 PT Individual Time: 0800-0855 PT Individual Time Calculation (min): 55 min   Short Term Goals: Week 2:  PT Short Term Goal 1 (Week 2): Pt will be able to perform bed mobility with mod A on flat surface PT Short Term Goal 2 (Week 2): Pt will be able to perform sit to stands with mod A with PFRW maintaining NWB status PT Short Term Goal 3 (Week 2): Pt will be able to gait with RW x 20' with min A PT Short Term Goal 4 (Week 2): Pt will be able to perform squat pivot transfer with mod A consistently while maintaining NWB status  Skilled Therapeutic Interventions/Progress Updates:    Session focused on functional bed mobility retraining for supine <-> sit on flat surface (overall min A on flat surface and able to perform S level in hospital bed with HOB elevated and rails) with cues for technique to maintain NWB status during rolling, functional slideboard transfer training with emphasis on uphill/uneven transfers to aid with carryover to home environment and emphasis on pt managing slideboard as able with min A overall, w/c propulsion to therapy gym for overall strengthening and endurance, seated supine BLE therex for strengthening to aid with mobility and transfers (LAQ, marches, heel slides, and hip abduction x 15 reps each and AAROM for supine exercises), and mod A for sit to stand from elevated mat with cues for NWB and gait with min A x 10' for functional mobility training. End of session returned back to bed despite education on importance of trying to stay up and OOB for endurance but pt declined. All needs in reach.   Therapy Documentation Precautions:  Precautions Precautions: Fall Precaution Comments: Rt rib fractures Restrictions Weight Bearing Restrictions: Yes RUE Weight Bearing: Weight bearing as tolerated RLE Weight Bearing: Non weight  bearing  Pain:  Premedicated for pain in LLE, RLE, and ribs. Rest breaks as needed and limited to tolerance.   See FIM for current functional status  Therapy/Group: Individual Therapy  [name], PT, DPT  03/07/2015, 8:56 AM

## 2015-03-07 NOTE — Progress Notes (Signed)
Occupational Therapy Session Note  Patient Details  Name: Jaquise Faux MRN: [id number] Date of Birth: [birth year]  Today's Date: 03/07/2015 OT Individual Time: 1001-1100 and 1330-1400 OT Individual Time Calculation (min): 59 min and 30 min    Short Term Goals: Week 2:  OT Short Term Goal 1 (Week 2): Pt will perfrom LB clothing management with assist of 1 person during sit <>stand.  OT Short Term Goal 2 (Week 2): Pt will perform toilet transfer with assist of 1 person in order to decrease level of assist with functional transfer.  OT Short Term Goal 3 (Week 2): Pt will perform toileting with assist of 1 person in order to decrease level of assist for self care. OT Short Term Goal 4 (Week 2): Pt will perfrom shower transfer onto TTB with max A in order to decrease level of assist with functional transfer.   Skilled Therapeutic Interventions/Progress Updates:    Session 1: Pt seen for ADL retraining with focus on bed mobility, lateral leans, use of AE, functional transfers, and activity tolerance. Pt received supine in bed. Completed 90% of bathing task seated EOB then required assist for buttocks hygiene via rolling in bed with min A. Pt completed supine<>sit during ADL with min A to manage RLE. Pt utilized lateral lean technique to doff pants entirely, requiring therapist to assist with apprx 30% of clothing management up. Pt required min cues for adherence to precautions during ADLs. Pt completed SBT bed>w/c with min A and focus on anterior weight shift to avoid shearing. Transferred to mat table with min A going to L as pt declined practicing transfer to R despite education and encouragement. Completed sit<>stand 3x from mat table with min A using RW and pt adhering to NWB precaution on RLE. Pt returned to room and left sitting in w/c with all needs in reach.   Session 2: Pt seen for 1:1 OT session with focus on bed mobility, functional transfers, and activity tolerance. Pt received supine in bed.  Completed supine>sit with HOB slightly elevated and use of sheet for management of RLE at supervision level. Completed SBT bed>w/c with min A and focus on anterior weight shift to prevent sheering. Practiced SBT w/c>low couch with mod A for controlled transition down incline and max A couch>w/c. Following long rest break, completed STS 1x with min A. Pt returned to room and transferred back to bed with min A and required min A for sit>supine to manage RLE onto bed. Pt left supine in bed with all needs in reach.   Therapy Documentation Precautions:  Precautions Precautions: Fall Precaution Comments: Rt rib fractures Restrictions Weight Bearing Restrictions: Yes RUE Weight Bearing: Weight bearing as tolerated RLE Weight Bearing: Non weight bearing General:   Vital Signs:   Pain: 5/10 pain in ribs and LLE  See FIM for current functional status  Therapy/Group: Individual Therapy  Duayne Cal 03/07/2015, 12:08 PM

## 2015-03-08 ENCOUNTER — Inpatient Hospital Stay (HOSPITAL_COMMUNITY): Payer: MEDICAID | Admitting: Occupational Therapy

## 2015-03-08 ENCOUNTER — Inpatient Hospital Stay (HOSPITAL_COMMUNITY): Payer: Self-pay | Admitting: Rehabilitation

## 2015-03-08 ENCOUNTER — Inpatient Hospital Stay (HOSPITAL_COMMUNITY): Payer: Self-pay

## 2015-03-08 DIAGNOSIS — K5901 Slow transit constipation: Secondary | ICD-10-CM

## 2015-03-08 LAB — CBC
HCT: 28.2 % — ABNORMAL LOW (ref 39.0–52.0)
Hemoglobin: 9.2 g/dL — ABNORMAL LOW (ref 13.0–17.0)
MCH: 27.8 pg (ref 26.0–34.0)
MCHC: 32.6 g/dL (ref 30.0–36.0)
MCV: 85.2 fL (ref 78.0–100.0)
Platelets: 328 10*3/uL (ref 150–400)
RBC: 3.31 MIL/uL — AB (ref 4.22–5.81)
RDW: 14 % (ref 11.5–15.5)
WBC: 9.6 10*3/uL (ref 4.0–10.5)

## 2015-03-08 LAB — GLUCOSE, CAPILLARY
Glucose-Capillary: 117 mg/dL — ABNORMAL HIGH (ref 65–99)
Glucose-Capillary: 149 mg/dL — ABNORMAL HIGH (ref 65–99)
Glucose-Capillary: 105 mg/dL — ABNORMAL HIGH (ref 65–99)
Glucose-Capillary: 92 mg/dL (ref 65–99)

## 2015-03-08 MED ORDER — SENNOSIDES-DOCUSATE SODIUM 8.6-50 MG PO TABS
2.0000 | ORAL_TABLET | Freq: Two times a day (BID) | ORAL | Status: DC
  Administered 2015-03-08 – 2015-03-15 (×16): 2 via ORAL
  Filled 2015-03-08 (×15): qty 2

## 2015-03-08 MED ORDER — BISACODYL 10 MG RE SUPP
10.0000 mg | Freq: Every day | RECTAL | Status: DC | PRN
  Administered 2015-03-08: 10 mg via RECTAL
  Filled 2015-03-08: qty 1

## 2015-03-08 MED ORDER — OXYCODONE HCL 5 MG PO TABS
5.0000 mg | ORAL_TABLET | ORAL | Status: DC | PRN
  Administered 2015-03-08 – 2015-03-16 (×22): 5 mg via ORAL
  Filled 2015-03-08 (×23): qty 1

## 2015-03-08 NOTE — Progress Notes (Signed)
Subjective/Complaints: Appreciate Neuropsych note ROS-- good appetite, no abd pain, no SOB, +constipation  Objective: Vital Signs: Blood pressure 119/57, pulse 78, temperature 98.2 F (36.8 C), temperature source Oral, resp. rate 18, weight 107.049 kg (236 lb), SpO2 97 %. No results found. Results for orders placed or performed during the hospital encounter of 02/23/15 (from the past 72 hour(s))  Glucose, capillary     Status: Abnormal   Collection Time: 03/05/15 11:12 AM  Result Value Ref Range   Glucose-Capillary 131 (H) 65 - 99 mg/dL   Comment 1 Notify RN   Glucose, capillary     Status: Abnormal   Collection Time: 03/05/15  4:54 PM  Result Value Ref Range   Glucose-Capillary 113 (H) 65 - 99 mg/dL   Comment 1 Notify RN   Glucose, capillary     Status: Abnormal   Collection Time: 03/05/15  9:04 PM  Result Value Ref Range   Glucose-Capillary 111 (H) 65 - 99 mg/dL   Comment 1 Notify RN   Glucose, capillary     Status: None   Collection Time: 03/06/15  7:20 AM  Result Value Ref Range   Glucose-Capillary 88 65 - 99 mg/dL   Comment 1 Notify RN   Uric acid     Status: None   Collection Time: 03/06/15  9:25 AM  Result Value Ref Range   Uric Acid, Serum 7.4 4.4 - 7.6 mg/dL  Glucose, capillary     Status: None   Collection Time: 03/06/15 12:22 PM  Result Value Ref Range   Glucose-Capillary 95 65 - 99 mg/dL   Comment 1 Notify RN   Glucose, capillary     Status: Abnormal   Collection Time: 03/06/15  4:19 PM  Result Value Ref Range   Glucose-Capillary 164 (H) 65 - 99 mg/dL   Comment 1 Notify RN   Glucose, capillary     Status: None   Collection Time: 03/06/15  8:49 PM  Result Value Ref Range   Glucose-Capillary 90 65 - 99 mg/dL   Comment 1 Notify RN   Glucose, capillary     Status: None   Collection Time: 03/07/15  6:56 AM  Result Value Ref Range   Glucose-Capillary 96 65 - 99 mg/dL  Glucose, capillary     Status: Abnormal   Collection Time: 03/07/15 12:00 PM  Result  Value Ref Range   Glucose-Capillary 128 (H) 65 - 99 mg/dL  Glucose, capillary     Status: Abnormal   Collection Time: 03/07/15  4:27 PM  Result Value Ref Range   Glucose-Capillary 101 (H) 65 - 99 mg/dL  Glucose, capillary     Status: Abnormal   Collection Time: 03/07/15  8:51 PM  Result Value Ref Range   Glucose-Capillary 117 (H) 65 - 99 mg/dL  CBC     Status: Abnormal   Collection Time: 03/08/15  5:48 AM  Result Value Ref Range   WBC 9.6 4.0 - 10.5 K/uL   RBC 3.31 (L) 4.22 - 5.81 MIL/uL   Hemoglobin 9.2 (L) 13.0 - 17.0 g/dL   HCT 28.2 (L) 39.0 - 52.0 %   MCV 85.2 78.0 - 100.0 fL   MCH 27.8 26.0 - 34.0 pg   MCHC 32.6 30.0 - 36.0 g/dL   RDW 14.0 11.5 - 15.5 %   Platelets 328 150 - 400 K/uL  Glucose, capillary     Status: Abnormal   Collection Time: 03/08/15  6:38 AM  Result Value Ref Range   Glucose-Capillary  117 (H) 65 - 99 mg/dL     HEENT: normal Cardio: RRR Resp: CTA B/L GI: BS positive Extremity:  Pulses positive and No Edema Skin:   Intact, Wound C/D/I and sutures out Neuro: Alert/Oriented Musc/Skel:  No evidence of knee effusion on Left, tenderness over the left quad tendon,no step off, some mild swelling  not over patellar tendon, pain with attempted knee ext Gen NAD   Assessment/Plan: 1. Functional deficits secondary to polytrauma after tree cutting accident including multiple rib fractures, right pneumothorax with chest tube and removed 02/21/2015, right femoral shaft fracture and right pilon fracture status post ORIF-nonweightbearing and right ulnar fracture status post ORIF  which require 3+ hours per day of interdisciplinary therapy in a comprehensive inpatient rehab setting.  Team conference today please see physician documentation under team conference tab, met with team face-to-face to discuss problems,progress, and goals. Formulized individual treatment plan based on medical history, underlying problem and comorbidities. FIM: FIM - Bathing Bathing Steps  Patient Completed: Chest, Right Arm, Left Arm, Abdomen, Front perineal area, Right upper leg, Left upper leg, Left lower leg (including foot) Bathing: 4: Min-Patient completes 8-9 20f 10 parts or 75+ percent  FIM - Upper Body Dressing/Undressing Upper body dressing/undressing steps patient completed: Thread/unthread right sleeve of pullover shirt/dresss, Thread/unthread left sleeve of pullover shirt/dress, Pull shirt over trunk, Put head through opening of pull over shirt/dress Upper body dressing/undressing: 5: Set-up assist to: Obtain clothing/put away FIM - Lower Body Dressing/Undressing Lower body dressing/undressing steps patient completed: Thread/unthread right pants leg, Thread/unthread left pants leg Lower body dressing/undressing: 3: Mod-Patient completed 50-74% of tasks  FIM - Toileting Toileting: 0: Activity did not occur  FIM - Radio producer Devices: Bedside commode (drop arm) Toilet Transfers: 1-Two helpers  FIM - Control and instrumentation engineer Devices: Arm rests, Sliding board Bed/Chair Transfer: 4: Supine > Sit: Min A (steadying Pt. > 75%/lift 1 leg), 4: Sit > Supine: Min A (steadying pt. > 75%/lift 1 leg), 4: Bed > Chair or W/C: Min A (steadying Pt. > 75%), 4: Chair or W/C > Bed: Min A (steadying Pt. > 75%)  FIM - Locomotion: Wheelchair Distance: 130 Locomotion: Wheelchair: 2: Travels 50 - 149 ft with supervision, cueing or coaxing FIM - Locomotion: Ambulation Locomotion: Ambulation Assistive Devices: Engineer, agricultural Ambulation/Gait Assistance: 4: Min assist Locomotion: Ambulation: 0: Activity did not occur  Comprehension Comprehension Mode: Auditory Comprehension: 6-Follows complex conversation/direction: With extra time/assistive device  Expression Expression Mode: Verbal Expression: 5-Expresses basic 90% of the time/requires cueing < 10% of the time.  Social Interaction Social Interaction: 5-Interacts  appropriately 90% of the time - Needs monitoring or encouragement for participation or interaction.  Problem Solving Problem Solving: 5-Solves basic 90% of the time/requires cueing < 10% of the time  Memory Memory: 6-More than reasonable amt of time  1. Functional deficits secondary to polytrauma after tree cutting accident including multiple rib fractures, right pneumothorax with chest tube and removed 02/21/2015, right femoral shaft fracture and right pilon fracture status post ORIF-nonweightbearing and right ulnar fracture status post ORIF weightbearing as tolerated-.should be able to remove sutures right thigh this week 2.  DVT Prophylaxis/Anticoagulation: Has LE DVT on Xarelto, anticipate 3 month tx Monitor for any bleeding episodes, will need med assistance 3. Pain Management: Oxycodone reduce to Q4h prn. Monitor with increased mobility,  4. Acute blood loss anemia. Follow-up CBC, Hgb stable 9.0>9.6>9.2 5. Neuropsych: This patient is  capable of making decisions on his  own behalf.  6. Skin/Wound Care: Routine skin checks  , occiput full thickness abrasions with local care, soap and water, abx cream 7. Fluids/Electrolytes/Nutrition: Routine I/Os with follow-up chemistries 8. Staph tracheobronchitis. Ancef completed. Continue nebulizers as needed for now 9. Insulin-dependent diabetes mellitus. Levemir 10 units twice a day, Glucophage 1000 mg twice a day,tradjenta 5 mg twice a day. Check blood sugars before meals and at bedtime, CBGs in desired range, monitor for hypoglycemia 10. Hypertension. Lisinopril 10 mg daily. Monitor with increased mobility , controlled  11. Constipation. Adjust bowel program,pt reports no BM for several days will need supp today 12.  Left knee pain clinically looks like Quad tendinitis + small partial tear ,correlates with MRI findings  ice, voltaren gel not helping, pt now gives hx that this started with original trauma, Xray- No clinical evidence of gout   urate  level normal, Left knee sleeve, measured, currently using ACE wrap  LOS (Days) 13 A FACE TO FACE EVALUATION WAS PERFORMED  KIRSTEINS,ANDREW E 03/08/2015, 7:38 AM

## 2015-03-08 NOTE — Progress Notes (Signed)
Occupational Therapy Session Note  Patient Details  Name: Jeremy Burgess MRN: [id number] Date of Birth: 21-Mar-1966  Today's Date: 03/08/2015 OT Individual Time: 1000-1110 and 1456- 39 OT Individual Time Calculation (min): 70 min and 54 min    Short Term Goals: Week 1:  OT Short Term Goal 1 (Week 1): Pt will perform toilet transfer to drop arm commode chair with max A of 1 to decrease assistance with functional transfer. OT Short Term Goal 1 - Progress (Week 1): Progressing toward goal OT Short Term Goal 2 (Week 1): Pt will perform LB dressing with max A in order to decrease level of assistance needed with self care. OT Short Term Goal 2 - Progress (Week 1): Met OT Short Term Goal 3 (Week 1): Pt will perform bathing with Mod A in order to decrease level of assistance needed with self care. OT Short Term Goal 3 - Progress (Week 1): Met OT Short Term Goal 4 (Week 1): Pt will perform sit <> stand with assist of 1 person during LB clothing management.  OT Short Term Goal 4 - Progress (Week 1): Progressing toward goal  Skilled Therapeutic Interventions/Progress Updates:  Session 1: Upon entering the room, pt seated in wheelchair with 4/10 pain in L knee this session. Pt tolerated sitting in wheelchair for 25 minutes during this session for UB self care. Pt performing slide board transfer with Min A and assistance to set up slide board. Pt seated in EOB and donned and doffed clothing with lateral leans and min A. Pt returning to supine to wash peri area and OT assisted with buttocks. Min A for R LE to return to supine as well as to roll to L side for bathing. Pt seated on EOB for grooming tasks with set up. Sit >supine with min A for R LE and pt utilizing L foot and B elbows for bed mobility. Pt in bed with call bell and all needed items within reach upon exiting the room.   Session 2: Upon entering the room, pt seated in wheelchair from previous session. Pt with 5/10 pain reported in L knee with  sleeve donned. OT educated pt on lateral scoot transfer from wheelchair <>drop arm commode. Pt performed demonstration with therapist steadying equipment and mod A for lifting. OT educated pt on lateral leans for clothing management while on toilet but pt declined practice this session. Pt performed lateral scoot with mod A back to wheelchair from drop arm. Pt then performed slide board transfer from wheelchair <> standard bed with min A. Pt maintaining weight bearing restrictions for R UE during transfer to the L. Pt continues to require education to not shear bottom on slide board during transfer and to perform forward weight shift. Pt stating, "I can't do that. My ribs hurt if I don't use my R hand." Pt then returning to room for slide board transfer back to bed with min A. Pt utilizing B elbows for bed mobility to slide closer to head of bed. Call bell and all needed items placed within reach.   Therapy Documentation Precautions:  Precautions Precautions: Fall Precaution Comments: Rt rib fractures Restrictions Weight Bearing Restrictions: Yes RUE Weight Bearing: Weight bearing as tolerated RLE Weight Bearing: Non weight bearing Vital Signs: Therapy Vitals BP: (!) 118/58 mmHg  See FIM for current functional status  Therapy/Group: Individual Therapy  [name] 03/08/2015, 11:25 AM

## 2015-03-08 NOTE — Progress Notes (Signed)
Social Work Patient ID: Jeremy Burgess, male   DOB: Jan 07, 1966, 49 y.o.   MRN: [id number] Spoke with pt's brother-Jeremy Burgess to discuss team conference progression toward goals and moving up the discharge date. He reports his wife will be the caregiver while he works and he will assist in the evenings. Discussed his need for a ramp and he is working on this. He will contact this worker to let me know when wife can come in for family education next week. He is aware pt is requiring 24 hr Physical care. Will work on discharge needs and await Jeremy Burgess's return call.

## 2015-03-08 NOTE — Progress Notes (Signed)
Physical Therapy Session Note  Patient Details  Name: Jeremy Burgess MRN: [id number] Date of Birth: 02-Dec-1965  Today's Date: 03/08/2015 PT Individual Time: 0908-1004 PT Individual Time Calculation (min): 56 min   Short Term Goals: Week 2:  PT Short Term Goal 1 (Week 2): Pt will be able to perform bed mobility with mod A on flat surface PT Short Term Goal 2 (Week 2): Pt will be able to perform sit to stands with mod A with PFRW maintaining NWB status PT Short Term Goal 3 (Week 2): Pt will be able to gait with RW x 20' with min A PT Short Term Goal 4 (Week 2): Pt will be able to perform squat pivot transfer with mod A consistently while maintaining NWB status  Skilled Therapeutic Interventions/Progress Updates:   Pt received lying in bed, agreeable to therapy session.  Performed bed mobility with HOB flat and without rails to better simulate home with min/mod A to guide RLE out of bed as well as some assist for rolling to the R to elevate trunk into sitting.  Transferred to w/c via slideboard at S to min/guard level with continued cues for forward weight shift and increased WB through LLE, however he continues to shear buttocks on slideboard.  Once in w/c, requires assist for LE management and w/c parts management.  Pt able to self propel w/c throughout session x 150' with BUEs at S to mod I level.  Once in therapy gym, transferred to therapy mat as above via SB.  Provided continued demonstration and education on how to perform forward weight shift to elevate buttocks, however he is still unable to perform in this manner.  Most of skilled session focused on gait training to increase UE strength, endurance, and tolerance to WB on LLE.  Performed two bouts of gait x 23' x 1 and another 25' x 1 with R PFRW at min A however requires max A to stand from w/c and mod A to stand from elevated mat surface.  During gait, requires cues for upright posture, but do note that he is better able to keep RLE elevated to  maintain NWB.  Continue to educate on importance of family coming in for training and the need for a ramp.  Pt continues to state "I'll talk to them."  CSW made aware in conference.  Also measured w/c and notified pt of how wide to ensure safe entry into home.  Pt propelled back to room and ended session with L leg rest adjustment while pt performed LAQ's x 10 reps on RLE and ending with LLE extended on leg rest while leaning forward to stretch hamstrings and L hip x 1 min.  Left in w/c with all needs in reach.    Therapy Documentation Precautions:  Precautions Precautions: Fall Precaution Comments: Rt rib fractures Restrictions Weight Bearing Restrictions: Yes RUE Weight Bearing: Weight bearing as tolerated RLE Weight Bearing: Non weight bearing   Vital Signs: Therapy Vitals BP: (!) 118/58 mmHg Pain: 3/10 pain in L knee, did not want pain medication at this time.    Locomotion : Ambulation Ambulation/Gait Assistance: 4: Min assist   See FIM for current functional status  Therapy/Group: Individual Therapy  Denice Bors 03/08/2015, 11:20 AM

## 2015-03-08 NOTE — Progress Notes (Signed)
Occupational Therapy Note  Patient Details  Name: Emileo Semel MRN: [id number] Date of Birth: Mar 26, 1966  Today's Date: 03/08/2015 OT Individual Time: 1400-1430 OT Individual Time Calculation (min): 30 min   Pt c/o 3/10 pain in right knee; RN aware and repositioned Individual Therapy  Pt resting in bed upon arrival.  Pt stated he had thrown up earlier but was feeling better and agreeable to therapy.  OT session forcus on bed mobility, sliding board transfers, directing care, discharge planning.  Discharge planning focused on use of BSC, specifically a drop arm BSC.  A drop arm BSC was obtained and placed in room.  Education provided on use of BSC with sliding board but time not available to practice.  Pt performed bed->sliding board transfer with set up and assistance to hold sliding board to prevent it from slipping.   [facility] 03/08/2015, 2:45 PM
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
## 2015-03-09 ENCOUNTER — Inpatient Hospital Stay (HOSPITAL_COMMUNITY): Payer: MEDICAID | Admitting: Occupational Therapy

## 2015-03-09 ENCOUNTER — Inpatient Hospital Stay (HOSPITAL_COMMUNITY): Payer: MEDICAID | Admitting: Physical Therapy

## 2015-03-09 ENCOUNTER — Inpatient Hospital Stay (HOSPITAL_COMMUNITY): Payer: Self-pay

## 2015-03-09 DIAGNOSIS — M79609 Pain in unspecified limb: Secondary | ICD-10-CM | POA: Insufficient documentation

## 2015-03-09 DIAGNOSIS — M79606 Pain in leg, unspecified: Secondary | ICD-10-CM

## 2015-03-09 DIAGNOSIS — M25569 Pain in unspecified knee: Secondary | ICD-10-CM | POA: Insufficient documentation

## 2015-03-09 LAB — CREATININE, SERUM
CREATININE: 1.01 mg/dL (ref 0.61–1.24)
GFR calc Af Amer: >60 mL/min (ref >60)
GFR calc non Af Amer: >60 mL/min (ref >60)

## 2015-03-09 LAB — GLUCOSE, CAPILLARY
GLUCOSE-CAPILLARY: 86 mg/dL (ref 65–99)
Glucose-Capillary: 87 mg/dL (ref 65–99)
Glucose-Capillary: 131 mg/dL — ABNORMAL HIGH (ref 65–99)
Glucose-Capillary: 113 mg/dL — ABNORMAL HIGH (ref 65–99)
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
## 2015-03-09 NOTE — Progress Notes (Signed)
Occupational Therapy Session Note  Patient Details  Name: Jeremy Burgess MRN: [id number] Date of Birth: [birth year]  Today's Date: 03/09/2015 OT Individual Time: 6237-6283  OT Individual Time Calculation (min): 75 min    Short Term Goals: Week 2:  OT Short Term Goal 1 (Week 2): Pt will perfrom LB clothing management with assist of 1 person during sit <>stand.  OT Short Term Goal 2 (Week 2): Pt will perform toilet transfer with assist of 1 person in order to decrease level of assist with functional transfer.  OT Short Term Goal 3 (Week 2): Pt will perform toileting with assist of 1 person in order to decrease level of assist for self care. OT Short Term Goal 4 (Week 2): Pt will perfrom shower transfer onto TTB with max A in order to decrease level of assist with functional transfer.   Skilled Therapeutic Interventions/Progress Updates:    Session 1: Upon entering the room, pt supine in bed with 3/10 c/o pain in L knee this session. Skilled OT intervention with focus on functional transfers, self care retraining, functional ambulation, safety, and pt education. Pt engaged in bathing from shower level this session. Pt ambulating with platform RW and Min A 20' for mod A transfer onto TTB. Pt engaging in bathing at shower level with therapist introducing long handled sponge in order to increase independence with LB bathing. Elastic waist shorts donned while seated on edge of TTB with max A and use of reacher to thread pants. Pt performed ambulation of 10' secondary to fatigue in same manner as stated above. Pt returning to sit into wheelchair to complete grooming tasks with set up A. Slide board transfer wheelchair >bed with min A secondary to pt refusal to continue sitting in chair. Pt utilizing B elbows for bed mobility. Call bell and all needed items within reach upon exiting the room.     Therapy Documentation Precautions:  Precautions Precautions: Fall Precaution Comments: Rt rib  fractures Restrictions Weight Bearing Restrictions: Yes RUE Weight Bearing: Weight bearing as tolerated RLE Weight Bearing: Non weight bearing General:   Vital Signs: Therapy Vitals Temp: 97.9 F (36.6 C) Temp Source: Oral Pulse Rate: 76 Resp: 18 BP: 121/68 mmHg Patient Position (if appropriate): Lying Oxygen Therapy SpO2: 97 % O2 Device: Not Delivered  See FIM for current functional status  Therapy/Group: Individual Therapy  [name] 03/09/2015, 9:20 AM

## 2015-03-09 NOTE — Progress Notes (Signed)
Physical Therapy Session Note  Patient Details  Name: Jeremy Burgess MRN: [id number] Date of Birth: [birth year]  Today's Date: 03/09/2015 PT Individual Time: 1405-1500 PT Individual Time Calculation (min): 55 min   Short Term Goals: Week 2:  PT Short Term Goal 1 (Week 2): Pt will be able to perform bed mobility with mod A on flat surface PT Short Term Goal 2 (Week 2): Pt will be able to perform sit to stands with mod A with PFRW maintaining NWB status PT Short Term Goal 3 (Week 2): Pt will be able to gait with RW x 20' with min A PT Short Term Goal 4 (Week 2): Pt will be able to perform squat pivot transfer with mod A consistently while maintaining NWB status  Skilled Therapeutic Interventions/Progress Updates:   Session focused on transfers, gait, muscle extensibility, and activity tolerance. Patient transferred supine > sit with min A to guide RLE out of bed and performed slide board transfers bed > wheelchair <> mat table with patient directing setup of transfer, supervision overall and verbal/demonstration cues for head hips relationship, anterior weight shift, and technique to decrease shearing of buttocks across board. Patient resistant to all cues from therapist. Patient propelled wheelchair to/from gym using BUE (L arm rest removed) with mod I. Sit <> stand from slightly elevated mat table with min A overall, cues to maintain RLE NWB. Gait using R PFRW 2 x 30 ft with min A. Therapist discussed with patient raising platform to promote upright posture due to forward flexion during gait, however, patient declined adjustment. Patient c/o RLE tightness. Seated edge of mat, seated hamstring stretch with R foot propped on step/pillows with forward lean, 3 x 30 sec. Reciprocal scooting back on mat with min A to unweight RLE. Sidelying passive R hip flexor and quadriceps stretch 3 x 30 sec each. Patient left sitting in wheelchair with all needs within reach.   Therapy Documentation Precautions:   Precautions Precautions: Fall Precaution Comments: Rt rib fractures Restrictions Weight Bearing Restrictions: Yes RUE Weight Bearing: Weight bearing as tolerated RLE Weight Bearing: Non weight bearing Pain: Pain Assessment Pain Assessment: 0-10 Pain Score: 3  Pain Type: Acute pain Pain Location: Knee Pain Orientation: Left Pain Descriptors / Indicators: Aching Pain Onset: On-going Pain Intervention(s): Repositioned;Emotional support;Rest   See FIM for current functional status  Therapy/Group: Individual Therapy  Laretta Alstrom 03/09/2015, 2:57 PM

## 2015-03-09 NOTE — Progress Notes (Signed)
Occupational Therapy Session Note  Patient Details  Name: Jeremy Burgess MRN: [id number] Date of Birth: [birth year]  Today's Date: 03/09/2015 OT Individual Time: 1941-7408 OT Individual Time Calculation (min): 53 min    Short Term Goals: Week 2:  OT Short Term Goal 1 (Week 2): Pt will perfrom LB clothing management with assist of 1 person during sit <>stand.  OT Short Term Goal 2 (Week 2): Pt will perform toilet transfer with assist of 1 person in order to decrease level of assist with functional transfer.  OT Short Term Goal 3 (Week 2): Pt will perform toileting with assist of 1 person in order to decrease level of assist for self care. OT Short Term Goal 4 (Week 2): Pt will perfrom shower transfer onto TTB with max A in order to decrease level of assist with functional transfer.   Skilled Therapeutic Interventions/Progress Updates:    Pt seen for 1:1 OT session with a focus on functional transfers, home management skills, use of AE, safety awareness, and functional endurance. Pt received seated in w/c following PT session. Pt stating pain 3/10 in R knee upon therapist arrival. Pt requesting to complete laundry task due to fatigue level and utilized reacher to retrieve items from low dryer surface. Pt propelled w/c 60' to ADL apt to participate in functional transfer. Pt transferred w/c>EOB via SB with close supervision and min cues for head-hips relationship to prevent shearing. Pt completed sit>supine with supervision and supine>sit with min A due to RLE management. Pt transferred EOB>w/c via SB with close supervision. Pt then propelled w/c back to room 50' with supervision. Pt requesting to return to bed following session and completed transfer w/c>bed via SB with SBA. Pt demonstrates appropriate safety awareness and ability to maintain precautions during transfer to L side. However, pt will continue to benefit from education for "lifting" during SB transfer to prevent shearing. Pt declines to  practice transfer to R side, stating that in all situations there will be room to transfer to L. Pt left supine in bed with call bell and all other needs in reach.   Therapy Documentation Precautions:  Precautions Precautions: Fall Precaution Comments: Rt rib fractures Restrictions Weight Bearing Restrictions: Yes RUE Weight Bearing: Weight bearing as tolerated RLE Weight Bearing: Non weight bearing General:   Vital Signs: Therapy Vitals Temp: 98.7 F (37.1 C) Temp Source: Oral Pulse Rate: (!) 104 Resp: 20 BP: 107/65 mmHg Patient Position (if appropriate): Sitting Oxygen Therapy SpO2: 95 % O2 Device: Not Delivered Pain: Pain Assessment Pain Assessment: 0-10 Pain Score: 3  Pain Type: Acute pain Pain Location: Knee Pain Orientation: Left Pain Descriptors / Indicators: Aching Pain Onset: On-going Pain Intervention(s): Repositioned;Emotional support;Rest ADL:   Exercises:   Other Treatments:    See FIM for current functional status  Therapy/Group: Individual Therapy  Dorann Ou 03/09/2015, 4:07 PM

## 2015-03-10 ENCOUNTER — Inpatient Hospital Stay (HOSPITAL_COMMUNITY): Payer: MEDICAID | Admitting: Occupational Therapy

## 2015-03-10 ENCOUNTER — Inpatient Hospital Stay (HOSPITAL_COMMUNITY): Payer: MEDICAID | Admitting: Physical Therapy

## 2015-03-10 LAB — GLUCOSE, CAPILLARY
GLUCOSE-CAPILLARY: 88 mg/dL (ref 65–99)
GLUCOSE-CAPILLARY: 105 mg/dL — AB (ref 65–99)
GLUCOSE-CAPILLARY: 117 mg/dL — AB (ref 65–99)
Glucose-Capillary: 100 mg/dL — ABNORMAL HIGH (ref 65–99)

## 2015-03-10 NOTE — Progress Notes (Signed)
Social Work Patient ID: Jeremy Burgess, male   DOB: [birth year], 49 y.o.   MRN: [id number] Met with pt and spoke with his sister in-law-[name] via telephone to schedule family education on Monday at 9;00-11;00. Will see how it goes, she is aware Pt will require 24 hr care at discharge and she is willing to provide this. See how Monday goes.

## 2015-03-10 NOTE — Progress Notes (Signed)
Occupational Therapy Session Note  Patient Details  Name: Jeremy Burgess MRN: [id number] Date of Birth: Jan 30, 1966  Today's Date: 03/10/2015 OT Individual Time: 2458-0998 OT Individual Time Calculation (min): 84 min    Short Term Goals: Week 2:  OT Short Term Goal 1 (Week 2): Pt will perfrom LB clothing management with assist of 1 person during sit <>stand.  OT Short Term Goal 2 (Week 2): Pt will perform toilet transfer with assist of 1 person in order to decrease level of assist with functional transfer.  OT Short Term Goal 3 (Week 2): Pt will perform toileting with assist of 1 person in order to decrease level of assist for self care. OT Short Term Goal 4 (Week 2): Pt will perfrom shower transfer onto TTB with max A in order to decrease level of assist with functional transfer.   Skilled Therapeutic Interventions/Progress Updates:  Upon entering the room, pt supine in bed with 4/10 pain in L knee. RN notified and provided medications during therapy session. Pt declined shower this session and reports he did not sleep well last night. Supine >sit to EOB with min A for R LE. Pt seated on EOB for bathing and dressing tasks. Pt did return to supine for LB clothing management and to wash buttocks. Pt rolls to L and R with min A for R LE during task. Pt pulling elastic waist shorts up over hips by rolling side to side this session. Pt engaged in grooming while seated on EOB with set up assist. Pt returning to supine at end of session secondary to pt refusal to to sit in wheelchair until next therapy session. Pt supine in bed with call bell and all needed items within reach.  Therapy Documentation Precautions:  Precautions Precautions: Fall Precaution Comments: Rt rib fractures Restrictions Weight Bearing Restrictions: Yes RUE Weight Bearing: Weight bearing as tolerated RLE Weight Bearing: Non weight bearing General:   Vital Signs: Therapy Vitals Temp: 98.4 F (36.9 C) Temp Source:  Oral Pulse Rate: 86 Resp: 18 BP: 134/66 mmHg Patient Position (if appropriate): Lying Oxygen Therapy SpO2: 97 % O2 Device: Not Delivered  See FIM for current functional status  Therapy/Group: Individual Therapy  [name] 03/10/2015, 9:45 AM

## 2015-03-10 NOTE — Progress Notes (Addendum)
Physical Therapy Session Note  Patient Details  Name: Jeremy Burgess MRN: [id number] Date of Birth: 07-13-1966  Today's Date: 03/10/2015 PT Individual Time: 1103-1204 PT Individual Time Calculation (min): 61 min   Short Term Goals: Week 2:  PT Short Term Goal 1 (Week 2): Pt will be able to perform bed mobility with mod A on flat surface PT Short Term Goal 1 - Progress (Week 2): Met PT Short Term Goal 2 (Week 2): Pt will be able to perform sit to stands with mod A with PFRW maintaining NWB status PT Short Term Goal 2 - Progress (Week 2): Progressing toward goal PT Short Term Goal 3 (Week 2): Pt will be able to gait with RW x 20' with min A PT Short Term Goal 3 - Progress (Week 2): Met PT Short Term Goal 4 (Week 2): Pt will be able to perform squat pivot transfer with mod A consistently while maintaining NWB status PT Short Term Goal 4 - Progress (Week 2): Met  Skilled Therapeutic Interventions/Progress Updates:    Gait Training: PT instructed patient in ambulation x40 feet with PFRW (R elbow WBAT, RLE NWB). Pt required initial min A (x30') progressing to mod A (x10') 2/2 fatigue. PT provided verbal cues for walker placement, sequencing, foot placement, and maintaining weight bearing restrictions. Pt able to maintain RLE NWB during ambulation. Pt amb with rounded/elevated shoulders, and heavily relies on weight bearing through R elbow to advance RLE during gait. Discussed advancing walker farther forward to keep COG over BOS during gait.   Therapeutic Activity: Pt performed sit<>stand transfers from bed, w/c, and car with supervision from elevated surfaces and supervision/min A from low surfaces. PT instructed pt in car transfer x1 with slide board and x1 with [place] stand-pivot transfer. Pt provided instruction to PT for teach back/self directed are for car transfer using sliding board. Pt then demonstrated car transfer to/from w/c using sliding board with supervision for safe technique and verbal  cues as needed for sequencing. PT instructed patient in car transfer to/from w/c using [place] for stand-pivot. Pt demonstrated transfer with supervision for safe transfer technique.   W/C Management: PT instructed patient in w/c propulsion x110 feet and x150 to/from gym using BUEs with verbal cues for decreased push on RUE to adhere to weight bearing precautions. Pt demonstrated teach-back of instructions for w/c management for swing away arm rests and removable leg-rests.    Assessed functional reach in sitting (forward reach with LUE 17", L reach with LUE 12", R reach with RUE 11.5"). Would benefit from re-trial in standing as norms are 7" for each direction in sitting.   Pt somewhat limited by pain in L knee but continues to make slow progress towards goals. Continues to demonstrate deficits in functional mobility independence and activity tolerance. Would benefit from continued skilled PT for progression of independence with bed mobility, transfers and gait, and progression of endurance/activity tolerance with increased locomotion distance (gait and w/c) and tolerance for activity with decreased number/time of rest breaks.    Therapy Documentation Precautions:  Precautions Precautions: Fall Precaution Comments: Rt rib fractures Restrictions Weight Bearing Restrictions: Yes RUE Weight Bearing: Weight bearing as tolerated RLE Weight Bearing: Non weight bearing  Pain: Pain Assessment Pain Assessment: 0-10 Pain Score: 3  (L knee) Pain Location: Knee Pain Orientation: Left Pain Intervention(s): Emotional support;Rest  Locomotion : Ambulation Ambulation/Gait Assistance: 4: Min assist;3: Mod assist (mod A for 10' out of 40' 2/2 fatigue) Wheelchair Mobility Distance: 150   See FIM for  current functional status  Therapy/Group: Individual Therapy  Earnest Conroy Penven-Crew 03/10/2015, 12:46 PM

## 2015-03-10 NOTE — Progress Notes (Signed)
Occupational Therapy Session Note  Patient Details  Name: Jeremy Burgess MRN: [id number] Date of Birth: [birth year]  Today's Date: 03/10/2015 OT Individual Time: 1430-1530 OT Individual Time Calculation (min): 60 min    Skilled Therapeutic Interventions/Progress Updates:    Pt practiced drop arm commode transfers from EOB to begin session.  Min assist to complete transfers scooting laterally.  Once sitting on the seat had pt work on lateral leans side to side for clothing management.  He was able to complete with supervision.  Transferred back to EOB same method and then to wheelchair scoot pivot as well.  Pt was able propel his wheelchair with supervision to the ADL apartment.  Performed simulated shower transfers to the walk-in shower with mod assist using the RW stand pivot.  Pt still needing mod assist for sit to stand from the wheelchair.  Noted pt not following NWBing status during sit to stand but once standing he can maintain for mobility.  While in the apartment pt practiced bed transfer on the left side.  He needed min assist for lifting the RLE into and out of the bed but could perform all other aspects with supervision.  Mod assist for sit to stand from lower soft bed.  Pt ambulated out the door and then transferred back to the wheelchair just outside of the door secondary to fatigue.  He propelled himself back to the room with supervision and transferred back to the bed with min assist, using the sliding board per request this time.  All items within reach including call button and phone.   Therapy Documentation Precautions:  Precautions Precautions: Fall Precaution Comments: Rt rib fractures Restrictions Weight Bearing Restrictions: Yes RUE Weight Bearing: Weight bearing as tolerated RLE Weight Bearing: Non weight bearing  Vital Signs: Therapy Vitals Temp: 98.3 F (36.8 C) Temp Source: Oral Pulse Rate: 84 Resp: 18 BP: (!) 128/58 mmHg Patient Position (if appropriate):  Lying Oxygen Therapy SpO2: 100 % O2 Device: Not Delivered Pain: Pain Assessment Pain Score: 3  Pain Type: Acute pain Pain Location: Knee Pain Orientation: Left Pain Intervention(s): Repositioned ADL: See FIM for current functional status  Therapy/Group: Individual Therapy  Elliemae Braman OTR/L 03/10/2015, 4:26 PM

## 2015-03-10 NOTE — Plan of Care (Signed)
Problem: RH Car Transfers Goal: LTG Patient will perform car transfers with assist (PT) LTG: Patient will perform car transfers with assistance (PT).  Downgraded due to lack of progress with uneven surfaces.   Problem: RH Furniture Transfers Goal: LTG Patient will perform furniture transfers w/assist (OT/PT LTG: Patient will perform furniture transfers with assistance (OT/PT).  Downgraded due to lack of progress with uneven surfaces.   Problem: RH Ambulation Goal: LTG Patient will ambulate in home environment (PT) LTG: Patient will ambulate in home environment, # of feet with assistance (PT).  Outcome: Not Applicable Date Met:  91/47/82 D/C goal due to pt will be at w/c level at home.

## 2015-03-10 NOTE — Plan of Care (Signed)
Problem: RH Wheelchair Mobility Goal: LTG Patient will propel w/c in home environment (PT) LTG: Patient will propel wheelchair in home environment, # of feet with assistance (PT). Goal added 03/10/15

## 2015-03-10 NOTE — Progress Notes (Signed)
Physical Therapy Weekly Progress Note  Patient Details  Name: Jeremy Burgess MRN: [id number] Date of Birth: [birth year]  Beginning of progress report period: March 02, 2015 End of progress report period: March 10, 2015   Patient has met 3 of 4 short term goals.  Pt continues to make very slow progress with all aspects of mobility this reporting period.  Continue to educate on skin breakdown and importance of elevating buttocks during transfer, however he is unable to complete in this manner and states "I can't do it like that."  Also continue to educate on importance of having family come in for training as well as ensuring that ramp built by the time he D/C's as he will be at w/c level at home.  Continues to require max A for sit<>stand from w/c.    Patient continues to demonstrate the following deficits: decreased strength, decreased mobility, WB status, decreased safety awareness and therefore will continue to benefit from skilled PT intervention to enhance overall performance with activity tolerance, balance, ability to compensate for deficits and knowledge of precautions.  Patient not progressing toward long term goals.  See goal revision..  Plan of care revisions: D/C'd home ambulation goal, downgraded car transfer to mod A level due to uneven surface.  PT Short Term Goals Week 2:  PT Short Term Goal 1 (Week 2): Pt will be able to perform bed mobility with mod A on flat surface PT Short Term Goal 1 - Progress (Week 2): Met PT Short Term Goal 2 (Week 2): Pt will be able to perform sit to stands with mod A with PFRW maintaining NWB status PT Short Term Goal 2 - Progress (Week 2): Progressing toward goal PT Short Term Goal 3 (Week 2): Pt will be able to gait with RW x 20' with min A PT Short Term Goal 3 - Progress (Week 2): Met PT Short Term Goal 4 (Week 2): Pt will be able to perform squat pivot transfer with mod A consistently while maintaining NWB status PT Short Term Goal 4 - Progress (Week  2): Met Week 3:  PT Short Term Goal 1 (Week 3): =LTG's due to ELOS  Skilled Therapeutic Interventions/Progress Updates:   See previous notes.     Denice Bors 03/10/2015, 7:40 AM
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
## 2015-03-11 ENCOUNTER — Inpatient Hospital Stay (HOSPITAL_COMMUNITY): Payer: MEDICAID | Admitting: Occupational Therapy

## 2015-03-11 DIAGNOSIS — S2241XD Multiple fractures of ribs, right side, subsequent encounter for fracture with routine healing: Secondary | ICD-10-CM

## 2015-03-11 LAB — CBC
HEMATOCRIT: 29.6 % — AB (ref 39.0–52.0)
HEMOGLOBIN: 9.4 g/dL — AB (ref 13.0–17.0)
MCH: 26.6 pg (ref 26.0–34.0)
MCHC: 31.8 g/dL (ref 30.0–36.0)
MCV: 83.6 fL (ref 78.0–100.0)
Platelets: 338 10*3/uL (ref 150–400)
RBC: 3.54 MIL/uL — ABNORMAL LOW (ref 4.22–5.81)
RDW: 14 % (ref 11.5–15.5)
WBC: 9.4 10*3/uL (ref 4.0–10.5)

## 2015-03-11 LAB — GLUCOSE, CAPILLARY
GLUCOSE-CAPILLARY: 106 mg/dL — AB (ref 65–99)
GLUCOSE-CAPILLARY: 95 mg/dL (ref 65–99)
Glucose-Capillary: 87 mg/dL (ref 65–99)
Glucose-Capillary: 105 mg/dL — ABNORMAL HIGH (ref 65–99)
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
## 2015-03-11 NOTE — Progress Notes (Signed)
Occupational Therapy Weekly Progress Note  Patient Details  Name: Jeremy Burgess MRN: [id number] Date of Birth: 13-Jul-1966  Beginning of progress report period: March 03, 2015 End of progress report period: March 11, 2015  Today's Date: 03/11/2015 OT Individual Time: 5188-4166 OT Individual Time Calculation (min): 57 min    Patient has met 4 of 4 short term goals. Pt has made steady progress this week towards OT goals. Pt requiring assistance of 1 person this week and has been able to ambulate short distance with platform walker for shower while sitting onto TTB. Pt is maintaining weight bearing restrictions with min verbal cues at times for R UE elbow only secondary to when pt fatigues he attempts to use more. Education has begun with lateral scoot transfer onto drop arm commode chair for toileting. Pt has decreased pain from last week and continues to benefit from OT intervention.   Patient continues to demonstrate the following deficits: decreased self care, decreased balance, decreased functional transfers, decreased strength and endurance, increased pain, and decreased safety awareness and therefore will continue to benefit from skilled OT intervention to enhance overall performance with BADL.  Patient progressing toward long term goals..  Continue plan of care.  OT Short Term Goals Week 2:  OT Short Term Goal 1 (Week 2): Pt will perfrom LB clothing management with assist of 1 person during sit <>stand.  OT Short Term Goal 1 - Progress (Week 2): Met OT Short Term Goal 2 (Week 2): Pt will perform toilet transfer with assist of 1 person in order to decrease level of assist with functional transfer.  OT Short Term Goal 2 - Progress (Week 2): Met OT Short Term Goal 3 (Week 2): Pt will perform toileting with assist of 1 person in order to decrease level of assist for self care. OT Short Term Goal 3 - Progress (Week 2): Met OT Short Term Goal 4 (Week 2): Pt will perfrom shower transfer onto TTB  with max A in order to decrease level of assist with functional transfer.  OT Short Term Goal 4 - Progress (Week 2): Met Week 3:  OT Short Term Goal 1 (Week 3): STGs=LTGs secondary to upcoming discharge  Skilled Therapeutic Interventions/Progress Updates:  Upon entering the room, pt supine in bed with 3/10 c/o pain in L knee during session. Pt declined shower this session but agreeable to washing while seated EOB. Pt performed supine <>sit with min A this session for R LE. Pt seated on EOB with supervision for dressing and bathing. Pt performing lateral leans to don and doff elastic waist shorts this supervision assistance and while maintaining weight bearing restrictions. Pt refused sit <> stand as well as sitting in wheelchair secondary to reported pain. Once self care completed, pt returned to supine position and call bell and all needed items placed within reach.    Therapy Documentation Precautions:  Precautions Precautions: Fall Precaution Comments: Rt rib fractures Restrictions Weight Bearing Restrictions: Yes RUE Weight Bearing: Weight bearing as tolerated RLE Weight Bearing: Non weight bearing Vital Signs: Therapy Vitals Temp: 98.3 F (36.8 C) Temp Source: Oral Pulse Rate: 69 Resp: 18 BP: (!) 120/57 mmHg Patient Position (if appropriate): Lying Oxygen Therapy SpO2: 97 % O2 Device: Not Delivered  See FIM for current functional status  Therapy/Group: Individual Therapy  [name] 03/11/2015, 7:45 AM

## 2015-03-12 ENCOUNTER — Inpatient Hospital Stay (HOSPITAL_COMMUNITY): Payer: MEDICAID | Admitting: Occupational Therapy

## 2015-03-12 LAB — GLUCOSE, CAPILLARY
GLUCOSE-CAPILLARY: 104 mg/dL — AB (ref 65–99)
GLUCOSE-CAPILLARY: 98 mg/dL (ref 65–99)
Glucose-Capillary: 96 mg/dL (ref 65–99)

## 2015-03-12 NOTE — Progress Notes (Signed)
Occupational Therapy Session Note  Patient Details  Name: Jeremy Burgess MRN: [id number] Date of Birth: Apr 22, 1966  Today's Date: 03/12/2015 OT Individual Time: 1300-1345 OT Individual Time Calculation (min): 45 min    Short Term Goals: Week 1:  OT Short Term Goal 1 (Week 1): Pt will perform toilet transfer to drop arm commode chair with max A of 1 to decrease assistance with functional transfer. OT Short Term Goal 1 - Progress (Week 1): Progressing toward goal OT Short Term Goal 2 (Week 1): Pt will perform LB dressing with max A in order to decrease level of assistance needed with self care. OT Short Term Goal 2 - Progress (Week 1): Met OT Short Term Goal 3 (Week 1): Pt will perform bathing with Mod A in order to decrease level of assistance needed with self care. OT Short Term Goal 3 - Progress (Week 1): Met OT Short Term Goal 4 (Week 1): Pt will perform sit <> stand with assist of 1 person during LB clothing management.  OT Short Term Goal 4 - Progress (Week 1): Progressing toward goal Week 2:  OT Short Term Goal 1 (Week 2): Pt will perfrom LB clothing management with assist of 1 person during sit <>stand.  OT Short Term Goal 1 - Progress (Week 2): Met OT Short Term Goal 2 (Week 2): Pt will perform toilet transfer with assist of 1 person in order to decrease level of assist with functional transfer.  OT Short Term Goal 2 - Progress (Week 2): Met OT Short Term Goal 3 (Week 2): Pt will perform toileting with assist of 1 person in order to decrease level of assist for self care. OT Short Term Goal 3 - Progress (Week 2): Met OT Short Term Goal 4 (Week 2): Pt will perfrom shower transfer onto TTB with max A in order to decrease level of assist with functional transfer.  OT Short Term Goal 4 - Progress (Week 2): Met Week 3:  OT Short Term Goal 1 (Week 3): STGs=LTGs secondary to upcoming discharge  Skilled Therapeutic Interventions/Progress Updates:    Upon entering the room, pt supine in bed  with 3/10 c/o pain in L knee during session. Pt. Went from supine to EOB with bed control and rail with SBA; sit to stand to use platform walker with min assist.  Ambulated about 30 feet with min assist plus wc behind.  Pt. Propelled wc to laundry area.  Unable to get clothes out of washer, but could toss in dryer.  Pt returned to room in wc and transferred to bed and supine position and call bell and all needed items placed within reach.    Therapy Documentation Precautions:  Precautions Precautions: Fall Precaution Comments: Rt rib fractures Restrictions Weight Bearing Restrictions: Yes RUE Weight Bearing: Weight bearing as tolerated RLE Weight Bearing: Non weight bearing      Pain: 3/10 Left knee Pain Assessment Pain Assessment: 0-10 Pain Score: 2  Pain Type: Acute pain Pain Location: Back Pain Orientation: Lower Pain Descriptors / Indicators: Aching Pain Onset: Gradual Pain Intervention(s): Medication (See eMAR)          See FIM for current functional status  Therapy/Group: Individual Therapy  Lisa Roca 03/12/2015, 6:56 PM
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
## 2015-03-13 ENCOUNTER — Inpatient Hospital Stay (HOSPITAL_COMMUNITY): Payer: MEDICAID

## 2015-03-13 DIAGNOSIS — S52291S Other fracture of shaft of right ulna, sequela: Secondary | ICD-10-CM

## 2015-03-13 DIAGNOSIS — S82871S Displaced pilon fracture of right tibia, sequela: Secondary | ICD-10-CM

## 2015-03-13 DIAGNOSIS — S7291XS Unspecified fracture of right femur, sequela: Secondary | ICD-10-CM

## 2015-03-13 LAB — GLUCOSE, CAPILLARY
Glucose-Capillary: 102 mg/dL — ABNORMAL HIGH (ref 65–99)
Glucose-Capillary: 96 mg/dL (ref 65–99)
Glucose-Capillary: 143 mg/dL — ABNORMAL HIGH (ref 65–99)
Glucose-Capillary: 99 mg/dL (ref 65–99)
Glucose-Capillary: 99 mg/dL (ref 65–99)

## 2015-03-13 NOTE — Plan of Care (Signed)
Problem: RH Wheelchair Mobility Goal: LTG Patient will propel w/c in home environment (PT) LTG: Patient will propel wheelchair in home environment, # of feet with assistance (PT).  D/c due to not appropriate focus at this time. Family likely to push pt due to limitations in RUE over uneven surfaces/hills. ABG

## 2015-03-13 NOTE — Progress Notes (Signed)
Physical Therapy Session Note  Patient Details  Name: Jeremy Burgess MRN: [id number] Date of Birth: 09/09/1965  Today's Date: 03/13/2015 PT Individual Time: 1030-1139 PT Individual Time Calculation (min): 69 min   Short Term Goals: Week 2:  PT Short Term Goal 1 (Week 2): Pt will be able to perform bed mobility with mod A on flat surface PT Short Term Goal 1 - Progress (Week 2): Met PT Short Term Goal 2 (Week 2): Pt will be able to perform sit to stands with mod A with PFRW maintaining NWB status PT Short Term Goal 2 - Progress (Week 2): Progressing toward goal PT Short Term Goal 3 (Week 2): Pt will be able to gait with RW x 20' with min A PT Short Term Goal 3 - Progress (Week 2): Met PT Short Term Goal 4 (Week 2): Pt will be able to perform squat pivot transfer with mod A consistently while maintaining NWB status PT Short Term Goal 4 - Progress (Week 2): Met Week 3:  PT Short Term Goal 1 (Week 3): =LTG's due to ELOS  Skilled Therapeutic Interventions/Progress Updates:   Session focused on family education with pt's sister in law, Jeremy Burgess, and focus on d/c planning (home set-up, equipment, recommendations, and hands on return demonstration). Pt limited during session due to back pain and fatigue stating he has already done too much this morning.  Pt and sister in law refused to practice real car transfer due to it being too far away, so only performed simulated car transfer. Both report that this was enough and do not feel that there will be an issue. Educated on the differences between the real vs. Simulated car and suggested moving seat back to increase leg room to aid with legs in/out of vehicle. Educated on set-up and breakdown of w/c parts including return demonstration of legrest management as well as PFRW. Pt declined attempting car transfer with slideboard in case of increased L knee pain returning, so only performed with PFRW at S level for transfers and short distance gait to/from car and  min A to manage RLE in and out of the car.  Pt propelled w/c on unit at mod I level and educated family on watching for limited use of pushing with RUE. Jeremy Burgess states that the ramp will be done by Thursday (d/c date) but this therapist discussed option to bump up w/c if needed to let us know to demonstrate but she is confident it will be completed. Discussed equipment needs and notified CSW of these needs (18x18 w/c with basic cushion and basic back and elevating legrests, 30" slideboard, and R PFRW) as well as f/u therapy recommendations for HHPT.   Handout for LE HEP given and reviewed with pt and sister in law with pt return demonstrating heel slides, hip abduction, SAQ, and quad sets x 15 reps each and therapist verbalized and demonstrated seated and standing exercises as pt requested to return back to bed. Sister in law signed off on Safety Plan for bed <-> w/c transfers with slideboard or PFRW. Pt and family deny concerns in regards to d/c on Thursday. Continue to address functional transfers, gait, LE strengthening, and balance to prepare for d/c home.   Pt missed 21 min of session due to pain and fatigue from all therapy this AM.  Therapy Documentation Precautions:  Precautions Precautions: Fall Precaution Comments: Rt rib fractures Restrictions Weight Bearing Restrictions: Yes RUE Weight Bearing: Weight bearing as tolerated RLE Weight Bearing: Non weight bearing General: PT Amount  of Missed Time (min): 21 Minutes PT Missed Treatment Reason: Patient fatigue Pain: C/o back pain and overall fatigue/soreness from therapy session this AM - pt did not rate. Reports already having medication. Discussed with RN about possibility of changing out of air mattress as pt complaining that that is hurting his back. Pt unable to complete session due to pain and fatigue.  Locomotion : Ambulation Ambulation/Gait Assistance: 5: Supervision   See FIM for current functional status  Therapy/Group:  Individual Therapy  [name], PT, DPT  03/13/2015, 12:02 PM
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
## 2015-03-13 NOTE — Progress Notes (Signed)
Social Work Patient ID: Jeremy Burgess, male   DOB: 1965-09-22, 49 y.o.   MRN: [id number] Met with pt and sister in-law-[name] who was here for family education, both report it went well and she feels she can provide the care pt needs. Pt is tired from having back to back therapies today, can rest this afternoon. Discussed equipment needs and follow up needs. All in agreement with. Will order DME and work toward discharge Thursday.

## 2015-03-13 NOTE — Progress Notes (Signed)
Occupational Therapy Session Note  Patient Details  Name: Jeremy Burgess MRN: [id number] Date of Birth: 02/05/1966  Today's Date: 03/13/2015 OT Individual Time: 0900-1030 OT Individual Time Calculation (min): 90 min    Short Term Goals: Week 2:  OT Short Term Goal 1 (Week 2): Pt will perfrom LB clothing management with assist of 1 person during sit <>stand.  OT Short Term Goal 1 - Progress (Week 2): Met OT Short Term Goal 2 (Week 2): Pt will perform toilet transfer with assist of 1 person in order to decrease level of assist with functional transfer.  OT Short Term Goal 2 - Progress (Week 2): Met OT Short Term Goal 3 (Week 2): Pt will perform toileting with assist of 1 person in order to decrease level of assist for self care. OT Short Term Goal 3 - Progress (Week 2): Met OT Short Term Goal 4 (Week 2): Pt will perfrom shower transfer onto TTB with max A in order to decrease level of assist with functional transfer.  OT Short Term Goal 4 - Progress (Week 2): Met  Skilled Therapeutic Interventions/Progress Updates:    Pt seen for 1:1 OT session with focus on ADL retraining, family education/training, functional transfers, functional mobility, and activity tolerance. Pt received supine in bed with sister-in-law Elmyra Ricks present. Educated on role of OT, goals of therapy, weight bearing restrictions, and CLOF. Pt requesting to sponge bathe this AM. Elmyra Ricks, caregiver, left room during bathing and dressing as she reported another family member will be assisting with this. Pt completed bathing with min A overall for buttocks hygiene sit<>stand level with PFRW. Pt assisted with 50% of buttocks hygiene, requiring therapist to assist for thoroughness. Completed LB dressing with increased time and setup assist using reacher and lateral lean technique. Elmyra Ricks returned to room and discussed setup of bathroom at home. Pt and caregiver unsure if w/c will fit into bathroom therefore practiced lateral scoot transfer  bed<>BSC with min-SBA. Elmyra Ricks returned demonstration of transfer following therapist education. Pt completed SBT bed>w/c with setup assist then propelled self to ADL apartment. Pt completed SBT w/c<>bed with supervision and verbal cues from OT for anterior weight shifting and lifting to prevent shearing on buttocks. Pt completed supine<>sit with supervision while adhering to weight bearing precautions. Pt and caregiver returned demonstration of SBT with min cues for therapist for positioning of caregiver. Pt able to direct care for ideal placement of w/c and SB for transfer. Pt noted to have increased difficulty transferring to R side and required min cues for adherence to precautions. Completed functional ambulation bed<>bathroom to simulate home environment with min A sit<>stand and min guard for mobility with PF RW. Educated on BSC and TTB and provided demonstration of setup. Educated and completed LUE exercises using lighter weight (pt only has light weight at home) with focus on high repetitions to increase functional endurance. Pt return to room and left with family. Pt and family with no questions at this time.   Therapy Documentation Precautions:  Precautions Precautions: Fall Precaution Comments: Rt rib fractures Restrictions Weight Bearing Restrictions: Yes RUE Weight Bearing: Weight bearing as tolerated RLE Weight Bearing: Non weight bearing General:   Vital Signs:   Pain: Pain Assessment Pain Assessment: 0-10 Pain Score: 3  Pain Type: Acute pain Pain Location: Back Pain Orientation: Lower Pain Descriptors / Indicators: Aching Pain Onset: Gradual Pain Intervention(s): Medication (See eMAR) ADL:   Exercises:   Other Treatments:    See FIM for current functional status  Therapy/Group: Individual Therapy  Samer Dutton N 03/13/2015, 11:33 AM

## 2015-03-14 ENCOUNTER — Inpatient Hospital Stay (HOSPITAL_COMMUNITY): Payer: Self-pay

## 2015-03-14 ENCOUNTER — Inpatient Hospital Stay (HOSPITAL_COMMUNITY): Payer: MEDICAID

## 2015-03-14 ENCOUNTER — Inpatient Hospital Stay (HOSPITAL_COMMUNITY): Payer: Self-pay | Admitting: Occupational Therapy

## 2015-03-14 LAB — CBC
HEMATOCRIT: 29.7 % — AB (ref 39.0–52.0)
HEMOGLOBIN: 9.5 g/dL — AB (ref 13.0–17.0)
MCH: 26.9 pg (ref 26.0–34.0)
MCHC: 32 g/dL (ref 30.0–36.0)
MCV: 84.1 fL (ref 78.0–100.0)
Platelets: 327 10*3/uL (ref 150–400)
RBC: 3.53 MIL/uL — AB (ref 4.22–5.81)
RDW: 14.2 % (ref 11.5–15.5)
WBC: 10.7 10*3/uL — ABNORMAL HIGH (ref 4.0–10.5)

## 2015-03-14 LAB — GLUCOSE, CAPILLARY
GLUCOSE-CAPILLARY: 122 mg/dL — AB (ref 65–99)
GLUCOSE-CAPILLARY: 154 mg/dL — AB (ref 65–99)
GLUCOSE-CAPILLARY: 118 mg/dL — AB (ref 65–99)
Glucose-Capillary: 100 mg/dL — ABNORMAL HIGH (ref 65–99)

## 2015-03-14 NOTE — Progress Notes (Signed)
Physical Therapy Session Note  Patient Details  Name: Jeremy Burgess MRN: [id number] Date of Birth: 1966-06-17  Today's Date: 03/14/2015 PT Individual Time: 1430-1530 PT Individual Time Calculation (min): 60 min   Short Term Goals: Week 3:  PT Short Term Goal 1 (Week 3): =LTG's due to ELOS  Skilled Therapeutic Interventions/Progress Updates:   Session focused on functional bed mobility retraining on flat surface at S level with encouragement for technique and RLE management, transfers using slideboard for OOB at S level (pt able to place slideboard but requires set up for w/c parts management) and min A for PFRW back to bed at end of session with steady A for balance in standing, w/c mobility training in new w/c off unit and up/down slight incline to simulate uneven surfaces (recommended for family to push pt up the ramp for home entry due to stepper incline and limited use of RUE), and therapist adjusted legrests and PFRW for appropriate fit for patient. Pt's sister and niece observed session and left with pt at end of session in bed with vomit bag. RN made aware that pt was sick, stating he got "too hot." Fan also set up for patient at bedside. Call bell in reach.   Therapy Documentation Precautions:  Precautions Precautions: Fall Precaution Comments: Rt rib fractures Restrictions Weight Bearing Restrictions: Yes RUE Weight Bearing: Weight bearing as tolerated RLE Weight Bearing: Non weight bearing  Pain:  c/o pain in L knee and nauseous and vomiting at end of session - RN made aware.   See FIM for current functional status  Therapy/Group: Individual Therapy  [name], PT, DPT  03/14/2015, 3:43 PM

## 2015-03-14 NOTE — Progress Notes (Signed)
Physical Therapy Session Note  Patient Details  Name: Jeremy Burgess MRN: [id number] Date of Birth: 17-Jun-1966  Today's Date: 03/14/2015 PT Individual Time: 244-010 (7 min)  Short Term Goals: Week 3:  PT Short Term Goal 1 (Week 3): =LTG's due to ELOS  Skilled Therapeutic Interventions/Progress Updates:    Session focused on functional bed mobility retraining from flat bed to simulate home environment, basic transfers with slideboard and PFRW with focus on safety and directing care for management of legrests and set-up/positining of w/c, w/c propulsion for functional mobility training and overall strengthening and endurance, gait training with PFRW x 15', x 20' with cues for efficiency and improved positioning of PFRW for safety and balance with steady A, and standing RLE therex for strengthening, balance, and to improve ability to keep RLE off of floor for NWB status including marching, hip extension, hip abduction and hamstring curls x 15 reps each. Pt requires rest breaks due to pain and decreased activity tolerance. Educated on importance of continued mobility at home and HEP to continue to build this back up - pt verbalized understanding. End of session pt request to return back to bed using RW with steady A to stabilize PFRW during sit to stand and positioned for comfort in the bed. Pt reports new mattress (regular mattress) feels better and pt did not c/o back pain today. All needs in reach.   Therapy Documentation Precautions:  Precautions Precautions: Fall Precaution Comments: Rt rib fractures Restrictions Weight Bearing Restrictions: Yes RUE Weight Bearing: Weight bearing as tolerated RLE Weight Bearing: Non weight bearing  Pain: C/0 3/10 pain in top of L foot and ribs. Premedicated per pt report.  See FIM for current functional status  Therapy/Group: Individual Therapy  [name], PT, DPT  03/14/2015, 8:58 AM

## 2015-03-14 NOTE — Progress Notes (Signed)
Occupational Therapy Session Note  Patient Details  Name: Jeremy Burgess MRN: [id number] Date of Birth: Dec 20, 1965  Today's Date: 03/14/2015 OT Individual Time: 1030-1130 OT Individual Time Calculation (min): 60 min    Short Term Goals: Week 3:  OT Short Term Goal 1 (Week 3): STGs=LTGs secondary to upcoming discharge  Skilled Therapeutic Interventions/Progress Updates:    Pt resting in bed upon arrival and agreeable to engaging in therapy.  Pt engaged in BADL retraining including bathing and dressing seated EOB.  Pt utilized lateral leans to attempt to bathe buttocks but was able to doff/donn pants without assistance.  When his left knee neoprene sleeve was removed it was noted that his skin had a rash and a small section was raw looking.  Pt stated he was wondering why his knee was burning.  RN notified and foam dressing applied.  RN to order a new sleeve.  Pt's current sleeve was damp from continued perspiration and wearing of sleeve.  Recommended that patient remove knee sleeve when resting in bed.  Pt unable to stand secondary to increased pain in left knee without sleeve.  Pt also c/o pain in left foot.  Pt is independent with directing care and states he believes he is ready to discharge home.  Focus on activity tolerance, bed mobility, BADL retraining, directing care, and safety awareness.  Therapy Documentation Precautions:  Precautions Precautions: Fall Precaution Comments: Rt rib fractures Restrictions Weight Bearing Restrictions: Yes RUE Weight Bearing: Weight bearing as tolerated RLE Weight Bearing: Non weight bearing General: General OT Amount of Missed Time: 30 Minutes   Pain:  Pt reported that his pain was "not bad"  See FIM for current functional status  Therapy/Group: Individual Therapy  Leroy Libman 03/14/2015, 11:50 AM

## 2015-03-14 NOTE — Progress Notes (Signed)
Physical Therapy Discharge Summary  Patient Details  Name: Jeremy Burgess MRN: [id number] Date of Birth: 10/29/1965  Today's Date: 03/15/2015 PT Individual Time: 1005-1105 PT Individual Time Calculation (min): 60 min    Patient has met 9 of 9 long term goals due to improved activity tolerance, improved balance, increased strength, increased range of motion and ability to compensate for deficits.  Patient to discharge at a wheelchair level Supervision using slideboard for transfers and min A for short distance gait with R PFRW.  Patient's sister in law is independent to provide the necessary physical and set-up assistance at discharge and successfully completed family education.   Recommendation:  Patient will benefit from ongoing skilled PT services in home health setting to continue to advance safe functional mobility, address ongoing impairments in gait, ROM, strength, endurance, balance, functional mobility, pain, and minimize fall risk.  Equipment: 18x18 w/c with basic cushion and back and elevating legrests, R PFRW, 30" slideboard  Reasons for discharge: treatment goals met and discharge from hospital  Patient/family agrees with progress made and goals achieved: Yes  Skilled PT Intervention this Session:   Therapeutic Activity: Pt performed sit<>stand transfers from a variety of surfaces with supervision/set up. Pt asked that PT stabilize walker during transition from sitting<>standing. Pt demonstrates supine<>sit on therapy mat with modified independence, requiring only increased time to complete the task. Pt demonstrates car transfer using stand pivot with PRW with supervision and able to direct PT in needs for safe transfer.    Gait Training: Pt ambulated 47' and 15' with PRW and required supervision initially, progressing to steady A with fatigue. Pt able to maintain weight bearing status in RUE and RLE throughout ambulation. Pt ambulates very slowly with hop-to pattern and requires  verbal cues for walker positioning and step length on L for safe ambulation.   Pt returned to room at end of session, sitting in w/c with needs in reach. Pt states he has no further questions.   PT Discharge Precautions/Restrictions Precautions Precautions: Fall Precaution Comments: Rt rib fractures Required Braces or Orthoses: Other Brace/Splint Other Brace/Splint: L knee sleeve for support with OOB Restrictions Weight Bearing Restrictions: Yes RUE Weight Bearing: Weight bearing as tolerated RLE Weight Bearing: Non weight bearing   Cognition Overall Cognitive Status: Within Functional Limits for tasks assessed Arousal/Alertness: Awake/alert Orientation Level: Oriented X4 Attention: Sustained Memory: Appears intact Awareness: Appears intact Safety/Judgment: Appears intact   Sensation Sensation Light Touch: Appears Intact Stereognosis: Not tested Hot/Cold: Appears Intact Proprioception: Appears Intact Coordination Gross Motor Movements are Fluid and Coordinated: No Fine Motor Movements are Fluid and Coordinated: Yes (finger opposition intact) Coordination and Movement Description: Pt coordination for gross motor limited by pain and muscle weakness Finger Nose Finger Test: intact   Motor  Motor Motor: Abnormal postural alignment and control;Other (comment) Motor - Skilled Clinical Observations: continued decreased balance and strength, though improved from evaluation Motor - Discharge Observations: Limited due to multiple orthpedic restrictions and pain.   Mobility Bed Mobility Bed Mobility: Supine to Sit;Sit to Supine Supine to Sit: 6: Modified independent (Device/Increase time) Sit to Supine: 6: Modified independent (Device/Increase time) Transfers Transfers: Yes Sit to Stand: 5: Supervision Stand to Sit: 5: Supervision Lateral/Scoot Transfers: 5: Supervision   Locomotion  Ambulation Ambulation: Yes Ambulation/Gait Assistance: 4: Min guard Ambulation  Distance (Feet): 40 Feet Assistive device: Right platform walker Gait Gait: Yes Gait Pattern: Impaired Gait Pattern: Antalgic;Decreased step length - left;Step-to pattern Gait velocity: very slow Stairs / Additional Locomotion Stairs: No (not safe)  Ramp: 1: +2 Total assist Curb: Not tested (comment) Wheelchair Mobility Wheelchair Mobility: Yes Distance: 150   Trunk/Postural Assessment  Cervical Assessment Cervical Assessment: Within Functional Limits Thoracic Assessment Thoracic Assessment: Within Functional Limits Lumbar Assessment Lumbar Assessment: Within Functional Limits   Balance Static Sitting Balance Static Sitting - Level of Assistance: 6: Modified independent (Device/Increase time) Dynamic Sitting Balance Dynamic Sitting - Level of Assistance: 6: Modified independent (Device/Increase time) Static Standing Balance Static Standing - Level of Assistance: 5: Stand by assistance Dynamic Standing Balance Dynamic Standing - Level of Assistance: 5: Stand by assistance;4: Min assist   Extremity Assessment  RUE Assessment RUE Assessment: Not tested (not tested secondary to weight bearing restrictions) LUE Assessment LUE Assessment: Within Functional Limits RLE Assessment RLE Assessment: Exceptions to [employer] RLE Strength RLE Overall Strength: Deficits;Due to precautions RLE Overall Strength Comments: Unable to formally test due to precautions, patient able to manage RLE for bed mobility and transfers, requires min A to lift RLE onto ELR in w/c.  LLE Assessment LLE Assessment: Within Functional Limits  See FIM for current functional status  Dwyane Dee, PT, DPT Lars Masson, PT, DPT  03/15/2015, 1:25 PM

## 2015-03-14 NOTE — Progress Notes (Signed)
Occupational Therapy Session Note  Patient Details  Name: Jeremy Burgess MRN: [id number] Date of Birth: 31-Mar-1966  Today's Date: 03/14/2015 OT Individual Time: 1300-1330 OT Individual Time Calculation (min): 30 min    Short Term Goals: Week 3:  OT Short Term Goal 1 (Week 3): STGs=LTGs secondary to upcoming discharge  Skilled Therapeutic Interventions/Progress Updates:    1:1 Pt perform bed mobility to come to EOB from flat bed with extra time without bed rail with supervision, maintaining WB status. Pt able to perform slide board transfer from bed to the w/c on his left with supervision once board has been setup. Doffed right UE ace wrap and splint to inspect skin- which had not been performed since sx. Notified Pa and he came to address and assess. Wash and dried skin and don just the splint based on PA's recommendations. Ace wrapped doffed on left arm and was recommended to leave off.  Arm washed and dried. Education provided to patient on proper skin care assessment with wraps and doffing splint to inspect skin daily. Pt performed slide board back to bed with supervision and min A (for left LE management) back into supine position.   - pt still with sutures in right UE  Therapy Documentation Precautions:  Precautions Precautions: Fall Precaution Comments: Rt rib fractures Restrictions Weight Bearing Restrictions: Yes RUE Weight Bearing: Weight bearing as tolerated RLE Weight Bearing: Non weight bearing Pain:  ongoing left foot pain - able to continue with treatment .  Had x ray earlier in day.   See FIM for current functional status  Therapy/Group: Individual Therapy  Willeen Cass [facility] 03/14/2015, 3:36 PM

## 2015-03-14 NOTE — Progress Notes (Signed)
Pt had one episode of Nausea/Vomiting at around 1530. Pt had just returned from the rehab gym and pt attributed nausea to getting "too hot" while in therapy. Pt reports that he no longer feels any nausea and is refusing any prn medications at this time. RN gave pt some gingerale and will continue to monitor pt for any continued vomiting or nausea.
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
## 2015-03-15 ENCOUNTER — Inpatient Hospital Stay (HOSPITAL_COMMUNITY): Payer: MEDICAID | Admitting: Occupational Therapy

## 2015-03-15 ENCOUNTER — Inpatient Hospital Stay (HOSPITAL_COMMUNITY): Payer: Self-pay | Admitting: Physical Therapy

## 2015-03-15 ENCOUNTER — Inpatient Hospital Stay (HOSPITAL_COMMUNITY): Payer: Self-pay | Admitting: Occupational Therapy

## 2015-03-15 ENCOUNTER — Inpatient Hospital Stay (HOSPITAL_COMMUNITY): Payer: MEDICAID | Admitting: Physical Therapy

## 2015-03-15 DIAGNOSIS — M25569 Pain in unspecified knee: Secondary | ICD-10-CM

## 2015-03-15 LAB — GLUCOSE, CAPILLARY
GLUCOSE-CAPILLARY: 102 mg/dL — AB (ref 65–99)
Glucose-Capillary: 89 mg/dL (ref 65–99)
Glucose-Capillary: 125 mg/dL — ABNORMAL HIGH (ref 65–99)
Glucose-Capillary: 84 mg/dL (ref 65–99)
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
## 2015-03-15 NOTE — Progress Notes (Signed)
Physical Therapy Note  Patient Details  Name: Jeremy Burgess MRN: [id number] Date of Birth: 21-Jul-1966 Today's Date: 03/15/2015  Attempted to see patient for PM session. Patient declined due to nausea and reported feeling ready for discharge home tomorrow. Patient has met all long term goals at this time.    [name] A 03/15/2015, 3:22 PM

## 2015-03-15 NOTE — Progress Notes (Signed)
Occupational Therapy Session Note  Patient Details  Name: Jeremy Burgess MRN: [id number] Date of Birth: [birth year]  Today's Date: 03/15/2015 OT Individual Time: 1100-1200 OT Individual Time Calculation (min): 60 min    Short Term Goals: Week 3:  OT Short Term Goal 1 (Week 3): STGs=LTGs secondary to upcoming discharge  Skilled Therapeutic Interventions/Progress Updates:    Pt seen for OT therapy session focusing on functional transfers and UE strengthening. Pt in w/Burgess upon arrival, voicing increased fatigue and discomfort from previous therapy sessions and requesting to " take it easy" during therapy session. Pt self propelled w/Burgess to ADL apartment mod I. Pt initially requested to use slide board to transfer onto low surface couch, however, after positioning w/Burgess up to couch realized it would be a difficulty transfer and requested instead to complete stand pivot with PFRW to couch- stand pivot transfer completed with CGA. Education provided for w/Burgess parts management.  Seated on couch, pt completed various Wii video game activities with emphasis on UE strengthening/ stability. Pt voiced complaints of nausea throughout session, ginger ale provided. He required +2 assist to stand from low soft surface couch, and transferred to w/Burgess via stand pivot with CGA with PFRW. Upon return to w/Burgess, pt became sick and began vomiting, RN made aware and medications administered. Pt left in w/Burgess with nurses present at end of session.   Pt voiced feeling comfortable and confident with upcoming d/Burgess, declining all attempts at practicing functional transfers or IADL tasks due to fatigue.  Pt educated regarding need for assist, various transfer methods, and d/Burgess planning.   Therapy Documentation Precautions:  Precautions Precautions: Fall Precaution Comments: Rt rib fractures Required Braces or Orthoses: Other Brace/Splint Other Brace/Splint: L knee sleeve for support with OOB Restrictions Weight Bearing Restrictions:  Yes RUE Weight Bearing: Weight bearing as tolerated RLE Weight Bearing: Non weight bearing Pain: Pain Assessment Pain Assessment: 0-10 Pain Score: 4  Pain Type: Acute pain Pain Location: Knee Pain Orientation: Right Pain Descriptors / Indicators: Aching Patients Stated Pain Goal: 2 Pain Intervention(s): Ambulation/ Increased activity;Repositioned Multiple Pain Sites: No  See FIM for current functional status  Therapy/Group: Individual Therapy  Jeremy Burgess 03/15/2015, 7:25 AM

## 2015-03-15 NOTE — Plan of Care (Signed)
Problem: RH SKIN INTEGRITY Goal: RH STG MAINTAIN SKIN INTEGRITY WITH ASSISTANCE STG Maintain Skin Integrity With mod Assistance.  Outcome: Not Progressing tissueinjury2/2neoprenesleevelftcalfarea

## 2015-03-15 NOTE — Progress Notes (Signed)
Social Work Patient ID: Jeremy Burgess, male   DOB: Aug 25, 1965, 49 y.o.   MRN: [id number] Met with pt and spoke with sister in-law to confirm discharge for tomorrow and make aware of team conference update. Pt feels ready to go home it has been a long time coming. Equipment delivered and in his room. Follow up arranged. Will work on [employer] and follow up MD appointment. See tomorrow to answer any questions or concerns.

## 2015-03-15 NOTE — Progress Notes (Signed)
Occupational Therapy Discharge Summary  Patient Details  Name: Jeremy Burgess MRN: [id number] Date of Birth: May 28, 1966  Today's Date: 03/15/2015 OT Individual Time: 6195-0932 OT Individual Time Calculation (min): 55 min    Patient has met 9 of 9 long term goals due to improved activity tolerance, improved balance, postural control, ability to compensate for deficits and improved awareness.  Patient to discharge at [facility] Assist level.  Patient's care partner is independent to provide the necessary physical assistance at discharge.    Reasons goals not met: all goals met  Recommendation:  Patient will benefit from ongoing skilled OT services in home health setting to continue to advance functional skills in the area of BADL and iADL.  Equipment: drop arm commode Recommend tub transfer bench  Reasons for discharge: treatment goals met and discharge from hospital  Patient/family agrees with progress made and goals achieved: Yes   OT Intervention: Upon entering the room, pt supine in bed with NT present. Pt with 3/10 c/o pain in L knee this session. Pt agreeable to shower this session. Pt performed supine >sit with supervision and sit <>stand with min A. Pt ambulating with platform RW with Min A for 20' to sit onto TTB with min A. Pt engaged in bathing while seated on TTB with supervision for balance and min A to assist with washing parts. Pt utilized long handled sponge to wash L foot. Pt engaged in LB dressing from TTB with use of long handled reacher to thread elastic wast pants onto feet. Pt ambulating to sit into wheelchair for UB dressing and propelled self to sink from grooming tasks with mod I. Pt remained sitting in wheelchair with call bell and all needed items within reach upon exiting the room.    OT Discharge Precautions/Restrictions  Precautions Precautions: Fall Precaution Comments: Rt rib fractures Required Braces or Orthoses: Other Brace/Splint Other Brace/Splint: L  knee sleeve for support with OOB Restrictions Weight Bearing Restrictions: Yes RUE Weight Bearing: Weight bearing as tolerated RLE Weight Bearing: Non weight bearing  Pain Pain Assessment Pain Assessment: 0-10 Pain Score: 3  Pain Type: Acute pain Pain Location: Knee Pain Orientation: Left Pain Onset: On-going Patients Stated Pain Goal: 2 Pain Intervention(s): Repositioned Multiple Pain Sites: No Vision/Perception  Vision- History Baseline Vision/History: No visual deficits  Cognition Overall Cognitive Status: Within Functional Limits for tasks assessed Orientation Level: Oriented X4 Safety/Judgment: Appears intact Sensation Sensation Light Touch: Appears Intact Stereognosis: Not tested Hot/Cold: Appears Intact Proprioception: Appears Intact Coordination Gross Motor Movements are Fluid and Coordinated: No Fine Motor Movements are Fluid and Coordinated: No Coordination and Movement Description: Pts coordination limited by pain, brace, and increased muscle weakness.  Motor  Motor Motor - Discharge Observations: Limited due to multiple orthpedic restrictions and pain. Mobility  Transfers Sit to Stand: 4: Min assist Stand to Sit: 4: Min assist  Trunk/Postural Assessment  Cervical Assessment Cervical Assessment: Within Functional Limits Thoracic Assessment Thoracic Assessment: Within Functional Limits Lumbar Assessment Lumbar Assessment: Within Functional Limits  Balance Dynamic Sitting Balance Dynamic Sitting - Level of Assistance: 6: Modified independent (Device/Increase time) Static Standing Balance Static Standing - Level of Assistance: 5: Stand by assistance Dynamic Standing Balance Dynamic Standing - Level of Assistance: 5: Stand by assistance;4: Min assist Extremity/Trunk Assessment RUE Assessment RUE Assessment: Not tested (not tested secondary to weight bearing restrictions) LUE Assessment LUE Assessment: Within Functional Limits  See FIM for current  functional status  [name] 03/15/2015, 10:21 AM

## 2015-03-15 NOTE — Discharge Summary (Signed)
Discharge summary job [id number]
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
## 2015-03-16 ENCOUNTER — Encounter (HOSPITAL_COMMUNITY): Payer: Self-pay | Admitting: *Deleted

## 2015-03-16 LAB — CREATININE, SERUM
Creatinine, Ser: 1.36 mg/dL — ABNORMAL HIGH (ref 0.61–1.24)
GFR calc Af Amer: >60 mL/min (ref >60)
GFR calc non Af Amer: 59 mL/min — ABNORMAL LOW (ref >60)

## 2015-03-16 LAB — GLUCOSE, CAPILLARY: Glucose-Capillary: 82 mg/dL (ref 65–99)

## 2015-03-16 MED ORDER — RIVAROXABAN 20 MG PO TABS: ORAL_TABLET | ORAL | Status: DC

## 2015-03-16 MED ORDER — SITAGLIPTIN PHOS-METFORMIN HCL 50-1000 MG PO TABS: 1.0000 | ORAL_TABLET | Freq: Two times a day (BID) | ORAL | Status: DC

## 2015-03-16 MED ORDER — INSULIN DETEMIR 100 UNIT/ML ~~LOC~~ SOLN: 10.0000 [IU] | Freq: Two times a day (BID) | SUBCUTANEOUS | Status: DC

## 2015-03-16 MED ORDER — RIVAROXABAN 15 MG PO TABS: ORAL_TABLET | ORAL | Status: DC

## 2015-03-16 MED ORDER — ADULT MULTIVITAMIN W/MINERALS CH: 1.0000 | ORAL_TABLET | Freq: Every day | ORAL | Status: DC

## 2015-03-16 MED ORDER — OXYCODONE HCL 5 MG PO TABS: 5.0000 mg | ORAL_TABLET | ORAL | Status: DC | PRN

## 2015-03-16 MED ORDER — LISINOPRIL 10 MG PO TABS: 10.0000 mg | ORAL_TABLET | Freq: Every day | ORAL | Status: DC

## 2015-03-16 MED ORDER — RIVAROXABAN 15 MG PO TABS: 15.0000 mg | ORAL_TABLET | Freq: Two times a day (BID) | ORAL | Status: DC

## 2015-03-16 NOTE — Progress Notes (Signed)
Social Work Discharge Note Discharge Note  The overall goal for the admission was met for:   Discharge location: [place] [facility]AREG[facility]  Length of Stay: Yes-20 DAYS  Discharge activity level: Yes-MOD/I [place]  Home/community participation: Yes  Services provided included: MD, RD, PT, OT, SLP, RN, CM, TR, Pharmacy, Neuropsych and SW  Financial Services: Private Insurance: PENDING MEDICAID  Follow-up services arranged: Home Health: [place], DME: [place], WHEELCHAIR, [place] and Patient/Family has no preference for HH/DME agencies  Comments (or additional information):[facility] TRAINED WHO WILL BE HIS PRIMARY CAREGIVER, DID WELL AND FEELS COMFORTABLE WITH PT'S CARE NEEDS. FAMILY TO FOLLOW UP WITH DISABILITY AND MEDICAID APPLICATIONS. ENCOURAGED PT TO DO AS MUCH AS HE CAN FOR HIMSELF AND NOT ALLOW FAMILY TO DO FOR HIM.  Patient/Family verbalized understanding of follow-up arrangements: Yes  Individual responsible for coordination of the follow-up plan: [name]-BROTHER & [name][facility]  Confirmed correct DME delivered: Elease Hashimoto 03/16/2015    Elease Hashimoto

## 2015-03-16 NOTE — Progress Notes (Signed)
Pt prepared for discharge. Home equipment delivered. Discharge instructions were given. Pt has no concerns at this time. Pt discharged to home with family
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
## 2015-03-20 ENCOUNTER — Telehealth: Payer: Self-pay | Admitting: *Deleted

## 2015-03-20 NOTE — Telephone Encounter (Signed)
Sister in law - Elmyra Ricks called and left message requesting a hospital bed. Patient has no follow up appointment scheduled with [place] at this time. At discharge patient was to follow up with [facility], and only see [place] at an "As Needed" basis.  Patient needs to follow up with PCP for hospital bed since [phone].  Called Elmyra Ricks back and advised for her to call the surgeon or the PA since there are no follow up's scheduled with [place]

## 2015-04-07 DIAGNOSIS — E119 Type 2 diabetes mellitus without complications: Secondary | ICD-10-CM

## 2015-04-07 DIAGNOSIS — S82891D Other fracture of right lower leg, subsequent encounter for closed fracture with routine healing: Secondary | ICD-10-CM

## 2015-04-07 DIAGNOSIS — I82411 Acute embolism and thrombosis of right femoral vein: Secondary | ICD-10-CM

## 2015-04-07 DIAGNOSIS — S52201D Unspecified fracture of shaft of right ulna, subsequent encounter for closed fracture with routine healing: Secondary | ICD-10-CM

## 2015-04-07 DIAGNOSIS — S7291XD Unspecified fracture of right femur, subsequent encounter for closed fracture with routine healing: Secondary | ICD-10-CM

## 2015-04-07 DIAGNOSIS — I10 Essential (primary) hypertension: Secondary | ICD-10-CM

## 2015-04-07 DIAGNOSIS — I82442 Acute embolism and thrombosis of left tibial vein: Secondary | ICD-10-CM

## 2015-07-28 ENCOUNTER — Emergency Department (HOSPITAL_COMMUNITY): Payer: Self-pay

## 2015-07-28 ENCOUNTER — Emergency Department (HOSPITAL_COMMUNITY)
Admission: EM | Admit: 2015-07-28 | Discharge: 2015-07-28 | Disposition: A | Discharge status: Home / Self Care | Payer: Self-pay | Attending: Emergency Medicine | Admitting: Emergency Medicine

## 2015-07-28 ENCOUNTER — Encounter (HOSPITAL_COMMUNITY): Payer: Self-pay

## 2015-07-28 DIAGNOSIS — R0789 Other chest pain: Secondary | ICD-10-CM | POA: Insufficient documentation

## 2015-07-28 DIAGNOSIS — Z8781 Personal history of (healed) traumatic fracture: Secondary | ICD-10-CM | POA: Insufficient documentation

## 2015-07-28 DIAGNOSIS — R2241 Localized swelling, mass and lump, right lower limb: Secondary | ICD-10-CM | POA: Insufficient documentation

## 2015-07-28 DIAGNOSIS — E119 Type 2 diabetes mellitus without complications: Secondary | ICD-10-CM | POA: Insufficient documentation

## 2015-07-28 DIAGNOSIS — Z794 Long term (current) use of insulin: Secondary | ICD-10-CM | POA: Insufficient documentation

## 2015-07-28 DIAGNOSIS — R0602 Shortness of breath: Secondary | ICD-10-CM | POA: Insufficient documentation

## 2015-07-28 DIAGNOSIS — Z86718 Personal history of other venous thrombosis and embolism: Secondary | ICD-10-CM | POA: Insufficient documentation

## 2015-07-28 DIAGNOSIS — I10 Essential (primary) hypertension: Secondary | ICD-10-CM | POA: Insufficient documentation

## 2015-07-28 DIAGNOSIS — Z7901 Long term (current) use of anticoagulants: Secondary | ICD-10-CM | POA: Insufficient documentation

## 2015-07-28 DIAGNOSIS — Z79899 Other long term (current) drug therapy: Secondary | ICD-10-CM | POA: Insufficient documentation

## 2015-07-28 HISTORY — DX: Personal history of other venous thrombosis and embolism: Z86.718

## 2015-07-28 LAB — CBC WITH DIFFERENTIAL/PLATELET
BASOS PCT: 0 %
Basophils Absolute: 0 10*3/uL (ref 0.0–0.1)
Eosinophils Absolute: 0.2 10*3/uL (ref 0.0–0.7)
Eosinophils Relative: 2 %
HCT: 41.5 % (ref 39.0–52.0)
HEMOGLOBIN: 13.4 g/dL (ref 13.0–17.0)
LYMPHS ABS: 2.4 10*3/uL (ref 0.7–4.0)
Lymphocytes Relative: 36 %
MCH: 26.5 pg (ref 26.0–34.0)
MCHC: 32.3 g/dL (ref 30.0–36.0)
MCV: 82.2 fL (ref 78.0–100.0)
MONOS PCT: 11 %
Monocytes Absolute: 0.7 10*3/uL (ref 0.1–1.0)
NEUTROS ABS: 3.4 10*3/uL (ref 1.7–7.7)
NEUTROS PCT: 51 %
PLATELETS: 208 10*3/uL (ref 150–400)
RBC: 5.05 MIL/uL (ref 4.22–5.81)
RDW: 16.6 % — ABNORMAL HIGH (ref 11.5–15.5)
WBC: 6.8 10*3/uL (ref 4.0–10.5)

## 2015-07-28 LAB — APTT: APTT: 27 s (ref 24–37)

## 2015-07-28 LAB — COMPREHENSIVE METABOLIC PANEL
ALT: 13 U/L — ABNORMAL LOW (ref 17–63)
ANION GAP: 8 (ref 5–15)
AST: 22 U/L (ref 15–41)
Albumin: 4.2 g/dL (ref 3.5–5.0)
Alkaline Phosphatase: 91 U/L (ref 38–126)
BUN: 15 mg/dL (ref 6–20)
CHLORIDE: 106 mmol/L (ref 101–111)
CO2: 30 mmol/L (ref 22–32)
Calcium: 9.5 mg/dL (ref 8.9–10.3)
Creatinine, Ser: 1.04 mg/dL (ref 0.61–1.24)
GFR calc non Af Amer: >60 mL/min (ref >60)
Glucose, Bld: 105 mg/dL — ABNORMAL HIGH (ref 65–99)
Potassium: 4.3 mmol/L (ref 3.5–5.1)
SODIUM: 144 mmol/L (ref 135–145)
Total Bilirubin: 0.4 mg/dL (ref 0.3–1.2)
Total Protein: 7.9 g/dL (ref 6.5–8.1)

## 2015-07-28 LAB — TROPONIN I: Troponin I: <0.03 ng/mL (ref <0.031)

## 2015-07-28 MED ORDER — ONDANSETRON HCL 4 MG/2ML IJ SOLN
4.0000 mg | Freq: Once | INTRAMUSCULAR | Status: AC
  Administered 2015-07-28: 4 mg via INTRAVENOUS
  Filled 2015-07-28: qty 2

## 2015-07-28 MED ORDER — KETOROLAC TROMETHAMINE 30 MG/ML IJ SOLN
30.0000 mg | Freq: Once | INTRAMUSCULAR | Status: AC
  Administered 2015-07-28: 30 mg via INTRAVENOUS
  Filled 2015-07-28: qty 1

## 2015-07-28 MED ORDER — NAPROXEN 500 MG PO TABS: ORAL_TABLET | ORAL | Status: DC

## 2015-07-28 MED ORDER — CYCLOBENZAPRINE HCL 10 MG PO TABS
10.0000 mg | ORAL_TABLET | Freq: Once | ORAL | Status: AC
  Administered 2015-07-28: 10 mg via ORAL
  Filled 2015-07-28: qty 1

## 2015-07-28 MED ORDER — IOHEXOL 350 MG/ML SOLN
100.0000 mL | Freq: Once | INTRAVENOUS | Status: AC | PRN
  Administered 2015-07-28: 100 mL via INTRAVENOUS

## 2015-07-28 MED ORDER — FENTANYL CITRATE (PF) 100 MCG/2ML IJ SOLN
50.0000 ug | Freq: Once | INTRAMUSCULAR | Status: AC
  Administered 2015-07-28: 50 ug via INTRAVENOUS
  Filled 2015-07-28: qty 2

## 2015-07-28 MED ORDER — CYCLOBENZAPRINE HCL 10 MG PO TABS: 10.0000 mg | ORAL_TABLET | Freq: Three times a day (TID) | ORAL | Status: DC | PRN

## 2015-07-28 NOTE — Discharge Instructions (Signed)
Try heat and ice to the area. Take the medication for pain. You can go to the health department to follow up for your pain. Your tests today show you are not having a blood clot in your lungs, urine not having pneumonia, ED have a collapsed lung, and you are not having a heart attack.

## 2015-07-28 NOTE — ED Notes (Signed)
Pt states he awoke with pain to his right chest that radiates through to his back.  Pt relates that he had a tree fall on him in June and has had multiple blood clots in legs and has not been taking his xarelto due to cost.

## 2015-07-28 NOTE — ED Notes (Signed)
Pt states understanding of care given and follow up instructions.  Ambulated from ED.  Refused wheelchair multiple times by multiple people

## 2015-07-28 NOTE — ED Provider Notes (Signed)
CSN: [id number]     Arrival date & time 07/28/15  0208 History   First MD Initiated Contact with Patient 07/28/15 [phone]    Chief Complaint  Patient presents with  . Chest Pain     (Consider location/radiation/quality/duration/timing/severity/associated sxs/prior Treatment) HPI patient reports 2 hours ago he was sleeping and he was awakened from sleep with pain in the center of his chest that radiates into his back. He states the pain is sharp. He also has some shortness of breath. He does not have nausea or vomiting, diaphoresis. He states he's never had this pain before. Patient states he had a tree fall him while cutting down a tree on June 28 and he had severe injuries to his right chest with multiple rib fractures and requiring chest tube for collapsed lung. He also has a plate in his right arm and he had a femur fracture and a fracture of his right ankle. He states when he was in the hospital he developed DVT in both his legs and he was discharged from the hospital on August 5. He had been on xarelto however he was only given a month's supply and when it ran out he did not get anymore. He states it costs $400 and he couldn't buy anymore. He also states he didn't have a doctor however he is taking Janumet and when inquired how he gets that he goes to the health department.   PCP [facility]   Past Medical History  Diagnosis Date  . Diabetes mellitus   . Diabetes mellitus ([place])   . Hypertension   . Smoker 0    denies smoking  . History of blood clots    Past Surgical History  Procedure Laterality Date  . Nose surgery    . Femur im nail Right 02/09/2015    Procedure: INTRAMEDULLARY (IM) RETROGRADE FEMORAL NAILING;  Surgeon: [name], MD;  Location: [place];  Service: Orthopedics;  Laterality: Right;  . External fixation leg Right 02/09/2015    Procedure: EXTERNAL FIXATION LEG;  Surgeon: [name], MD;  Location: [place];  Service: Orthopedics;   Laterality: Right;  . Orif ulnar fracture Right 02/09/2015    Procedure: OPEN REDUCTION INTERNAL FIXATION (ORIF) ULNAR FRACTURE;  Surgeon: [name], MD;  Location: [place];  Service: Orthopedics;  Laterality: Right;  . Orif ankle fracture Right 02/14/2015    Procedure: OPEN REDUCTION INTERNAL FIXATION (ORIF) PILON FRACTURE;  Surgeon: [name], MD;  Location: [place];  Service: Orthopedics;  Laterality: Right;  . External fixation removal Right 02/14/2015    Procedure: REMOVAL EXTERNAL FIXATION LEG;  Surgeon: [name], MD;  Location: [place];  Service: Orthopedics;  Laterality: Right;  . Back surgery     Family History  Problem Relation Age of Onset  . Diabetes Mother   . COPD Mother    Social History  Substance Use Topics  . Smoking status: Never Smoker   . Smokeless tobacco: None  . Alcohol Use: No  unemployed  Review of Systems  All other systems reviewed and are negative.     Allergies  Bee venom and Bee venom  Home Medications   Prior to Admission medications   Medication Sig Start Date End Date Taking? Authorizing Provider  cyclobenzaprine (FLEXERIL) 10 MG tablet Take 1 tablet (10 mg total) by mouth 3 (three) times daily as needed (pain). 07/28/15   Rolland Porter, MD  diclofenac (VOLTAREN) 75 MG EC tablet Take 1 tablet (  75 mg total) by mouth 2 (two) times daily. Patient not taking: Reported on 02/08/2015 08/10/14   Lily Kocher, PA-C  docusate sodium (COLACE) 100 MG capsule Take 1 capsule (100 mg total) by mouth 2 (two) times daily. 02/14/15   Brittney Claiborne Billings, PA-C  HYDROcodone-acetaminophen (NORCO/VICODIN) 5-325 MG per tablet 1 at hs or q4h prn pain Patient not taking: Reported on 02/08/2015 08/10/14   Lily Kocher, PA-C  insulin detemir (LEVEMIR) 100 UNIT/ML injection Inject 0.1 mLs (10 Units total) into the skin 2 (two) times daily. 03/16/15   Lavon Paganini Angiulli, PA-C  lisinopril (PRINIVIL,ZESTRIL) 10 MG tablet Take 1 tablet (10 mg total) by mouth daily. 03/16/15    Lavon Paganini Angiulli, PA-C  Multiple Vitamin (MULTIVITAMIN WITH MINERALS) TABS tablet Take 1 tablet by mouth daily. 03/16/15   Lavon Paganini Angiulli, PA-C  naproxen (NAPROSYN) 500 MG tablet Take 1 po BID with food prn pain 07/28/15   Rolland Porter, MD  oxyCODONE (OXY IR/ROXICODONE) 5 MG immediate release tablet Take 1 tablet (5 mg total) by mouth every 4 (four) hours as needed for breakthrough pain. 03/16/15   Lavon Paganini Angiulli, PA-C  Rivaroxaban (XARELTO) 15 MG TABS tablet 1 tablet twice daily until 03/21/2015 and stop 03/16/15   Lavon Paganini Angiulli, PA-C  rivaroxaban (XARELTO) 20 MG TABS tablet Take 1 tablet daily beginning 03/22/2015 03/16/15   Lavon Paganini Angiulli, PA-C  sitaGLIPtin-metformin (JANUMET) 50-1000 MG per tablet Take 1 tablet by mouth 2 (two) times daily with a meal. 03/16/15   Lavon Paganini Angiulli, PA-C   BP 172/81 mmHg  Pulse 66  Temp(Src) 98 F (36.7 C) (Oral)  Resp 14  Ht 6' (1.829 m)  Wt 233 lb (105.688 kg)  BMI 31.59 kg/m2  SpO2 97%  Vital signs normal except hypertension  Physical Exam  Constitutional: He is oriented to person, place, and time. He appears well-developed and well-nourished.  Non-toxic appearance. He does not appear ill. No distress.  HENT:  Head: Normocephalic and atraumatic.  Right Ear: External ear normal.  Left Ear: External ear normal.  Nose: Nose normal. No mucosal edema or rhinorrhea.  Mouth/Throat: Oropharynx is clear and moist and mucous membranes are normal. No dental abscesses or uvula swelling.  Eyes: Conjunctivae and EOM are normal. Pupils are equal, round, and reactive to light.  Neck: Normal range of motion and full passive range of motion without pain. Neck supple.  Cardiovascular: Normal rate, regular rhythm and normal heart sounds.  Exam reveals no gallop and no friction rub.   No murmur heard. Pulmonary/Chest: Effort normal and breath sounds normal. No respiratory distress. He has no wheezes. He has no rhonchi. He has no rales. He exhibits no tenderness and  no crepitus.  Abdominal: Soft. Normal appearance and bowel sounds are normal. He exhibits no distension. There is no tenderness. There is no rebound and no guarding.  Musculoskeletal: Normal range of motion. He exhibits no edema or tenderness.  Moves all extremities well. He has mild diffuse swelling of his right lower extremity.  Neurological: He is alert and oriented to person, place, and time. He has normal strength. No cranial nerve deficit.  Skin: Skin is warm, dry and intact. No rash noted. No erythema. No pallor.  Psychiatric: He has a normal mood and affect. His speech is normal and behavior is normal. His mood appears not anxious.  Nursing note and vitals reviewed.   ED Course  Procedures (including critical care time)  Medications  fentaNYL (SUBLIMAZE) injection 50 mcg (50 mcg  Intravenous Given 07/28/15 0340)  ondansetron (ZOFRAN) injection 4 mg (4 mg Intravenous Given 07/28/15 0340)  iohexol (OMNIPAQUE) 350 MG/ML injection 100 mL (100 mLs Intravenous Contrast Given 07/28/15 0410)  ketorolac (TORADOL) 30 MG/ML injection 30 mg (30 mg Intravenous Given 07/28/15 0501)  cyclobenzaprine (FLEXERIL) tablet 10 mg (10 mg Oral Given 07/28/15 0501)   Due to the patient's history of DVT in his legs and noncompliance with his blood thinner, xarelto, and his acute onset of central chest pain with shortness of breath he had a CT angiography chest done to look for pulmonary embolus. He was given IV pain medicine.  4:30 AM patient was given his CT results. He states he still having pain. We discussed getting a second troponin for a delta troponin to rule out cardiac etiologies pain. He was then given Toradol and Flexeril for his pain.   06:00 pt's delta troponin is negative. Have talked to patient about all the things that could be potentially bad that are not present. He is ready to be discharged.  Labs Review Results for orders placed or performed during the hospital encounter of 07/28/15   Comprehensive metabolic panel  Result Value Ref Range   Sodium 144 135 - 145 mmol/L   Potassium 4.3 3.5 - 5.1 mmol/L   Chloride 106 101 - 111 mmol/L   CO2 30 22 - 32 mmol/L   Glucose, Bld 105 (H) 65 - 99 mg/dL   BUN 15 6 - 20 mg/dL   Creatinine, Ser 1.04 0.61 - 1.24 mg/dL   Calcium 9.5 8.9 - 10.3 mg/dL   Total Protein 7.9 6.5 - 8.1 g/dL   Albumin 4.2 3.5 - 5.0 g/dL   AST 22 15 - 41 U/L   ALT 13 (L) 17 - 63 U/L   Alkaline Phosphatase 91 38 - 126 U/L   Total Bilirubin 0.4 0.3 - 1.2 mg/dL   GFR calc non Af Amer >60 >60 mL/min   GFR calc Af Amer >60 >60 mL/min   Anion gap 8 5 - 15  CBC with Differential  Result Value Ref Range   WBC 6.8 4.0 - 10.5 K/uL   RBC 5.05 4.22 - 5.81 MIL/uL   Hemoglobin 13.4 13.0 - 17.0 g/dL   HCT 41.5 39.0 - 52.0 %   MCV 82.2 78.0 - 100.0 fL   MCH 26.5 26.0 - 34.0 pg   MCHC 32.3 30.0 - 36.0 g/dL   RDW 16.6 (H) 11.5 - 15.5 %   Platelets 208 150 - 400 K/uL   Neutrophils Relative % 51 %   Neutro Abs 3.4 1.7 - 7.7 K/uL   Lymphocytes Relative 36 %   Lymphs Abs 2.4 0.7 - 4.0 K/uL   Monocytes Relative 11 %   Monocytes Absolute 0.7 0.1 - 1.0 K/uL   Eosinophils Relative 2 %   Eosinophils Absolute 0.2 0.0 - 0.7 K/uL   Basophils Relative 0 %   Basophils Absolute 0.0 0.0 - 0.1 K/uL  Troponin I  Result Value Ref Range   Troponin I <0.03 <0.031 ng/mL  APTT  Result Value Ref Range   aPTT 27 24 - 37 seconds  Troponin I  Result Value Ref Range   Troponin I <0.03 <0.031 ng/mL   Laboratory interpretation all normal     Imaging Review Ct Angio Chest Pe W/cm &/or Wo Cm  07/28/2015  CLINICAL DATA:  Acute onset of right-sided chest pain, radiating to the back. Personal history of deep venous thrombosis; not taking Xarelto due to cost.  Initial encounter. EXAM: CT ANGIOGRAPHY CHEST WITH CONTRAST TECHNIQUE: Multidetector CT imaging of the chest was performed using the standard protocol during bolus administration of intravenous contrast. Multiplanar CT image  reconstructions and MIPs were obtained to evaluate the vascular anatomy. CONTRAST:  19mL OMNIPAQUE IOHEXOL 350 MG/ML SOLN COMPARISON:  Chest radiograph performed 02/21/2011 FINDINGS: There is no evidence of pulmonary embolus. Mild bibasilar atelectasis or scarring is noted. The lungs are otherwise clear. There is no evidence of significant focal consolidation, pleural effusion or pneumothorax. No masses are identified; no abnormal focal contrast enhancement is seen. Scattered coronary artery calcification is noted. The mediastinum is otherwise unremarkable. No mediastinal lymphadenopathy is seen. No pericardial effusion is identified. The great vessels are grossly unremarkable in appearance. No axillary lymphadenopathy is seen. The visualized portions of the thyroid gland are unremarkable in appearance. The visualized portions of the liver and spleen are unremarkable. No acute osseous abnormalities are seen. 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 Review of the MIP images confirms the above findings. IMPRESSION: 1. No evidence of pulmonary embolus. 2. Mild bibasilar atelectasis or scarring noted. Lungs otherwise clear. 3. Scattered coronary artery calcification noted. 4. Significant chronic right-sided rib deformities noted, with bridging of the ribs. Electronically Signed   By: Garald Balding M.D.   On: 07/28/2015 04:32   I have personally reviewed and evaluated these images and lab results as part of my medical decision-making.   EKG Interpretation   Date/Time:  Friday July 28 2015 02:19:45 EST Ventricular Rate:  69 PR Interval:  154 QRS Duration: 93 QT Interval:  379 QTC Calculation: 406 R Axis:   55 Text Interpretation:  Sinus rhythm Normal ECG No significant change since  last tracing 21 Feb 2011 Confirmed by Mikalia Fessel  MD-I, Marcy Bogosian ([id number]) on  07/28/2015 2:48:12 AM      MDM   Final diagnoses:  Atypical chest pain    Discharge Medication List  as of 07/28/2015  6:08 AM    START taking these medications   Details  cyclobenzaprine (FLEXERIL) 10 MG tablet Take 1 tablet (10 mg total) by mouth 3 (three) times daily as needed (pain)., Starting 07/28/2015, Until Discontinued, Print    naproxen (NAPROSYN) 500 MG tablet Take 1 po BID with food prn pain, Print        Plan discharge  Rolland Porter, MD, Barbette Or, MD 07/28/15 [phone]

## 2015-09-13 ENCOUNTER — Emergency Department (HOSPITAL_COMMUNITY): Payer: Self-pay

## 2015-09-13 ENCOUNTER — Encounter (HOSPITAL_COMMUNITY): Payer: Self-pay | Admitting: Emergency Medicine

## 2015-09-13 ENCOUNTER — Emergency Department (HOSPITAL_COMMUNITY)
Admission: EM | Admit: 2015-09-13 | Discharge: 2015-09-13 | Disposition: A | Discharge status: Home / Self Care | Payer: Self-pay | Attending: Emergency Medicine | Admitting: Emergency Medicine

## 2015-09-13 DIAGNOSIS — R0602 Shortness of breath: Secondary | ICD-10-CM | POA: Insufficient documentation

## 2015-09-13 DIAGNOSIS — Z86718 Personal history of other venous thrombosis and embolism: Secondary | ICD-10-CM | POA: Insufficient documentation

## 2015-09-13 DIAGNOSIS — Z9114 Patient's other noncompliance with medication regimen: Secondary | ICD-10-CM | POA: Insufficient documentation

## 2015-09-13 DIAGNOSIS — Z8781 Personal history of (healed) traumatic fracture: Secondary | ICD-10-CM | POA: Insufficient documentation

## 2015-09-13 DIAGNOSIS — I10 Essential (primary) hypertension: Secondary | ICD-10-CM | POA: Insufficient documentation

## 2015-09-13 DIAGNOSIS — Z794 Long term (current) use of insulin: Secondary | ICD-10-CM | POA: Insufficient documentation

## 2015-09-13 DIAGNOSIS — Z8709 Personal history of other diseases of the respiratory system: Secondary | ICD-10-CM | POA: Insufficient documentation

## 2015-09-13 DIAGNOSIS — R079 Chest pain, unspecified: Secondary | ICD-10-CM

## 2015-09-13 DIAGNOSIS — Z7901 Long term (current) use of anticoagulants: Secondary | ICD-10-CM | POA: Insufficient documentation

## 2015-09-13 DIAGNOSIS — Z7984 Long term (current) use of oral hypoglycemic drugs: Secondary | ICD-10-CM | POA: Insufficient documentation

## 2015-09-13 DIAGNOSIS — R0789 Other chest pain: Secondary | ICD-10-CM | POA: Insufficient documentation

## 2015-09-13 DIAGNOSIS — R42 Dizziness and giddiness: Secondary | ICD-10-CM | POA: Insufficient documentation

## 2015-09-13 DIAGNOSIS — Z79899 Other long term (current) drug therapy: Secondary | ICD-10-CM | POA: Insufficient documentation

## 2015-09-13 DIAGNOSIS — R110 Nausea: Secondary | ICD-10-CM | POA: Insufficient documentation

## 2015-09-13 DIAGNOSIS — E119 Type 2 diabetes mellitus without complications: Secondary | ICD-10-CM | POA: Insufficient documentation

## 2015-09-13 HISTORY — DX: Pneumothorax, unspecified: J93.9

## 2015-09-13 HISTORY — DX: Fracture of one rib, unspecified side, initial encounter for closed fracture: S22.39XA

## 2015-09-13 LAB — HEPATIC FUNCTION PANEL
ALT: 31 U/L (ref 17–63)
AST: 55 U/L — ABNORMAL HIGH (ref 15–41)
Albumin: 4.5 g/dL (ref 3.5–5.0)
Alkaline Phosphatase: 112 U/L (ref 38–126)
BILIRUBIN DIRECT: 0.3 mg/dL (ref 0.1–0.5)
BILIRUBIN INDIRECT: 0.7 mg/dL (ref 0.3–0.9)
Total Bilirubin: 1 mg/dL (ref 0.3–1.2)
Total Protein: 8.5 g/dL — ABNORMAL HIGH (ref 6.5–8.1)

## 2015-09-13 LAB — BASIC METABOLIC PANEL
Anion gap: 9 (ref 5–15)
BUN: 22 mg/dL — AB (ref 6–20)
CHLORIDE: 105 mmol/L (ref 101–111)
CO2: 29 mmol/L (ref 22–32)
CREATININE: 1.02 mg/dL (ref 0.61–1.24)
Calcium: 9.7 mg/dL (ref 8.9–10.3)
GFR calc Af Amer: >60 mL/min (ref >60)
GFR calc non Af Amer: >60 mL/min (ref >60)
GLUCOSE: 129 mg/dL — AB (ref 65–99)
Potassium: 4.5 mmol/L (ref 3.5–5.1)
Sodium: 143 mmol/L (ref 135–145)

## 2015-09-13 LAB — PROTIME-INR
INR: 0.95 (ref 0.00–1.49)
PROTHROMBIN TIME: 12.9 s (ref 11.6–15.2)

## 2015-09-13 LAB — CBC
HEMATOCRIT: 42.9 % (ref 39.0–52.0)
Hemoglobin: 14.3 g/dL (ref 13.0–17.0)
MCH: 27.6 pg (ref 26.0–34.0)
MCHC: 33.3 g/dL (ref 30.0–36.0)
MCV: 82.7 fL (ref 78.0–100.0)
PLATELETS: 207 10*3/uL (ref 150–400)
RBC: 5.19 MIL/uL (ref 4.22–5.81)
RDW: 14.9 % (ref 11.5–15.5)
WBC: 10.2 10*3/uL (ref 4.0–10.5)

## 2015-09-13 LAB — I-STAT TROPONIN, ED: Troponin i, poc: 0 ng/mL (ref 0.00–0.08)

## 2015-09-13 LAB — D-DIMER, QUANTITATIVE (NOT AT ARMC): D DIMER QUANT: 1.11 ug{FEU}/mL — AB (ref 0.00–0.50)

## 2015-09-13 MED ORDER — ONDANSETRON HCL 4 MG/2ML IJ SOLN
4.0000 mg | INTRAMUSCULAR | Status: AC
  Administered 2015-09-13: 4 mg via INTRAVENOUS
  Filled 2015-09-13: qty 2

## 2015-09-13 MED ORDER — IOHEXOL 350 MG/ML SOLN
100.0000 mL | Freq: Once | INTRAVENOUS | Status: AC | PRN
  Administered 2015-09-13: 100 mL via INTRAVENOUS

## 2015-09-13 MED ORDER — ASPIRIN 81 MG PO CHEW
324.0000 mg | CHEWABLE_TABLET | Freq: Once | ORAL | Status: AC
  Administered 2015-09-13: 324 mg via ORAL
  Filled 2015-09-13: qty 4

## 2015-09-13 MED ORDER — RIVAROXABAN 20 MG PO TABS: 20.0000 mg | ORAL_TABLET | Freq: Every day | ORAL | Status: DC

## 2015-09-13 MED ORDER — OXYCODONE-ACETAMINOPHEN 5-325 MG PO TABS
2.0000 | ORAL_TABLET | Freq: Once | ORAL | Status: AC
  Administered 2015-09-13: 2 via ORAL
  Filled 2015-09-13: qty 2

## 2015-09-13 NOTE — ED Notes (Signed)
Pt concerned about swelling in RUQ, notified PA and she will reassess.

## 2015-09-13 NOTE — ED Notes (Signed)
Pt reports woke up this morning at 5am with r sided chest pain, sob, and dizziness.  Reports has had blood clots in both legs since June and has not been refilled his xarelto after the initial prescription ran out.
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
## 2015-09-13 NOTE — Discharge Instructions (Signed)
Use the medication voucher we have provided for you to fill your xarelto.  It is very important that you take this medication as prescribed to prevent further blood clots. Follow-up with your primary care physician. Return to the ED for new or worsening symptoms.

## 2015-09-13 NOTE — ED Notes (Signed)
Patient is resting comfortably. 

## 2015-09-13 NOTE — ED Notes (Signed)
No obvious redness, pain or swelling in lower leg, bilateral pedal pulses present.

## 2015-09-13 NOTE — Care Management (Signed)
CM consult placed for med needs. Pt has previously been started on xeralto but has not taking in a long time due to not having insurance and Rx costing too much. Pt goes ot [facility] for PCP care. Pt reports he had been given samples to take from his MD's office (a surgeon he was seeing) and once the samples ran out he was unable to afford the medication any more. Pt given voucher for first 30 days free and given application to fill out and send in or number to call to receive financial assistance through drug company. Pt states he will f/u with this assistance. Pt will f/u with [facility] health dept. Pt has no further CM needs.
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
## 2015-10-19 ENCOUNTER — Ambulatory Visit (HOSPITAL_COMMUNITY)
Admit: 2015-10-19 | Discharge: 2015-10-19 | Disposition: A | Discharge status: Home / Self Care | Payer: Self-pay | Attending: Emergency Medicine | Admitting: Emergency Medicine

## 2015-10-19 ENCOUNTER — Encounter (HOSPITAL_COMMUNITY): Payer: Self-pay | Admitting: *Deleted

## 2015-10-19 ENCOUNTER — Other Ambulatory Visit (HOSPITAL_COMMUNITY): Payer: Self-pay | Admitting: Emergency Medicine

## 2015-10-19 ENCOUNTER — Emergency Department (HOSPITAL_COMMUNITY)
Admission: EM | Admit: 2015-10-19 | Discharge: 2015-10-19 | Disposition: A | Discharge status: Home / Self Care | Payer: MEDICAID | Attending: Emergency Medicine | Admitting: Emergency Medicine

## 2015-10-19 DIAGNOSIS — Z86718 Personal history of other venous thrombosis and embolism: Secondary | ICD-10-CM | POA: Insufficient documentation

## 2015-10-19 DIAGNOSIS — E119 Type 2 diabetes mellitus without complications: Secondary | ICD-10-CM | POA: Insufficient documentation

## 2015-10-19 DIAGNOSIS — Z7982 Long term (current) use of aspirin: Secondary | ICD-10-CM | POA: Insufficient documentation

## 2015-10-19 DIAGNOSIS — R1011 Right upper quadrant pain: Secondary | ICD-10-CM | POA: Insufficient documentation

## 2015-10-19 DIAGNOSIS — R1010 Upper abdominal pain, unspecified: Secondary | ICD-10-CM

## 2015-10-19 DIAGNOSIS — I10 Essential (primary) hypertension: Secondary | ICD-10-CM | POA: Insufficient documentation

## 2015-10-19 DIAGNOSIS — Z794 Long term (current) use of insulin: Secondary | ICD-10-CM | POA: Insufficient documentation

## 2015-10-19 DIAGNOSIS — I824Z9 Acute embolism and thrombosis of unspecified deep veins of unspecified distal lower extremity: Secondary | ICD-10-CM

## 2015-10-19 LAB — COMPREHENSIVE METABOLIC PANEL
ALT: 15 U/L — ABNORMAL LOW (ref 17–63)
ANION GAP: 6 (ref 5–15)
AST: 19 U/L (ref 15–41)
Albumin: 3.9 g/dL (ref 3.5–5.0)
Alkaline Phosphatase: 83 U/L (ref 38–126)
BUN: 24 mg/dL — ABNORMAL HIGH (ref 6–20)
CALCIUM: 8.5 mg/dL — AB (ref 8.9–10.3)
CHLORIDE: 108 mmol/L (ref 101–111)
CO2: 25 mmol/L (ref 22–32)
CREATININE: 1.14 mg/dL (ref 0.61–1.24)
Glucose, Bld: 121 mg/dL — ABNORMAL HIGH (ref 65–99)
Potassium: 3.9 mmol/L (ref 3.5–5.1)
SODIUM: 139 mmol/L (ref 135–145)
Total Bilirubin: 0.5 mg/dL (ref 0.3–1.2)
Total Protein: 7.3 g/dL (ref 6.5–8.1)

## 2015-10-19 LAB — CBC WITH DIFFERENTIAL/PLATELET
Basophils Absolute: 0 10*3/uL (ref 0.0–0.1)
Basophils Relative: 0 %
EOS ABS: 0.1 10*3/uL (ref 0.0–0.7)
EOS PCT: 2 %
HCT: 37.1 % — ABNORMAL LOW (ref 39.0–52.0)
Hemoglobin: 12.3 g/dL — ABNORMAL LOW (ref 13.0–17.0)
LYMPHS ABS: 2 10*3/uL (ref 0.7–4.0)
LYMPHS PCT: 31 %
MCH: 28.1 pg (ref 26.0–34.0)
MCHC: 33.2 g/dL (ref 30.0–36.0)
MCV: 84.7 fL (ref 78.0–100.0)
MONO ABS: 0.7 10*3/uL (ref 0.1–1.0)
MONOS PCT: 10 %
Neutro Abs: 3.6 10*3/uL (ref 1.7–7.7)
Neutrophils Relative %: 57 %
PLATELETS: 191 10*3/uL (ref 150–400)
RBC: 4.38 MIL/uL (ref 4.22–5.81)
RDW: 14 % (ref 11.5–15.5)
WBC: 6.3 10*3/uL (ref 4.0–10.5)

## 2015-10-19 LAB — LIPASE, BLOOD: LIPASE: 44 U/L (ref 11–51)

## 2015-10-19 MED ORDER — GI COCKTAIL ~~LOC~~
30.0000 mL | Freq: Once | ORAL | Status: AC
  Administered 2015-10-19: 30 mL via ORAL
  Filled 2015-10-19: qty 30

## 2015-10-19 MED ORDER — PANTOPRAZOLE SODIUM 40 MG IV SOLR
40.0000 mg | Freq: Once | INTRAVENOUS | Status: AC
  Administered 2015-10-19: 40 mg via INTRAVENOUS
  Filled 2015-10-19: qty 40

## 2015-10-19 NOTE — ED Notes (Signed)
Pt c/o abdominal pain that started tonight around 10pm; pt states he feels nauseous but no vomiting or diarrhea

## 2015-10-19 NOTE — ED Provider Notes (Signed)
No DVT bilat. Pt informed; verb understanding.   US Venous Img Lower Bilateral 10/19/2015  CLINICAL DATA:  History of prior deep venous thrombosis. EXAM: BILATERAL LOWER EXTREMITY VENOUS DOPPLER ULTRASOUND TECHNIQUE: Gray-scale sonography with graded compression, as well as color Doppler and duplex ultrasound were performed to evaluate the lower extremity deep venous systems from the level of the common femoral vein and including the common femoral, femoral, profunda femoral, popliteal and calf veins including the posterior tibial, peroneal and gastrocnemius veins when visible. The superficial great saphenous vein was also interrogated. Spectral Doppler was utilized to evaluate flow at rest and with distal augmentation maneuvers in the common femoral, femoral and popliteal veins. COMPARISON:  None. FINDINGS: RIGHT LOWER EXTREMITY Common Femoral Vein: No evidence of thrombus. Normal compressibility, respiratory phasicity and response to augmentation. Saphenofemoral Junction: No evidence of thrombus. Normal compressibility and flow on color Doppler imaging. Profunda Femoral Vein: No evidence of thrombus. Normal compressibility and flow on color Doppler imaging. Femoral Vein: No evidence of thrombus. Normal compressibility, respiratory phasicity and response to augmentation. Popliteal Vein: No evidence of thrombus. Normal compressibility, respiratory phasicity and response to augmentation. Calf Veins: No evidence of thrombus. Normal compressibility and flow on color Doppler imaging. Superficial Great Saphenous Vein: No evidence of thrombus. Normal compressibility and flow on color Doppler imaging. Venous Reflux:  None. Other Findings:  None. LEFT LOWER EXTREMITY Common Femoral Vein: No evidence of thrombus. Normal compressibility, respiratory phasicity and response to augmentation. Saphenofemoral Junction: No evidence of thrombus. Normal compressibility and flow on color Doppler imaging. Profunda Femoral Vein: No  evidence of thrombus. Normal compressibility and flow on color Doppler imaging. Femoral Vein: No evidence of thrombus. Normal compressibility, respiratory phasicity and response to augmentation. Popliteal Vein: No evidence of thrombus. Normal compressibility, respiratory phasicity and response to augmentation. Calf Veins: No evidence of thrombus. Normal compressibility and flow on color Doppler imaging. Superficial Great Saphenous Vein: No evidence of thrombus. Normal compressibility and flow on color Doppler imaging. Venous Reflux:  None. Other Findings:  None. IMPRESSION: No evidence of deep venous thrombosis seen in either lower extremity. Electronically Signed   By: Marijo Conception, M.D.   On: 10/19/2015 11:42     Francine Graven, DO 10/19/15 1203

## 2015-10-19 NOTE — Discharge Instructions (Signed)
Return for venous ultrasound to see if you need to go back on blood thinners.  Take omeprazole (Prilosec OTC) once a day.   Abdominal Pain, Adult Many things can cause abdominal pain. Usually, abdominal pain is not caused by a disease and will improve without treatment. It can often be observed and treated at home. Your health care provider will do a physical exam and possibly order blood tests and X-rays to help determine the seriousness of your pain. However, in many cases, more time must pass before a clear cause of the pain can be found. Before that point, your health care provider may not know if you need more testing or further treatment. HOME CARE INSTRUCTIONS Monitor your abdominal pain for any changes. The following actions may help to alleviate any discomfort you are experiencing:  Only take over-the-counter or prescription medicines as directed by your health care provider.  Do not take laxatives unless directed to do so by your health care provider.  Try a clear liquid diet (broth, tea, or water) as directed by your health care provider. Slowly move to a bland diet as tolerated. SEEK MEDICAL CARE IF:  You have unexplained abdominal pain.  You have abdominal pain associated with nausea or diarrhea.  You have pain when you urinate or have a bowel movement.  You experience abdominal pain that wakes you in the night.  You have abdominal pain that is worsened or improved by eating food.  You have abdominal pain that is worsened with eating fatty foods.  You have a fever. SEEK IMMEDIATE MEDICAL CARE IF:  Your pain does not go away within 2 hours.  You keep throwing up (vomiting).  Your pain is felt only in portions of the abdomen, such as the right side or the left lower portion of the abdomen.  You pass bloody or black tarry stools. MAKE SURE YOU:  Understand these instructions.  Will watch your condition.  Will get help right away if you are not doing well or get  worse.   This information is not intended to replace advice given to you by your health care provider. Make sure you discuss any questions you have with your health care provider.   Document Released: 05/08/2005 Document Revised: 04/19/2015 Document Reviewed: 04/07/2013 Elsevier Interactive Patient Education [employer].

## 2015-10-19 NOTE — ED Provider Notes (Signed)
CSN: [id number]     Arrival date & time 10/19/15  0107 History   First MD Initiated Contact with Patient 10/19/15 [phone]     Chief Complaint  Patient presents with  . Abdominal Pain     (Consider location/radiation/quality/duration/timing/severity/associated sxs/prior Treatment) Patient is a 50 y.o. male presenting with abdominal pain. The history is provided by the patient.  Abdominal Pain He had onset about 10 PM of a sharp pain in the right upper abdomen with some radiation to the epigastric area into the back. There is mild nausea but no vomiting. He rated pain at 10/10. He took a dose of ibuprofen and pain has subsided somewhat. He had not eaten anything out of the ordinary. He has had similar pains twice before, approximately 1 month apart. In between, he has been pain-free. He does relate that last year he had a tree fall on him and he suffered rib fractures and possible hepatic injury and is concerned about whether the hepatic injury might be causing his pain. Is also worried about appendicitis.  Past Medical History  Diagnosis Date  . Diabetes mellitus   . Diabetes mellitus ([place])   . Hypertension   . Smoker 0    denies smoking  . History of blood clots   . Rib fracture   . Pneumothorax    Past Surgical History  Procedure Laterality Date  . Nose surgery    . Femur im nail Right 02/09/2015    Procedure: INTRAMEDULLARY (IM) RETROGRADE FEMORAL NAILING;  Surgeon: [name], MD;  Location: [place];  Service: Orthopedics;  Laterality: Right;  . External fixation leg Right 02/09/2015    Procedure: EXTERNAL FIXATION LEG;  Surgeon: [name], MD;  Location: [place];  Service: Orthopedics;  Laterality: Right;  . Orif ulnar fracture Right 02/09/2015    Procedure: OPEN REDUCTION INTERNAL FIXATION (ORIF) ULNAR FRACTURE;  Surgeon: [name], MD;  Location: [place];  Service: Orthopedics;  Laterality: Right;  . Orif ankle fracture Right 02/14/2015    Procedure: OPEN REDUCTION  INTERNAL FIXATION (ORIF) PILON FRACTURE;  Surgeon: [name], MD;  Location: [place];  Service: Orthopedics;  Laterality: Right;  . External fixation removal Right 02/14/2015    Procedure: REMOVAL EXTERNAL FIXATION LEG;  Surgeon: [name], MD;  Location: [place];  Service: Orthopedics;  Laterality: Right;  . Back surgery     Family History  Problem Relation Age of Onset  . Diabetes Mother   . COPD Mother    Social History  Substance Use Topics  . Smoking status: Never Smoker   . Smokeless tobacco: None  . Alcohol Use: No    Review of Systems  Gastrointestinal: Positive for abdominal pain.  All other systems reviewed and are negative.     Allergies  Bee venom and Bee venom  Home Medications   Prior to Admission medications   Medication Sig Start Date End Date Taking? Authorizing Provider  aspirin EC 81 MG tablet Take 81 mg by mouth daily.    Historical Provider, MD  insulin detemir (LEVEMIR) 100 UNIT/ML injection Inject 0.1 mLs (10 Units total) into the skin 2 (two) times daily. Patient taking differently: Inject 60 Units into the skin at bedtime.  03/16/15   Lavon Paganini Angiulli, PA-C  lisinopril (PRINIVIL,ZESTRIL) 10 MG tablet Take 1 tablet (10 mg total) by mouth daily. 03/16/15   Lavon Paganini Angiulli, PA-C  rivaroxaban (XARELTO) 20 MG TABS tablet Take 1 tablet (20 mg total) by mouth  daily. 09/13/15   Larene Pickett, PA-C  sitaGLIPtin-metformin (JANUMET) 50-1000 MG per tablet Take 1 tablet by mouth 2 (two) times daily with a meal. 03/16/15   Lavon Paganini Angiulli, PA-C   BP 178/72 mmHg  Temp(Src) 97.6 F (36.4 C) (Oral)  Resp 18  Ht 5\' 11"  (1.803 m)  Wt 235 lb (106.595 kg)  BMI 32.79 kg/m2  SpO2 100% Physical Exam  Nursing note and vitals reviewed.  50 year old male, resting comfortably and in no acute distress. Vital signs are significant for hypertension. Oxygen saturation is 100%, which is normal. Head is normocephalic and atraumatic. PERRLA, EOMI. Oropharynx is  clear. Neck is nontender and supple without adenopathy or JVD. Back is nontender and there is no CVA tenderness. Lungs are clear without rales, wheezes, or rhonchi. Chest is nontender. Heart has regular rate and rhythm without murmur. Abdomen is soft, flat, with mild right upper quadrant tenderness. There is no rebound or guarding. There are no masses or hepatosplenomegaly and peristalsis is normoactive. Extremities have no cyanosis or edema, full range of motion is present. Skin is warm and dry without rash. Neurologic: Mental status is normal, cranial nerves are intact, there are no motor or sensory deficits.  ED Course  Procedures (including critical care time) Labs Review Results for orders placed or performed during the hospital encounter of 10/19/15  CBC with Differential  Result Value Ref Range   WBC 6.3 4.0 - 10.5 K/uL   RBC 4.38 4.22 - 5.81 MIL/uL   Hemoglobin 12.3 (L) 13.0 - 17.0 g/dL   HCT 37.1 (L) 39.0 - 52.0 %   MCV 84.7 78.0 - 100.0 fL   MCH 28.1 26.0 - 34.0 pg   MCHC 33.2 30.0 - 36.0 g/dL   RDW 14.0 11.5 - 15.5 %   Platelets 191 150 - 400 K/uL   Neutrophils Relative % 57 %   Neutro Abs 3.6 1.7 - 7.7 K/uL   Lymphocytes Relative 31 %   Lymphs Abs 2.0 0.7 - 4.0 K/uL   Monocytes Relative 10 %   Monocytes Absolute 0.7 0.1 - 1.0 K/uL   Eosinophils Relative 2 %   Eosinophils Absolute 0.1 0.0 - 0.7 K/uL   Basophils Relative 0 %   Basophils Absolute 0.0 0.0 - 0.1 K/uL  Comprehensive metabolic panel  Result Value Ref Range   Sodium 139 135 - 145 mmol/L   Potassium 3.9 3.5 - 5.1 mmol/L   Chloride 108 101 - 111 mmol/L   CO2 25 22 - 32 mmol/L   Glucose, Bld 121 (H) 65 - 99 mg/dL   BUN 24 (H) 6 - 20 mg/dL   Creatinine, Ser 1.14 0.61 - 1.24 mg/dL   Calcium 8.5 (L) 8.9 - 10.3 mg/dL   Total Protein 7.3 6.5 - 8.1 g/dL   Albumin 3.9 3.5 - 5.0 g/dL   AST 19 15 - 41 U/L   ALT 15 (L) 17 - 63 U/L   Alkaline Phosphatase 83 38 - 126 U/L   Total Bilirubin 0.5 0.3 - 1.2 mg/dL    GFR calc non Af Amer >60 >60 mL/min   GFR calc Af Amer >60 >60 mL/min   Anion gap 6 5 - 15  Lipase, blood  Result Value Ref Range   Lipase 44 11 - 51 U/L   I have personally reviewed and evaluated these lab results as part of my medical decision-making.  MDM   Final diagnoses:  Upper abdominal pain  History of DVT of lower extremity  Upper abdominal pain which appears to be benign. Laboratory workup shows normal WBC and normal lipase and normal transaminases. Exam shows only mild tenderness and no evidence of peritonitis. No right lower quadrant tenderness. He will be given a GI cocktail and a dose of pantoprazole and reassessed. Also, it is noted that he is supposed to be taking rivaroxaban. Review of old records shows a hospitalization for the injury from the tree falling on him. That was last July with discharged last August. 2 recent ED visits-1 in December 1 in January, or for chest pain which apparently is the same pain he is having today. On both occasions, he was given a voucher for rivaroxaban which she never filled. His DVT was related to the episode of trauma so would probably be reasonable to repeat venous ultrasounds at some point to see if he has any ongoing clots. If not, it would justify his being off of anticoagulants.   Patient relates significant improvement with above noted treatment. He is concerned that his PCP will not order his venous Dopplers so they were ordered from your. He'll return in the morning. If negative, he will not need to be on additional anticoagulation. He is advised to take omeprazole once a day.   Delora Fuel, MD [id number]

## 2015-10-25 ENCOUNTER — Emergency Department (HOSPITAL_COMMUNITY): Payer: Self-pay

## 2015-10-25 ENCOUNTER — Encounter (HOSPITAL_COMMUNITY): Payer: Self-pay | Admitting: Emergency Medicine

## 2015-10-25 ENCOUNTER — Emergency Department (HOSPITAL_COMMUNITY)
Admission: EM | Admit: 2015-10-25 | Discharge: 2015-10-25 | Disposition: A | Discharge status: Home / Self Care | Payer: Self-pay | Attending: Emergency Medicine | Admitting: Emergency Medicine

## 2015-10-25 DIAGNOSIS — I10 Essential (primary) hypertension: Secondary | ICD-10-CM | POA: Insufficient documentation

## 2015-10-25 DIAGNOSIS — E119 Type 2 diabetes mellitus without complications: Secondary | ICD-10-CM | POA: Insufficient documentation

## 2015-10-25 DIAGNOSIS — Z7984 Long term (current) use of oral hypoglycemic drugs: Secondary | ICD-10-CM | POA: Insufficient documentation

## 2015-10-25 DIAGNOSIS — Z794 Long term (current) use of insulin: Secondary | ICD-10-CM | POA: Insufficient documentation

## 2015-10-25 DIAGNOSIS — R0789 Other chest pain: Secondary | ICD-10-CM | POA: Insufficient documentation

## 2015-10-25 MED ORDER — CYCLOBENZAPRINE HCL 10 MG PO TABS: 10.0000 mg | ORAL_TABLET | Freq: Three times a day (TID) | ORAL | Status: DC | PRN

## 2015-10-25 MED ORDER — CYCLOBENZAPRINE HCL 10 MG PO TABS
10.0000 mg | ORAL_TABLET | Freq: Once | ORAL | Status: AC
  Administered 2015-10-25: 10 mg via ORAL
  Filled 2015-10-25: qty 1

## 2015-10-25 MED ORDER — KETOROLAC TROMETHAMINE 30 MG/ML IJ SOLN
30.0000 mg | Freq: Once | INTRAMUSCULAR | Status: AC
  Administered 2015-10-25: 30 mg via INTRAVENOUS
  Filled 2015-10-25: qty 1

## 2015-10-25 NOTE — ED Notes (Signed)
Pt reports sudden onset of chest pain and states he was here several weeks ago for the same thing- He also reports a bad tooth for which he has seen the dentist, given antibiotics ad has a return appointment on Monday. He reports that he desires to be referred to a pain clinic, but has no insurance, lives alone and has been denied disability two times

## 2015-10-25 NOTE — ED Provider Notes (Signed)
CSN: [id number]     Arrival date & time 10/25/15  0041 History   First MD Initiated Contact with Patient 10/25/15 0150   Chief Complaint  Patient presents with  . Chest Pain     (Consider location/radiation/quality/duration/timing/severity/associated sxs/prior Treatment) HPI patient states about 12 midnight he was sitting in a chair and had acute onset of right-sided chest pain that radiates to the center of his chest into his back. He states this is similar to pains he had before since he had a crushed chest when a tree fell on his chest in June. He denies feeling short of breath, having a cough, fever, diaphoresis, nausea, or vomiting. He states he took some over-the-counter meds without relief.  Patient states he saw a dentist on March 13 and he is currently on amoxicillin. He is going to have a tooth pulled when he finishes the antibiotics in 5 days.  PCP [facility] Department   Past Medical History  Diagnosis Date  . Diabetes mellitus   . Diabetes mellitus ([place])   . Hypertension   . Smoker 0    denies smoking  . History of blood clots   . Rib fracture   . Pneumothorax    Past Surgical History  Procedure Laterality Date  . Nose surgery    . Femur im nail Right 02/09/2015    Procedure: INTRAMEDULLARY (IM) RETROGRADE FEMORAL NAILING;  Surgeon: [name], MD;  Location: [place];  Service: Orthopedics;  Laterality: Right;  . External fixation leg Right 02/09/2015    Procedure: EXTERNAL FIXATION LEG;  Surgeon: [name], MD;  Location: [place];  Service: Orthopedics;  Laterality: Right;  . Orif ulnar fracture Right 02/09/2015    Procedure: OPEN REDUCTION INTERNAL FIXATION (ORIF) ULNAR FRACTURE;  Surgeon: [name], MD;  Location: [place];  Service: Orthopedics;  Laterality: Right;  . Orif ankle fracture Right 02/14/2015    Procedure: OPEN REDUCTION INTERNAL FIXATION (ORIF) PILON FRACTURE;  Surgeon: [name], MD;  Location: [place];  Service:  Orthopedics;  Laterality: Right;  . External fixation removal Right 02/14/2015    Procedure: REMOVAL EXTERNAL FIXATION LEG;  Surgeon: [name], MD;  Location: [place];  Service: Orthopedics;  Laterality: Right;  . Back surgery     Family History  Problem Relation Age of Onset  . Diabetes Mother   . COPD Mother    Social History  Substance Use Topics  . Smoking status: Never Smoker   . Smokeless tobacco: None  . Alcohol Use: No  lives alone  Review of Systems  All other systems reviewed and are negative.     Allergies  Bee venom and Bee venom  Home Medications   Prior to Admission medications   Medication Sig Start Date End Date Taking? Authorizing Provider  aspirin EC 81 MG tablet Take 81 mg by mouth daily.    Historical Provider, MD  cyclobenzaprine (FLEXERIL) 10 MG tablet Take 1 tablet (10 mg total) by mouth 3 (three) times daily as needed (muscle soreness). 10/25/15   Rolland Porter, MD  insulin detemir (LEVEMIR) 100 UNIT/ML injection Inject 0.1 mLs (10 Units total) into the skin 2 (two) times daily. Patient taking differently: Inject 60 Units into the skin at bedtime.  03/16/15   Lavon Paganini Angiulli, PA-C  lisinopril (PRINIVIL,ZESTRIL) 10 MG tablet Take 1 tablet (10 mg total) by mouth daily. 03/16/15   Lavon Paganini Angiulli, PA-C  rivaroxaban (XARELTO) 20 MG TABS tablet Take 1 tablet (  20 mg total) by mouth daily. 09/13/15   Larene Pickett, PA-C  sitaGLIPtin-metformin (JANUMET) 50-1000 MG per tablet Take 1 tablet by mouth 2 (two) times daily with a meal. 03/16/15   Lavon Paganini Angiulli, PA-C   BP 181/80 mmHg  Pulse 66  Temp(Src) 98.2 F (36.8 C)  Resp 14  Ht 5\' 11"  (1.803 m)  Wt 235 lb (106.595 kg)  BMI 32.79 kg/m2  Vital signs normal except hypertension  Physical Exam  Constitutional: He is oriented to person, place, and time. He appears well-developed and well-nourished.  Non-toxic appearance. He does not appear ill. No distress.  HENT:  Head: Normocephalic and atraumatic.   Right Ear: External ear normal.  Left Ear: External ear normal.  Nose: Nose normal. No mucosal edema or rhinorrhea.  Mouth/Throat: Oropharynx is clear and moist and mucous membranes are normal. No dental abscesses or uvula swelling.  Eyes: Conjunctivae and EOM are normal. Pupils are equal, round, and reactive to light.  Neck: Normal range of motion and full passive range of motion without pain. Neck supple.  Cardiovascular: Normal rate, regular rhythm and normal heart sounds.  Exam reveals no gallop and no friction rub.   No murmur heard. Pulmonary/Chest: Effort normal and breath sounds normal. No respiratory distress. He has no wheezes. He has no rhonchi. He has no rales. He exhibits no tenderness and no crepitus.  Chest is nontender to palpation  Abdominal: Soft. Normal appearance and bowel sounds are normal. He exhibits no distension. There is no tenderness. There is no rebound and no guarding.  Musculoskeletal: Normal range of motion. He exhibits no edema or tenderness.  Moves all extremities well.   Neurological: He is alert and oriented to person, place, and time. He has normal strength. No cranial nerve deficit.  Skin: Skin is warm, dry and intact. No rash noted. No erythema. No pallor.  Psychiatric: He has a normal mood and affect. His speech is normal and behavior is normal. His mood appears not anxious.  Nursing note and vitals reviewed.   ED Course  Procedures (including critical care time)  Medications  ketorolac (TORADOL) 30 MG/ML injection 30 mg (30 mg Intravenous Given 10/25/15 0209)  cyclobenzaprine (FLEXERIL) tablet 10 mg (10 mg Oral Given 10/25/15 0209)     Recheck 2:55 AM patient is feeling better. We discussed they can take a while for these rib injuries to heal.   Imaging Review Dg Chest 2 View  10/25/2015  CLINICAL DATA:  Chronic right rib pain. EXAM: CHEST  2 VIEW COMPARISON:  09/13/2015 FINDINGS: Extensive distortion and heterotopic ossification related to  remote right rib fractures. There is no edema, consolidation, effusion, or pneumothorax. Stable borderline cardiomegaly. Negative aortic and hilar contours. Remote T11 superior endplate fracture. IMPRESSION: No evidence of acute cardiopulmonary disease. Electronically Signed   By: Monte Fantasia M.D.   On: 10/25/2015 02:22   I have personally reviewed and evaluated these images and lab results as part of my medical decision-making.   EKG Interpretation   Date/Time:  Wednesday October 25 2015 00:52:15 EDT Ventricular Rate:  68 PR Interval:  156 QRS Duration: 87 QT Interval:  374 QTC Calculation: 398 R Axis:   38 Text Interpretation:  Sinus rhythm Normal ECG No significant change since  last tracing 13 Sep 2015 Confirmed by Tikisha Molinaro  MD-I, Elizabet Schweppe ([id number]) on  10/25/2015 1:10:05 AM      MDM   Final diagnoses:  Right-sided chest wall pain   New Prescriptions   CYCLOBENZAPRINE (FLEXERIL)  10 MG TABLET    Take 1 tablet (10 mg total) by mouth 3 (three) times daily as needed (muscle soreness).    Plan discharge  Rolland Porter, MD, Barbette Or, MD 10/25/15 [id number]

## 2015-10-25 NOTE — Discharge Instructions (Signed)
Use ice and heat on your chest for comfort. Take the Flexeril for your chest wall pain. Recheck if you get fever, struggle to breathe, or seems worse.   Chest Wall Pain Chest wall pain is pain in or around the bones and muscles of your chest. Sometimes, an injury causes this pain. Sometimes, the cause may not be known. This pain may take several weeks or longer to get better. HOME CARE INSTRUCTIONS  Pay attention to any changes in your symptoms. Take these actions to help with your pain:   Rest as told by your health care provider.   Avoid activities that cause pain. These include any activities that use your chest muscles or your abdominal and side muscles to lift heavy items.   If directed, apply ice to the painful area:  Put ice in a plastic bag.  Place a towel between your skin and the bag.  Leave the ice on for 20 minutes, 2-3 times per day.  Take over-the-counter and prescription medicines only as told by your health care provider.  Do not use tobacco products, including cigarettes, chewing tobacco, and e-cigarettes. If you need help quitting, ask your health care provider.  Keep all follow-up visits as told by your health care provider. This is important. SEEK MEDICAL CARE IF:  You have a fever.  Your chest pain becomes worse.  You have new symptoms. SEEK IMMEDIATE MEDICAL CARE IF:  You have nausea or vomiting.  You feel sweaty or light-headed.  You have a cough with phlegm (sputum) or you cough up blood.  You develop shortness of breath.   This information is not intended to replace advice given to you by your health care provider. Make sure you discuss any questions you have with your health care provider.   Document Released: 07/29/2005 Document Revised: 04/19/2015 Document Reviewed: 10/24/2014 Elsevier Interactive Patient Education [employer].

## 2015-10-25 NOTE — ED Notes (Signed)
Pt DCd per MD instruct. Verbalizes understanding of DC instruct, med regime and follow up- To exit ambulatory

## 2015-10-25 NOTE — ED Notes (Signed)
Pt c/o chest pain that radiates to the back with sob.

## 2015-11-13 ENCOUNTER — Ambulatory Visit (HOSPITAL_BASED_OUTPATIENT_CLINIC_OR_DEPARTMENT_OTHER): Payer: Self-pay | Admitting: Physical Medicine & Rehabilitation

## 2015-11-13 ENCOUNTER — Encounter: Payer: Self-pay | Admitting: Physical Medicine & Rehabilitation

## 2015-11-13 ENCOUNTER — Encounter: Payer: Self-pay | Attending: Physical Medicine & Rehabilitation

## 2015-11-13 VITALS — BP 191/91 | HR 73

## 2015-11-13 DIAGNOSIS — Z79899 Other long term (current) drug therapy: Secondary | ICD-10-CM

## 2015-11-13 DIAGNOSIS — M961 Postlaminectomy syndrome, not elsewhere classified: Secondary | ICD-10-CM

## 2015-11-13 DIAGNOSIS — J939 Pneumothorax, unspecified: Secondary | ICD-10-CM | POA: Insufficient documentation

## 2015-11-13 DIAGNOSIS — E119 Type 2 diabetes mellitus without complications: Secondary | ICD-10-CM

## 2015-11-13 DIAGNOSIS — G8921 Chronic pain due to trauma: Secondary | ICD-10-CM

## 2015-11-13 DIAGNOSIS — IMO0001 Reserved for inherently not codable concepts without codable children: Secondary | ICD-10-CM

## 2015-11-13 DIAGNOSIS — Z5181 Encounter for therapeutic drug level monitoring: Secondary | ICD-10-CM

## 2015-11-13 DIAGNOSIS — S2241XS Multiple fractures of ribs, right side, sequela: Secondary | ICD-10-CM | POA: Insufficient documentation

## 2015-11-13 DIAGNOSIS — Z794 Long term (current) use of insulin: Secondary | ICD-10-CM

## 2015-11-13 DIAGNOSIS — I10 Essential (primary) hypertension: Secondary | ICD-10-CM | POA: Insufficient documentation

## 2015-11-13 DIAGNOSIS — Z86718 Personal history of other venous thrombosis and embolism: Secondary | ICD-10-CM | POA: Insufficient documentation

## 2015-11-13 HISTORY — DX: Postlaminectomy syndrome, not elsewhere classified: M96.1

## 2015-11-13 MED ORDER — TRAMADOL HCL 50 MG PO TABS: 50.0000 mg | ORAL_TABLET | Freq: Three times a day (TID) | ORAL | Status: DC | PRN

## 2015-11-13 MED ORDER — GABAPENTIN 100 MG PO CAPS: 100.0000 mg | ORAL_CAPSULE | Freq: Three times a day (TID) | ORAL | Status: DC

## 2015-11-13 NOTE — Addendum Note (Signed)
Addended by: Caro Hight on: 11/13/2015 01:36 PM   Modules accepted: Orders

## 2015-11-13 NOTE — Patient Instructions (Signed)
Your right-sided chest pain is related to the multiple rib fractures as well as nerve irritation. The gabapentin is for the nerve pain. The tramadol is for the bone pain We may need to adjust your medication dosages next month.

## 2015-11-13 NOTE — Progress Notes (Signed)
Subjective:    Patient ID: Jeremy Burgess, male    DOB: Dec 15, 1965, 50 y.o.   MRN: [id number] 50 year old male, with history of hypertension and diabetes mellitus, admitted on February 08, 2015, after a tree he was cutting fell on him. Complaints of right torso, abdominal, and hip pain. Workup revealed right displaced 2 through 9 rib fractures, moderate right pneumothorax, right femoral shaft fracture, right pilon fracture, and right ulnar fracture. Developed hypotension and shock in the ED, decreased level of consciousness, required intubation as well as pressors. Right chest tube was placed for treatment of pneumothorax as well as receiving 4 units of packed red blood cells. He was taken to the operating room for intramedullary nail right femur, ORIF of right ulna and external fixation of right ankle per Dr. Percell Miller. HPI Patient follows up from inpatient rehabilitation in July and August 2016. He has followed up with Dr. Percell Miller from orthopedics and was released from his care in October. He goes to the [facility] for his diabetes and hypertension management. The patient has been seen in the emergency department on 3 occasions at [facility]. This has been for chest pain on the right side. The patient had multiple rib fractures on that area. He was treated for lower extremity DVT. CT angiogram of the chest demonstrated no pulmonary emboli. Repeat lower extremity Doppler showed no evidence of DVT. Patient had cardiac enzymes and EKGs that showed no evidence of Myocardial infarct.  Patient states that he is not taking any medicines for pain. He has taken 3 Aleve tablets today.  His primary pain concern is his right side of his chest, he also has chronic low back pain since his surgery in 1987. He also has right side of the neck pain. Patient had a CT scan of his neck as well as had. Other than some cervical spondylosis he had 2 right posterior rib fractures T2  and T3 Patient had left knee MRI  Was told by orthopedic surgeon that he had excess bone in his right thigh. Patient is off   Pain Inventory Average Pain 10 Pain Right Now 10 My pain is sharp, burning, dull, stabbing, tingling and aching  In the last 24 hours, has pain interfered with the following? General activity 1 Relation with others 1 Enjoyment of life 0 What TIME of day is your pain at its worst? all Sleep (in general) Poor  Pain is worse with: walking, bending, sitting and standing Pain improves with: rest Relief from Meds: no meds  Mobility use a cane ability to climb steps?  no do you drive?  no needs help with transfers  Function not employed: date last employed 2013 I need assistance with the following:  dressing, bathing, meal prep, household duties and shopping  Neuro/Psych weakness numbness tremor tingling trouble walking spasms dizziness confusion depression anxiety  Prior Studies Any changes since last visit?  yes  Has been to ED x 3 for pain CLINICAL DATA: Patient sustained trauma from a tree accident on 02/22/2015. Complaining of abdominal and hip pain as well as fever.  EXAM: CT ABDOMEN AND PELVIS WITH CONTRAST  TECHNIQUE: Multidetector CT imaging of the abdomen and pelvis was performed using the standard protocol following bolus administration of intravenous contrast.  CONTRAST: 129mL OMNIPAQUE IOHEXOL 300 MG/ML SOLN  COMPARISON: CT chest, abdomen pelvis, 02/08/2015.  FINDINGS: Lung bases: Multiple right-sided rib fractures. These are incompletely imaged. There are posterior nondisplaced fractures from the seventh through the tenth  ribs. There are displaced fractures of the lateral seventh, eighth, ninth and tenth ribs. An anterior fractures seen of the sixth rib, nondisplaced. No anterior pneumothorax. Small focus of air is seen in the lateral pleural space. No other evidence of pleural space air. There is  some subcutaneous air along the lateral chest wall with associated stranding. Small right pleural effusion. There is associated dependent right lower lobe opacity consistent with atelectasis. There is a minimal left pleural effusion an a lesser degree of dependent left lower lobe atelectasis. Heart is borderline enlarged.  Liver: Small area of hypoattenuation noted along the posterior inferior right lobe of the liver. This may reflect a small contusion. It is similar to the prior study. No other liver abnormality.  Spleen, gallbladder, pancreas: Unremarkable.  Adrenal glands: 3.4 cm right adrenal mass measuring 20 Hounsfield units on the initial post-contrast sequence and 26 Hounsfield units on the delayed sequence. This is nonspecific. Normal left adrenal gland.  Kidneys, ureters, bladder: No renal contusion or laceration. No renal masses or stones. No hydronephrosis. Ureters and bladder are unremarkable.  Lymph nodes: No enlarged or abnormal appearing lymph nodes.  Ascites: None.  Gastrointestinal: Mild dilation of the right colon. Mildly prominent loops of small bowel. There are multiple air-fluid levels. No bowel wall thickening to suggest a hematoma or inflammatory change. No mesenteric hematoma. Findings consistent with a mild adynamic ileus. Normal appendix seen.  Musculoskeletal: Rib fractures as described above. Intra medullary rod has been placed in the right femur since the prior study. Right lateral abdominal wall and pelvic soft tissue edema at is noted consistent with a combination of posttraumatic edema/ hemorrhage and postoperative change. 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  IMPRESSION: 1. When compared to the previous exam, there has been improvement. Multiple right-sided rib fractures are again noted. There is only minimal residual subcutaneous air. One small focus of pleural air is seen, but there  is no anterior pneumothorax. Pleural fluid on the right has decreased. However, atelectasis in both lower lobes has increased. Superimposed pneumonia is possible. 2. Small low-density area along the posterior inferior margin of the right lobe of the liver may reflect a benign lesion such as a cyst. It could reflect a minimal contusion or laceration. The former is felt more likely. This was not well displayed on the prior exam. 3. Air-fluid levels in the colon small bowel with mild right colon dilation. This is consistent with a mild diffuse adynamic ileus. 4. Postoperative changes from the intra medullary rod placement through the right femur, new from prior exam. 5. Right lateral abdominal and pelvic soft tissue edema/contusion. There is small irregular fluid collection in the right lateral lower abdominal wall measuring 5.1 cm x 1.9 cm x 5.1 cm. This is new from the prior exam. Although this could be an infected fluid collection, is most likely a sterile fluid collection.   Electronically Signed  By: Lajean Manes M.D.  On: 02/23/2015 18:17  CT CERVICAL SPINE FINDINGS  Alignment: Mild levoconvex torticollis, likely positional. No dislocation.  Craniocervical junction: Atlantodental degenerative disease. The odontoid is intact. Occipital condyles intact. C1 ring appears normal.  Vertebrae: Cervical vertebral body height is preserved. Negative for fracture.  Paraspinal soft tissues: Soft tissue emphysema is present in the RIGHT neck, tracking cranially from the chest.  Lung apices: The pneumothorax on the chest CT is not visible at the lung apex on the RIGHT. There is pleural thickening at the RIGHT apex, probably  related hemothorax. Posterior RIGHT second and third rib fractures are noted.  Mild cervical spine degenerative disease. Disc protrusions are present at C3-C4 and C4-C5. C4-C5 vacuum disc. Central canal grossly appears adequately patent. Carotid  atherosclerosis incidentally noted.  IMPRESSION: 1. Negative CT head. 2. No cervical spine fracture or dislocation. 3. Nondisplaced RIGHT posterior second and third rib fractures. 4. Soft tissue emphysema in the RIGHT neck tracking from the chest. 5. Pleural thickening at the RIGHT lung apex, likely representing hemothorax.  Electronically Signed: By: Dereck Ligas M.D. On: 02/08/2015 11:27   CLINICAL DATA: History of trauma, anterior left knee pain.  EXAM: MRI OF THE LEFT KNEE WITHOUT CONTRAST  TECHNIQUE: Multiplanar, multisequence MR imaging of the knee was performed. No intravenous contrast was administered.  COMPARISON: None.  FINDINGS: MENISCI  Medial meniscus: Intact.  Lateral meniscus: Intact.  LIGAMENTS  Cruciates: Intact ACL and PCL.  Collaterals: Medial collateral ligament is intact. Lateral collateral ligament complex is intact.  CARTILAGE  Patellofemoral: High-grade partial-thickness cartilage loss of the peripheral lateral trochlea with subchondral reactive marrow edema.  Medial: No focal chondral defect.  Lateral: No focal chondral defect.  Joint: Normal Hoffa's fat. No joint effusion. No plical thickening.  Popliteal Fossa: No Baker cyst. Intact popliteus tendon.  Extensor Mechanism: Intact patellar tendon. Focal area of T2 hyperintense signal in the anterior central quadriceps tendon distally, just above the patella, with overlying soft tissue edema.  Bones: No marrow signal abnormality. No fracture or dislocation.  IMPRESSION: 1. Focal area of T2 hyperintense signal in the anterior central quadriceps tendon distally, just above the patella. Differential considerations include severe tendinosis with a small partial tear versus gout of the quadriceps tendon. Correlate with laboratory values. 2. High-grade partial-thickness cartilage loss of the peripheral lateral trochlea with subchondral reactive  marrow edema.   Electronically Signed  By: Kathreen Devoid  On: 03/05/2015 16:11    Physicians involved in your care Any changes since last visit?  no   Family History  Problem Relation Age of Onset  . Diabetes Mother   . COPD Mother    Social History   Social History  . Marital Status: Single    Spouse Name: N/A  . Number of Children: N/A  . Years of Education: N/A   Social History Main Topics  . Smoking status: Never Smoker   . Smokeless tobacco: None  . Alcohol Use: No  . Drug Use: No  . Sexual Activity: Not Asked   Other Topics Concern  . None   Social History Narrative   ** Merged History Encounter **       Past Surgical History  Procedure Laterality Date  . Nose surgery    . Femur im nail Right 02/09/2015    Procedure: INTRAMEDULLARY (IM) RETROGRADE FEMORAL NAILING;  Surgeon: [name], MD;  Location: [place];  Service: Orthopedics;  Laterality: Right;  . External fixation leg Right 02/09/2015    Procedure: EXTERNAL FIXATION LEG;  Surgeon: [name], MD;  Location: [place];  Service: Orthopedics;  Laterality: Right;  . Orif ulnar fracture Right 02/09/2015    Procedure: OPEN REDUCTION INTERNAL FIXATION (ORIF) ULNAR FRACTURE;  Surgeon: [name], MD;  Location: [place];  Service: Orthopedics;  Laterality: Right;  . Orif ankle fracture Right 02/14/2015    Procedure: OPEN REDUCTION INTERNAL FIXATION (ORIF) PILON FRACTURE;  Surgeon: [name], MD;  Location: [place];  Service: Orthopedics;  Laterality: Right;  . External fixation removal Right 02/14/2015  Procedure: REMOVAL EXTERNAL FIXATION LEG;  Surgeon: [name], MD;  Location: [place];  Service: Orthopedics;  Laterality: Right;  . Back surgery     Past Medical History  Diagnosis Date  . Diabetes mellitus   . Diabetes mellitus ([place])   . Hypertension   . Smoker 0    denies smoking  . History of blood clots   . Rib fracture   . Pneumothorax    BP 191/91 mmHg  Pulse 73  SpO2  97%  Opioid Risk Score:   Fall Risk Score:  `1  Depression screen PHQ 2/9  Depression screen PHQ 2/9 11/13/2015  Decreased Interest 3  Down, Depressed, Hopeless 3  PHQ - 2 Score 6  Altered sleeping 3  Tired, decreased energy 3  Change in appetite 3  Feeling bad or failure about yourself  3  Trouble concentrating 3  Moving slowly or fidgety/restless 3  Suicidal thoughts 0  PHQ-9 Score 24     Review of Systems  Constitutional: Positive for chills and diaphoresis.  Respiratory: Positive for cough and shortness of breath.   Cardiovascular: Positive for leg swelling.  Gastrointestinal: Positive for nausea, vomiting and abdominal pain.  Endocrine:       High blood sugar  All other systems reviewed and are negative.      Objective:   Physical Exam  Constitutional: He is oriented to person, place, and time. He appears well-developed and well-nourished.  HENT:  Head: Normocephalic and atraumatic.  Eyes: Conjunctivae and EOM are normal. Pupils are equal, round, and reactive to light.  Neck: Normal range of motion.  Neurological: He is alert and oriented to person, place, and time. He displays no atrophy. No sensory deficit. He exhibits normal muscle tone. Gait abnormal.  Antalgic gait, forward flexed at the hip, no evidence of toe drag or knee instability  Psychiatric: He has a normal mood and affect.  Nursing note and vitals reviewed.   Unable supinated right forearm Lumbar spine with limited range of motion but no evidence of deformity healed midline lumbar incision. Limited cervical range of motion 50% flexion extension lateral bending and rotation. Limited right shoulder internal rotation has intact external rotation. Patient has some tenderness over the right trapezius. Positive Tinel's radiating pain into the right index finger. Tenderness over the right lower anterior lateral ribs Tenderness and mass over the right rectus femoris muscle.     Assessment & Plan:  1.   Chronic pain due to trauma,Had a tree fall on him approximately 9 months ago.  Multiple rib fractures on the right side, right femur fracture. 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 I reviewed his imaging studies as above and he has not had any new trauma.  We will start gabapentin 100 mg 3 times a day Start tramadol 50 mg 3 times a day We'll likely need to titrate upward, re-eval in 4 weeks. Urine drug screen today in case we move on to stronger narcotic analgesics.  2. Diabetes may predispose him to carpal tunnel, he will follow-up with [facility]

## 2015-11-16 LAB — TOXASSURE SELECT,+ANTIDEPR,UR: PDF: 0

## 2015-11-17 NOTE — Progress Notes (Signed)
Urine drug screen for this encounter is consistent for no prescribed medication. 

## 2015-12-11 ENCOUNTER — Encounter: Payer: Self-pay | Attending: Physical Medicine & Rehabilitation

## 2015-12-11 ENCOUNTER — Ambulatory Visit (HOSPITAL_BASED_OUTPATIENT_CLINIC_OR_DEPARTMENT_OTHER): Payer: Self-pay | Admitting: Physical Medicine & Rehabilitation

## 2015-12-11 ENCOUNTER — Encounter: Payer: Self-pay | Admitting: Physical Medicine & Rehabilitation

## 2015-12-11 VITALS — BP 159/77 | HR 67

## 2015-12-11 DIAGNOSIS — G8921 Chronic pain due to trauma: Secondary | ICD-10-CM

## 2015-12-11 DIAGNOSIS — I10 Essential (primary) hypertension: Secondary | ICD-10-CM | POA: Insufficient documentation

## 2015-12-11 DIAGNOSIS — S2241XS Multiple fractures of ribs, right side, sequela: Secondary | ICD-10-CM | POA: Insufficient documentation

## 2015-12-11 DIAGNOSIS — Z86718 Personal history of other venous thrombosis and embolism: Secondary | ICD-10-CM | POA: Insufficient documentation

## 2015-12-11 DIAGNOSIS — Z5181 Encounter for therapeutic drug level monitoring: Secondary | ICD-10-CM | POA: Insufficient documentation

## 2015-12-11 DIAGNOSIS — J939 Pneumothorax, unspecified: Secondary | ICD-10-CM | POA: Insufficient documentation

## 2015-12-11 DIAGNOSIS — M961 Postlaminectomy syndrome, not elsewhere classified: Secondary | ICD-10-CM | POA: Insufficient documentation

## 2015-12-11 DIAGNOSIS — E119 Type 2 diabetes mellitus without complications: Secondary | ICD-10-CM | POA: Insufficient documentation

## 2015-12-11 DIAGNOSIS — Z79899 Other long term (current) drug therapy: Secondary | ICD-10-CM | POA: Insufficient documentation

## 2015-12-11 MED ORDER — TRAMADOL HCL 50 MG PO TABS: 50.0000 mg | ORAL_TABLET | Freq: Four times a day (QID) | ORAL | Status: DC

## 2015-12-11 MED ORDER — DICLOFENAC SODIUM 1 % TD GEL: 2.0000 g | Freq: Four times a day (QID) | TRANSDERMAL | Status: DC

## 2015-12-11 NOTE — Patient Instructions (Signed)
We'll start a new medicine called Voltaren gel this can be placed on your right knee 4 times a day

## 2015-12-11 NOTE — Progress Notes (Signed)
Subjective:    Patient ID: Jeremy Burgess, male    DOB: [birth year], 50 y.o.   MRN: [id number] 50 year old male, with history of hypertension and diabetes mellitus, admitted on February 08, 2015, after a tree he was cutting fell on him. Complaints of right torso, abdominal, and hip pain. Workup revealed right displaced 2 through 9 rib fractures, moderate right pneumothorax, right femoral shaft fracture, right pilon fracture, and right ulnar fracture. Developed hypotension and shock in the ED, decreased level of consciousness, required intubation as well as pressors. Right chest tube was placed for treatment of pneumothorax as well as receiving 4 units of packed red blood cells. He was taken to the operating room for intramedullary nail right femur, ORIF of right ulna and external fixation of right ankle per Dr. Percell Miller. HPI Patient follows up from inpatient rehabilitation in July and August 2016. He has followed up with Dr. Percell Miller from orthopedics and was released from his care in October. He goes to [facility] [place] for his diabetes and hypertension management. The patient has been seen in the emergency department on 3 occasions at [facility]. This has been for chest pain on the right side. The patient had multiple rib fractures on that area. He was treated for lower extremity DVT. CT angiogram of the chest demonstrated no pulmonary emboli. Repeat lower extremity Doppler showed no evidence of DVT. Patient had cardiac enzymes and EKGs that showed no evidence of Myocardial infarct.   HPI Patient had nausea with gabapentin started at last visit on 4/3, no longer taking this Getting decent relief from tramadol 50 3 times a day although it feels like some time of day he would like to take another tablet  Chief complaint today is right knee pain. He's had no new trauma to that area. He does have a history of femoral fracture approximately one year ago. No catching  no swelling no erythema noted. Pain Inventory Average Pain 8 Pain Right Now 8 My pain is sharp, burning, stabbing, tingling and aching  In the last 24 hours, has pain interfered with the following? General activity 1 Relation with others 2 Enjoyment of life 0 What TIME of day is your pain at its worst? all Sleep (in general) Poor  Pain is worse with: walking, bending, sitting, inactivity and standing Pain improves with: medication Relief from Meds: 1  Mobility walk without assistance use a cane how many minutes can you walk? 15 ability to climb steps?  no do you drive?  no  Function not employed: date last employed 15 I need assistance with the following:  feeding, dressing, bathing, meal prep, household duties and shopping  Neuro/Psych numbness tingling trouble walking spasms depression anxiety  Prior Studies Any changes since last visit?  no  Physicians involved in your care Any changes since last visit?  no   Family History  Problem Relation Age of Onset  . Diabetes Mother   . COPD Mother    Social History   Social History  . Marital Status: Single    Spouse Name: N/A  . Number of Children: N/A  . Years of Education: N/A   Social History Main Topics  . Smoking status: Never Smoker   . Smokeless tobacco: None  . Alcohol Use: No  . Drug Use: No  . Sexual Activity: Not Asked   Other Topics Concern  . None   Social History Narrative   ** Merged History Encounter **  Past Surgical History  Procedure Laterality Date  . Nose surgery    . Femur im nail Right 02/09/2015    Procedure: INTRAMEDULLARY (IM) RETROGRADE FEMORAL NAILING;  Surgeon: [name], MD;  Location: [place];  Service: Orthopedics;  Laterality: Right;  . External fixation leg Right 02/09/2015    Procedure: EXTERNAL FIXATION LEG;  Surgeon: [name], MD;  Location: [place];  Service: Orthopedics;  Laterality: Right;  . Orif ulnar fracture Right 02/09/2015    Procedure:  OPEN REDUCTION INTERNAL FIXATION (ORIF) ULNAR FRACTURE;  Surgeon: [name], MD;  Location: [place];  Service: Orthopedics;  Laterality: Right;  . Orif ankle fracture Right 02/14/2015    Procedure: OPEN REDUCTION INTERNAL FIXATION (ORIF) PILON FRACTURE;  Surgeon: [name], MD;  Location: [place];  Service: Orthopedics;  Laterality: Right;  . External fixation removal Right 02/14/2015    Procedure: REMOVAL EXTERNAL FIXATION LEG;  Surgeon: [name], MD;  Location: [place];  Service: Orthopedics;  Laterality: Right;  . Back surgery     Past Medical History  Diagnosis Date  . Diabetes mellitus   . Diabetes mellitus ([place])   . Hypertension   . Smoker 0    denies smoking  . History of blood clots   . Rib fracture   . Pneumothorax    BP 159/77 mmHg  Pulse 67  SpO2 99%  Opioid Risk Score:   Fall Risk Score:  `1  Depression screen PHQ 2/9  Depression screen [facility] 2/9 12/11/2015 11/13/2015  Decreased Interest 3 3  Down, Depressed, Hopeless 3 3  PHQ - 2 Score 6 6  Altered sleeping - 3  Tired, decreased energy - 3  Change in appetite - 3  Feeling bad or failure about yourself  - 3  Trouble concentrating - 3  Moving slowly or fidgety/restless - 3  Suicidal thoughts - 0  PHQ-9 Score - 24     Review of Systems  All other systems reviewed and are negative.      Objective:   Physical Exam  Constitutional: He appears well-developed and well-nourished.  HENT:  Head: Normocephalic and atraumatic.  Eyes: Conjunctivae and EOM are normal. Pupils are equal, round, and reactive to light.  Neck: Normal range of motion.  Musculoskeletal:       Right knee: He exhibits no effusion, no deformity, normal alignment, no LCL laxity, normal patellar mobility and no MCL laxity. Tenderness found. Patellar tendon tenderness noted.  Psychiatric: He has a normal mood and affect.  Nursing note and vitals reviewed.  Patient without evidence of knee swelling. There is no evidence of crepitus  with flexion and extension he has full range of motion of the right knee. Right calf is soft. No evidence of erythema. Ambulates with a straight cane. No evidence of toe drag or knee instability.       Assessment & Plan:  1. Multi trauma with multiple fractures after tree fell on patient.History of right ulnar fracture, right femoral fracture, rib fractures Has chronic posttraumatic pain We'll continue tramadol 50 mg but increase to 4 times per day Return to clinic in 6 months  2. 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

## 2016-05-26 ENCOUNTER — Emergency Department (HOSPITAL_COMMUNITY): Payer: Self-pay

## 2016-05-26 ENCOUNTER — Encounter (HOSPITAL_COMMUNITY): Payer: Self-pay | Admitting: *Deleted

## 2016-05-26 ENCOUNTER — Inpatient Hospital Stay (HOSPITAL_COMMUNITY)
Admission: EM | Admit: 2016-05-26 | Discharge: 2016-05-28 | DRG: 378 | Disposition: A | Discharge status: Home / Self Care | Payer: Self-pay | Attending: Internal Medicine | Admitting: Internal Medicine

## 2016-05-26 DIAGNOSIS — Z833 Family history of diabetes mellitus: Secondary | ICD-10-CM

## 2016-05-26 DIAGNOSIS — Z87891 Personal history of nicotine dependence: Secondary | ICD-10-CM

## 2016-05-26 DIAGNOSIS — Z794 Long term (current) use of insulin: Secondary | ICD-10-CM

## 2016-05-26 DIAGNOSIS — I10 Essential (primary) hypertension: Secondary | ICD-10-CM | POA: Diagnosis present

## 2016-05-26 DIAGNOSIS — K922 Gastrointestinal hemorrhage, unspecified: Secondary | ICD-10-CM | POA: Diagnosis present

## 2016-05-26 DIAGNOSIS — R001 Bradycardia, unspecified: Secondary | ICD-10-CM | POA: Diagnosis present

## 2016-05-26 DIAGNOSIS — K226 Gastro-esophageal laceration-hemorrhage syndrome: Secondary | ICD-10-CM | POA: Diagnosis present

## 2016-05-26 DIAGNOSIS — IMO0001 Reserved for inherently not codable concepts without codable children: Secondary | ICD-10-CM

## 2016-05-26 DIAGNOSIS — Z825 Family history of asthma and other chronic lower respiratory diseases: Secondary | ICD-10-CM

## 2016-05-26 DIAGNOSIS — R7989 Other specified abnormal findings of blood chemistry: Secondary | ICD-10-CM | POA: Diagnosis present

## 2016-05-26 DIAGNOSIS — K920 Hematemesis: Principal | ICD-10-CM | POA: Diagnosis present

## 2016-05-26 DIAGNOSIS — Z79899 Other long term (current) drug therapy: Secondary | ICD-10-CM

## 2016-05-26 DIAGNOSIS — G8921 Chronic pain due to trauma: Secondary | ICD-10-CM | POA: Diagnosis present

## 2016-05-26 DIAGNOSIS — N179 Acute kidney failure, unspecified: Secondary | ICD-10-CM | POA: Diagnosis present

## 2016-05-26 DIAGNOSIS — E119 Type 2 diabetes mellitus without complications: Secondary | ICD-10-CM | POA: Diagnosis present

## 2016-05-26 DIAGNOSIS — D62 Acute posthemorrhagic anemia: Secondary | ICD-10-CM | POA: Diagnosis present

## 2016-05-26 DIAGNOSIS — Z9889 Other specified postprocedural states: Secondary | ICD-10-CM

## 2016-05-26 HISTORY — DX: Gastrointestinal hemorrhage, unspecified: K92.2

## 2016-05-26 HISTORY — DX: Acute respiratory failure, unspecified whether with hypoxia or hypercapnia: J96.00

## 2016-05-26 HISTORY — DX: Other cause of strike by thrown, projected or falling object, initial encounter: W20.8XXA

## 2016-05-26 HISTORY — DX: Acute posthemorrhagic anemia: D62

## 2016-05-26 HISTORY — DX: Traumatic hemopneumothorax, initial encounter: S27.2XXA

## 2016-05-26 HISTORY — DX: Multiple fractures of ribs, right side, initial encounter for closed fracture: S22.41XA

## 2016-05-26 HISTORY — DX: Thrombocytopenia, unspecified: D69.6

## 2016-05-26 HISTORY — DX: Postlaminectomy syndrome, not elsewhere classified: M96.1

## 2016-05-26 HISTORY — DX: Chronic pain due to trauma: G89.21

## 2016-05-26 LAB — URINALYSIS, ROUTINE W REFLEX MICROSCOPIC
BILIRUBIN URINE: NEGATIVE
Glucose, UA: 100 mg/dL — AB
HGB URINE DIPSTICK: NEGATIVE
Ketones, ur: NEGATIVE mg/dL
Leukocytes, UA: NEGATIVE
Nitrite: NEGATIVE
PH: 5.5 (ref 5.0–8.0)
Protein, ur: NEGATIVE mg/dL
SPECIFIC GRAVITY, URINE: 1.02 (ref 1.005–1.030)

## 2016-05-26 LAB — PROTIME-INR
INR: 0.92
Prothrombin Time: 12.3 seconds (ref 11.4–15.2)

## 2016-05-26 LAB — CBC WITH DIFFERENTIAL/PLATELET
BASOS ABS: 0 10*3/uL (ref 0.0–0.1)
Basophils Relative: 1 %
EOS PCT: 1 %
Eosinophils Absolute: 0.1 10*3/uL (ref 0.0–0.7)
HCT: 36.7 % — ABNORMAL LOW (ref 39.0–52.0)
Hemoglobin: 12.1 g/dL — ABNORMAL LOW (ref 13.0–17.0)
LYMPHS ABS: 1.3 10*3/uL (ref 0.7–4.0)
LYMPHS PCT: 16 %
MCH: 28.5 pg (ref 26.0–34.0)
MCHC: 33 g/dL (ref 30.0–36.0)
MCV: 86.6 fL (ref 78.0–100.0)
MONO ABS: 0.7 10*3/uL (ref 0.1–1.0)
Monocytes Relative: 9 %
Neutro Abs: 6.2 10*3/uL (ref 1.7–7.7)
Neutrophils Relative %: 73 %
PLATELETS: 194 10*3/uL (ref 150–400)
RBC: 4.24 MIL/uL (ref 4.22–5.81)
RDW: 13.4 % (ref 11.5–15.5)
WBC: 8.4 10*3/uL (ref 4.0–10.5)

## 2016-05-26 LAB — COMPREHENSIVE METABOLIC PANEL
ALT: 35 U/L (ref 17–63)
AST: 67 U/L — AB (ref 15–41)
Albumin: 4.1 g/dL (ref 3.5–5.0)
Alkaline Phosphatase: 102 U/L (ref 38–126)
Anion gap: 5 (ref 5–15)
BUN: 25 mg/dL — AB (ref 6–20)
CHLORIDE: 108 mmol/L (ref 101–111)
CO2: 26 mmol/L (ref 22–32)
CREATININE: 1.35 mg/dL — AB (ref 0.61–1.24)
Calcium: 8.8 mg/dL — ABNORMAL LOW (ref 8.9–10.3)
GFR calc Af Amer: >60 mL/min (ref >60)
GFR, EST NON AFRICAN AMERICAN: 59 mL/min — AB (ref >60)
Glucose, Bld: 191 mg/dL — ABNORMAL HIGH (ref 65–99)
Potassium: 4 mmol/L (ref 3.5–5.1)
Sodium: 139 mmol/L (ref 135–145)
Total Bilirubin: 0.7 mg/dL (ref 0.3–1.2)
Total Protein: 7.6 g/dL (ref 6.5–8.1)

## 2016-05-26 LAB — TROPONIN I: Troponin I: <0.03 ng/mL (ref <0.03)

## 2016-05-26 LAB — LIPASE, BLOOD: LIPASE: 32 U/L (ref 11–51)

## 2016-05-26 LAB — HEMOGLOBIN AND HEMATOCRIT, BLOOD
HEMATOCRIT: 33.5 % — AB (ref 39.0–52.0)
HEMOGLOBIN: 11.3 g/dL — AB (ref 13.0–17.0)

## 2016-05-26 LAB — SAMPLE TO BLOOD BANK

## 2016-05-26 LAB — POC OCCULT BLOOD, ED: Fecal Occult Bld: NEGATIVE

## 2016-05-26 MED ORDER — ONDANSETRON HCL 4 MG/2ML IJ SOLN
4.0000 mg | INTRAMUSCULAR | Status: DC | PRN
  Administered 2016-05-26: 4 mg via INTRAVENOUS
  Filled 2016-05-26 (×2): qty 2

## 2016-05-26 MED ORDER — SODIUM CHLORIDE 0.9 % IV SOLN
8.0000 mg/h | INTRAVENOUS | Status: DC
  Administered 2016-05-27 (×2): 8 mg/h via INTRAVENOUS
  Filled 2016-05-26 (×4): qty 80

## 2016-05-26 MED ORDER — IOPAMIDOL (ISOVUE-300) INJECTION 61%
INTRAVENOUS | Status: AC
  Administered 2016-05-26: 30 mL via ORAL
  Filled 2016-05-26: qty 30

## 2016-05-26 MED ORDER — SODIUM CHLORIDE 0.9 % IV SOLN
80.0000 mg | Freq: Once | INTRAVENOUS | Status: AC
  Administered 2016-05-27: 80 mg via INTRAVENOUS
  Filled 2016-05-26: qty 80

## 2016-05-26 MED ORDER — IOPAMIDOL (ISOVUE-370) INJECTION 76%
100.0000 mL | Freq: Once | INTRAVENOUS | Status: AC | PRN
  Administered 2016-05-26: 100 mL via INTRAVENOUS

## 2016-05-26 MED ORDER — MORPHINE SULFATE (PF) 4 MG/ML IV SOLN
4.0000 mg | INTRAVENOUS | Status: AC | PRN
  Administered 2016-05-26 (×2): 4 mg via INTRAVENOUS
  Filled 2016-05-26 (×2): qty 1

## 2016-05-26 NOTE — ED Triage Notes (Signed)
Pt comes in with RUQ starting suddenly at 4:50 pm today. He became diaphoretic and had and episode of vomiting that was bright red in color. Pt restless in triage.

## 2016-05-26 NOTE — H&P (Signed)
History and Physical    Jeremy Burgess [id number] DOB: 24-Jun-1966 DOA: 05/26/2016  Referring MD/NP/PA: Francine Graven, MD PCP: Raiford Simmonds., PA-C  Outpatient Specialists: None Patient coming from: Home  Chief Complaint: Emesis with blood  HPI: Jeremy Burgess is a 49 y.o. male with medical history significant of DM type 2 and HTN presents to the ED with complaints of blood in his vomit. He was feeling fine earlier today. He admits to eating some grilled chicken earlier today from [facility]. He reports that later this evening he had one episode of emesis that consisted of food and about a teaspoon of blood. He admits to having a similar episode of blood in his vomit a couple of years ago in which he was not treated for. He denies any current pain. He also denies any history of liver problems from drinking.  In the ER, he has stable hemodynamics, and his Hb was about the same as baseline.  Though 8 months ago, it was 14 g per dL.  He had no black stool, and OB was negative per EDP. Work up in the ER included a BUN 25, Cr. 1.35, calcium 8.8, glucose 191, AST 67, Hgb 11.3. UA was unremarkable.  CT a/p and CT angio chest are unremarkable. CXR is negative. EKG shows sinus rhythm.  GI was consulted, and hospitalist was asked to admit him for UGIB, ? MW Tear, with stable hemodynamics.  ED Course: See above.  Review of Systems: As per HPI otherwise 10 point review of systems negative.    Past Medical History:  Diagnosis Date  . Chronic pain due to trauma   . Diabetes mellitus   . Diabetes mellitus ([place])   . History of blood clots   . Hypertension   . Pneumothorax   . Rib fracture   . Smoker 0   denies smoking    Past Surgical History:  Procedure Laterality Date  . BACK SURGERY    . EXTERNAL FIXATION LEG Right 02/09/2015   Procedure: EXTERNAL FIXATION LEG;  Surgeon: [name], MD;  Location: [place];  Service: Orthopedics;  Laterality: Right;  . EXTERNAL FIXATION REMOVAL Right  02/14/2015   Procedure: REMOVAL EXTERNAL FIXATION LEG;  Surgeon: [name], MD;  Location: [place];  Service: Orthopedics;  Laterality: Right;  . FEMUR IM NAIL Right 02/09/2015   Procedure: INTRAMEDULLARY (IM) RETROGRADE FEMORAL NAILING;  Surgeon: [name], MD;  Location: [place];  Service: Orthopedics;  Laterality: Right;  . NOSE SURGERY    . ORIF ANKLE FRACTURE Right 02/14/2015   Procedure: OPEN REDUCTION INTERNAL FIXATION (ORIF) PILON FRACTURE;  Surgeon: [name], MD;  Location: [place];  Service: Orthopedics;  Laterality: Right;  . ORIF ULNAR FRACTURE Right 02/09/2015   Procedure: OPEN REDUCTION INTERNAL FIXATION (ORIF) ULNAR FRACTURE;  Surgeon: [name], MD;  Location: [place];  Service: Orthopedics;  Laterality: Right;     reports that he has never smoked. He has never used smokeless tobacco. He reports that he does not drink alcohol or use drugs.  Allergies  Allergen Reactions  . Bee Venom   . Bee Venom Other (See Comments)    intolerance    Family History  Problem Relation Age of Onset  . Diabetes Mother   . COPD Mother      Prior to Admission medications   Medication Sig Start Date End Date Taking? Authorizing Provider  insulin detemir (LEVEMIR) 100 UNIT/ML injection Inject 0.1 mLs (10 Units total) into  the skin 2 (two) times daily. Patient taking differently: Inject 60 Units into the skin at bedtime.  03/16/15  Yes Daniel J Angiulli, PA-C  lisinopril (PRINIVIL,ZESTRIL) 20 MG tablet Take 20 mg by mouth daily. Takes with the 20/25 combo   Yes Historical Provider, MD    Physical Exam: Vitals:   05/26/16 2130 05/26/16 2145 05/26/16 2230 05/26/16 2245  BP: 137/69 138/72 128/73 125/70  Pulse: (!) 57 (!) 58 (!) 55 (!) 54  Resp: 12 13 13 13   Temp:      TempSrc:      SpO2: 98% 98% 98% 98%  Weight:      Height:          Constitutional: NAD, calm, comfortable Vitals:   05/26/16 2130 05/26/16 2145 05/26/16 2230 05/26/16 2245  BP: 137/69 138/72 128/73  125/70  Pulse: (!) 57 (!) 58 (!) 55 (!) 54  Resp: 12 13 13 13   Temp:      TempSrc:      SpO2: 98% 98% 98% 98%  Weight:      Height:       Eyes: PERRL, lids and conjunctivae normal ENMT: Mucous membranes are moist. Posterior pharynx clear of any exudate or lesions.Normal dentition.  Neck: normal, supple, no masses, no thyromegaly Respiratory: clear to auscultation bilaterally, no wheezing, no crackles. Normal respiratory effort. No accessory muscle use.  Cardiovascular: Regular rate and rhythm, no murmurs / rubs / gallops. No extremity edema. 2+ pedal pulses. No carotid bruits.  Abdomen: no tenderness, no masses palpated. No hepatosplenomegaly. Bowel sounds positive.  Musculoskeletal: no clubbing / cyanosis. No joint deformity upper and lower extremities. Good ROM, no contractures. Normal muscle tone.  Skin: no rashes, lesions, ulcers. No induration Neurologic: CN 2-12 grossly intact. Sensation intact, DTR normal. Strength 5/5 in all 4.  Psychiatric: Normal judgment and insight. Alert and oriented x 3. Normal mood.    Labs on Admission: I have personally reviewed following labs and imaging studies  CBC:  Recent Labs Lab 05/26/16 1757 05/26/16 2046  WBC 8.4  --   NEUTROABS 6.2  --   HGB 12.1* 11.3*  HCT 36.7* 33.5*  MCV 86.6  --   PLT 194  --    Basic Metabolic Panel:  Recent Labs Lab 05/26/16 1757  NA 139  K 4.0  CL 108  CO2 26  GLUCOSE 191*  BUN 25*  CREATININE 1.35*  CALCIUM 8.8*   GFR: Estimated Creatinine Clearance: 79 mL/min (by C-G formula based on SCr of 1.35 mg/dL (H)). Liver Function Tests:  Recent Labs Lab 05/26/16 1757  AST 67*  ALT 35  ALKPHOS 102  BILITOT 0.7  PROT 7.6  ALBUMIN 4.1    Recent Labs Lab 05/26/16 1757  LIPASE 32   Coagulation Profile:  Recent Labs Lab 05/26/16 1757  INR 0.92   Cardiac Enzymes:  Recent Labs Lab 05/26/16 1757  TROPONINI <0.03   Urine analysis:    Component Value Date/Time   COLORURINE YELLOW  05/26/2016 [place] 05/26/2016 1727   LABSPEC 1.020 05/26/2016 1727   PHURINE 5.5 05/26/2016 1727   GLUCOSEU 100 (A) 05/26/2016 1727   HGBUR NEGATIVE 05/26/2016 1727   BILIRUBINUR NEGATIVE 05/26/2016 1727   KETONESUR NEGATIVE 05/26/2016 1727   PROTEINUR NEGATIVE 05/26/2016 1727   UROBILINOGEN 1.0 02/12/2015 1206   NITRITE NEGATIVE 05/26/2016 1727   LEUKOCYTESUR NEGATIVE 05/26/2016 1727   Radiological Exams on Admission: Dg Chest 2 View  Result Date: 05/26/2016 CLINICAL DATA:  Chest pain  and abdominal pain EXAM: CHEST  2 VIEW COMPARISON:  10/25/2015 FINDINGS: Cardiac enlargement without heart failure. Lungs are clear without infiltrate or effusion. Chronic displaced right rib fractures with mild pleural thickening unchanged from the prior study. Mild chronic compression fracture T12 unchanged. No pleural effusion. IMPRESSION: No active cardiopulmonary disease. Electronically Signed   By: Franchot Gallo M.D.   On: 05/26/2016 19:41   Ct Angio Chest Pe W/cm &/or Wo Cm  Result Date: 05/26/2016 CLINICAL DATA:  Initial evaluation for acute right chest and abdominal pain. EXAM: CT ANGIOGRAPHY CHEST WITH CONTRAST TECHNIQUE: Multidetector CT imaging of the chest was performed using the standard protocol during bolus administration of intravenous contrast. Multiplanar CT image reconstructions and MIPs were obtained to evaluate the vascular anatomy. COMPARISON:  None. FINDINGS: Cardiovascular: Intrathoracic aorta of normal caliber and appearance without acute abnormality. Visualized great vessels intact and normal. Heart size normal. No significant pericardial fusion. Pulmonary arteries are adequately opacified for evaluation. Main pulmonary artery within normal limits for size. No filling defect to suggest acute pulmonary embolism. Re-formatted imaging confirms these findings. Mediastinum/Nodes: Visualized thyroid is normal. No enlarged mediastinal, hilar, or axillary lymph nodes  identified. Esophagus is normal. Lungs/Pleura: Irregular remote right-sided rib fractures associated atelectasis/ scar. Minimal atelectasis within the peripheral left lung base. Lungs are otherwise clear. No focal infiltrates. No pulmonary edema or pleural effusion. No pneumothorax. No worrisome pulmonary nodule or mass. Upper Abdomen: Visualized upper abdomen unremarkable. Musculoskeletal: Multiple remotely healed right-sided rib fractures noted. Irregularity present at the partially visualized distal right clavicle (series 5, image 1). Left clavicle within normal limits. No other acute osseous abnormality. No worrisome lytic or blastic osseous lesions. Review of the MIP images confirms the above findings. IMPRESSION: 1. No CT evidence for acute pulmonary embolism. 2. No other acute cardiopulmonary abnormality identified. 3. Somewhat age indeterminate deformity of the partially visualized distal right clavicle. While this may be chronic in nature, possible acute or subacute abnormality is not entirely excluded. Further evaluation with dedicated x-ray the right clavicle is suggested if there is clinical concern for possible acute or subacute trauma to this region. 4. Multiple remotely healed right-sided rib fractures with irregularity. Associated mild atelectasis/scarring within the adjacent right lung. Electronically Signed   By: Jeannine Boga M.D.   On: 05/26/2016 19:47   Ct Abdomen Pelvis W Contrast  Result Date: 05/26/2016 CLINICAL DATA:  Initial evaluation for acute right-sided chest and abdominal pain. EXAM: CT ABDOMEN AND PELVIS WITH CONTRAST TECHNIQUE: Multidetector CT imaging of the abdomen and pelvis was performed using the standard protocol following bolus administration of intravenous contrast. CONTRAST:  100 cc of Isovue 370. COMPARISON:  None available. FINDINGS: Lower chest: Mild atelectasis/ scar present within the bilateral lung bases. Visualized lungs are otherwise clear. No pleural or  pericardial effusion. Hepatobiliary: Liver within normal limits. Gallbladder within normal limits. No biliary dilatation. Pancreas: Pancreas within normal limits. Spleen: Spleen within normal limits. Adrenals/Urinary Tract: Adrenal glands are normal. Kidneys equal in size with symmetric enhancement. No nephrolithiasis, hydronephrosis, or focal enhancing renal mass. Ureters are normal caliber without acute abnormality. Bladder within normal limits. Stomach/Bowel: Stomach within normal limits. No evidence for bowel obstruction. Appendix normal. No acute inflammatory changes about the bowels. Vascular/Lymphatic: Normal intravascular enhancement seen within the intra-abdominal aorta and its branch vessels. No adenopathy. Reproductive: Prostate normal. Other: No free air or fluid. Musculoskeletal: Multiple remotely healed right-sided rib fractures noted. Wedging deformity of the T12 vertebral body appears chronic in nature. No worrisome lytic or blastic  osseous lesions. Fixation rod partially visualize within the right femur. Chronic bilateral pars defects at L5 with associated 6 mm spondylolisthesis. IMPRESSION: 1. No CT evidence for acute intra-abdominal pelvic process. 2. Multiple remotely healed right-sided rib fractures. 3. Chronic wedging deformity of the T12 vertebral body. No acute osseous abnormality identified. 4. Chronic 6 mm spondylolisthesis of L5 on S1. Electronically Signed   By: Jeannine Boga M.D.   On: 05/26/2016 19:56    EKG: Independently reviewed. Sinus rhythm  Assessment/Plan Principal Problem:   Upper GI bleed Active Problems:   Insulin dependent diabetes mellitus (H[facility])   HTN (hypertension)   Chronic pain due to trauma   1. Upper GI bleed. Hgb is slightly low at 12.1. Patient admits that he had one episode of blood in his vomit along with some abd pain. He does not currently have any abd pain. FOBT was negative. CT a/p and CT angio chest were unremarkable. CXR was unremarkable. I  think he may have had a MW Tear, but unclear, so will admit him to the SDU for upper GI bleed. Follow H and H every 4 hours, and transfuse as required. R and B explained and he agreed.  Will give PPI Bolus and Drip for now.  Have consulted GI and will defer EGD to GI. Will give clear liquid.  2. DM type 2. Stable at 191. Start on SSI. 3. HTN. Stable. Will hold anti HTN meds in the setting of UGIB.  4. Chronic pain due to trauma. Noted.   DVT prophylaxis: SCD's Code Status: Full Family Communication: Family at bedside  Disposition Plan: Discharge once improved Consults called: Gastroenterology Admission status: Admit to inpatient   Orvan Falconer, MD    FACP [facility] If 7PM-7AM, please contact night-coverage [email or web address] Password [email or web address]  05/26/2016, 11:03 PM   By signing my name below, I, Hilbert Odor, attest that this documentation has been prepared under the direction and in the presence of Orvan Falconer, MD. Electronically signed: Hilbert Odor, Scribe.  05/26/16, 11:20 PM

## 2016-05-26 NOTE — ED Provider Notes (Signed)
[facility] DEPT Provider Note   CSN: [id number] Arrival date & time: 05/26/16  1652     History   Chief Complaint Chief Complaint  Patient presents with  . Abdominal Pain    HPI Jeremy Burgess is a 50 y.o. male.  HPI  Pt was seen at 1720. Per pt, c/o sudden onset and persistence of constant RUQ and right ribs "pain" that began PTA. Was associated with one episode of N/V. States the emesis was "all bright red" in color. Endorses hx of chronic pain in the same areas, but that today's pain "is different." Denies new injury, no SOB, no back pain, no diarrhea, no black or blood in stools, no fevers.   Past Medical History:  Diagnosis Date  . Chronic pain due to trauma   . Diabetes mellitus   . Diabetes mellitus ([place])   . History of blood clots   . Hypertension   . Pneumothorax   . Rib fracture   . Smoker 0   denies smoking    Patient Active Problem List   Diagnosis Date Noted  . Chronic pain due to trauma 12/11/2015  . Postlaminectomy syndrome, lumbar region 11/13/2015  . Anterior knee pain   . Pain in limb   . Closed right pilon fracture 02/23/2015  . Ankle fracture, right 02/09/2015  . Acute blood loss anemia 02/09/2015  . Thrombocytopenia ([place]) 02/09/2015  . Acute renal failure ([place]) 02/09/2015  . Acute respiratory failure ([place]) 02/09/2015  . Traumatic hemopneumothorax 02/09/2015  . HTN (hypertension) 02/09/2015  . Accidentally struck by falling tree 02/08/2015  . Insulin dependent diabetes mellitus ([place]) 02/08/2015  . Traumatic closed displaced fracture of multiple ribs of right side 02/08/2015  . Closed fracture of right ulna 02/08/2015  . Femur fracture, right ([place]) 02/08/2015    Past Surgical History:  Procedure Laterality Date  . BACK SURGERY    . EXTERNAL FIXATION LEG Right 02/09/2015   Procedure: EXTERNAL FIXATION LEG;  Surgeon: [name], MD;  Location: [facility];  Service: Orthopedics;  Laterality: Right;  . EXTERNAL FIXATION REMOVAL Right  02/14/2015   Procedure: REMOVAL EXTERNAL FIXATION LEG;  Surgeon: [name], MD;  Location: [facility];  Service: Orthopedics;  Laterality: Right;  . FEMUR IM NAIL Right 02/09/2015   Procedure: INTRAMEDULLARY (IM) RETROGRADE FEMORAL NAILING;  Surgeon: [name], MD;  Location: [facility];  Service: Orthopedics;  Laterality: Right;  . NOSE SURGERY    . ORIF ANKLE FRACTURE Right 02/14/2015   Procedure: OPEN REDUCTION INTERNAL FIXATION (ORIF) PILON FRACTURE;  Surgeon: [name], MD;  Location: [place];  Service: Orthopedics;  Laterality: Right;  . ORIF ULNAR FRACTURE Right 02/09/2015   Procedure: OPEN REDUCTION INTERNAL FIXATION (ORIF) ULNAR FRACTURE;  Surgeon: [name], MD;  Location: [facility];  Service: Orthopedics;  Laterality: Right;       Home Medications    Prior to Admission medications   Medication Sig Start Date End Date Taking? Authorizing Provider  insulin detemir (LEVEMIR) 100 UNIT/ML injection Inject 0.1 mLs (10 Units total) into the skin 2 (two) times daily. Patient taking differently: Inject 60 Units into the skin at bedtime.  03/16/15  Yes Daniel J Angiulli, PA-C  lisinopril (PRINIVIL,ZESTRIL) 20 MG tablet Take 20 mg by mouth daily. Takes with the 20/25 combo   Yes Historical Provider, MD    Family History Family History  Problem Relation Age of Onset  . Diabetes Mother   . COPD Mother  Social History Social History  Substance Use Topics  . Smoking status: Never Smoker  . Smokeless tobacco: Never Used  . Alcohol use No     Allergies   Bee venom and Bee venom   Review of Systems Review of Systems ROS: Statement: All systems negative except as marked or noted in the HPI; Constitutional: Negative for fever and chills. ; ; Eyes: Negative for eye pain, redness and discharge. ; ; ENMT: Negative for ear pain, hoarseness, nasal congestion, sinus pressure and sore throat. ; ; Cardiovascular: Negative for palpitations, diaphoresis, dyspnea and peripheral edema.  ; ; Respiratory: Negative for cough, wheezing and stridor. ; ; Gastrointestinal: +N/V, blood in emesis, abd pain. Negative for diarrhea, blood in stool, jaundice and rectal bleeding. . ; ; Genitourinary: Negative for dysuria, flank pain and hematuria. ; ; Musculoskeletal: +right chest pain. Negative for back pain and neck pain. Negative for swelling and trauma.; ; Skin: Negative for pruritus, rash, abrasions, blisters, bruising and skin lesion.; ; Neuro: Negative for headache, lightheadedness and neck stiffness. Negative for weakness, altered level of consciousness, altered mental status, extremity weakness, paresthesias, involuntary movement, seizure and syncope.       Physical Exam Updated Vital Signs BP 157/85 (BP Location: Left Arm)   Pulse 70   Temp 98.7 F (37.1 C) (Oral)   Resp 16   Ht 5\' 11"  (1.803 m)   Wt 232 lb (105.2 kg)   SpO2 100%   BMI 32.36 kg/m   Physical Exam 1725: Physical examination:  Nursing notes reviewed; Vital signs and O2 SAT reviewed;  Constitutional: Well developed, Well nourished, Well hydrated, In no acute distress; Head:  Normocephalic, atraumatic; Eyes: EOMI, PERRL, No scleral icterus; ENMT: Mouth and pharynx normal, Mucous membranes moist; Neck: Supple, Full range of motion, No lymphadenopathy; Cardiovascular: Regular rate and rhythm, No gallop; Respiratory: Breath sounds clear & equal bilaterally, No wheezes.  Speaking full sentences with ease, Normal respiratory effort/excursion; Chest: +right lower lateral and anterior chest wall tender to palp. No deformity, no soft tissue crepitus. Movement normal; Abdomen: Soft, +RUQ tenderness to palp. No rebound or guarding. Nondistended, Normal bowel sounds; Genitourinary: No CVA tenderness; Extremities: Pulses normal, No tenderness, No edema, No calf edema or asymmetry.; Neuro: AA&Ox3, Major CN grossly intact.  Speech clear. No gross focal motor or sensory deficits in extremities.; Skin: Color normal, Warm, Dry.   ED  Treatments / Results  Labs (all labs ordered are listed, but only abnormal results are displayed)   EKG  EKG Interpretation  Date/Time:  Sunday May 26 2016 17:18:51 EDT Ventricular Rate:  66 PR Interval:    QRS Duration: 87 QT Interval:  375 QTC Calculation: 393 R Axis:   65 Text Interpretation:  Sinus rhythm When compared with ECG of 10/25/2015 No significant change was found Confirmed by [facility]  MD, Nunzio Cory [phone]) on 05/26/2016 5:28:52 PM       Radiology   Procedures Procedures (including critical care time)  Medications Ordered in ED Medications  ondansetron (ZOFRAN) injection 4 mg (4 mg Intravenous Given 05/26/16 1806)  morphine 4 MG/ML injection 4 mg (4 mg Intravenous Given 05/26/16 1806)  iopamidol (ISOVUE-300) 61 % injection (not administered)     Initial Impression / Assessment and Plan / ED Course  I have reviewed the triage vital signs and the nursing notes.  Pertinent labs & imaging results that were available during my care of the patient were reviewed by me and considered in my medical decision making (see chart for details).  MDM Reviewed: previous chart, nursing note and vitals Reviewed previous: labs and ECG Interpretation: labs, ECG, x-ray and CT scan   Results for orders placed or performed during the hospital encounter of 05/26/16  Comprehensive metabolic panel  Result Value Ref Range   Sodium 139 135 - 145 mmol/L   Potassium 4.0 3.5 - 5.1 mmol/L   Chloride 108 101 - 111 mmol/L   CO2 26 22 - 32 mmol/L   Glucose, Bld 191 (H) 65 - 99 mg/dL   BUN 25 (H) 6 - 20 mg/dL   Creatinine, Ser 1.35 (H) 0.61 - 1.24 mg/dL   Calcium 8.8 (L) 8.9 - 10.3 mg/dL   Total Protein 7.6 6.5 - 8.1 g/dL   Albumin 4.1 3.5 - 5.0 g/dL   AST 67 (H) 15 - 41 U/L   ALT 35 17 - 63 U/L   Alkaline Phosphatase 102 38 - 126 U/L   Total Bilirubin 0.7 0.3 - 1.2 mg/dL   GFR calc non Af Amer 59 (L) >60 mL/min   GFR calc Af Amer >60 >60 mL/min   Anion gap 5 5 - 15    Lipase, blood  Result Value Ref Range   Lipase 32 11 - 51 U/L  CBC with Differential  Result Value Ref Range   WBC 8.4 4.0 - 10.5 K/uL   RBC 4.24 4.22 - 5.81 MIL/uL   Hemoglobin 12.1 (L) 13.0 - 17.0 g/dL   HCT 36.7 (L) 39.0 - 52.0 %   MCV 86.6 78.0 - 100.0 fL   MCH 28.5 26.0 - 34.0 pg   MCHC 33.0 30.0 - 36.0 g/dL   RDW 13.4 11.5 - 15.5 %   Platelets 194 150 - 400 K/uL   Neutrophils Relative % 73 %   Neutro Abs 6.2 1.7 - 7.7 K/uL   Lymphocytes Relative 16 %   Lymphs Abs 1.3 0.7 - 4.0 K/uL   Monocytes Relative 9 %   Monocytes Absolute 0.7 0.1 - 1.0 K/uL   Eosinophils Relative 1 %   Eosinophils Absolute 0.1 0.0 - 0.7 K/uL   Basophils Relative 1 %   Basophils Absolute 0.0 0.0 - 0.1 K/uL  Protime-INR  Result Value Ref Range   Prothrombin Time 12.3 11.4 - 15.2 seconds   INR 0.92   Urinalysis, Routine w reflex microscopic  Result Value Ref Range   Color, Urine YELLOW YELLOW   APPearance CLEAR CLEAR   Specific Gravity, Urine 1.020 1.005 - 1.030   pH 5.5 5.0 - 8.0   Glucose, UA 100 (A) NEGATIVE mg/dL   Hgb urine dipstick NEGATIVE NEGATIVE   Bilirubin Urine NEGATIVE NEGATIVE   Ketones, ur NEGATIVE NEGATIVE mg/dL   Protein, ur NEGATIVE NEGATIVE mg/dL   Nitrite NEGATIVE NEGATIVE   Leukocytes, UA NEGATIVE NEGATIVE  Troponin I  Result Value Ref Range   Troponin I <0.03 <0.03 ng/mL  Hemoglobin and hematocrit, blood  Result Value Ref Range   Hemoglobin 11.3 (L) 13.0 - 17.0 g/dL   HCT 33.5 (L) 39.0 - 52.0 %  POC occult blood, ED  Result Value Ref Range   Fecal Occult Bld NEGATIVE NEGATIVE  Sample to Blood Bank  Result Value Ref Range   Blood Bank Specimen BBHLD    Sample Expiration 05/27/2016    Dg Chest 2 View Result Date: 05/26/2016 CLINICAL DATA:  Chest pain and abdominal pain EXAM: CHEST  2 VIEW COMPARISON:  10/25/2015 FINDINGS: Cardiac enlargement without heart failure. Lungs are clear without infiltrate or effusion. Chronic displaced right rib  fractures with mild  pleural thickening unchanged from the prior study. Mild chronic compression fracture T12 unchanged. No pleural effusion. IMPRESSION: No active cardiopulmonary disease. Electronically Signed   By: Franchot Gallo M.D.   On: 05/26/2016 19:41   Ct Angio Chest Pe W/cm &/or Wo Cm Result Date: 05/26/2016 CLINICAL DATA:  Initial evaluation for acute right chest and abdominal pain. EXAM: CT ANGIOGRAPHY CHEST WITH CONTRAST TECHNIQUE: Multidetector CT imaging of the chest was performed using the standard protocol during bolus administration of intravenous contrast. Multiplanar CT image reconstructions and MIPs were obtained to evaluate the vascular anatomy. COMPARISON:  None. FINDINGS: Cardiovascular: Intrathoracic aorta of normal caliber and appearance without acute abnormality. Visualized great vessels intact and normal. Heart size normal. No significant pericardial fusion. Pulmonary arteries are adequately opacified for evaluation. Main pulmonary artery within normal limits for size. No filling defect to suggest acute pulmonary embolism. Re-formatted imaging confirms these findings. Mediastinum/Nodes: Visualized thyroid is normal. No enlarged mediastinal, hilar, or axillary lymph nodes identified. Esophagus is normal. Lungs/Pleura: Irregular remote right-sided rib fractures associated atelectasis/ scar. Minimal atelectasis within the peripheral left lung base. Lungs are otherwise clear. No focal infiltrates. No pulmonary edema or pleural effusion. No pneumothorax. No worrisome pulmonary nodule or mass. Upper Abdomen: Visualized upper abdomen unremarkable. Musculoskeletal: Multiple remotely healed right-sided rib fractures noted. Irregularity present at the partially visualized distal right clavicle (series 5, image 1). Left clavicle within normal limits. No other acute osseous abnormality. No worrisome lytic or blastic osseous lesions. Review of the MIP images confirms the above findings. IMPRESSION: 1. No CT evidence  for acute pulmonary embolism. 2. No other acute cardiopulmonary abnormality identified. 3. Somewhat age indeterminate deformity of the partially visualized distal right clavicle. While this may be chronic in nature, possible acute or subacute abnormality is not entirely excluded. Further evaluation with dedicated x-ray the right clavicle is suggested if there is clinical concern for possible acute or subacute trauma to this region. 4. Multiple remotely healed right-sided rib fractures with irregularity. Associated mild atelectasis/scarring within the adjacent right lung. Electronically Signed   By: Jeannine Boga M.D.   On: 05/26/2016 19:47   Ct Abdomen Pelvis W Contrast Result Date: 05/26/2016 CLINICAL DATA:  Initial evaluation for acute right-sided chest and abdominal pain. EXAM: CT ABDOMEN AND PELVIS WITH CONTRAST TECHNIQUE: Multidetector CT imaging of the abdomen and pelvis was performed using the standard protocol following bolus administration of intravenous contrast. CONTRAST:  100 cc of Isovue 370. COMPARISON:  None available. FINDINGS: Lower chest: Mild atelectasis/ scar present within the bilateral lung bases. Visualized lungs are otherwise clear. No pleural or pericardial effusion. Hepatobiliary: Liver within normal limits. Gallbladder within normal limits. No biliary dilatation. Pancreas: Pancreas within normal limits. Spleen: Spleen within normal limits. Adrenals/Urinary Tract: Adrenal glands are normal. Kidneys equal in size with symmetric enhancement. No nephrolithiasis, hydronephrosis, or focal enhancing renal mass. Ureters are normal caliber without acute abnormality. Bladder within normal limits. Stomach/Bowel: Stomach within normal limits. No evidence for bowel obstruction. Appendix normal. No acute inflammatory changes about the bowels. Vascular/Lymphatic: Normal intravascular enhancement seen within the intra-abdominal aorta and its branch vessels. No adenopathy. Reproductive: Prostate  normal. Other: No free air or fluid. Musculoskeletal: Multiple remotely healed right-sided rib fractures noted. Wedging deformity of the T12 vertebral body appears chronic in nature. No worrisome lytic or blastic osseous lesions. Fixation rod partially visualize within the right femur. Chronic bilateral pars defects at L5 with associated 6 mm spondylolisthesis. IMPRESSION: 1. No CT evidence for acute intra-abdominal  pelvic process. 2. Multiple remotely healed right-sided rib fractures. 3. Chronic wedging deformity of the T12 vertebral body. No acute osseous abnormality identified. 4. Chronic 6 mm spondylolisthesis of L5 on S1. Electronically Signed   By: Jeannine Boga M.D.   On: 05/26/2016 19:56    2310:  Repeat H/H slightly lower than initial H/H. BUN/Cr near baseline. No N/V or stooling while in the ED. Concern for possible Mallory Weiss tear. Dx and testing d/w pt and family.  Questions answered.  Verb understanding, agreeable to observation admit.   T/C to GI [facility], case discussed, including:  HPI, pertinent PM/SHx, VS/PE, dx testing, ED course and treatment:  Will likely need EGD, GI to consult tomorrow, observation admit to medicine.  T/C to [facility]. Marin Comment, case discussed, including:  HPI, pertinent PM/SHx, VS/PE, dx testing, ED course and treatment:  Agreeable to admit, requests he will come to the ED for evaluation.     Final Clinical Impressions(s) / ED Diagnoses   Final diagnoses:  None    New Prescriptions New Prescriptions   No medications on file     Francine Graven, DO 05/28/16 2003

## 2016-05-27 ENCOUNTER — Encounter (HOSPITAL_COMMUNITY): Payer: Self-pay | Admitting: Internal Medicine

## 2016-05-27 ENCOUNTER — Encounter (HOSPITAL_COMMUNITY)
Admission: EM | Disposition: A | Discharge status: Home / Self Care | Payer: Self-pay | Source: Home / Self Care | Attending: Internal Medicine

## 2016-05-27 DIAGNOSIS — I10 Essential (primary) hypertension: Secondary | ICD-10-CM

## 2016-05-27 DIAGNOSIS — Z794 Long term (current) use of insulin: Secondary | ICD-10-CM

## 2016-05-27 DIAGNOSIS — N179 Acute kidney failure, unspecified: Secondary | ICD-10-CM

## 2016-05-27 DIAGNOSIS — K920 Hematemesis: Principal | ICD-10-CM

## 2016-05-27 DIAGNOSIS — K922 Gastrointestinal hemorrhage, unspecified: Secondary | ICD-10-CM

## 2016-05-27 DIAGNOSIS — E119 Type 2 diabetes mellitus without complications: Secondary | ICD-10-CM

## 2016-05-27 DIAGNOSIS — D62 Acute posthemorrhagic anemia: Secondary | ICD-10-CM

## 2016-05-27 HISTORY — PX: ESOPHAGOGASTRODUODENOSCOPY: SHX5428

## 2016-05-27 HISTORY — DX: Gastrointestinal hemorrhage, unspecified: K92.2

## 2016-05-27 LAB — COMPREHENSIVE METABOLIC PANEL
ALBUMIN: 3.7 g/dL (ref 3.5–5.0)
ALK PHOS: 96 U/L (ref 38–126)
ALT: 34 U/L (ref 17–63)
ANION GAP: 4 — AB (ref 5–15)
AST: 40 U/L (ref 15–41)
BUN: 23 mg/dL — ABNORMAL HIGH (ref 6–20)
CALCIUM: 8.7 mg/dL — AB (ref 8.9–10.3)
CHLORIDE: 106 mmol/L (ref 101–111)
CO2: 27 mmol/L (ref 22–32)
Creatinine, Ser: 1.23 mg/dL (ref 0.61–1.24)
GFR calc Af Amer: >60 mL/min (ref >60)
GFR calc non Af Amer: >60 mL/min (ref >60)
GLUCOSE: 121 mg/dL — AB (ref 65–99)
Potassium: 4.5 mmol/L (ref 3.5–5.1)
SODIUM: 137 mmol/L (ref 135–145)
Total Bilirubin: 0.7 mg/dL (ref 0.3–1.2)
Total Protein: 6.8 g/dL (ref 6.5–8.1)

## 2016-05-27 LAB — CBC
HEMATOCRIT: 36.4 % — AB (ref 39.0–52.0)
HEMATOCRIT: 34.3 % — AB (ref 39.0–52.0)
HEMOGLOBIN: 12 g/dL — AB (ref 13.0–17.0)
HEMOGLOBIN: 11.3 g/dL — AB (ref 13.0–17.0)
MCH: 28.5 pg (ref 26.0–34.0)
MCH: 28.4 pg (ref 26.0–34.0)
MCHC: 33 g/dL (ref 30.0–36.0)
MCHC: 32.9 g/dL (ref 30.0–36.0)
MCV: 86.5 fL (ref 78.0–100.0)
MCV: 86.2 fL (ref 78.0–100.0)
Platelets: 194 10*3/uL (ref 150–400)
Platelets: 188 10*3/uL (ref 150–400)
RBC: 4.21 MIL/uL — AB (ref 4.22–5.81)
RBC: 3.98 MIL/uL — ABNORMAL LOW (ref 4.22–5.81)
RDW: 13.4 % (ref 11.5–15.5)
RDW: 13.4 % (ref 11.5–15.5)
WBC: 7.9 10*3/uL (ref 4.0–10.5)
WBC: 7.8 10*3/uL (ref 4.0–10.5)

## 2016-05-27 LAB — TYPE AND SCREEN
ABO/RH(D): A POS
ANTIBODY SCREEN: NEGATIVE

## 2016-05-27 LAB — MRSA PCR SCREENING: MRSA BY PCR: NEGATIVE

## 2016-05-27 LAB — GLUCOSE, CAPILLARY
GLUCOSE-CAPILLARY: 104 mg/dL — AB (ref 65–99)
Glucose-Capillary: 95 mg/dL (ref 65–99)
Glucose-Capillary: 95 mg/dL (ref 65–99)

## 2016-05-27 LAB — TSH: TSH: 5.157 u[IU]/mL — ABNORMAL HIGH (ref 0.350–4.500)

## 2016-05-27 SURGERY — EGD (ESOPHAGOGASTRODUODENOSCOPY)
Anesthesia: Moderate Sedation

## 2016-05-27 MED ORDER — PROMETHAZINE HCL 25 MG/ML IJ SOLN: 25.0000 mg | INTRAMUSCULAR | Status: AC

## 2016-05-27 MED ORDER — SODIUM CHLORIDE 0.9% FLUSH
3.0000 mL | Freq: Two times a day (BID) | INTRAVENOUS | Status: DC
  Administered 2016-05-27 (×2): 3 mL via INTRAVENOUS

## 2016-05-27 MED ORDER — ONDANSETRON HCL 4 MG/2ML IJ SOLN
INTRAMUSCULAR | Status: DC | PRN
  Administered 2016-05-27: 4 mg via INTRAVENOUS

## 2016-05-27 MED ORDER — SODIUM CHLORIDE 0.9 % IV SOLN
INTRAVENOUS | Status: DC
  Administered 2016-05-28: 03:00:00 via INTRAVENOUS

## 2016-05-27 MED ORDER — PROMETHAZINE HCL 25 MG/ML IJ SOLN
INTRAMUSCULAR | Status: AC
  Filled 2016-05-27: qty 1

## 2016-05-27 MED ORDER — INSULIN ASPART 100 UNIT/ML ~~LOC~~ SOLN: 0.0000 [IU] | Freq: Three times a day (TID) | SUBCUTANEOUS | Status: DC

## 2016-05-27 MED ORDER — PANTOPRAZOLE SODIUM 40 MG IV SOLR
INTRAVENOUS | Status: AC
  Filled 2016-05-27: qty 160

## 2016-05-27 MED ORDER — MIDAZOLAM HCL 5 MG/5ML IJ SOLN
INTRAMUSCULAR | Status: AC
  Filled 2016-05-27: qty 10

## 2016-05-27 MED ORDER — LIDOCAINE VISCOUS 2 % MT SOLN
OROMUCOSAL | Status: AC
  Filled 2016-05-27: qty 15

## 2016-05-27 MED ORDER — ONDANSETRON HCL 4 MG/2ML IJ SOLN: 4.0000 mg | Freq: Four times a day (QID) | INTRAMUSCULAR | Status: DC | PRN

## 2016-05-27 MED ORDER — OXYCODONE-ACETAMINOPHEN 5-325 MG PO TABS
1.0000 | ORAL_TABLET | ORAL | Status: DC | PRN
  Administered 2016-05-27: 1 via ORAL
  Filled 2016-05-27: qty 1

## 2016-05-27 MED ORDER — MIDAZOLAM HCL 5 MG/5ML IJ SOLN
INTRAMUSCULAR | Status: DC | PRN
  Administered 2016-05-27 (×2): 2 mg via INTRAVENOUS

## 2016-05-27 MED ORDER — MEPERIDINE HCL 100 MG/ML IJ SOLN
INTRAMUSCULAR | Status: DC | PRN
  Administered 2016-05-27: 25 mg via INTRAVENOUS
  Administered 2016-05-27: 50 mg via INTRAVENOUS

## 2016-05-27 MED ORDER — INSULIN ASPART 100 UNIT/ML ~~LOC~~ SOLN: 0.0000 [IU] | Freq: Every day | SUBCUTANEOUS | Status: DC

## 2016-05-27 MED ORDER — ONDANSETRON HCL 4 MG PO TABS: 4.0000 mg | ORAL_TABLET | Freq: Four times a day (QID) | ORAL | Status: DC | PRN

## 2016-05-27 MED ORDER — DEXTROSE-NACL 5-0.9 % IV SOLN
INTRAVENOUS | Status: DC
  Administered 2016-05-27 (×2): via INTRAVENOUS

## 2016-05-27 MED ORDER — STERILE WATER FOR IRRIGATION IR SOLN
Status: DC | PRN
  Administered 2016-05-27: 100 mL

## 2016-05-27 MED ORDER — PANTOPRAZOLE SODIUM 40 MG PO TBEC
40.0000 mg | DELAYED_RELEASE_TABLET | Freq: Every day | ORAL | Status: DC
  Administered 2016-05-27: 40 mg via ORAL
  Filled 2016-05-27 (×2): qty 1

## 2016-05-27 MED ORDER — MEPERIDINE HCL 100 MG/ML IJ SOLN
INTRAMUSCULAR | Status: AC
  Filled 2016-05-27: qty 2

## 2016-05-27 MED ORDER — LIDOCAINE VISCOUS 2 % MT SOLN
OROMUCOSAL | Status: DC | PRN
  Administered 2016-05-27: 4 mL via OROMUCOSAL

## 2016-05-27 MED ORDER — ONDANSETRON HCL 4 MG/2ML IJ SOLN
INTRAMUSCULAR | Status: AC
  Filled 2016-05-27: qty 2

## 2016-05-27 MED ORDER — SODIUM CHLORIDE 0.9 % IV SOLN: INTRAVENOUS | Status: DC

## 2016-05-27 NOTE — Op Note (Signed)
[facility] Patient Name: Jeremy Burgess Procedure Date: 05/27/2016 3:24 PM MRN: [id number] Date of Birth: 04/10/1966 Attending MD: Norvel Richards , MD CSN: [id number] Age: 50 Admit Type: Outpatient Procedure:                Upper GI endoscopy - diagnostic Indications:              Hematemesis Providers:                Norvel Richards, MD, Rosina Lowenstein, RN, Charlyne Petrin, RN, Purcell Nails. [place], Merchant [employer] officer Referring MD:              Medicines:                Midazolam 4 mg IV, Meperidine 75 mg IV, Ondansetron                            4 mg IV Complications:            No immediate complications. Estimated Blood Loss:     Estimated blood loss: none. Procedure:                Pre-Anesthesia Assessment:                           - Prior to the procedure, a History and Physical                            was performed, and patient medications and                            allergies were reviewed. The patient's tolerance of                            previous anesthesia was also reviewed. The risks                            and benefits of the procedure and the sedation                            options and risks were discussed with the patient.                            All questions were answered, and informed consent                            was obtained. Prior Anticoagulants: The patient has                            taken no previous anticoagulant or antiplatelet                            agents. ASA Grade Assessment: II - A patient with  mild systemic disease. After reviewing the risks                            and benefits, the patient was deemed in                            satisfactory condition to undergo the procedure.                           After obtaining informed consent, the endoscope was                            passed under direct vision. Throughout the                            procedure,  the patient's blood pressure, pulse, and                            oxygen saturations were monitored continuously. The                            [id number] scope was introduced through the                            mouth, and advanced to the second part of duodenum.                            The upper GI endoscopy was accomplished without                            difficulty. The patient tolerated the procedure                            well. Scope In: 3:35:29 PM Scope Out: 3:39:54 PM Total Procedure Duration: 0 hours 4 minutes 25 seconds  Findings:      The examined esophagus was normal.      The entire examined stomach was normal.      The duodenal bulb and second portion of the duodenum were normal. No       blood seen in the upper GI tract. Impression:               - Normal esophagus.                           - Normal stomach.                           - Normal duodenal bulb and second portion of the                            duodenum. I suspect trivial, if any, upper GI                            bleeding                           -  No specimens collected. Moderate Sedation:      Moderate (conscious) sedation was administered by the endoscopy nurse       and supervised by the endoscopist. The following parameters were       monitored: oxygen saturation, heart rate, blood pressure, respiratory       rate, EKG, adequacy of pulmonary ventilation, and response to care.       Total physician intraservice time was 12 minutes. Recommendation:           - Patient has a contact number available for                            emergencies. The signs and symptoms of potential                            delayed complications were discussed with the                            patient. Return to normal activities tomorrow.                            Written discharge instructions were provided to the                            patient.                           - Return  patient to hospital ward for ongoing care.                           - Diabetic (ADA) diet.                           - Continue present medications.                           - No repeat upper endoscopy. DC PPI infusion. Begin                            Protonix 40 mg orally once daily                           - Return to GI office in 1 month. We'll plan first                            ever screening colonoscopy. Procedure Code(s):        --- Professional ---                           [id number], Esophagogastroduodenoscopy, flexible,                            transoral; diagnostic, including collection of                            specimen(s) by brushing or washing, when performed                            (  separate procedure)                           99152, Moderate sedation services provided by the                            same physician or other qualified health care                            professional performing the diagnostic or                            therapeutic service that the sedation supports,                            requiring the presence of an independent trained                            observer to assist in the monitoring of the                            patient's level of consciousness and physiological                            status; initial 15 minutes of intraservice time,                            patient age 52 years or older Diagnosis Code(s):        --- Professional ---                           K92.0, Hematemesis CPT copyright 2016 [employer]. All rights reserved. The codes documented in this report are preliminary and upon coder review may  be revised to meet current compliance requirements. Cristopher Estimable. Artice Holohan, MD Norvel Richards, MD 05/27/2016 3:52:12 PM This report has been signed electronically. Number of Addenda: 0

## 2016-05-27 NOTE — Progress Notes (Signed)
PROGRESS NOTE    Jeremy Burgess  [id number] DOB: March 21, 1966 DOA: 05/26/2016 PCP: Royce Macadamia D., PA-C    Brief Narrative:  Patient is a 50 year old man with a history of type 2 diabetes and hypertension, who presented to the ED on 05/26/2016 after vomiting blood. It occurs several hours after eating grilled chicken from [facility]. In the ED, he was afebrile and bradycardia. His blood pressure was within normal limits. His chest x-ray, CT angiogram of the chest, and CT of the abdomen and pelvis were unremarkable, but revealed old healed multiple rib fractures. His hemoglobin was 12. His glucose was 191. His BUN was 25 and his creatinine was 1.35. He was admitted for further evaluation and management.   Assessment & Plan:   Principal Problem:   Upper GI bleed Active Problems:   Acute blood loss anemia   AKI (acute kidney injury) ([place])   Insulin dependent diabetes mellitus (HCC)   HTN (hypertension)   Chronic pain due to trauma   1. Upper GI bleeding causing acute blood loss anemia. Patient was started on a Protonix drip. IV fluids were ordered for hydration. His CBC is being monitored daily. His follow-up hemoglobin fell from 12.0-11.3. No need for packed red blood cell transfusion at this time. He denies further hematemesis this morning. -GI has been consulted and plans an upper endoscopy today. -Continue supportive treatment.  Acute kidney injury, secondary to prerenal azotemia. The patient's creatinine was within normal limits 10/2015. On admission, it was 1.35. He was started on IV fluids for hydration. Lisinopril is being held. -With IV fluid hydration, his creatinine has improved.  Hypertension. Patient is treated chronically with lisinopril. It is being held due to acute kidney injury and soft blood pressures.  Insulin-dependent diabetes. Patient is treated chronically with twice a day dosing of Levemir. His glucose was elevated on admission. Levemir was withheld on  admission. -We'll start sliding-scale NovoLog.  Bradycardia, sinus. Patient's heart rate has been ranging from 49-55. He denies chest pain. His TSH was mildly elevated at 5.1, not likely clinical hypothyroidism, but will order a free T4. -We'll continue to monitor.   DVT prophylaxis: SCDs Code Status: Full code Family Communication: Family not available Disposition Plan: Discharge to home when clinically appropriate   Consultants:   Gastroenterology  Procedures:   EGD pending  Antimicrobials:  None    Subjective: Patient has had some nausea but no vomiting this morning. He denies abdominal pain.  Objective: Vitals:   05/27/16 0600 05/27/16 0700 05/27/16 0800 05/27/16 0900  BP: 116/68  115/67   Pulse: (!) 48 (!) 50 (!) 49 (!) 49  Resp: 16 16 12 12   Temp:      TempSrc:      SpO2: 97% 96% 97% 96%  Weight:      Height:        Intake/Output Summary (Last 24 hours) at 05/27/16 1000 Last data filed at 05/27/16 0500  Gross per 24 hour  Intake                3 ml  Output              450 ml  Net             -447 ml   Filed Weights   05/26/16 1709  Weight: 105.2 kg (232 lb)    Examination:  General exam: Appears calm and comfortable  Respiratory system: Clear to auscultation. Respiratory effort normal. Cardiovascular system: S1 & S2 with a soft  systolic murmur and bradycardia. No pedal edema. Gastrointestinal system: Abdomen is nondistended, soft and nontender. No organomegaly or masses felt. Normal bowel sounds heard. Central nervous system: Alert and oriented. No focal neurological deficits. Extremities: Symmetric 5 x 5 power. Skin: No rashes, lesions or ulcers Psychiatry: Judgement and insight appear normal. Mood & affect appropriate.     Data Reviewed: I have personally reviewed following labs and imaging studies  CBC:  Recent Labs Lab 05/26/16 1757 05/26/16 2046 05/27/16 0118 05/27/16 0411  WBC 8.4  --  7.9 7.8  NEUTROABS 6.2  --   --   --     HGB 12.1* 11.3* 12.0* 11.3*  HCT 36.7* 33.5* 36.4* 34.3*  MCV 86.6  --  86.5 86.2  PLT 194  --  194 0000000   Basic Metabolic Panel:  Recent Labs Lab 05/26/16 1757 05/27/16 0411  NA 139 137  K 4.0 4.5  CL 108 106  CO2 26 27  GLUCOSE 191* 121*  BUN 25* 23*  CREATININE 1.35* 1.23  CALCIUM 8.8* 8.7*   GFR: Estimated Creatinine Clearance: 86.7 mL/min (by C-G formula based on SCr of 1.23 mg/dL). Liver Function Tests:  Recent Labs Lab 05/26/16 1757 05/27/16 0411  AST 67* 40  ALT 35 34  ALKPHOS 102 96  BILITOT 0.7 0.7  PROT 7.6 6.8  ALBUMIN 4.1 3.7    Recent Labs Lab 05/26/16 1757  LIPASE 32   No results for input(s): AMMONIA in the last 168 hours. Coagulation Profile:  Recent Labs Lab 05/26/16 1757  INR 0.92   Cardiac Enzymes:  Recent Labs Lab 05/26/16 1757  TROPONINI <0.03   BNP (last 3 results) No results for input(s): PROBNP in the last 8760 hours. HbA1C: No results for input(s): HGBA1C in the last 72 hours. CBG: No results for input(s): GLUCAP in the last 168 hours. Lipid Profile: No results for input(s): CHOL, HDL, LDLCALC, TRIG, CHOLHDL, LDLDIRECT in the last 72 hours. Thyroid Function Tests:  Recent Labs  05/27/16 0411  TSH 5.157*   Anemia Panel: No results for input(s): VITAMINB12, FOLATE, FERRITIN, TIBC, IRON, RETICCTPCT in the last 72 hours. Sepsis Labs: No results for input(s): PROCALCITON, LATICACIDVEN in the last 168 hours.  No results found for this or any previous visit (from the past 240 hour(s)).       Radiology Studies: Dg Chest 2 View  Result Date: 05/26/2016 CLINICAL DATA:  Chest pain and abdominal pain EXAM: CHEST  2 VIEW COMPARISON:  10/25/2015 FINDINGS: Cardiac enlargement without heart failure. Lungs are clear without infiltrate or effusion. Chronic displaced right rib fractures with mild pleural thickening unchanged from the prior study. Mild chronic compression fracture T12 unchanged. No pleural effusion.  IMPRESSION: No active cardiopulmonary disease. Electronically Signed   By: Franchot Gallo M.D.   On: 05/26/2016 19:41   Ct Angio Chest Pe W/cm &/or Wo Cm  Result Date: 05/26/2016 CLINICAL DATA:  Initial evaluation for acute right chest and abdominal pain. EXAM: CT ANGIOGRAPHY CHEST WITH CONTRAST TECHNIQUE: Multidetector CT imaging of the chest was performed using the standard protocol during bolus administration of intravenous contrast. Multiplanar CT image reconstructions and MIPs were obtained to evaluate the vascular anatomy. COMPARISON:  None. FINDINGS: Cardiovascular: Intrathoracic aorta of normal caliber and appearance without acute abnormality. Visualized great vessels intact and normal. Heart size normal. No significant pericardial fusion. Pulmonary arteries are adequately opacified for evaluation. Main pulmonary artery within normal limits for size. No filling defect to suggest acute pulmonary embolism. Re-formatted imaging confirms these  findings. Mediastinum/Nodes: Visualized thyroid is normal. No enlarged mediastinal, hilar, or axillary lymph nodes identified. Esophagus is normal. Lungs/Pleura: Irregular remote right-sided rib fractures associated atelectasis/ scar. Minimal atelectasis within the peripheral left lung base. Lungs are otherwise clear. No focal infiltrates. No pulmonary edema or pleural effusion. No pneumothorax. No worrisome pulmonary nodule or mass. Upper Abdomen: Visualized upper abdomen unremarkable. Musculoskeletal: Multiple remotely healed right-sided rib fractures noted. Irregularity present at the partially visualized distal right clavicle (series 5, image 1). Left clavicle within normal limits. No other acute osseous abnormality. No worrisome lytic or blastic osseous lesions. Review of the MIP images confirms the above findings. IMPRESSION: 1. No CT evidence for acute pulmonary embolism. 2. No other acute cardiopulmonary abnormality identified. 3. Somewhat age indeterminate  deformity of the partially visualized distal right clavicle. While this may be chronic in nature, possible acute or subacute abnormality is not entirely excluded. Further evaluation with dedicated x-ray the right clavicle is suggested if there is clinical concern for possible acute or subacute trauma to this region. 4. Multiple remotely healed right-sided rib fractures with irregularity. Associated mild atelectasis/scarring within the adjacent right lung. Electronically Signed   By: Jeannine Boga M.D.   On: 05/26/2016 19:47   Ct Abdomen Pelvis W Contrast  Result Date: 05/26/2016 CLINICAL DATA:  Initial evaluation for acute right-sided chest and abdominal pain. EXAM: CT ABDOMEN AND PELVIS WITH CONTRAST TECHNIQUE: Multidetector CT imaging of the abdomen and pelvis was performed using the standard protocol following bolus administration of intravenous contrast. CONTRAST:  100 cc of Isovue 370. COMPARISON:  None available. FINDINGS: Lower chest: Mild atelectasis/ scar present within the bilateral lung bases. Visualized lungs are otherwise clear. No pleural or pericardial effusion. Hepatobiliary: Liver within normal limits. Gallbladder within normal limits. No biliary dilatation. Pancreas: Pancreas within normal limits. Spleen: Spleen within normal limits. Adrenals/Urinary Tract: Adrenal glands are normal. Kidneys equal in size with symmetric enhancement. No nephrolithiasis, hydronephrosis, or focal enhancing renal mass. Ureters are normal caliber without acute abnormality. Bladder within normal limits. Stomach/Bowel: Stomach within normal limits. No evidence for bowel obstruction. Appendix normal. No acute inflammatory changes about the bowels. Vascular/Lymphatic: Normal intravascular enhancement seen within the intra-abdominal aorta and its branch vessels. No adenopathy. Reproductive: Prostate normal. Other: No free air or fluid. Musculoskeletal: Multiple remotely healed right-sided rib fractures noted.  Wedging deformity of the T12 vertebral body appears chronic in nature. No worrisome lytic or blastic osseous lesions. Fixation rod partially visualize within the right femur. Chronic bilateral pars defects at L5 with associated 6 mm spondylolisthesis. IMPRESSION: 1. No CT evidence for acute intra-abdominal pelvic process. 2. Multiple remotely healed right-sided rib fractures. 3. Chronic wedging deformity of the T12 vertebral body. No acute osseous abnormality identified. 4. Chronic 6 mm spondylolisthesis of L5 on S1. Electronically Signed   By: Jeannine Boga M.D.   On: 05/26/2016 19:56        Scheduled Meds: . promethazine  25 mg Intravenous On Call  . sodium chloride flush  3 mL Intravenous Q12H   Continuous Infusions: . dextrose 5 % and 0.9% NaCl 100 mL/hr at 05/27/16 0155  . pantoprozole (PROTONIX) infusion 8 mg/hr (05/27/16 0155)     LOS: 0 days    Time spent: 3 minutes    Rexene Alberts, MD [facility] Pager [phone]  If 7PM-7AM, please contact night-coverage [email or web address] Password [email or web address] 05/27/2016, 10:00 AM

## 2016-05-27 NOTE — Consult Note (Signed)
Referring Provider: Dr. Marin Comment  Primary Care Physician:  MUSE,ROCHELLE D., PA-C Primary Gastroenterologist:  Dr. [name]   Date of Admission: 05/26/16 Date of Consultation: 05/27/16  Reason for Consultation:  Hematemesis   HPI:  Jeremy Burgess is a 50 y.o. year old male presenting to the ED secondary to hematemesis. GI now consulted for evaluation. Admitting Hgb 12.1. Appears his Hgb was 12.3 approximately 7 months ago. This morning, he is 11.3.   States he had an episode of nausea and vomiting yesterday evening after eating grilled chicken, mac n cheese, biscuits, from [facility]. Noted associated diaphoresis, abdominal pain, and noted bright red blood in his emesis. States it was about a "cupful" of blood. Came straight to the ED after this episode. No further evidence of hematemesis. No further nausea. Still feels sore in upper abdomen. No NSAIDs. No aspirin powders. Denies any chronic GERD symptoms. No dysphagia. States he had another episode of hematemesis months ago prompting ED evaluation but was not admitted. Heme negative in ED. No melena.   Last EGD in 2009 completed due to complaints of hematemesis with normal esophagus, tiny antral erosions, normal duodenum. No prior colonoscopy.   Past Medical History:  Diagnosis Date  . Accidentally struck by falling tree 02/08/2015  . Acute blood loss anemia 02/09/2015  . Acute respiratory failure ([place]) 02/09/2015  . Chronic pain due to trauma   . Diabetes mellitus   . Diabetes mellitus ([place])   . History of blood clots    DVT  . Hypertension   . Pneumothorax   . Postlaminectomy syndrome, lumbar region 11/13/2015  . Rib fracture   . Smoker 0   denies smoking  . Thrombocytopenia ([place]) 02/09/2015  . Traumatic closed displaced fracture of multiple ribs of right side 02/08/2015  . Traumatic hemopneumothorax 02/09/2015    Past Surgical History:  Procedure Laterality Date  . BACK SURGERY    . ESOPHAGOGASTRODUODENOSCOPY  2009   EGD completed by Dr.  [name] due to hematemesis. Findings of normal esophagus, tiny antral erosions, normal D1, D2  . EXTERNAL FIXATION LEG Right 02/09/2015   Procedure: EXTERNAL FIXATION LEG;  Surgeon: [name], MD;  Location: [place];  Service: Orthopedics;  Laterality: Right;  . EXTERNAL FIXATION REMOVAL Right 02/14/2015   Procedure: REMOVAL EXTERNAL FIXATION LEG;  Surgeon: [name], MD;  Location: [place];  Service: Orthopedics;  Laterality: Right;  . FEMUR IM NAIL Right 02/09/2015   Procedure: INTRAMEDULLARY (IM) RETROGRADE FEMORAL NAILING;  Surgeon: [name], MD;  Location: [place];  Service: Orthopedics;  Laterality: Right;  . NOSE SURGERY    . ORIF ANKLE FRACTURE Right 02/14/2015   Procedure: OPEN REDUCTION INTERNAL FIXATION (ORIF) PILON FRACTURE;  Surgeon: [name], MD;  Location: [place];  Service: Orthopedics;  Laterality: Right;  . ORIF ULNAR FRACTURE Right 02/09/2015   Procedure: OPEN REDUCTION INTERNAL FIXATION (ORIF) ULNAR FRACTURE;  Surgeon: [name], MD;  Location: [place];  Service: Orthopedics;  Laterality: Right;    Prior to Admission medications   Medication Sig Start Date End Date Taking? Authorizing Provider  insulin detemir (LEVEMIR) 100 UNIT/ML injection Inject 0.1 mLs (10 Units total) into the skin 2 (two) times daily. Patient taking differently: Inject 60 Units into the skin at bedtime.  03/16/15  Yes Daniel J Angiulli, PA-C  lisinopril (PRINIVIL,ZESTRIL) 20 MG tablet Take 20 mg by mouth daily. Takes with the 20/25 combo   Yes Historical Provider, MD    Current Facility-Administered Medications  Medication Dose Route Frequency Provider Last Rate Last Dose  . dextrose 5 %-0.9 % sodium chloride infusion   Intravenous Continuous Orvan Falconer, MD 100 mL/hr at 05/27/16 0155    . ondansetron (ZOFRAN) tablet 4 mg  4 mg Oral Q6H PRN Orvan Falconer, MD       Or  . ondansetron [facility]) injection 4 mg  4 mg Intravenous Q6H PRN Orvan Falconer, MD      . oxyCODONE-acetaminophen  (PERCOCET/ROXICET) 5-325 MG per tablet 1-2 tablet  1-2 tablet Oral Q4H PRN Orvan Falconer, MD   1 tablet at 05/27/16 0458  . pantoprazole (PROTONIX) 80 mg in sodium chloride 0.9 % 250 mL (0.32 mg/mL) infusion  8 mg/hr Intravenous Continuous Orvan Falconer, MD 25 mL/hr at 05/27/16 0155 8 mg/hr at 05/27/16 0155  . sodium chloride flush (NS) 0.9 % injection 3 mL  3 mL Intravenous Q12H Orvan Falconer, MD   3 mL at 05/27/16 0115    Allergies as of 05/26/2016 - Review Complete 05/26/2016  Allergen Reaction Noted  . Bee venom  07/22/2011  . Bee venom Other (See Comments) 02/08/2015    Family History  Problem Relation Age of Onset  . Diabetes Mother   . COPD Mother   . Colon cancer Neg Hx     Social History   Social History  . Marital status: Single    Spouse name: N/A  . Number of children: N/A  . Years of education: N/A   Occupational History  . Not on file.   Social History Main Topics  . Smoking status: Never Smoker  . Smokeless tobacco: Never Used  . Alcohol use No     Comment: history of alcohol abuse but quit in 2008   . Drug use: No  . Sexual activity: Not on file   Other Topics Concern  . Not on file   Social History Narrative   ** Merged History Encounter **        Review of Systems: Gen: Denies fever, chills, loss of appetite, change in weight or weight loss CV: Denies chest pain, heart palpitations, syncope, edema  Resp: Denies shortness of breath with rest, cough, wheezing GI: see HPI  GU : Denies urinary burning, urinary frequency, urinary incontinence.  MS: +joint pain  Derm: Denies rash, itching, dry skin Psych:  +depression  Heme: see HPI   Physical Exam: Vital signs in last 24 hours: Temp:  [98.1 F (36.7 C)-98.7 F (37.1 C)] 98.1 F (36.7 C) (10/16 0135) Pulse Rate:  [50-70] 50 (10/16 0430) Resp:  [11-24] 16 (10/16 0430) BP: (106-159)/(64-85) 106/68 (10/16 0430) SpO2:  [95 %-100 %] 95 % (10/16 0430) Weight:  [232 lb (105.2 kg)] 232 lb (105.2 kg) (10/15  1709)   General:   Alert,  Well-developed, well-nourished, pleasant and cooperative in NAD Head:  Normocephalic and atraumatic. Eyes:  Sclera clear, no icterus.   Conjunctiva pink. Ears:  Normal auditory acuity. Nose:  No deformity, discharge,  or lesions. Mouth:  No deformity or lesions, dentition normal. Lungs:  Clear throughout to auscultation.   No wheezes, crackles, or rhonchi. No acute distress. Heart:  Regular rate and rhythm; no murmurs, clicks, rubs,  or gallops. Abdomen:  Soft, no TTP, obese and nondistended. No masses, hepatosplenomegaly or hernias noted. Normal bowel sounds, without guarding, and without rebound.   Rectal:  Deferred until time of colonoscopy.   Msk:  Symmetrical without gross deformities. Normal posture. Extremities:  Without  edema. Neurologic:  Alert and  oriented x4  Psych:  Alert and cooperative. Normal mood and affect.  Intake/Output from previous day: 10/15 0701 - 10/16 0700 In: 3 [I.V.:3] Out: 450 [Urine:450] Intake/Output this shift: No intake/output data recorded.  Lab Results:  Recent Labs  05/26/16 1757 05/26/16 2046 05/27/16 0118 05/27/16 0411  WBC 8.4  --  7.9 7.8  HGB 12.1* 11.3* 12.0* 11.3*  HCT 36.7* 33.5* 36.4* 34.3*  PLT 194  --  194 188   BMET  Recent Labs  05/26/16 1757 05/27/16 0411  NA 139 137  K 4.0 4.5  CL 108 106  CO2 26 27  GLUCOSE 191* 121*  BUN 25* 23*  CREATININE 1.35* 1.23  CALCIUM 8.8* 8.7*   LFT  Recent Labs  05/26/16 1757 05/27/16 0411  PROT 7.6 6.8  ALBUMIN 4.1 3.7  AST 67* 40  ALT 35 34  ALKPHOS 102 96  BILITOT 0.7 0.7   PT/INR  Recent Labs  05/26/16 1757  LABPROT 12.3  INR 0.92    Studies/Results: Dg Chest 2 View  Result Date: 05/26/2016 CLINICAL DATA:  Chest pain and abdominal pain EXAM: CHEST  2 VIEW COMPARISON:  10/25/2015 FINDINGS: Cardiac enlargement without heart failure. Lungs are clear without infiltrate or effusion. Chronic displaced right rib fractures with mild  pleural thickening unchanged from the prior study. Mild chronic compression fracture T12 unchanged. No pleural effusion. IMPRESSION: No active cardiopulmonary disease. Electronically Signed   By: Franchot Gallo M.D.   On: 05/26/2016 19:41   Ct Angio Chest Pe W/cm &/or Wo Cm  Result Date: 05/26/2016 CLINICAL DATA:  Initial evaluation for acute right chest and abdominal pain. EXAM: CT ANGIOGRAPHY CHEST WITH CONTRAST TECHNIQUE: Multidetector CT imaging of the chest was performed using the standard protocol during bolus administration of intravenous contrast. Multiplanar CT image reconstructions and MIPs were obtained to evaluate the vascular anatomy. COMPARISON:  None. FINDINGS: Cardiovascular: Intrathoracic aorta of normal caliber and appearance without acute abnormality. Visualized great vessels intact and normal. Heart size normal. No significant pericardial fusion. Pulmonary arteries are adequately opacified for evaluation. Main pulmonary artery within normal limits for size. No filling defect to suggest acute pulmonary embolism. Re-formatted imaging confirms these findings. Mediastinum/Nodes: Visualized thyroid is normal. No enlarged mediastinal, hilar, or axillary lymph nodes identified. Esophagus is normal. Lungs/Pleura: Irregular remote right-sided rib fractures associated atelectasis/ scar. Minimal atelectasis within the peripheral left lung base. Lungs are otherwise clear. No focal infiltrates. No pulmonary edema or pleural effusion. No pneumothorax. No worrisome pulmonary nodule or mass. Upper Abdomen: Visualized upper abdomen unremarkable. Musculoskeletal: Multiple remotely healed right-sided rib fractures noted. Irregularity present at the partially visualized distal right clavicle (series 5, image 1). Left clavicle within normal limits. No other acute osseous abnormality. No worrisome lytic or blastic osseous lesions. Review of the MIP images confirms the above findings. IMPRESSION: 1. No CT evidence  for acute pulmonary embolism. 2. No other acute cardiopulmonary abnormality identified. 3. Somewhat age indeterminate deformity of the partially visualized distal right clavicle. While this may be chronic in nature, possible acute or subacute abnormality is not entirely excluded. Further evaluation with dedicated x-ray the right clavicle is suggested if there is clinical concern for possible acute or subacute trauma to this region. 4. Multiple remotely healed right-sided rib fractures with irregularity. Associated mild atelectasis/scarring within the adjacent right lung. Electronically Signed   By: Jeannine Boga M.D.   On: 05/26/2016 19:47   Ct Abdomen Pelvis W Contrast  Result Date: 05/26/2016 CLINICAL DATA:  Initial evaluation for acute right-sided chest  and abdominal pain. EXAM: CT ABDOMEN AND PELVIS WITH CONTRAST TECHNIQUE: Multidetector CT imaging of the abdomen and pelvis was performed using the standard protocol following bolus administration of intravenous contrast. CONTRAST:  100 cc of Isovue 370. COMPARISON:  None available. FINDINGS: Lower chest: Mild atelectasis/ scar present within the bilateral lung bases. Visualized lungs are otherwise clear. No pleural or pericardial effusion. Hepatobiliary: Liver within normal limits. Gallbladder within normal limits. No biliary dilatation. Pancreas: Pancreas within normal limits. Spleen: Spleen within normal limits. Adrenals/Urinary Tract: Adrenal glands are normal. Kidneys equal in size with symmetric enhancement. No nephrolithiasis, hydronephrosis, or focal enhancing renal mass. Ureters are normal caliber without acute abnormality. Bladder within normal limits. Stomach/Bowel: Stomach within normal limits. No evidence for bowel obstruction. Appendix normal. No acute inflammatory changes about the bowels. Vascular/Lymphatic: Normal intravascular enhancement seen within the intra-abdominal aorta and its branch vessels. No adenopathy. Reproductive:  Prostate normal. Other: No free air or fluid. Musculoskeletal: Multiple remotely healed right-sided rib fractures noted. Wedging deformity of the T12 vertebral body appears chronic in nature. No worrisome lytic or blastic osseous lesions. Fixation rod partially visualize within the right femur. Chronic bilateral pars defects at L5 with associated 6 mm spondylolisthesis. IMPRESSION: 1. No CT evidence for acute intra-abdominal pelvic process. 2. Multiple remotely healed right-sided rib fractures. 3. Chronic wedging deformity of the T12 vertebral body. No acute osseous abnormality identified. 4. Chronic 6 mm spondylolisthesis of L5 on S1. Electronically Signed   By: Jeannine Boga M.D.   On: 05/26/2016 19:56    Impression: 50 year old male presenting with nausea, vomiting, and associated hematemesis after eating [place] [employer], with improved symptoms this morning. Hgb with mild drift likely multifactorial. No melena or further overt GI bleeding. He has noted several prior incidences earlier this year prompting ED evaluation, and he had a similar presentation in 2009 and underwent EGD without significant findings. Query MW tear in this setting. As of note, he has no history of liver disease (CT reviewed, on file this admission). Although he had clear liquids this morning, an EGD could be performed later this afternoon for diagnostic purposes. Will make NPO.   EGD for diagnostic purposes later today with Dr. [name]. I discussed risks and benefits with patient, and he stated understanding. Will add Phenergan 25 mg IV on call to augment sedation.   Plan: NPO Continue Protonix infusion for now until after EGD EGD with Dr. [name] today Phenergan 25 mg IV on call   Annitta Needs, ANP-BC [facility]     LOS: 0 days    05/27/2016, 8:32 AM

## 2016-05-28 ENCOUNTER — Encounter: Payer: Self-pay | Admitting: Internal Medicine

## 2016-05-28 ENCOUNTER — Encounter (HOSPITAL_COMMUNITY): Payer: Self-pay | Admitting: Internal Medicine

## 2016-05-28 ENCOUNTER — Telehealth: Payer: Self-pay | Admitting: Gastroenterology

## 2016-05-28 DIAGNOSIS — G8921 Chronic pain due to trauma: Secondary | ICD-10-CM

## 2016-05-28 LAB — T4, FREE: FREE T4: 0.8 ng/dL (ref 0.61–1.12)

## 2016-05-28 LAB — CBC
HCT: 34 % — ABNORMAL LOW (ref 39.0–52.0)
Hemoglobin: 11.1 g/dL — ABNORMAL LOW (ref 13.0–17.0)
MCH: 28.5 pg (ref 26.0–34.0)
MCHC: 32.6 g/dL (ref 30.0–36.0)
MCV: 87.2 fL (ref 78.0–100.0)
PLATELETS: 180 10*3/uL (ref 150–400)
RBC: 3.9 MIL/uL — AB (ref 4.22–5.81)
RDW: 13.4 % (ref 11.5–15.5)
WBC: 6.8 10*3/uL (ref 4.0–10.5)

## 2016-05-28 LAB — BASIC METABOLIC PANEL
ANION GAP: 4 — AB (ref 5–15)
BUN: 12 mg/dL (ref 6–20)
CALCIUM: 8.4 mg/dL — AB (ref 8.9–10.3)
CO2: 25 mmol/L (ref 22–32)
Chloride: 108 mmol/L (ref 101–111)
Creatinine, Ser: 0.94 mg/dL (ref 0.61–1.24)
GLUCOSE: 90 mg/dL (ref 65–99)
POTASSIUM: 4.3 mmol/L (ref 3.5–5.1)
SODIUM: 137 mmol/L (ref 135–145)

## 2016-05-28 LAB — GLUCOSE, CAPILLARY: GLUCOSE-CAPILLARY: 88 mg/dL (ref 65–99)

## 2016-05-28 MED ORDER — PANTOPRAZOLE SODIUM 40 MG PO TBEC
40.0000 mg | DELAYED_RELEASE_TABLET | Freq: Every day | ORAL | Status: DC
  Administered 2016-05-28: 40 mg via ORAL
  Filled 2016-05-28: qty 1

## 2016-05-28 MED ORDER — PANTOPRAZOLE SODIUM 40 MG PO TBEC: 40.0000 mg | DELAYED_RELEASE_TABLET | Freq: Every day | ORAL | 1 refills | Status: DC

## 2016-05-28 MED ORDER — ALBUTEROL SULFATE (2.5 MG/3ML) 0.083% IN NEBU
2.5000 mg | INHALATION_SOLUTION | Freq: Four times a day (QID) | RESPIRATORY_TRACT | Status: DC
  Administered 2016-05-28: 2.5 mg via RESPIRATORY_TRACT
  Filled 2016-05-28: qty 3

## 2016-05-28 MED ORDER — LISINOPRIL 10 MG PO TABS
20.0000 mg | ORAL_TABLET | Freq: Every day | ORAL | Status: DC
  Administered 2016-05-28: 20 mg via ORAL
  Filled 2016-05-28: qty 2

## 2016-05-28 MED ORDER — INSULIN DETEMIR 100 UNIT/ML ~~LOC~~ SOLN
60.0000 [IU] | Freq: Every day | SUBCUTANEOUS | Status: DC
Start: 2016-05-28 — End: 2019-07-13

## 2016-05-28 NOTE — Care Management Note (Signed)
Case Management Note  Patient Details  Name: MATRIM GARDY MRN: [id number] Date of Birth: [birth year]  Subjective/Objective:                  Pt admitted with GIB. He is from home. He is ind with ADL's. He receives PCP [facility] at [facility]. Pt will f/u with GI. Pt is uninsured. FC is aware and has worked with [facility] in past. Pt plans to return home with self care. Pt Rx protonix at [place]. [place] coupon printed for pt but pt DC'd prior to receiving coupon. RN aware coupon printed if pt calls back about cost.   Expected Discharge Date:    05/28/2016              Expected Discharge Plan:  Home/Self Care  In-House Referral:  Financial Counselor  Discharge planning Services  CM Consult, Medication Assistance  Post Acute Care Choice:  NA Choice offered to:  NA   Status of Service:  Completed, signed off  Sherald Barge, RN 05/28/2016, 10:12 AM

## 2016-05-28 NOTE — Progress Notes (Signed)
Being discharged. All lines removed. Patient able to dress himself and taken down to waiting area by wheel chair. Also given AVS and prescriptions.

## 2016-05-28 NOTE — Discharge Summary (Signed)
Physician Discharge Summary  Jeremy Burgess [id number] DOB: Feb 25, 1966 DOA: 05/26/2016  PCP: MUSE,ROCHELLE D., PA-C  Admit date: 05/26/2016 Discharge date: 05/28/2016  Time spent: Greater than 30 minutes  Recommendations for Outpatient Follow-up:  1. [name] will call the patient for scheduling an screening colonoscopy.  2. Free T4 is pending at the time of discharge. It will need to be followed up on. (TSH was mildly elevated at 5.1). 3. Patient had asymptomatic bradycardia during hospitalization. Would recommend a cardiology consultation if he becomes symptomatic.    Discharge Diagnoses:  1. Trivial upper GI bleed; etiology clinically undetermined as the EGD was unremarkable. 2. Mild acute blood loss anemia. 3. Acute kidney injury secondary to prerenal azotemia. 4. Insulin-dependent diabetes mellitus. 5. Chronic pain due to previous trauma. 6. Essential hypertension.  Discharge Condition: Improved  Diet recommendation: Heart healthy/carbohydrate modified  Filed Weights   05/26/16 1709 05/28/16 0500  Weight: 105.2 kg (232 lb) 105 kg (231 lb 7.7 oz)    History of present illness:  Patient is a 50 year old man with a history of type 2 diabetes and hypertension, who presented to the ED on 05/26/2016 after vomiting blood. It occurs several hours after eating grilled chicken from [facility]. In the ED, he was afebrile and bradycardia. His blood pressure was within normal limits. His chest x-ray, CT angiogram of the chest, and CT of the abdomen and pelvis were unremarkable, but revealed old healed multiple rib fractures. His hemoglobin was 12. His glucose was 191. His BUN was 25 and his creatinine was 1.35. He was admitted for further evaluation and management.  Hospital Course:   1. Upper GI bleeding causing acute blood loss anemia. Patient was started on a Protonix drip. IV fluids were given for hydration. His CBC was monitored daily. His follow-up hemoglobin  gradually fell from 12.0-11.1. There was no indication for a packed red blood cell transfusion. -GI, Dr. [name] was consulted. He performed an upper endoscopy which was essentially negative. He believed that the upper GI bleeding was trivial. He recommended continued daily PPI for several more weeks. -He will arrange an outpatient screening colonoscopy. -The patient had no nausea, vomiting, or hematemesis during the hospitalization.  Acute kidney injury, secondary to prerenal azotemia. The patient's creatinine was within normal limits 10/2015. On admission, it was 1.35. He was started on IV fluids for hydration. Lisinopril was temporarily held. With hydration, his creatinine normalized to 0.94.  Hypertension. Patient is treated chronically with lisinopril. It was held due to acute kidney injury and soft blood pressures. His blood pressure improved and lisinopril was restarted upon discharge.  Insulin-dependent diabetes. Patient is treated chronically with twice a day dosing of Levemir. His glucose was elevated on admission. Levemir was withheld and he was started on sliding scale NovoLog. His CBGs were controlled. He was instructed to resume Levemir at the time of discharge.   Bradycardia, sinus. Patient's heart rate ranged from 45-62. He denied chest pain. He is not treated with a calcium channel blocker or beta blocker. His TSH was mildly elevated at 5.1, not likely clinical hypothyroidism. Free T4 was ordered and the result was pending at the time of discharge. -Would recommend a cardiology referral if he develops symptomatic bradycardia.    Procedures:  EGD 05/27/16 by Dr. [name]: Normal esophagus, stomach and duodenum.  Consultations:  Gastroenterology  Discharge Exam: Vitals:   05/28/16 0718 05/28/16 0800  BP:  (!) 145/68  Pulse: (!) 52 62  Resp: 11 18  Temp: 98.4 F (36.9  C)     General exam: Appears calm and comfortable  Respiratory system: Rare wheezes. Respiratory  effort normal. Cardiovascular system: S1 & S2 with a soft systolic murmur and bradycardia. No pedal edema. Gastrointestinal system: Abdomen is nondistended, soft and nontender. No organomegaly or masses felt. Normal bowel sounds heard. Central nervous system: Alert and oriented. No focal neurological deficits. Extremities: Symmetric 5 x 5 power. Skin: No rashes, lesions or ulcers Psychiatry: Judgement and insight appear normal. Mood & affect appropriate.    Discharge Instructions   Discharge Instructions    Diet - low sodium heart healthy    Complete by:  As directed    Diet Carb Modified    Complete by:  As directed    Increase activity slowly    Complete by:  As directed      Current Discharge Medication List    START taking these medications   Details  pantoprazole (PROTONIX) 40 MG tablet Take 1 tablet (40 mg total) by mouth daily. Qty: 30 tablet, Refills: 1      CONTINUE these medications which have CHANGED   Details  insulin detemir (LEVEMIR) 100 UNIT/ML injection Inject 0.6 mLs (60 Units total) into the skin at bedtime.      CONTINUE these medications which have NOT CHANGED   Details  lisinopril (PRINIVIL,ZESTRIL) 20 MG tablet Take 20 mg by mouth daily. Takes with the 20/25 combo       Allergies  Allergen Reactions  . Bee Venom   . Bee Venom Other (See Comments)    intolerance      The results of significant diagnostics from this hospitalization (including imaging, microbiology, ancillary and laboratory) are listed below for reference.    Significant Diagnostic Studies: Dg Chest 2 View  Result Date: 05/26/2016 CLINICAL DATA:  Chest pain and abdominal pain EXAM: CHEST  2 VIEW COMPARISON:  10/25/2015 FINDINGS: Cardiac enlargement without heart failure. Lungs are clear without infiltrate or effusion. Chronic displaced right rib fractures with mild pleural thickening unchanged from the prior study. Mild chronic compression fracture T12 unchanged. No pleural  effusion. IMPRESSION: No active cardiopulmonary disease. Electronically Signed   By: Franchot Gallo M.D.   On: 05/26/2016 19:41   Ct Angio Chest Pe W/cm &/or Wo Cm  Result Date: 05/26/2016 CLINICAL DATA:  Initial evaluation for acute right chest and abdominal pain. EXAM: CT ANGIOGRAPHY CHEST WITH CONTRAST TECHNIQUE: Multidetector CT imaging of the chest was performed using the standard protocol during bolus administration of intravenous contrast. Multiplanar CT image reconstructions and MIPs were obtained to evaluate the vascular anatomy. COMPARISON:  None. FINDINGS: Cardiovascular: Intrathoracic aorta of normal caliber and appearance without acute abnormality. Visualized great vessels intact and normal. Heart size normal. No significant pericardial fusion. Pulmonary arteries are adequately opacified for evaluation. Main pulmonary artery within normal limits for size. No filling defect to suggest acute pulmonary embolism. Re-formatted imaging confirms these findings. Mediastinum/Nodes: Visualized thyroid is normal. No enlarged mediastinal, hilar, or axillary lymph nodes identified. Esophagus is normal. Lungs/Pleura: Irregular remote right-sided rib fractures associated atelectasis/ scar. Minimal atelectasis within the peripheral left lung base. Lungs are otherwise clear. No focal infiltrates. No pulmonary edema or pleural effusion. No pneumothorax. No worrisome pulmonary nodule or mass. Upper Abdomen: Visualized upper abdomen unremarkable. Musculoskeletal: Multiple remotely healed right-sided rib fractures noted. Irregularity present at the partially visualized distal right clavicle (series 5, image 1). Left clavicle within normal limits. No other acute osseous abnormality. No worrisome lytic or blastic osseous lesions. Review of the MIP  images confirms the above findings. IMPRESSION: 1. No CT evidence for acute pulmonary embolism. 2. No other acute cardiopulmonary abnormality identified. 3. Somewhat age  indeterminate deformity of the partially visualized distal right clavicle. While this may be chronic in nature, possible acute or subacute abnormality is not entirely excluded. Further evaluation with dedicated x-ray the right clavicle is suggested if there is clinical concern for possible acute or subacute trauma to this region. 4. Multiple remotely healed right-sided rib fractures with irregularity. Associated mild atelectasis/scarring within the adjacent right lung. Electronically Signed   By: Jeannine Boga M.D.   On: 05/26/2016 19:47   Ct Abdomen Pelvis W Contrast  Result Date: 05/26/2016 CLINICAL DATA:  Initial evaluation for acute right-sided chest and abdominal pain. EXAM: CT ABDOMEN AND PELVIS WITH CONTRAST TECHNIQUE: Multidetector CT imaging of the abdomen and pelvis was performed using the standard protocol following bolus administration of intravenous contrast. CONTRAST:  100 cc of Isovue 370. COMPARISON:  None available. FINDINGS: Lower chest: Mild atelectasis/ scar present within the bilateral lung bases. Visualized lungs are otherwise clear. No pleural or pericardial effusion. Hepatobiliary: Liver within normal limits. Gallbladder within normal limits. No biliary dilatation. Pancreas: Pancreas within normal limits. Spleen: Spleen within normal limits. Adrenals/Urinary Tract: Adrenal glands are normal. Kidneys equal in size with symmetric enhancement. No nephrolithiasis, hydronephrosis, or focal enhancing renal mass. Ureters are normal caliber without acute abnormality. Bladder within normal limits. Stomach/Bowel: Stomach within normal limits. No evidence for bowel obstruction. Appendix normal. No acute inflammatory changes about the bowels. Vascular/Lymphatic: Normal intravascular enhancement seen within the intra-abdominal aorta and its branch vessels. No adenopathy. Reproductive: Prostate normal. Other: No free air or fluid. Musculoskeletal: Multiple remotely healed right-sided rib  fractures noted. Wedging deformity of the T12 vertebral body appears chronic in nature. No worrisome lytic or blastic osseous lesions. Fixation rod partially visualize within the right femur. Chronic bilateral pars defects at L5 with associated 6 mm spondylolisthesis. IMPRESSION: 1. No CT evidence for acute intra-abdominal pelvic process. 2. Multiple remotely healed right-sided rib fractures. 3. Chronic wedging deformity of the T12 vertebral body. No acute osseous abnormality identified. 4. Chronic 6 mm spondylolisthesis of L5 on S1. Electronically Signed   By: Jeannine Boga M.D.   On: 05/26/2016 19:56    Microbiology: Recent Results (from the past 240 hour(s))  MRSA PCR Screening     Status: None   Collection Time: 05/27/16  7:00 PM  Result Value Ref Range Status   MRSA by PCR NEGATIVE NEGATIVE Final    Comment:        The GeneXpert MRSA Assay (FDA approved for NASAL specimens only), is one component of a comprehensive MRSA colonization surveillance program. It is not intended to diagnose MRSA infection nor to guide or monitor treatment for MRSA infections.      Labs: Basic Metabolic Panel:  Recent Labs Lab 05/26/16 1757 05/27/16 0411 05/28/16 0443  NA 139 137 137  K 4.0 4.5 4.3  CL 108 106 108  CO2 26 27 25   GLUCOSE 191* 121* 90  BUN 25* 23* 12  CREATININE 1.35* 1.23 0.94  CALCIUM 8.8* 8.7* 8.4*   Liver Function Tests:  Recent Labs Lab 05/26/16 1757 05/27/16 0411  AST 67* 40  ALT 35 34  ALKPHOS 102 96  BILITOT 0.7 0.7  PROT 7.6 6.8  ALBUMIN 4.1 3.7    Recent Labs Lab 05/26/16 1757  LIPASE 32   No results for input(s): AMMONIA in the last 168 hours. CBC:  Recent Labs  Lab 05/26/16 1757 05/26/16 2046 05/27/16 0118 05/27/16 0411 05/28/16 0443  WBC 8.4  --  7.9 7.8 6.8  NEUTROABS 6.2  --   --   --   --   HGB 12.1* 11.3* 12.0* 11.3* 11.1*  HCT 36.7* 33.5* 36.4* 34.3* 34.0*  MCV 86.6  --  86.5 86.2 87.2  PLT 194  --  194 188 180   Cardiac  Enzymes:  Recent Labs Lab 05/26/16 1757  TROPONINI <0.03   BNP: BNP (last 3 results) No results for input(s): BNP in the last 8760 hours.  ProBNP (last 3 results) No results for input(s): PROBNP in the last 8760 hours.  CBG:  Recent Labs Lab 05/27/16 1332 05/27/16 1630 05/27/16 2121 05/28/16 0717  GLUCAP 95 95 104* 88       Signed:  Tyler Robidoux MD.  [facility] 05/28/2016, 9:13 AM

## 2016-05-28 NOTE — Progress Notes (Signed)
REVIEWED-NO ADDITIONAL RECOMMENDATIONS.   Subjective: Denies abdominal pain, N/V. Just got breakfast tray. No overt GI bleeding.   Objective: Vital signs in last 24 hours: Temp:  [97 F (36.1 C)-99.7 F (37.6 C)] 98.4 F (36.9 C) (10/17 0718) Pulse Rate:  [43-58] 52 (10/17 0718) Resp:  [10-24] 11 (10/17 0718) BP: (89-133)/(39-74) 99/60 (10/17 0200) SpO2:  [96 %-100 %] 99 % (10/17 0718) FiO2 (%):  [32 %] 32 % (10/17 0200) Weight:  [231 lb 7.7 oz (105 kg)] 231 lb 7.7 oz (105 kg) (10/17 0500)   General:   Alert and oriented, pleasant Head:  Normocephalic and atraumatic. Abdomen:  Bowel sounds present, soft, non-tender, non-distended. Sitting up in chair.  Neurologic:  Alert and  oriented x4 Psych:  Alert and cooperative. Normal mood and affect.  Intake/Output from previous day: 10/16 0701 - 10/17 0700 In: [id number] [P.O.:480; I.V.:897] Out: 2225 [Urine:2225] Intake/Output this shift: No intake/output data recorded.  Lab Results:  Recent Labs  05/27/16 0118 05/27/16 0411 05/28/16 0443  WBC 7.9 7.8 6.8  HGB 12.0* 11.3* 11.1*  HCT 36.4* 34.3* 34.0*  PLT 194 188 180   BMET  Recent Labs  05/26/16 1757 05/27/16 0411 05/28/16 0443  NA 139 137 137  K 4.0 4.5 4.3  CL 108 106 108  CO2 26 27 25   GLUCOSE 191* 121* 90  BUN 25* 23* 12  CREATININE 1.35* 1.23 0.94  CALCIUM 8.8* 8.7* 8.4*   LFT  Recent Labs  05/26/16 1757 05/27/16 0411  PROT 7.6 6.8  ALBUMIN 4.1 3.7  AST 67* 40  ALT 35 34  ALKPHOS 102 96  BILITOT 0.7 0.7   PT/INR  Recent Labs  05/26/16 1757  LABPROT 12.3  INR 0.92   Studies/Results: Dg Chest 2 View  Result Date: 05/26/2016 CLINICAL DATA:  Chest pain and abdominal pain EXAM: CHEST  2 VIEW COMPARISON:  10/25/2015 FINDINGS: Cardiac enlargement without heart failure. Lungs are clear without infiltrate or effusion. Chronic displaced right rib fractures with mild pleural thickening unchanged from the prior study. Mild chronic compression fracture  T12 unchanged. No pleural effusion. IMPRESSION: No active cardiopulmonary disease. Electronically Signed   By: Franchot Gallo M.D.   On: 05/26/2016 19:41   Ct Angio Chest Pe W/cm &/or Wo Cm  Result Date: 05/26/2016 CLINICAL DATA:  Initial evaluation for acute right chest and abdominal pain. EXAM: CT ANGIOGRAPHY CHEST WITH CONTRAST TECHNIQUE: Multidetector CT imaging of the chest was performed using the standard protocol during bolus administration of intravenous contrast. Multiplanar CT image reconstructions and MIPs were obtained to evaluate the vascular anatomy. COMPARISON:  None. FINDINGS: Cardiovascular: Intrathoracic aorta of normal caliber and appearance without acute abnormality. Visualized great vessels intact and normal. Heart size normal. No significant pericardial fusion. Pulmonary arteries are adequately opacified for evaluation. Main pulmonary artery within normal limits for size. No filling defect to suggest acute pulmonary embolism. Re-formatted imaging confirms these findings. Mediastinum/Nodes: Visualized thyroid is normal. No enlarged mediastinal, hilar, or axillary lymph nodes identified. Esophagus is normal. Lungs/Pleura: Irregular remote right-sided rib fractures associated atelectasis/ scar. Minimal atelectasis within the peripheral left lung base. Lungs are otherwise clear. No focal infiltrates. No pulmonary edema or pleural effusion. No pneumothorax. No worrisome pulmonary nodule or mass. Upper Abdomen: Visualized upper abdomen unremarkable. Musculoskeletal: Multiple remotely healed right-sided rib fractures noted. Irregularity present at the partially visualized distal right clavicle (series 5, image 1). Left clavicle within normal limits. No other acute osseous abnormality. No worrisome lytic or blastic osseous lesions. Review  of the MIP images confirms the above findings. IMPRESSION: 1. No CT evidence for acute pulmonary embolism. 2. No other acute cardiopulmonary abnormality  identified. 3. Somewhat age indeterminate deformity of the partially visualized distal right clavicle. While this may be chronic in nature, possible acute or subacute abnormality is not entirely excluded. Further evaluation with dedicated x-ray the right clavicle is suggested if there is clinical concern for possible acute or subacute trauma to this region. 4. Multiple remotely healed right-sided rib fractures with irregularity. Associated mild atelectasis/scarring within the adjacent right lung. Electronically Signed   By: Jeannine Boga M.D.   On: 05/26/2016 19:47   Ct Abdomen Pelvis W Contrast  Result Date: 05/26/2016 CLINICAL DATA:  Initial evaluation for acute right-sided chest and abdominal pain. EXAM: CT ABDOMEN AND PELVIS WITH CONTRAST TECHNIQUE: Multidetector CT imaging of the abdomen and pelvis was performed using the standard protocol following bolus administration of intravenous contrast. CONTRAST:  100 cc of Isovue 370. COMPARISON:  None available. FINDINGS: Lower chest: Mild atelectasis/ scar present within the bilateral lung bases. Visualized lungs are otherwise clear. No pleural or pericardial effusion. Hepatobiliary: Liver within normal limits. Gallbladder within normal limits. No biliary dilatation. Pancreas: Pancreas within normal limits. Spleen: Spleen within normal limits. Adrenals/Urinary Tract: Adrenal glands are normal. Kidneys equal in size with symmetric enhancement. No nephrolithiasis, hydronephrosis, or focal enhancing renal mass. Ureters are normal caliber without acute abnormality. Bladder within normal limits. Stomach/Bowel: Stomach within normal limits. No evidence for bowel obstruction. Appendix normal. No acute inflammatory changes about the bowels. Vascular/Lymphatic: Normal intravascular enhancement seen within the intra-abdominal aorta and its branch vessels. No adenopathy. Reproductive: Prostate normal. Other: No free air or fluid. Musculoskeletal: Multiple remotely  healed right-sided rib fractures noted. Wedging deformity of the T12 vertebral body appears chronic in nature. No worrisome lytic or blastic osseous lesions. Fixation rod partially visualize within the right femur. Chronic bilateral pars defects at L5 with associated 6 mm spondylolisthesis. IMPRESSION: 1. No CT evidence for acute intra-abdominal pelvic process. 2. Multiple remotely healed right-sided rib fractures. 3. Chronic wedging deformity of the T12 vertebral body. No acute osseous abnormality identified. 4. Chronic 6 mm spondylolisthesis of L5 on S1. Electronically Signed   By: Jeannine Boga M.D.   On: 05/26/2016 19:56    Assessment: 50 year old male admitted with hematemesis in the setting of acute N/V and EGD 10/16 normal. Suspect trivial upper GI bleed. Hgb overall stable from yesterday. Clinically improved from admission without any further N/V or signs of overt GI bleeding. Appropriate for discharge home with outpatient follow-up.     Plan: Protonix once each morning, 30 minutes before breakfast  Outpatient follow-up in 4 weeks for initial screening colonoscopy Will sign off and follow as outpatient  Annitta Needs, ANP-BC [facility]     LOS: 1 day    05/28/2016, 8:07 AM

## 2016-05-28 NOTE — Telephone Encounter (Signed)
APPT MADE AND LETTER SENT  °

## 2016-05-28 NOTE — Telephone Encounter (Signed)
Please arrange for outpatient hospital follow-up in about 4 weeks. Will need to arrange screening colonoscopy at that time.

## 2016-05-30 ENCOUNTER — Encounter (HOSPITAL_COMMUNITY): Payer: Self-pay | Admitting: Internal Medicine

## 2016-06-13 ENCOUNTER — Ambulatory Visit: Payer: Self-pay | Admitting: Physical Medicine & Rehabilitation

## 2016-07-01 ENCOUNTER — Other Ambulatory Visit: Payer: Self-pay

## 2016-07-01 ENCOUNTER — Ambulatory Visit (INDEPENDENT_AMBULATORY_CARE_PROVIDER_SITE_OTHER): Payer: Self-pay | Admitting: Gastroenterology

## 2016-07-01 ENCOUNTER — Encounter: Payer: Self-pay | Admitting: Gastroenterology

## 2016-07-01 VITALS — BP 158/80 | HR 65 | Temp 98.1°F | Ht 69.0 in | Wt 228.6 lb

## 2016-07-01 DIAGNOSIS — Z1211 Encounter for screening for malignant neoplasm of colon: Secondary | ICD-10-CM | POA: Insufficient documentation

## 2016-07-01 DIAGNOSIS — R1011 Right upper quadrant pain: Secondary | ICD-10-CM

## 2016-07-01 NOTE — Progress Notes (Signed)
Referring Provider: Raiford Simmonds., PA-C Primary Care Physician:  Raiford Simmonds., PA-C  Primary GI: Dr. [name]   Chief Complaint  Patient presents with  . Colonoscopy    ?set-up, was in hospital for vomiting blood (better now)  . Abdominal Pain    RUQ (tree fell on him 01/2015)    HPI:   Jeremy Burgess is a 50 y.o. male presenting today with a history of hematemesis, hospitalized in October. Hematemesis occurred during episode of acute N/V, and EGD performed during admission was completely normal. Returns today to arrange outpatient screening colonoscopy. Notes chronic RUQ pain, stating a tree fell on him in June of last year. CT in October with multiple remotely healed right-sided rib fractures. Recent LFTs normal.   RUQ pain is constant. Makes a gurgling noise and shoots a pain throughout entire front of abdomen. Will have associated nausea, gets to shaking real bad. Eating does not worsen pain. Denies any constipation. No rectal bleeding. No further hematemesis.   Past Medical History:  Diagnosis Date  . Accidentally struck by falling tree 02/08/2015  . Acute blood loss anemia 02/09/2015  . Acute respiratory failure ([place]) 02/09/2015  . Chronic pain due to trauma   . Diabetes mellitus   . Diabetes mellitus ([place])   . History of blood clots    DVT  . Hypertension   . Pneumothorax   . Postlaminectomy syndrome, lumbar region 11/13/2015  . Rib fracture   . Smoker 0   denies smoking  . Thrombocytopenia ([place]) 02/09/2015  . Traumatic closed displaced fracture of multiple ribs of right side 02/08/2015  . Traumatic hemopneumothorax 02/09/2015  . Upper GI bleeding 05/27/2016   Trivial. EGD unremarkable.    Past Surgical History:  Procedure Laterality Date  . BACK SURGERY    . ESOPHAGOGASTRODUODENOSCOPY  2009   EGD completed by Dr. [name] due to hematemesis. Findings of normal esophagus, tiny antral erosions, normal D1, D2  . ESOPHAGOGASTRODUODENOSCOPY N/A 05/27/2016   Dr. [name]:  normal   . EXTERNAL FIXATION LEG Right 02/09/2015   Procedure: EXTERNAL FIXATION LEG;  Surgeon: [name], MD;  Location: [facility];  Service: Orthopedics;  Laterality: Right;  . EXTERNAL FIXATION REMOVAL Right 02/14/2015   Procedure: REMOVAL EXTERNAL FIXATION LEG;  Surgeon: [name], MD;  Location: [facility];  Service: Orthopedics;  Laterality: Right;  . FEMUR IM NAIL Right 02/09/2015   Procedure: INTRAMEDULLARY (IM) RETROGRADE FEMORAL NAILING;  Surgeon: [name], MD;  Location: [facility];  Service: Orthopedics;  Laterality: Right;  . NOSE SURGERY    . ORIF ANKLE FRACTURE Right 02/14/2015   Procedure: OPEN REDUCTION INTERNAL FIXATION (ORIF) PILON FRACTURE;  Surgeon: [name], MD;  Location: [facility];  Service: Orthopedics;  Laterality: Right;  . ORIF ULNAR FRACTURE Right 02/09/2015   Procedure: OPEN REDUCTION INTERNAL FIXATION (ORIF) ULNAR FRACTURE;  Surgeon: [name], MD;  Location: [facility];  Service: Orthopedics;  Laterality: Right;    Current Outpatient Prescriptions  Medication Sig Dispense Refill  . insulin detemir (LEVEMIR) 100 UNIT/ML injection Inject 0.6 mLs (60 Units total) into the skin at bedtime.    [name] lisinopril (PRINIVIL,ZESTRIL) 20 MG tablet Take 20 mg by mouth daily. Takes with the 20/25 combo    . sitaGLIPtin-metformin (JANUMET) 50-1000 MG tablet Take 1 tablet by mouth 2 (two) times daily with a meal.     No current facility-administered medications for this visit.     Allergies as of 07/01/2016 - Review  Complete 07/01/2016  Allergen Reaction Noted  . Bee venom  07/22/2011  . Bee venom Other (See Comments) 02/08/2015    Family History  Problem Relation Age of Onset  . Diabetes Mother   . COPD Mother   . Colon cancer Neg Hx     Social History   Social History  . Marital status: Single    Spouse name: N/A  . Number of children: N/A  . Years of education: N/A   Social History Main Topics  . Smoking status: Never Smoker  . Smokeless tobacco:  Never Used  . Alcohol use No     Comment: history of alcohol abuse but quit in 2008   . Drug use: No  . Sexual activity: Not Asked   Other Topics Concern  . None   Social History Narrative   ** Merged History Encounter **        Review of Systems: Gen: Denies fever, chills, anorexia. Denies fatigue, weakness, weight loss.  CV: Denies chest pain, palpitations, syncope, peripheral edema, and claudication. Resp: Denies dyspnea at rest, cough, wheezing, coughing up blood, and pleurisy. GI: see HPI  Derm: Denies rash, itching, dry skin Psych: Denies depression, anxiety, memory loss, confusion. No homicidal or suicidal ideation.  Heme: Denies bruising, bleeding, and enlarged lymph nodes.  Physical Exam: BP (!) 158/80   Pulse 65   Temp 98.1 F (36.7 C) (Oral)   Ht 5\' 9"  (1.753 m)   Wt 228 lb 9.6 oz (103.7 kg)   BMI 33.76 kg/m  General:   Alert and oriented. No distress noted. Pleasant and cooperative.  Head:  Normocephalic and atraumatic. Eyes:  Conjuctiva clear without scleral icterus. Mouth:  Oral mucosa pink and moist. Good dentition. No lesions. Heart:  S1, S2 present without murmurs, rubs, or gallops. Regular rate and rhythm. Abdomen:  +BS, soft, very mild TTP right-sided abdomen and non-distended. No rebound or guarding. No HSM or masses noted. Msk:  Symmetrical without gross deformities. Normal posture. Extremities:  Without edema. Neurologic:  Alert and  oriented x4;  grossly normal neurologically. Psych:  Alert and cooperative. Normal mood and affect.  Lab Results  Component Value Date   ALT 34 05/27/2016   AST 40 05/27/2016   ALKPHOS 96 05/27/2016   BILITOT 0.7 05/27/2016

## 2016-07-01 NOTE — Patient Instructions (Addendum)
We have scheduled you for a colonoscopy with Dr. [name] in the near future. Don't take any oral diabetes medication the day of the procedure. Take 1/2 dose of insulin the evening before (which would be 30 units).   We have ordered an ultrasound of your gallbladder.

## 2016-07-01 NOTE — Progress Notes (Signed)
CC'ED TO PCP 

## 2016-07-01 NOTE — Assessment & Plan Note (Signed)
Chronic since last year after tree fell on patient. Normal EGD on file. Constant pain noted, at times associated nausea. Will be seeing pain management in near future. CT unrevealing for acute findings but does note healed right-sided rib fractures. Gallbladder remains in situ. Doubt biliary etiology but will pursue ultrasound of abdomen to wrap up GI evaluation.

## 2016-07-01 NOTE — Assessment & Plan Note (Signed)
50 year old male with need for initial screening colonoscopy, no family history of colorectal cancer or polyps. No concerning lower GI symptoms.   Proceed with TCS with Dr. [name] in near future: the risks, benefits, and alternatives have been discussed with the patient in detail. The patient states understanding and desires to proceed. Phenergan 25 mg IV on call

## 2016-07-08 ENCOUNTER — Ambulatory Visit (HOSPITAL_BASED_OUTPATIENT_CLINIC_OR_DEPARTMENT_OTHER): Payer: Self-pay | Admitting: Physical Medicine & Rehabilitation

## 2016-07-08 ENCOUNTER — Encounter: Payer: Self-pay | Attending: Physical Medicine & Rehabilitation

## 2016-07-08 ENCOUNTER — Encounter: Payer: Self-pay | Admitting: Physical Medicine & Rehabilitation

## 2016-07-08 VITALS — BP 185/81 | HR 60 | Resp 14

## 2016-07-08 DIAGNOSIS — M5030 Other cervical disc degeneration, unspecified cervical region: Secondary | ICD-10-CM | POA: Insufficient documentation

## 2016-07-08 DIAGNOSIS — M4316 Spondylolisthesis, lumbar region: Secondary | ICD-10-CM | POA: Insufficient documentation

## 2016-07-08 DIAGNOSIS — Z79899 Other long term (current) drug therapy: Secondary | ICD-10-CM

## 2016-07-08 DIAGNOSIS — G8929 Other chronic pain: Secondary | ICD-10-CM | POA: Insufficient documentation

## 2016-07-08 DIAGNOSIS — G8921 Chronic pain due to trauma: Secondary | ICD-10-CM

## 2016-07-08 DIAGNOSIS — Z9889 Other specified postprocedural states: Secondary | ICD-10-CM | POA: Insufficient documentation

## 2016-07-08 DIAGNOSIS — M24551 Contracture, right hip: Secondary | ICD-10-CM

## 2016-07-08 DIAGNOSIS — R0781 Pleurodynia: Secondary | ICD-10-CM | POA: Insufficient documentation

## 2016-07-08 DIAGNOSIS — Z5181 Encounter for therapeutic drug level monitoring: Secondary | ICD-10-CM

## 2016-07-08 NOTE — Progress Notes (Signed)
Subjective:    Patient ID: Jeremy Burgess, male    DOB: June 08, 1966, 50 y.o.   MRN: [id number]  HPI  CC Right rib pain Undergoing GI evaluation for right upper quadrant pain. CT the abdomen was unremarkable, did show the previously healed rib fractures on the right side. EGD was unremarkable. Scheduled for abdominal ultrasound. I have reviewed the gastroenterology note. Thus far, no gastro-intestinal abnormalities to explain right upper quadrant discomfort. CT abd reviewed 3mm L5-S1 spondylolisthesis, left LE numbness Degenerative changes in cervical spine Has tried epidural injection in past which was not helpful Prior spine surgery with CSF leak no further nurosurgical intervention was recommend  Right hip with chronic pain- no recent xrays  Pain Inventory Average Pain 9 Pain Right Now 9 My pain is sharp and aching  In the last 24 hours, has pain interfered with the following? General activity 10 Relation with others 7 Enjoyment of life 10 What TIME of day is your pain at its worst? daytime Sleep (in general) NA  Pain is worse with: walking, sitting and standing Pain improves with: rest and medication Relief from Meds: 5  Mobility use a cane ability to climb steps?  no do you drive?  no  Function I need assistance with the following:  household duties and shopping  Neuro/Psych weakness numbness tingling trouble walking spasms depression anxiety  Prior Studies Any changes since last visit?  no  Physicians involved in your care Any changes since last visit?  no   Family History  Problem Relation Age of Onset  . Diabetes Mother   . COPD Mother   . Colon cancer Neg Hx    Social History   Social History  . Marital status: Single    Spouse name: N/A  . Number of children: N/A  . Years of education: N/A   Social History Main Topics  . Smoking status: Never Smoker  . Smokeless tobacco: Never Used  . Alcohol use No     Comment: history of alcohol  abuse but quit in 2008   . Drug use: No  . Sexual activity: Not Asked   Other Topics Concern  . None   Social History Narrative   ** Merged History Encounter **       Past Surgical History:  Procedure Laterality Date  . BACK SURGERY    . ESOPHAGOGASTRODUODENOSCOPY  2009   EGD completed by Dr. [name] due to hematemesis. Findings of normal esophagus, tiny antral erosions, normal D1, D2  . ESOPHAGOGASTRODUODENOSCOPY N/A 05/27/2016   Dr. [name]: normal   . EXTERNAL FIXATION LEG Right 02/09/2015   Procedure: EXTERNAL FIXATION LEG;  Surgeon: [name], MD;  Location: [facility];  Service: Orthopedics;  Laterality: Right;  . EXTERNAL FIXATION REMOVAL Right 02/14/2015   Procedure: REMOVAL EXTERNAL FIXATION LEG;  Surgeon: [name], MD;  Location: [facility];  Service: Orthopedics;  Laterality: Right;  . FEMUR IM NAIL Right 02/09/2015   Procedure: INTRAMEDULLARY (IM) RETROGRADE FEMORAL NAILING;  Surgeon: [name], MD;  Location: [facility];  Service: Orthopedics;  Laterality: Right;  . NOSE SURGERY    . ORIF ANKLE FRACTURE Right 02/14/2015   Procedure: OPEN REDUCTION INTERNAL FIXATION (ORIF) PILON FRACTURE;  Surgeon: [name], MD;  Location: [facility];  Service: Orthopedics;  Laterality: Right;  . ORIF ULNAR FRACTURE Right 02/09/2015   Procedure: OPEN REDUCTION INTERNAL FIXATION (ORIF) ULNAR FRACTURE;  Surgeon: [name], MD;  Location: [facility];  Service: Orthopedics;  Laterality:  Right;   Past Medical History:  Diagnosis Date  . Accidentally struck by falling tree 02/08/2015  . Acute blood loss anemia 02/09/2015  . Acute respiratory failure ([place]) 02/09/2015  . Chronic pain due to trauma   . Diabetes mellitus   . Diabetes mellitus ([place])   . History of blood clots    DVT  . Hypertension   . Pneumothorax   . Postlaminectomy syndrome, lumbar region 11/13/2015  . Rib fracture   . Smoker 0   denies smoking  . Thrombocytopenia ([place]) 02/09/2015  . Traumatic closed displaced fracture  of multiple ribs of right side 02/08/2015  . Traumatic hemopneumothorax 02/09/2015  . Upper GI bleeding 05/27/2016   Trivial. EGD unremarkable.   BP (!) 185/81   Pulse 60   Resp 14   SpO2 98%   Opioid Risk Score:   Fall Risk Score:  `1  Depression screen PHQ 2/9  Depression screen [facility] 2/9 12/11/2015 11/13/2015  Decreased Interest 3 3  Down, Depressed, Hopeless 3 3  PHQ - 2 Score 6 6  Altered sleeping - 3  Tired, decreased energy - 3  Change in appetite - 3  Feeling bad or failure about yourself  - 3  Trouble concentrating - 3  Moving slowly or fidgety/restless - 3  Suicidal thoughts - 0  PHQ-9 Score - 24    Review of Systems  Constitutional: Positive for diaphoresis.  HENT: Negative.   Respiratory: Positive for shortness of breath.   Cardiovascular: Positive for leg swelling.  Gastrointestinal: Positive for abdominal pain, nausea and vomiting.  Endocrine:       High blood sugar  Genitourinary: Negative.   Musculoskeletal: Negative.   Allergic/Immunologic: Negative.   Neurological: Negative.   Hematological: Bruises/bleeds easily.  Psychiatric/Behavioral: Negative.   All other systems reviewed and are negative.      Objective:   Physical Exam  Constitutional: He is oriented to person, place, and time. He appears well-developed and well-nourished.  HENT:  Head: Normocephalic and atraumatic.  Eyes: Conjunctivae and EOM are normal. Pupils are equal, round, and reactive to light.  Abdominal: Soft. Bowel sounds are normal. He exhibits no distension. There is no tenderness. There is no rebound and no guarding.  Musculoskeletal:       Right hip: He exhibits decreased range of motion.       Left hip: He exhibits normal range of motion.  Neurological: He is alert and oriented to person, place, and time.  Psychiatric: He has a normal mood and affect.  Nursing note and vitals reviewed. Tenderness. Palpation along the right eleventh rib.  Negative straight leg  raising Absent right hip internal, external rotation Left hip has normal internal and external rotation.  Ambulates with forward flexed posture      Assessment & Plan:  1. Chronic pain post trauma. Functioning at independent level. Has completed inpatient rehabilitation up proximally one year ago  His main Pain complaint is right chronic rib pain. Agree with GI that this is likely not a primary gastrointestinal pathology.  2. Chronic low back pain with lumbar postlaminectomy syndrome, lumbar spondylolisthesis with intermittent radicular symptoms. Was told that he is no longer an operative candidate. Has not responded well to epidural steroid injections in the past. May potentially benefit from gabapentin. However, this is not a very consistent discomfort  3. Right hip contracture. This is causing some gait deviation, I suspect underlying severe osteoarthritis of the hip, but we'll need to get some x-rays. He may benefit from  intra-articular injection of the right hip under fluoroscopy guidance. If no response to intra-articular injection and x-rays demonstrate severe joint space loss, would recommend referral to orthopedic surgery

## 2016-07-08 NOTE — Patient Instructions (Addendum)
Hip pain, may need to do xray guided injection  We can review results of urine test and call in medication as needed

## 2016-07-11 ENCOUNTER — Ambulatory Visit (HOSPITAL_COMMUNITY)
Admission: RE | Admit: 2016-07-11 | Discharge: 2016-07-11 | Disposition: A | Discharge status: Home / Self Care | Payer: Self-pay | Source: Ambulatory Visit | Attending: Gastroenterology | Admitting: Gastroenterology

## 2016-07-11 DIAGNOSIS — R1011 Right upper quadrant pain: Secondary | ICD-10-CM | POA: Insufficient documentation

## 2016-07-11 DIAGNOSIS — K8020 Calculus of gallbladder without cholecystitis without obstruction: Secondary | ICD-10-CM | POA: Insufficient documentation

## 2016-07-11 NOTE — Progress Notes (Signed)
Gallstones noted on ultrasound. May proceed with HIDA with ENSURE to evaluate further if patient is agreeable.

## 2016-07-12 LAB — TOXASSURE SELECT,+ANTIDEPR,UR

## 2016-07-16 ENCOUNTER — Ambulatory Visit (HOSPITAL_COMMUNITY)
Admission: RE | Admit: 2016-07-16 | Discharge: 2016-07-16 | Disposition: A | Discharge status: Home / Self Care | Payer: Self-pay | Source: Ambulatory Visit | Attending: Physical Medicine & Rehabilitation | Admitting: Physical Medicine & Rehabilitation

## 2016-07-16 DIAGNOSIS — M24551 Contracture, right hip: Secondary | ICD-10-CM | POA: Insufficient documentation

## 2016-07-17 ENCOUNTER — Other Ambulatory Visit: Payer: Self-pay

## 2016-07-17 DIAGNOSIS — R1011 Right upper quadrant pain: Secondary | ICD-10-CM

## 2016-07-19 ENCOUNTER — Encounter (HOSPITAL_COMMUNITY): Payer: Self-pay

## 2016-07-19 ENCOUNTER — Encounter: Payer: Self-pay | Attending: Physical Medicine & Rehabilitation

## 2016-07-19 ENCOUNTER — Ambulatory Visit (HOSPITAL_BASED_OUTPATIENT_CLINIC_OR_DEPARTMENT_OTHER): Payer: Self-pay | Admitting: Physical Medicine & Rehabilitation

## 2016-07-19 ENCOUNTER — Encounter: Payer: Self-pay | Admitting: Physical Medicine & Rehabilitation

## 2016-07-19 VITALS — BP 178/82 | HR 67 | Temp 98.6°F

## 2016-07-19 DIAGNOSIS — R0781 Pleurodynia: Secondary | ICD-10-CM | POA: Insufficient documentation

## 2016-07-19 DIAGNOSIS — M1651 Unilateral post-traumatic osteoarthritis, right hip: Secondary | ICD-10-CM

## 2016-07-19 DIAGNOSIS — M4316 Spondylolisthesis, lumbar region: Secondary | ICD-10-CM | POA: Insufficient documentation

## 2016-07-19 DIAGNOSIS — Z9889 Other specified postprocedural states: Secondary | ICD-10-CM | POA: Insufficient documentation

## 2016-07-19 DIAGNOSIS — M5030 Other cervical disc degeneration, unspecified cervical region: Secondary | ICD-10-CM | POA: Insufficient documentation

## 2016-07-19 DIAGNOSIS — G8929 Other chronic pain: Secondary | ICD-10-CM | POA: Insufficient documentation

## 2016-07-19 NOTE — Progress Notes (Signed)
  [facility]   Name: HEATHE JAKOBSEN DOB:03/05/1966 MRN: [id number]  Date:07/19/2016  Physician: Alysia Penna, MD    Nurse/CMA: Wessling, CMA  Allergies:  Allergies  Allergen Reactions  . Bee Venom   . Bee Venom Other (See Comments)    intolerance    Consent Signed: Yes.    Is patient diabetic? Yes.    CBG today? no  Pregnant: No. LMP: No LMP for male patient. (age 51-55)  Anticoagulants: no Anti-inflammatory: no Antibiotics: no  Procedure: Right Hip injection under Fluoro  Position: Left Lateral Start Time: 11:33am      End Time: 11:37am        Fluoro Time: 16  RN/CMA Anajah Sterbenz CMA Wessling CMA    Time 1110am 11:40am    BP 178/82 181/87    Pulse 67 67    Respirations 16 16    O2 Sat 97 97    S/S 6 6    Pain Level 10/10 9/10     D/C home with [name] patient A & O X 3, D/C instructions reviewed, and sits independently.

## 2016-07-19 NOTE — Patient Instructions (Signed)
May use ice to the area for the next 24-48 hours. If you have soreness related to the injection. Please call if the area turns red or starts draining fluid.

## 2016-07-19 NOTE — Progress Notes (Signed)
Fluoroscopic guided hip injection  Right side  Indication right hip pain and contracture, which has not responded to oral medication and interferes with self-care and mobility.  Patient placed in the supine position, right inguinal area, marked and prepped with Betadine. Right hip joint was visualized, the intertrochanteric line was used as entry point with needle tip targeting the junction of the femoral head and the femoral neck. Needle tract was anesthetized using a 25-gauge 1.5 inch needle, 1% lidocaine injected times 5 cc, followed by insertion of a 22-gauge 3.5 inch spinal needle. Under  fluoroscopic visualization with needle advancing and fluoroscopy images used after each needle movement, needle tip reached target area. Bone contact was made. Isovue-200 times 2 mL's demonstrated capsular uptake. Then a solution containing 1 cc of 6 cc Celestone and 4 cc of 2% lidocaine were injected. Patient tolerated procedure well

## 2016-07-22 ENCOUNTER — Encounter (HOSPITAL_COMMUNITY): Payer: Self-pay

## 2016-07-22 ENCOUNTER — Encounter (HOSPITAL_COMMUNITY)
Admission: RE | Admit: 2016-07-22 | Discharge: 2016-07-22 | Disposition: A | Discharge status: Home / Self Care | Payer: Self-pay | Source: Ambulatory Visit | Attending: Gastroenterology | Admitting: Gastroenterology

## 2016-07-22 DIAGNOSIS — R1011 Right upper quadrant pain: Secondary | ICD-10-CM | POA: Insufficient documentation

## 2016-07-22 MED ORDER — TECHNETIUM TC 99M MEBROFENIN IV KIT
5.0000 | PACK | Freq: Once | INTRAVENOUS | Status: AC | PRN
  Administered 2016-07-22: 5 via INTRAVENOUS

## 2016-07-23 ENCOUNTER — Telehealth: Payer: Self-pay | Admitting: *Deleted

## 2016-07-23 NOTE — Telephone Encounter (Signed)
Jeremy Burgess was under the impression that Dr Letta Pate was going to give him a prescription at the last visit but he did not receive anything.  I did nto see anything in your note.Please advise.

## 2016-07-23 NOTE — Telephone Encounter (Signed)
May call in tylenol #3 with codeine one tablet BID, #60 2 refills

## 2016-07-24 MED ORDER — ACETAMINOPHEN-CODEINE #3 300-30 MG PO TABS: 1.0000 | ORAL_TABLET | Freq: Two times a day (BID) | ORAL | 2 refills | Status: DC

## 2016-07-24 NOTE — Telephone Encounter (Signed)
Called to [facility] and Mr Shingler notified.

## 2016-07-25 ENCOUNTER — Encounter (HOSPITAL_COMMUNITY): Payer: Self-pay | Admitting: *Deleted

## 2016-07-25 ENCOUNTER — Encounter (HOSPITAL_COMMUNITY)
Admission: RE | Disposition: A | Discharge status: Home / Self Care | Payer: Self-pay | Source: Ambulatory Visit | Attending: Internal Medicine

## 2016-07-25 ENCOUNTER — Ambulatory Visit (HOSPITAL_COMMUNITY)
Admission: RE | Admit: 2016-07-25 | Discharge: 2016-07-25 | Disposition: A | Discharge status: Home / Self Care | Payer: Self-pay | Source: Ambulatory Visit | Attending: Internal Medicine | Admitting: Internal Medicine

## 2016-07-25 DIAGNOSIS — Z1212 Encounter for screening for malignant neoplasm of rectum: Secondary | ICD-10-CM

## 2016-07-25 DIAGNOSIS — D122 Benign neoplasm of ascending colon: Secondary | ICD-10-CM

## 2016-07-25 DIAGNOSIS — Z1211 Encounter for screening for malignant neoplasm of colon: Secondary | ICD-10-CM

## 2016-07-25 HISTORY — PX: COLONOSCOPY: SHX5424

## 2016-07-25 HISTORY — PX: POLYPECTOMY: SHX5525

## 2016-07-25 LAB — GLUCOSE, CAPILLARY: GLUCOSE-CAPILLARY: 94 mg/dL (ref 65–99)

## 2016-07-25 SURGERY — COLONOSCOPY
Anesthesia: Moderate Sedation

## 2016-07-25 MED ORDER — MIDAZOLAM HCL 5 MG/5ML IJ SOLN
INTRAMUSCULAR | Status: AC
  Filled 2016-07-25: qty 10

## 2016-07-25 MED ORDER — MEPERIDINE HCL 100 MG/ML IJ SOLN
INTRAMUSCULAR | Status: AC
  Filled 2016-07-25: qty 2

## 2016-07-25 MED ORDER — MEPERIDINE HCL 100 MG/ML IJ SOLN
INTRAMUSCULAR | Status: DC | PRN
  Administered 2016-07-25 (×2): 50 mg via INTRAVENOUS
  Administered 2016-07-25: 25 mg via INTRAVENOUS

## 2016-07-25 MED ORDER — STERILE WATER FOR IRRIGATION IR SOLN
Status: DC | PRN
  Administered 2016-07-25: 14:00:00

## 2016-07-25 MED ORDER — PROMETHAZINE HCL 25 MG/ML IJ SOLN: 25.0000 mg | Freq: Once | INTRAMUSCULAR | Status: DC

## 2016-07-25 MED ORDER — SODIUM CHLORIDE 0.9 % IV SOLN
INTRAVENOUS | Status: DC
Start: 2016-07-25 — End: 2016-07-25
  Administered 2016-07-25: 1000 mL via INTRAVENOUS

## 2016-07-25 MED ORDER — ONDANSETRON HCL 4 MG/2ML IJ SOLN
INTRAMUSCULAR | Status: DC | PRN
  Administered 2016-07-25: 4 mg via INTRAVENOUS

## 2016-07-25 MED ORDER — ONDANSETRON HCL 4 MG/2ML IJ SOLN
INTRAMUSCULAR | Status: AC
  Filled 2016-07-25: qty 2

## 2016-07-25 MED ORDER — MIDAZOLAM HCL 5 MG/5ML IJ SOLN
INTRAMUSCULAR | Status: DC | PRN
  Administered 2016-07-25 (×2): 2 mg via INTRAVENOUS
  Administered 2016-07-25: 1 mg via INTRAVENOUS

## 2016-07-25 NOTE — Discharge Instructions (Addendum)
Colonoscopy Discharge Instructions  Read the instructions outlined below and refer to this sheet in the next few weeks. These discharge instructions provide you with general information on caring for yourself after you leave the hospital. Your doctor may also give you specific instructions. While your treatment has been planned according to the most current medical practices available, unavoidable complications occasionally occur. If you have any problems or questions after discharge, call Dr. [name] at [phone]. ACTIVITY  You may resume your regular activity, but move at a slower pace for the next 24 hours.   Take frequent rest periods for the next 24 hours.   Walking will help get rid of the air and reduce the bloated feeling in your belly (abdomen).   No driving for 24 hours (because of the medicine (anesthesia) used during the test).    Do not sign any important legal documents or operate any machinery for 24 hours (because of the anesthesia used during the test).  NUTRITION  Drink plenty of fluids.   You may resume your normal diet as instructed by your doctor.   Begin with a light meal and progress to your normal diet. Heavy or fried foods are harder to digest and may make you feel sick to your stomach (nauseated).   Avoid alcoholic beverages for 24 hours or as instructed.  MEDICATIONS  You may resume your normal medications unless your doctor tells you otherwise.  WHAT YOU CAN EXPECT TODAY  Some feelings of bloating in the abdomen.   Passage of more gas than usual.   Spotting of blood in your stool or on the toilet paper.  IF YOU HAD POLYPS REMOVED DURING THE COLONOSCOPY:  No aspirin products for 7 days or as instructed.   No alcohol for 7 days or as instructed.   Eat a soft diet for the next 24 hours.  FINDING OUT THE RESULTS OF YOUR TEST Not all test results are available during your visit. If your test results are not back during the visit, make an appointment  with your caregiver to find out the results. Do not assume everything is normal if you have not heard from your caregiver or the medical facility. It is important for you to follow up on all of your test results.  SEEK IMMEDIATE MEDICAL ATTENTION IF:  You have more than a spotting of blood in your stool.   Your belly is swollen (abdominal distention).   You are nauseated or vomiting.   You have a temperature over 101.   You have abdominal pain or discomfort that is severe or gets worse throughout the day.    Colon polyp information provided  Further recommendations to follow pending review of pathology report   Colon Polyps Introduction Polyps are tissue growths inside the body. Polyps can grow in many places, including the large intestine (colon). A polyp may be a round bump or a mushroom-shaped growth. You could have one polyp or several. Most colon polyps are noncancerous (benign). However, some colon polyps can become cancerous over time. What are the causes? The exact cause of colon polyps is not known. What increases the risk? This condition is more likely to develop in people who:  Have a family history of colon cancer or colon polyps.  Are older than 74 or older than 45 if they are African American.  Have inflammatory bowel disease, such as ulcerative colitis or Crohn disease.  Are overweight.  Smoke cigarettes.  Do not get enough exercise.  Drink too much  alcohol.  Eat a diet that is:  High in fat and red meat.  Low in fiber.  Had childhood cancer that was treated with abdominal radiation. What are the signs or symptoms? Most polyps do not cause symptoms. If you have symptoms, they may include:  Blood coming from your rectum when having a bowel movement.  Blood in your stool.The stool may look dark red or black.  A change in bowel habits, such as constipation or diarrhea. How is this diagnosed? This condition is diagnosed with a colonoscopy. This is  a procedure that uses a lighted, flexible scope to look at the inside of your colon. How is this treated? Treatment for this condition involves removing any polyps that are found. Those polyps will then be tested for cancer. If cancer is found, your health care provider will talk to you about options for colon cancer treatment. Follow these instructions at home: Diet  Eat plenty of fiber, such as fruits, vegetables, and whole grains.  Eat foods that are high in calcium and vitamin D, such as milk, cheese, yogurt, eggs, liver, fish, and broccoli.  Limit foods high in fat, red meats, and processed meats, such as hot dogs, sausage, bacon, and lunch meats.  Maintain a healthy weight, or lose weight if recommended by your health care provider. General instructions  Do not smoke cigarettes.  Do not drink alcohol excessively.  Keep all follow-up visits as told by your health care provider. This is important. This includes keeping regularly scheduled colonoscopies. Talk to your health care provider about when you need a colonoscopy.  Exercise every day or as told by your health care provider. Contact a health care provider if:  You have new or worsening bleeding during a bowel movement.  You have new or increased blood in your stool.  You have a change in bowel habits.  You unexpectedly lose weight. This information is not intended to replace advice given to you by your health care provider. Make sure you discuss any questions you have with your health care provider. Document Released: 04/24/2004 Document Revised: 01/04/2016 Document Reviewed: 06/19/2015  2017 Elsevier

## 2016-07-25 NOTE — Interval H&P Note (Signed)
History and Physical Interval Note:  07/25/2016 1:29 PM  Jeremy Burgess  has presented today for surgery, with the diagnosis of SCREENING  The various methods of treatment have been discussed with the patient and family. After consideration of risks, benefits and other options for treatment, the patient has consented to  Procedure(s) with comments: COLONOSCOPY (N/A) - 1215  as a surgical intervention .  The patient's history has been reviewed, patient examined, no change in status, stable for surgery.  I have reviewed the patient's chart and labs.  Questions were answered to the patient's satisfaction.     No change. Screening colonoscopy per plan. The risks, benefits, limitations, alternatives and imponderables have been reviewed with the patient. Questions have been answered. All parties are agreeable.      Manus Rudd

## 2016-07-25 NOTE — Progress Notes (Signed)
HIDA scan normal. I have sent a message in [place]. Needs follow-up after procedure in 6-8 weeks.

## 2016-07-25 NOTE — H&P (View-Only) (Signed)
HIDA scan normal. I have sent a message in [place]. Needs follow-up after procedure in 6-8 weeks.

## 2016-07-25 NOTE — Op Note (Signed)
[facility] ([facility]) Patient Name: Jeremy Burgess Procedure Date: 07/25/2016 1:31 PM MRN: [id number] Date of Birth: 1965/11/06 Attending MD: Norvel Richards , MD CSN: [id number] Age: 50 Admit Type: Outpatient Procedure:                Colonoscopy with snare polypectomy Indications:              Screening for colorectal malignant neoplasm Providers:                Norvel Richards, MD, Jeanann Lewandowsky. Sharon Seller, RN,                            Randa Spike, Technician Referring MD:              Medicines:                Midazolam 5 mg IV, Meperidine 0000000 mg IV Complications:            No immediate complications. Estimated Blood Loss:     Estimated blood loss: none. Procedure:                Pre-Anesthesia Assessment:                           - Prior to the procedure, a History and Physical                            was performed, and patient medications and                            allergies were reviewed. The patient's tolerance of                            previous anesthesia was also reviewed. The risks                            and benefits of the procedure and the sedation                            options and risks were discussed with the patient.                            All questions were answered, and informed consent                            was obtained. Prior Anticoagulants: The patient has                            taken no previous anticoagulant or antiplatelet                            agents. ASA Grade Assessment: II - A patient with                            mild systemic disease. After reviewing the risks  and benefits, the patient was deemed in                            satisfactory condition to undergo the procedure.                           After obtaining informed consent, the colonoscope                            was passed under direct vision. Throughout the                            procedure, the patient's blood  pressure, pulse, and                            oxygen saturations were monitored continuously. The                            EC-[id number] scope was introduced through                            the anus and advanced to the the cecum, identified                            by appendiceal orifice and ileocecal valve. The                            ileocecal valve, appendiceal orifice, and rectum                            were photographed. The quality of the bowel                            preparation was adequate. Scope In: 1:42:28 PM Scope Out: 1:58:08 PM Scope Withdrawal Time: 0 hours 11 minutes 19 seconds  Total Procedure Duration: 0 hours 15 minutes 40 seconds  Findings:      The perianal and digital rectal examinations were normal.      A 9 mm polyp was found in the ascending colon. The polyp was       semi-pedunculated. The polyp was removed with a hot snare. Resection and       retrieval were complete. Estimated blood loss: none.      The entire examined colon appeared normal on direct and retroflexion       views. Impression:               - One 9 mm polyp in the ascending colon, removed                            with a hot snare. Resected and retrieved.                           - The entire examined colon is normal on direct and  retroflexion views.                           - The examination was otherwise normal on direct                            and retroflexion views. Moderate Sedation:      Moderate (conscious) sedation was administered by the endoscopy nurse       and supervised by the endoscopist. The following parameters were       monitored: oxygen saturation, heart rate, blood pressure, respiratory       rate, EKG, adequacy of pulmonary ventilation, and response to care.       Total physician intraservice time was 24 minutes. Recommendation:           - Written discharge instructions were provided to                             the patient.                           - The signs and symptoms of potential delayed                            complications were discussed with the patient.                           - Patient has a contact number available for                            emergencies.                           - Return to normal activities tomorrow.                           - Resume previous diet.                           - Continue present medications.                           - Repeat colonoscopy date to be determined after                            pending pathology results are reviewed for                            surveillance.                           - Return to GI office (date not yet determined). Procedure Code(s):        --- Professional ---                           [phone], Colonoscopy, flexible; with removal of  tumor(s), polyp(s), or other lesion(s) by snare                            technique                           99152, Moderate sedation services provided by the                            same physician or other qualified health care                            professional performing the diagnostic or                            therapeutic service that the sedation supports,                            requiring the presence of an independent trained                            observer to assist in the monitoring of the                            patient's level of consciousness and physiological                            status; initial 15 minutes of intraservice time,                            patient age 7 years or older                           [phone], Moderate sedation services; each additional                            15 minutes intraservice time Diagnosis Code(s):        --- Professional ---                           Z12.11, Encounter for screening for malignant                            neoplasm of colon                           D12.2,  Benign neoplasm of ascending colon CPT copyright 2016 [employer]. All rights reserved. The codes documented in this report are preliminary and upon coder review may  be revised to meet current compliance requirements. Cristopher Estimable. Tymeir Weathington, MD Norvel Richards, MD 07/25/2016 2:06:40 PM This report has been signed electronically. Number of Addenda: 0

## 2016-07-29 ENCOUNTER — Encounter: Payer: Self-pay | Admitting: Internal Medicine

## 2016-07-29 NOTE — Progress Notes (Signed)
APPT MADE AND LETTER SENT  °

## 2016-07-30 ENCOUNTER — Encounter: Payer: Self-pay | Admitting: Internal Medicine

## 2016-07-30 ENCOUNTER — Encounter (HOSPITAL_COMMUNITY): Payer: Self-pay | Admitting: Internal Medicine

## 2016-09-24 ENCOUNTER — Other Ambulatory Visit: Payer: Self-pay

## 2016-09-24 ENCOUNTER — Encounter: Payer: Self-pay | Admitting: Internal Medicine

## 2016-09-24 ENCOUNTER — Encounter: Payer: Self-pay | Admitting: Gastroenterology

## 2016-09-24 ENCOUNTER — Ambulatory Visit (INDEPENDENT_AMBULATORY_CARE_PROVIDER_SITE_OTHER): Payer: Self-pay | Admitting: Gastroenterology

## 2016-09-24 VITALS — BP 179/79 | HR 70 | Temp 98.2°F | Ht 71.0 in | Wt 242.2 lb

## 2016-09-24 DIAGNOSIS — R1011 Right upper quadrant pain: Secondary | ICD-10-CM

## 2016-09-24 DIAGNOSIS — K8050 Calculus of bile duct without cholangitis or cholecystitis without obstruction: Secondary | ICD-10-CM

## 2016-09-24 NOTE — Progress Notes (Signed)
Referring Provider: Raiford Simmonds., PA-C Primary Care Physician:  Raiford Simmonds., PA-C  Primary GI: Dr. [name]   Chief Complaint  Patient presents with  . Abdominal Pain    RUQ  . Gas    HPI:   Jeremy Burgess is a 51 y.o. male presenting today with a history of chronic RUQ pain since June 2017 after a tree fell on him. CT in October with multiple remotely healed right-sided rib fractures. LFTs normal. EGD on file that is normal. Colonoscopy with adenoma recently and due for surveillance in 5 years. Last seen in Nov 2017 with US abdomen ordered noting gallstones. HIDA then pursued with Ensure and EF was 46%.   Tells me he had reproducible symptoms with ensure. Will eat and have pressure in RUQ, "swelling", and lots of gas. Different than ribcage discomfort.  No N/V associated with eating. Pain is usually constant but worsened with eating. No reflux symptoms. Noted increased in flatulence. No constipation. Occasional diarrhea.     Past Medical History:  Diagnosis Date  . Accidentally struck by falling tree 02/08/2015  . Acute blood loss anemia 02/09/2015  . Acute respiratory failure ([place]) 02/09/2015  . Chronic pain due to trauma   . Diabetes mellitus   . Diabetes mellitus ([place])   . History of blood clots    DVT  . Hypertension   . Pneumothorax   . Postlaminectomy syndrome, lumbar region 11/13/2015  . Rib fracture   . Smoker 0   denies smoking  . Thrombocytopenia ([place]) 02/09/2015  . Traumatic closed displaced fracture of multiple ribs of right side 02/08/2015  . Traumatic hemopneumothorax 02/09/2015  . Upper GI bleeding 05/27/2016   Trivial. EGD unremarkable.    Past Surgical History:  Procedure Laterality Date  . BACK SURGERY    . COLONOSCOPY N/A 07/25/2016   Dr. [name]: 9 mm tubular adenoma. 5 year surveillance   . ESOPHAGOGASTRODUODENOSCOPY  2009   EGD completed by Dr. [name] due to hematemesis. Findings of normal esophagus, tiny antral erosions, normal D1, D2  .  ESOPHAGOGASTRODUODENOSCOPY N/A 05/27/2016   Dr. [name]: normal   . EXTERNAL FIXATION LEG Right 02/09/2015   Procedure: EXTERNAL FIXATION LEG;  Surgeon: [name], MD;  Location: [place];  Service: Orthopedics;  Laterality: Right;  . EXTERNAL FIXATION REMOVAL Right 02/14/2015   Procedure: REMOVAL EXTERNAL FIXATION LEG;  Surgeon: [name], MD;  Location: [place];  Service: Orthopedics;  Laterality: Right;  . FEMUR IM NAIL Right 02/09/2015   Procedure: INTRAMEDULLARY (IM) RETROGRADE FEMORAL NAILING;  Surgeon: [name], MD;  Location: [place];  Service: Orthopedics;  Laterality: Right;  . NOSE SURGERY    . ORIF ANKLE FRACTURE Right 02/14/2015   Procedure: OPEN REDUCTION INTERNAL FIXATION (ORIF) PILON FRACTURE;  Surgeon: [name], MD;  Location: [place];  Service: Orthopedics;  Laterality: Right;  . ORIF ULNAR FRACTURE Right 02/09/2015   Procedure: OPEN REDUCTION INTERNAL FIXATION (ORIF) ULNAR FRACTURE;  Surgeon: [name], MD;  Location: [place];  Service: Orthopedics;  Laterality: Right;  . POLYPECTOMY  07/25/2016   Procedure: POLYPECTOMY;  Surgeon: [name], MD;  [facility];  Service: Endoscopy;;  colon    Current Outpatient Prescriptions  Medication Sig Dispense Refill  . insulin detemir (LEVEMIR) 100 UNIT/ML injection Inject 0.6 mLs (60 Units total) into the skin at bedtime.    [name] lisinopril (PRINIVIL,ZESTRIL) 20 MG tablet Take 20 mg by mouth daily.     [name]  sitaGLIPtin-metformin (JANUMET) 50-1000 MG tablet Take 1 tablet by mouth 2 (two) times daily with a meal.     No current facility-administered medications for this visit.     Allergies as of 09/24/2016 - Review Complete 09/24/2016  Allergen Reaction Noted  . Bee venom Anaphylaxis 02/08/2015    Family History  Problem Relation Age of Onset  . Diabetes Mother   . COPD Mother   . Diabetes Sister   . Colon cancer Neg Hx     Social History   Social History  . Marital status: Single     Spouse name: N/A  . Number of children: N/A  . Years of education: N/A   Social History Main Topics  . Smoking status: Never Smoker  . Smokeless tobacco: Never Used  . Alcohol use No     Comment: history of alcohol abuse but quit in 2008   . Drug use: No  . Sexual activity: Not Asked   Other Topics Concern  . None   Social History Narrative   ** Merged History Encounter **        Review of Systems: Gen: Denies fever, chills, anorexia. Denies fatigue, weakness, weight loss.  CV: Denies chest pain, palpitations, syncope, peripheral edema, and claudication. Resp: Denies dyspnea at rest, cough, wheezing, coughing up blood, and pleurisy. GI: see HPI  Derm: Denies rash, itching, dry skin Psych: Denies depression, anxiety, memory loss, confusion. No homicidal or suicidal ideation.  Heme: Denies bruising, bleeding, and enlarged lymph nodes.  Physical Exam: BP (!) 179/79   Pulse 70   Temp 98.2 F (36.8 C) (Oral)   Ht 5\' 11"  (1.803 m)   Wt 242 lb 3.2 oz (109.9 kg)   BMI 33.78 kg/m  General:   Alert and oriented. No distress noted. Pleasant and cooperative.  Head:  Normocephalic and atraumatic. Eyes:  Conjuctiva clear without scleral icterus. Mouth:  Oral mucosa pink and moist.  Abdomen:  +BS, soft, RUQ TTP with deep palpation and non-distended. Obese, no rebound or guarding. Possible ventral hernia vs rectus diastasis  Extremities:  Without edema. Neurologic:  Alert and  oriented x4;  grossly normal neurologically. Psych:  Alert and cooperative. Normal mood and affect.   Lab Results  Component Value Date   ALT 34 05/27/2016   AST 40 05/27/2016   ALKPHOS 96 05/27/2016   BILITOT 0.7 05/27/2016

## 2016-09-24 NOTE — Patient Instructions (Signed)
You may take a probiotic daily such as Align, [place], [place].   I have referred you to a surgeon to talk about taking your gallbladder out.  We will see you in 6 months!

## 2016-09-24 NOTE — Assessment & Plan Note (Signed)
51 year old pleasant male with history of chronic right-sided rib discomfort s/p injury from tree last June; however, he has had more prevalent RUQ discomfort that is constant, worsened with eating, associated bloating. US abdomen with gallstones, and HIDA with ensure noted normal EF at 46% but he did have reproducible symptoms per his report. He has had an EGD fairly recently that was entirely normal. Colonoscopy up-to-date. LFTs normal. Proceed with referral to general surgeon to discuss cholecystectomy. I did inform him that it is not guaranteed that his symptoms would resolve with cholecystectomy. Discussed dietary/behavior modification.

## 2016-09-25 NOTE — Progress Notes (Signed)
CC'ED TO PCP 

## 2016-09-26 ENCOUNTER — Telehealth: Payer: Self-pay

## 2016-09-26 NOTE — Telephone Encounter (Signed)
Received fax from Dr. Arnoldo Morale office (surgeon). Pt has appt 10/17/16 at 10:00am. LMOVM and informed pt. Also mailed letter.

## 2016-10-14 ENCOUNTER — Ambulatory Visit (HOSPITAL_BASED_OUTPATIENT_CLINIC_OR_DEPARTMENT_OTHER): Payer: Self-pay | Admitting: Physical Medicine & Rehabilitation

## 2016-10-14 ENCOUNTER — Encounter: Payer: Self-pay | Attending: Physical Medicine & Rehabilitation

## 2016-10-14 ENCOUNTER — Encounter: Payer: Self-pay | Admitting: Physical Medicine & Rehabilitation

## 2016-10-14 VITALS — BP 186/84 | HR 66

## 2016-10-14 DIAGNOSIS — M25511 Pain in right shoulder: Secondary | ICD-10-CM

## 2016-10-14 DIAGNOSIS — R0781 Pleurodynia: Secondary | ICD-10-CM | POA: Insufficient documentation

## 2016-10-14 DIAGNOSIS — Z9889 Other specified postprocedural states: Secondary | ICD-10-CM | POA: Insufficient documentation

## 2016-10-14 DIAGNOSIS — M5030 Other cervical disc degeneration, unspecified cervical region: Secondary | ICD-10-CM | POA: Insufficient documentation

## 2016-10-14 DIAGNOSIS — G8921 Chronic pain due to trauma: Secondary | ICD-10-CM

## 2016-10-14 DIAGNOSIS — M4316 Spondylolisthesis, lumbar region: Secondary | ICD-10-CM | POA: Insufficient documentation

## 2016-10-14 DIAGNOSIS — G8929 Other chronic pain: Secondary | ICD-10-CM

## 2016-10-14 MED ORDER — MELOXICAM 7.5 MG PO TABS: 7.5000 mg | ORAL_TABLET | Freq: Every day | ORAL | 1 refills | Status: DC

## 2016-10-14 NOTE — Progress Notes (Signed)
Subjective:    Patient ID: Jeremy Burgess, male    DOB: 12-Aug-1966, 51 y.o.   MRN: [id number] CC: R Hip, ankle and shoulder pain HPI Fluoroguided hip injection helped for ~2 wks  Saw [name] for colonoscopy, also had abd ultrasound showing gallstones, is having Gen Surg consult for Lap assist chole  Right shoulder is the worst, no new falls or injuries, using cane in Left hand now  Some tingling in both hands when laying on his back   Pain Inventory Average Pain 9 Pain Right Now 9 My pain is sharp, stabbing and aching  In the last 24 hours, has pain interfered with the following? General activity 10 Relation with others 9 Enjoyment of life 10 What TIME of day is your pain at its worst? daytime Sleep (in general) Poor  Pain is worse with: walking, bending, sitting, inactivity and standing Pain improves with: rest and medication Relief from Meds: 1  Mobility use a cane how many minutes can you walk? 5 ability to climb steps?  no Do you have any goals in this area?  yes  Function disabled: date disabled .  Neuro/Psych weakness numbness tingling trouble walking spasms depression  Prior Studies Any changes since last visit?  no  Physicians involved in your care Any changes since last visit?  no   Family History  Problem Relation Age of Onset  . Diabetes Mother   . COPD Mother   . Diabetes Sister   . Colon cancer Neg Hx    Social History   Social History  . Marital status: Single    Spouse name: N/A  . Number of children: N/A  . Years of education: N/A   Social History Main Topics  . Smoking status: Never Smoker  . Smokeless tobacco: Never Used  . Alcohol use No     Comment: history of alcohol abuse but quit in 2008   . Drug use: No  . Sexual activity: Not Asked   Other Topics Concern  . None   Social History Narrative   ** Merged History Encounter **       Past Surgical History:  Procedure Laterality Date  . BACK SURGERY    .  COLONOSCOPY N/A 07/25/2016   Dr. [name]: 9 mm tubular adenoma. 5 year surveillance   . ESOPHAGOGASTRODUODENOSCOPY  2009   EGD completed by Dr. [name] due to hematemesis. Findings of normal esophagus, tiny antral erosions, normal D1, D2  . ESOPHAGOGASTRODUODENOSCOPY N/A 05/27/2016   Dr. [name]: normal   . EXTERNAL FIXATION LEG Right 02/09/2015   Procedure: EXTERNAL FIXATION LEG;  Surgeon: [name], MD;  Location: [place];  Service: Orthopedics;  Laterality: Right;  . EXTERNAL FIXATION REMOVAL Right 02/14/2015   Procedure: REMOVAL EXTERNAL FIXATION LEG;  Surgeon: [name], MD;  Location: [place];  Service: Orthopedics;  Laterality: Right;  . FEMUR IM NAIL Right 02/09/2015   Procedure: INTRAMEDULLARY (IM) RETROGRADE FEMORAL NAILING;  Surgeon: [name], MD;  Location: [place];  Service: Orthopedics;  Laterality: Right;  . NOSE SURGERY    . ORIF ANKLE FRACTURE Right 02/14/2015   Procedure: OPEN REDUCTION INTERNAL FIXATION (ORIF) PILON FRACTURE;  Surgeon: [name], MD;  Location: [place];  Service: Orthopedics;  Laterality: Right;  . ORIF ULNAR FRACTURE Right 02/09/2015   Procedure: OPEN REDUCTION INTERNAL FIXATION (ORIF) ULNAR FRACTURE;  Surgeon: [name], MD;  Location: [place];  Service: Orthopedics;  Laterality: Right;  . POLYPECTOMY  07/25/2016  Procedure: POLYPECTOMY;  Surgeon: [name], MD;  [facility];  Service: Endoscopy;;  colon   Past Medical History:  Diagnosis Date  . Accidentally struck by falling tree 02/08/2015  . Acute blood loss anemia 02/09/2015  . Acute respiratory failure ([place]) 02/09/2015  . Chronic pain due to trauma   . Diabetes mellitus   . Diabetes mellitus ([place])   . History of blood clots    DVT  . Hypertension   . Pneumothorax   . Postlaminectomy syndrome, lumbar region 11/13/2015  . Rib fracture   . Smoker 0   denies smoking  . Thrombocytopenia ([place]) 02/09/2015  . Traumatic closed displaced fracture of multiple ribs of right  side 02/08/2015  . Traumatic hemopneumothorax 02/09/2015  . Upper GI bleeding 05/27/2016   Trivial. EGD unremarkable.   BP (!) 186/84   Pulse 66   SpO2 97%   Opioid Risk Score:   Fall Risk Score:  `1  Depression screen PHQ 2/9  Depression screen [facility] 2/9 10/14/2016 12/11/2015 11/13/2015  Decreased Interest 3 3 3   Down, Depressed, Hopeless 3 3 3   PHQ - 2 Score 6 6 6   Altered sleeping - - 3  Tired, decreased energy - - 3  Change in appetite - - 3  Feeling bad or failure about yourself  - - 3  Trouble concentrating - - 3  Moving slowly or fidgety/restless - - 3  Suicidal thoughts - - 0  PHQ-9 Score - - 24    Review of Systems  Constitutional: Positive for diaphoresis.  HENT: Negative.   Eyes: Negative.   Respiratory: Negative.   Cardiovascular: Positive for leg swelling.  Gastrointestinal: Positive for abdominal pain.  Endocrine:       High blood sugars  Genitourinary: Negative.   Musculoskeletal: Positive for gait problem.       Spasms  Skin: Negative.   Allergic/Immunologic: Negative.   Neurological: Positive for weakness and numbness.       Tingling  Hematological: Negative.   Psychiatric/Behavioral: Positive for dysphoric mood.  All other systems reviewed and are negative.      Objective:   Physical Exam  Constitutional: He is oriented to person, place, and time. He appears well-developed and well-nourished.  HENT:  Head: Normocephalic and atraumatic.  Eyes: Conjunctivae and EOM are normal. Pupils are equal, round, and reactive to light.  Neurological: He is alert and oriented to person, place, and time.  Skin: Skin is warm and dry.  Psychiatric: He has a normal mood and affect.  Nursing note and vitals reviewed. Motor strength is 5/5 bilateral deltoid, biceps, triceps, grip, he has some give way weakness. Right hip flexors, knee extensors.  Cervical range of motion reduced with flexion, extension, lateral bending and rotation approximately 75% of normal  range Negative Tinel's, negative reverse Phalen's. Sensation reduced to pinprick and light touch in all digits of both hands. Lying decreased C5 and C6 on right   Assessment & Plan:  1. Chronic pain post trauma. History of rib fractures on the right side as well as right femur fracture. He also had a right pilon fracture.  2. The right lower rib pain may actually be from cholecystitis, follow-up with gastroenterology

## 2016-10-17 ENCOUNTER — Encounter: Payer: Self-pay | Admitting: General Surgery

## 2016-10-17 ENCOUNTER — Ambulatory Visit (INDEPENDENT_AMBULATORY_CARE_PROVIDER_SITE_OTHER): Payer: Self-pay | Admitting: General Surgery

## 2016-10-17 VITALS — BP 169/88 | HR 62 | Temp 98.7°F | Resp 18 | Ht 70.0 in | Wt 245.0 lb

## 2016-10-17 DIAGNOSIS — K811 Chronic cholecystitis: Secondary | ICD-10-CM

## 2016-10-17 NOTE — Patient Instructions (Signed)
Laparoscopic Cholecystectomy Laparoscopic cholecystectomy is surgery to remove the gallbladder. The gallbladder is a pear-shaped organ that lies beneath the liver on the right side of the body. The gallbladder stores bile, which is a fluid that helps the body to digest fats. Cholecystectomy is often done for inflammation of the gallbladder (cholecystitis). This condition is usually caused by a buildup of gallstones (cholelithiasis) in the gallbladder. Gallstones can block the flow of bile, which can result in inflammation and pain. In severe cases, emergency surgery may be required. This procedure is done though small incisions in your abdomen (laparoscopic surgery). A thin scope with a camera (laparoscope) is inserted through one incision. Thin surgical instruments are inserted through the other incisions. In some cases, a laparoscopic procedure may be turned into a type of surgery that is done through a larger incision (open surgery). Tell a health care provider about:  Any allergies you have.  All medicines you are taking, including vitamins, herbs, eye drops, creams, and over-the-counter medicines.  Any problems you or family members have had with anesthetic medicines.  Any blood disorders you have.  Any surgeries you have had.  Any medical conditions you have.  Whether you are pregnant or may be pregnant. What are the risks? Generally, this is a safe procedure. However, problems may occur, including:  Infection.  Bleeding.  Allergic reactions to medicines.  Damage to other structures or organs.  A stone remaining in the common bile duct. The common bile duct carries bile from the gallbladder into the small intestine.  A bile leak from the cyst duct that is clipped when your gallbladder is removed. What happens before the procedure? Staying hydrated  Follow instructions from your health care provider about hydration, which may include:  Up to 2 hours before the procedure - you  may continue to drink clear liquids, such as water, clear fruit juice, black coffee, and plain tea. Eating and drinking restrictions  Follow instructions from your health care provider about eating and drinking, which may include:  8 hours before the procedure - stop eating heavy meals or foods such as meat, fried foods, or fatty foods.  6 hours before the procedure - stop eating light meals or foods, such as toast or cereal.  6 hours before the procedure - stop drinking milk or drinks that contain milk.  2 hours before the procedure - stop drinking clear liquids. Medicines   Ask your health care provider about:  Changing or stopping your regular medicines. This is especially important if you are taking diabetes medicines or blood thinners.  Taking medicines such as aspirin and ibuprofen. These medicines can thin your blood. Do not take these medicines before your procedure if your health care provider instructs you not to.  You may be given antibiotic medicine to help prevent infection. General instructions   Let your health care provider know if you develop a cold or an infection before surgery.  Plan to have someone take you home from the hospital or clinic.  Ask your health care provider how your surgical site will be marked or identified. What happens during the procedure?  To reduce your risk of infection:  Your health care team will wash or sanitize their hands.  Your skin will be washed with soap.  Hair may be removed from the surgical area.  An IV tube may be inserted into one of your veins.  You will be given one or more of the following:  A medicine to help you relax (  sedative).  A medicine to make you fall asleep (general anesthetic).  A breathing tube will be placed in your mouth.  Your surgeon will make several small cuts (incisions) in your abdomen.  The laparoscope will be inserted through one of the small incisions. The camera on the laparoscope will  send images to a TV screen (monitor) in the operating room. This lets your surgeon see inside your abdomen.  Air-like gas will be pumped into your abdomen. This will expand your abdomen to give the surgeon more room to perform the surgery.  Other tools that are needed for the procedure will be inserted through the other incisions. The gallbladder will be removed through one of the incisions.  Your common bile duct may be examined. If stones are found in the common bile duct, they may be removed.  After your gallbladder has been removed, the incisions will be closed with stitches (sutures), staples, or skin glue.  Your incisions may be covered with a bandage (dressing). The procedure may vary among health care providers and hospitals. What happens after the procedure?  Your blood pressure, heart rate, breathing rate, and blood oxygen level will be monitored until the medicines you were given have worn off.  You will be given medicines as needed to control your pain.  Do not drive for 24 hours if you were given a sedative. This information is not intended to replace advice given to you by your health care provider. Make sure you discuss any questions you have with your health care provider. Document Released: 07/29/2005 Document Revised: 02/18/2016 Document Reviewed: 01/15/2016 Elsevier Interactive Patient Education  2017 [employer]  

## 2016-10-17 NOTE — Progress Notes (Signed)
Jeremy Burgess; [id number]; [birth year]   HPI   Patient is a 51 year old white male who presents complaining of right upper quadrant abdominal pain that has persisted since last June 2017 after an accident where he suffered right-sided injury due to a falling tree.  He was referred by gastroenterology for further evaluation and treatment.  He has had an extensive workup including EGD, colonoscopy, liver enzyme tests, all of which appeared normal.  He did have an ultrasound of his right upper quadrant which revealed no cholelithiasis.  He does have a history of rib fractures on the right side and this was seen on CT scan of the abdomen.  He recently had a HIDA scan which showed a 45% ejection fraction but reproducible symptoms with a fatty meal.  He states that when he does eat food, he developed pain in the right upper quadrant which radiates around to the right flank.  He does describe some bloating.  When he does have the pain, it is an 8/10.  He currently does not have any significant pain.  He denies any fever, chills, jaundice. Past Medical History:  Diagnosis Date  . Accidentally struck by falling tree 02/08/2015  . Acute blood loss anemia 02/09/2015  . Acute respiratory failure ([place]) 02/09/2015  . Chronic pain due to trauma   . Diabetes mellitus   . Diabetes mellitus ([place])   . History of blood clots    DVT  . Hypertension   . Pneumothorax   . Postlaminectomy syndrome, lumbar region 11/13/2015  . Rib fracture   . Smoker 0   denies smoking  . Thrombocytopenia ([place]) 02/09/2015  . Traumatic closed displaced fracture of multiple ribs of right side 02/08/2015  . Traumatic hemopneumothorax 02/09/2015  . Upper GI bleeding 05/27/2016   Trivial. EGD unremarkable.    Past Surgical History:  Procedure Laterality Date  . BACK SURGERY    . COLONOSCOPY N/A 07/25/2016   Dr. [name]: 9 mm tubular adenoma. 5 year surveillance   . ESOPHAGOGASTRODUODENOSCOPY  2009   EGD completed by Dr. [name] due to  hematemesis. Findings of normal esophagus, tiny antral erosions, normal D1, D2  . ESOPHAGOGASTRODUODENOSCOPY N/A 05/27/2016   Dr. [name]: normal   . EXTERNAL FIXATION LEG Right 02/09/2015   Procedure: EXTERNAL FIXATION LEG;  Surgeon: [name], MD;  Location: [facility];  Service: Orthopedics;  Laterality: Right;  . EXTERNAL FIXATION REMOVAL Right 02/14/2015   Procedure: REMOVAL EXTERNAL FIXATION LEG;  Surgeon: [name], MD;  Location: [place];  Service: Orthopedics;  Laterality: Right;  . FEMUR IM NAIL Right 02/09/2015   Procedure: INTRAMEDULLARY (IM) RETROGRADE FEMORAL NAILING;  Surgeon: [name], MD;  Location: [place];  Service: Orthopedics;  Laterality: Right;  . NOSE SURGERY    . ORIF ANKLE FRACTURE Right 02/14/2015   Procedure: OPEN REDUCTION INTERNAL FIXATION (ORIF) PILON FRACTURE;  Surgeon: [name], MD;  Location: [place];  Service: Orthopedics;  Laterality: Right;  . ORIF ULNAR FRACTURE Right 02/09/2015   Procedure: OPEN REDUCTION INTERNAL FIXATION (ORIF) ULNAR FRACTURE;  Surgeon: [name], MD;  Location: [facility];  Service: Orthopedics;  Laterality: Right;  . POLYPECTOMY  07/25/2016   Procedure: POLYPECTOMY;  Surgeon: [name], MD;  [facility];  Service: Endoscopy;;  colon    Family History  Problem Relation Age of Onset  . Diabetes Mother   . COPD Mother   . Diabetes Sister   . Colon cancer Neg Hx  Current Outpatient Prescriptions on File Prior to Visit  Medication Sig Dispense Refill  . insulin detemir (LEVEMIR) 100 UNIT/ML injection Inject 0.6 mLs (60 Units total) into the skin at bedtime.    [name] lisinopril (PRINIVIL,ZESTRIL) 20 MG tablet Take 20 mg by mouth daily.     . meloxicam (MOBIC) 7.5 MG tablet Take 1 tablet (7.5 mg total) by mouth daily. 14 tablet 1  . sitaGLIPtin-metformin (JANUMET) 50-1000 MG tablet Take 1 tablet by mouth 2 (two) times daily with a meal.     No current facility-administered medications on file prior to  visit.     Allergies  Allergen Reactions  . Bee Venom Anaphylaxis    intolerance    History  Alcohol Use No    Comment: history of alcohol abuse but quit in 2008     History  Smoking Status  . Never Smoker  Smokeless Tobacco  . Never Used    Review of Systems  Constitutional: Positive for malaise/fatigue.  HENT: Negative.   Eyes: Negative.   Respiratory: Negative.   Cardiovascular: Negative.   Gastrointestinal: Positive for abdominal pain and nausea. Negative for heartburn and vomiting.  Genitourinary: Positive for frequency.  Musculoskeletal: Positive for back pain, joint pain and neck pain.  Skin: Negative.   Neurological: Negative.   Endo/Heme/Allergies: Negative.   Psychiatric/Behavioral: Negative.     Objective   Vitals:   10/17/16 1019  BP: (!) 169/88  Pulse: 62  Resp: 18  Temp: 98.7 F (37.1 C)    Physical Exam  Constitutional: He is oriented to person, place, and time and well-developed, well-nourished, and in no distress.  HENT:  Head: Normocephalic and atraumatic.  Eyes: No scleral icterus.  Neck: Normal range of motion. Neck supple.  Cardiovascular: Normal rate, regular rhythm and normal heart sounds.   Pulmonary/Chest: Effort normal and breath sounds normal. He has no wheezes. He has no rales. He exhibits no tenderness.  Abdominal: Soft. Bowel sounds are normal. He exhibits no distension. There is tenderness. There is no rebound and no guarding.  Mild discomfort to deep palpation in the right upper quadrant.  No rigidity noted.  Neurological: He is alert and oriented to person, place, and time.  Skin: Skin is warm and dry.  Vitals reviewed.   Assessment    Chronic cholecystitis Plan    patient is scheduled for a laparoscopic cholecystectomy on 10/30/2016.  The risks and benefits of the procedure including bleeding, infection, hepatobiliary injury, the possibility of an open procedure, and the possibility of recurrence of his symptoms were  fully explained to the patient, who gave informed consent.

## 2016-10-17 NOTE — H&P (Signed)
Jeremy Burgess; [id number]; [birth year]   HPI   Patient is a 51 year old white male who presents complaining of right upper quadrant abdominal pain that has persisted since last June 2017 after an accident where he suffered right-sided injury due to a falling tree.  He was referred by gastroenterology for further evaluation and treatment.  He has had an extensive workup including EGD, colonoscopy, liver enzyme tests, all of which appeared normal.  He did have an ultrasound of his right upper quadrant which revealed no cholelithiasis.  He does have a history of rib fractures on the right side and this was seen on CT scan of the abdomen.  He recently had a HIDA scan which showed a 45% ejection fraction but reproducible symptoms with a fatty meal.  He states that when he does eat food, he developed pain in the right upper quadrant which radiates around to the right flank.  He does describe some bloating.  When he does have the pain, it is an 8/10.  He currently does not have any significant pain.  He denies any fever, chills, jaundice.     Past Medical History:  Diagnosis Date  . Accidentally struck by falling tree 02/08/2015  . Acute blood loss anemia 02/09/2015  . Acute respiratory failure ([place]) 02/09/2015  . Chronic pain due to trauma   . Diabetes mellitus   . Diabetes mellitus ([place])   . History of blood clots    DVT  . Hypertension   . Pneumothorax   . Postlaminectomy syndrome, lumbar region 11/13/2015  . Rib fracture   . Smoker 0   denies smoking  . Thrombocytopenia ([place]) 02/09/2015  . Traumatic closed displaced fracture of multiple ribs of right side 02/08/2015  . Traumatic hemopneumothorax 02/09/2015  . Upper GI bleeding 05/27/2016   Trivial. EGD unremarkable.         Past Surgical History:  Procedure Laterality Date  . BACK SURGERY    . COLONOSCOPY N/A 07/25/2016   Dr. [name]: 9 mm tubular adenoma. 5 year surveillance   . ESOPHAGOGASTRODUODENOSCOPY  2009   EGD  completed by Dr. [name] due to hematemesis. Findings of normal esophagus, tiny antral erosions, normal D1, D2  . ESOPHAGOGASTRODUODENOSCOPY N/A 05/27/2016   Dr. [name]: normal   . EXTERNAL FIXATION LEG Right 02/09/2015   Procedure: EXTERNAL FIXATION LEG;  Surgeon: [name], MD;  Location: [place] [facility];  Service: Orthopedics;  Laterality: Right;  . EXTERNAL FIXATION REMOVAL Right 02/14/2015   Procedure: REMOVAL EXTERNAL FIXATION LEG;  Surgeon: [name], MD;  Location: [facility];  Service: Orthopedics;  Laterality: Right;  . FEMUR IM NAIL Right 02/09/2015   Procedure: INTRAMEDULLARY (IM) RETROGRADE FEMORAL NAILING;  Surgeon: [name], MD;  Location: [place];  Service: Orthopedics;  Laterality: Right;  . NOSE SURGERY    . ORIF ANKLE FRACTURE Right 02/14/2015   Procedure: OPEN REDUCTION INTERNAL FIXATION (ORIF) PILON FRACTURE;  Surgeon: [name], MD;  Location: [facility];  Service: Orthopedics;  Laterality: Right;  . ORIF ULNAR FRACTURE Right 02/09/2015   Procedure: OPEN REDUCTION INTERNAL FIXATION (ORIF) ULNAR FRACTURE;  Surgeon: [name], MD;  Location: [facility] [place];  Service: Orthopedics;  Laterality: Right;  . POLYPECTOMY  07/25/2016   Procedure: POLYPECTOMY;  Surgeon: [name], MD;  [facility];  Service: Endoscopy;;  colon         Family History  Problem Relation Age of Onset  . Diabetes Mother   . COPD Mother   .  Diabetes Sister   . Colon cancer Neg Hx           Current Outpatient Prescriptions on File Prior to Visit  Medication Sig Dispense Refill  . insulin detemir (LEVEMIR) 100 UNIT/ML injection Inject 0.6 mLs (60 Units total) into the skin at bedtime.    [name] lisinopril (PRINIVIL,ZESTRIL) 20 MG tablet Take 20 mg by mouth daily.     . meloxicam (MOBIC) 7.5 MG tablet Take 1 tablet (7.5 mg total) by mouth daily. 14 tablet 1  . sitaGLIPtin-metformin (JANUMET) 50-1000 MG tablet Take 1 tablet by mouth 2 (two) times daily with a meal.      No current facility-administered medications on file prior to visit.          Allergies  Allergen Reactions  . Bee Venom Anaphylaxis    intolerance         History  Alcohol Use No    Comment: history of alcohol abuse but quit in 2008         History  Smoking Status  . Never Smoker  Smokeless Tobacco  . Never Used    Review of Systems  Constitutional: Positive for malaise/fatigue.  HENT: Negative.   Eyes: Negative.   Respiratory: Negative.   Cardiovascular: Negative.   Gastrointestinal: Positive for abdominal pain and nausea. Negative for heartburn and vomiting.  Genitourinary: Positive for frequency.  Musculoskeletal: Positive for back pain, joint pain and neck pain.  Skin: Negative.   Neurological: Negative.   Endo/Heme/Allergies: Negative.   Psychiatric/Behavioral: Negative.     Objective      Vitals:   10/17/16 1019  BP: (!) 169/88  Pulse: 62  Resp: 18  Temp: 98.7 F (37.1 C)    Physical Exam  Constitutional: He is oriented to person, place, and time and well-developed, well-nourished, and in no distress.  HENT:  Head: Normocephalic and atraumatic.  Eyes: No scleral icterus.  Neck: Normal range of motion. Neck supple.  Cardiovascular: Normal rate, regular rhythm and normal heart sounds.   Pulmonary/Chest: Effort normal and breath sounds normal. He has no wheezes. He has no rales. He exhibits no tenderness.  Abdominal: Soft. Bowel sounds are normal. He exhibits no distension. There is tenderness. There is no rebound and no guarding.  Mild discomfort to deep palpation in the right upper quadrant.  No rigidity noted.  Neurological: He is alert and oriented to person, place, and time.  Skin: Skin is warm and dry.  Vitals reviewed.   Assessment    Chronic cholecystitis Plan    patient is scheduled for a laparoscopic cholecystectomy on 10/30/2016.  The risks and benefits of the procedure including bleeding, infection,  hepatobiliary injury, the possibility of an open procedure, and the possibility of recurrence of his symptoms were fully explained to the patient, who gave informed consent.

## 2016-10-21 ENCOUNTER — Ambulatory Visit (HOSPITAL_COMMUNITY)
Admission: RE | Admit: 2016-10-21 | Discharge: 2016-10-21 | Disposition: A | Discharge status: Home / Self Care | Payer: Self-pay | Source: Ambulatory Visit | Attending: Physical Medicine & Rehabilitation | Admitting: Physical Medicine & Rehabilitation

## 2016-10-21 DIAGNOSIS — M25511 Pain in right shoulder: Secondary | ICD-10-CM | POA: Insufficient documentation

## 2016-10-21 DIAGNOSIS — G8929 Other chronic pain: Secondary | ICD-10-CM | POA: Insufficient documentation

## 2016-10-21 NOTE — Patient Instructions (Signed)
Jeremy Burgess  10/21/2016     @PREFPERIOPPHARMACY @   Your procedure is scheduled on  10/30/2016   Report to [facility] at  80  A.M.  Call this number if you have problems the morning of surgery:  [phone]   Remember:  Do not eat food or drink liquids after midnight.  Take these medicines the morning of surgery with A SIP OF WATER  Lisinopril, mobic   Do not wear jewelry, make-up or nail polish.  Do not wear lotions, powders, or perfumes, or deoderant.  Do not shave 48 hours prior to surgery.  Men may shave face and neck.  Do not bring valuables to the hospital.  [facility] is not responsible for any belongings or valuables.  Contacts, dentures or bridgework may not be worn into surgery.  Leave your suitcase in the car.  After surgery it may be brought to your room.  For patients admitted to the hospital, discharge time will be determined by your treatment team.  Patients discharged the day of surgery will not be allowed to drive home.   Name and phone number of your driver:   family Special instructions:  None  Please read over the following fact sheets that you were given. Anesthesia Post-op Instructions and Care and Recovery After Surgery       Laparoscopic Cholecystectomy Laparoscopic cholecystectomy is surgery to remove the gallbladder. The gallbladder is a pear-shaped organ that lies beneath the liver on the right side of the body. The gallbladder stores bile, which is a fluid that helps the body to digest fats. Cholecystectomy is often done for inflammation of the gallbladder (cholecystitis). This condition is usually caused by a buildup of gallstones (cholelithiasis) in the gallbladder. Gallstones can block the flow of bile, which can result in inflammation and pain. In severe cases, emergency surgery may be required. This procedure is done though small incisions in your abdomen (laparoscopic surgery). A thin scope with a camera (laparoscope) is  inserted through one incision. Thin surgical instruments are inserted through the other incisions. In some cases, a laparoscopic procedure may be turned into a type of surgery that is done through a larger incision (open surgery). Tell a health care provider about:  Any allergies you have.  All medicines you are taking, including vitamins, herbs, eye drops, creams, and over-the-counter medicines.  Any problems you or family members have had with anesthetic medicines.  Any blood disorders you have.  Any surgeries you have had.  Any medical conditions you have.  Whether you are pregnant or may be pregnant. What are the risks? Generally, this is a safe procedure. However, problems may occur, including:  Infection.  Bleeding.  Allergic reactions to medicines.  Damage to other structures or organs.  A stone remaining in the common bile duct. The common bile duct carries bile from the gallbladder into the small intestine.  A bile leak from the cyst duct that is clipped when your gallbladder is removed. What happens before the procedure? Staying hydrated  Follow instructions from your health care provider about hydration, which may include:  Up to 2 hours before the procedure - you may continue to drink clear liquids, such as water, clear fruit juice, black coffee, and plain tea. Eating and drinking restrictions  Follow instructions from your health care provider about eating and drinking, which may include:  8 hours before the procedure - stop eating heavy meals or foods such as  meat, fried foods, or fatty foods.  6 hours before the procedure - stop eating light meals or foods, such as toast or cereal.  6 hours before the procedure - stop drinking milk or drinks that contain milk.  2 hours before the procedure - stop drinking clear liquids. Medicines   Ask your health care provider about:  Changing or stopping your regular medicines. This is especially important if you are  taking diabetes medicines or blood thinners.  Taking medicines such as aspirin and ibuprofen. These medicines can thin your blood. Do not take these medicines before your procedure if your health care provider instructs you not to.  You may be given antibiotic medicine to help prevent infection. General instructions   Let your health care provider know if you develop a cold or an infection before surgery.  Plan to have someone take you home from the hospital or clinic.  Ask your health care provider how your surgical site will be marked or identified. What happens during the procedure?  To reduce your risk of infection:  Your health care team will wash or sanitize their hands.  Your skin will be washed with soap.  Hair may be removed from the surgical area.  An IV tube may be inserted into one of your veins.  You will be given one or more of the following:  A medicine to help you relax (sedative).  A medicine to make you fall asleep (general anesthetic).  A breathing tube will be placed in your mouth.  Your surgeon will make several small cuts (incisions) in your abdomen.  The laparoscope will be inserted through one of the small incisions. The camera on the laparoscope will send images to a TV screen (monitor) in the operating room. This lets your surgeon see inside your abdomen.  Air-like gas will be pumped into your abdomen. This will expand your abdomen to give the surgeon more room to perform the surgery.  Other tools that are needed for the procedure will be inserted through the other incisions. The gallbladder will be removed through one of the incisions.  Your common bile duct may be examined. If stones are found in the common bile duct, they may be removed.  After your gallbladder has been removed, the incisions will be closed with stitches (sutures), staples, or skin glue.  Your incisions may be covered with a bandage (dressing). The procedure may vary among  health care providers and hospitals. What happens after the procedure?  Your blood pressure, heart rate, breathing rate, and blood oxygen level will be monitored until the medicines you were given have worn off.  You will be given medicines as needed to control your pain.  Do not drive for 24 hours if you were given a sedative. This information is not intended to replace advice given to you by your health care provider. Make sure you discuss any questions you have with your health care provider. Document Released: 07/29/2005 Document Revised: 02/18/2016 Document Reviewed: 01/15/2016 Elsevier Interactive Patient Education  2017 [employer]  Laparoscopic Cholecystectomy, Care After This sheet gives you information about how to care for yourself after your procedure. Your health care provider may also give you more specific instructions. If you have problems or questions, contact your health care provider. What can I expect after the procedure? After the procedure, it is common to have:  Pain at your incision sites. You will be given medicines to control this pain.  Mild nausea or vomiting.  Bloating and  possible shoulder pain from the air-like gas that was used during the procedure. Follow these instructions at home: Incision care    Follow instructions from your health care provider about how to take care of your incisions. Make sure you:  Wash your hands with soap and water before you change your bandage (dressing). If soap and water are not available, use hand sanitizer.  Change your dressing as told by your health care provider.  Leave stitches (sutures), skin glue, or adhesive strips in place. These skin closures may need to be in place for 2 weeks or longer. If adhesive strip edges start to loosen and curl up, you may trim the loose edges. Do not remove adhesive strips completely unless your health care provider tells you to do that.  Do not take baths, swim, or use a hot tub  until your health care provider approves. Ask your health care provider if you can take showers. You may only be allowed to take sponge baths for bathing.  Check your incision area every day for signs of infection. Check for:  More redness, swelling, or pain.  More fluid or blood.  Warmth.  Pus or a bad smell. Activity   Do not drive or use heavy machinery while taking prescription pain medicine.  Do not lift anything that is heavier than 10 lb (4.5 kg) until your health care provider approves.  Do not play contact sports until your health care provider approves.  Do not drive for 24 hours if you were given a medicine to help you relax (sedative).  Rest as needed. Do not return to work or school until your health care provider approves. General instructions   Take over-the-counter and prescription medicines only as told by your health care provider.  To prevent or treat constipation while you are taking prescription pain medicine, your health care provider may recommend that you:  Drink enough fluid to keep your urine clear or pale yellow.  Take over-the-counter or prescription medicines.  Eat foods that are high in fiber, such as fresh fruits and vegetables, whole grains, and beans.  Limit foods that are high in fat and processed sugars, such as fried and sweet foods. Contact a health care provider if:  You develop a rash.  You have more redness, swelling, or pain around your incisions.  You have more fluid or blood coming from your incisions.  Your incisions feel warm to the touch.  You have pus or a bad smell coming from your incisions.  You have a fever.  One or more of your incisions breaks open. Get help right away if:  You have trouble breathing.  You have chest pain.  You have increasing pain in your shoulders.  You faint or feel dizzy when you stand.  You have severe pain in your abdomen.  You have nausea or vomiting that lasts for more than one  day.  You have leg pain. This information is not intended to replace advice given to you by your health care provider. Make sure you discuss any questions you have with your health care provider. Document Released: 07/29/2005 Document Revised: 02/17/2016 Document Reviewed: 01/15/2016 Elsevier Interactive Patient Education  2017 [place] Anesthesia, Adult General anesthesia is the use of medicines to make a person "go to sleep" (be unconscious) for a medical procedure. General anesthesia is often recommended when a procedure:  Is long.  Requires you to be still or in an unusual position.  Is major and can cause  you to lose blood.  Is impossible to do without general anesthesia. The medicines used for general anesthesia are called general anesthetics. In addition to making you sleep, the medicines:  Prevent pain.  Control your blood pressure.  Relax your muscles. Tell a health care provider about:  Any allergies you have.  All medicines you are taking, including vitamins, herbs, eye drops, creams, and over-the-counter medicines.  Any problems you or family members have had with anesthetic medicines.  Types of anesthetics you have had in the past.  Any bleeding disorders you have.  Any surgeries you have had.  Any medical conditions you have.  Any history of heart or lung conditions, such as heart failure, sleep apnea, or chronic obstructive pulmonary disease (COPD).  Whether you are pregnant or may be pregnant.  Whether you use tobacco, alcohol, marijuana, or street drugs.  Any history of Armed forces logistics/support/administrative officer.  Any history of depression or anxiety. What are the risks? Generally, this is a safe procedure. However, problems may occur, including:  Allergic reaction to anesthetics.  Lung and heart problems.  Inhaling food or liquids from your stomach into your lungs (aspiration).  Injury to nerves.  Waking up during your procedure and being unable to  move (rare).  Extreme agitation or a state of mental confusion (delirium) when you wake up from the anesthetic.  Air in the bloodstream, which can lead to stroke. These problems are more likely to develop if you are having a major surgery or if you have an advanced medical condition. You can prevent some of these complications by answering all of your health care provider's questions thoroughly and by following all pre-procedure instructions. General anesthesia can cause side effects, including:  Nausea or vomiting  A sore throat from the breathing tube.  Feeling cold or shivery.  Feeling tired, washed out, or achy.  Sleepiness or drowsiness.  Confusion or agitation. What happens before the procedure? Staying hydrated  Follow instructions from your health care provider about hydration, which may include:  Up to 2 hours before the procedure - you may continue to drink clear liquids, such as water, clear fruit juice, black coffee, and plain tea. Eating and drinking restrictions  Follow instructions from your health care provider about eating and drinking, which may include:  8 hours before the procedure - stop eating heavy meals or foods such as meat, fried foods, or fatty foods.  6 hours before the procedure - stop eating light meals or foods, such as toast or cereal.  6 hours before the procedure - stop drinking milk or drinks that contain milk.  2 hours before the procedure - stop drinking clear liquids. Medicines   Ask your health care provider about:  Changing or stopping your regular medicines. This is especially important if you are taking diabetes medicines or blood thinners.  Taking medicines such as aspirin and ibuprofen. These medicines can thin your blood. Do not take these medicines before your procedure if your health care provider instructs you not to.  Taking new dietary supplements or medicines. Do not take these during the week before your procedure unless  your health care provider approves them.  If you are told to take a medicine or to continue taking a medicine on the day of the procedure, take the medicine with sips of water. General instructions    Ask if you will be going home the same day, the following day, or after a longer hospital stay.  Plan to have someone take you  home.  Plan to have someone stay with you for the first 24 hours after you leave the hospital or clinic.  For 3-6 weeks before the procedure, try not to use any tobacco products, such as cigarettes, chewing tobacco, and e-cigarettes.  You may brush your teeth on the morning of the procedure, but make sure to spit out the toothpaste. What happens during the procedure?  You will be given anesthetics through a mask and through an IV tube in one of your veins.  You may receive medicine to help you relax (sedative).  As soon as you are asleep, a breathing tube may be used to help you breathe.  An anesthesia specialist will stay with you throughout the procedure. He or she will help keep you comfortable and safe by continuing to give you medicines and adjusting the amount of medicine that you get. He or she will also watch your blood pressure, pulse, and oxygen levels to make sure that the anesthetics do not cause any problems.  If a breathing tube was used to help you breathe, it will be removed before you wake up. The procedure may vary among health care providers and hospitals. What happens after the procedure?  You will wake up, often slowly, after the procedure is complete, usually in a recovery area.  Your blood pressure, heart rate, breathing rate, and blood oxygen level will be monitored until the medicines you were given have worn off.  You may be given medicine to help you calm down if you feel anxious or agitated.  If you will be going home the same day, your health care provider may check to make sure you can stand, drink, and urinate.  Your health  care providers will treat your pain and side effects before you go home.  Do not drive for 24 hours if you received a sedative.  You may:  Feel nauseous and vomit.  Have a sore throat.  Have mental slowness.  Feel cold or shivery.  Feel sleepy.  Feel tired.  Feel sore or achy, even in parts of your body where you did not have surgery. This information is not intended to replace advice given to you by your health care provider. Make sure you discuss any questions you have with your health care provider. Document Released: 11/05/2007 Document Revised: 01/09/2016 Document Reviewed: 07/13/2015 Elsevier Interactive Patient Education  2017 [place] Anesthesia, Adult, Care After These instructions provide you with information about caring for yourself after your procedure. Your health care provider may also give you more specific instructions. Your treatment has been planned according to current medical practices, but problems sometimes occur. Call your health care provider if you have any problems or questions after your procedure. What can I expect after the procedure? After the procedure, it is common to have:  Vomiting.  A sore throat.  Mental slowness. It is common to feel:  Nauseous.  Cold or shivery.  Sleepy.  Tired.  Sore or achy, even in parts of your body where you did not have surgery. Follow these instructions at home: For at least 24 hours after the procedure:   Do not:  Participate in activities where you could fall or become injured.  Drive.  Use heavy machinery.  Drink alcohol.  Take sleeping pills or medicines that cause drowsiness.  Make important decisions or sign legal documents.  Take care of children on your own.  Rest. Eating and drinking   If you vomit, drink water, juice, or soup  when you can drink without vomiting.  Drink enough fluid to keep your urine clear or pale yellow.  Make sure you have little or no nausea  before eating solid foods.  Follow the diet recommended by your health care provider. General instructions   Have a responsible adult stay with you until you are awake and alert.  Return to your normal activities as told by your health care provider. Ask your health care provider what activities are safe for you.  Take over-the-counter and prescription medicines only as told by your health care provider.  If you smoke, do not smoke without supervision.  Keep all follow-up visits as told by your health care provider. This is important. Contact a health care provider if:  You continue to have nausea or vomiting at home, and medicines are not helpful.  You cannot drink fluids or start eating again.  You cannot urinate after 8-12 hours.  You develop a skin rash.  You have fever.  You have increasing redness at the site of your procedure. Get help right away if:  You have difficulty breathing.  You have chest pain.  You have unexpected bleeding.  You feel that you are having a life-threatening or urgent problem. This information is not intended to replace advice given to you by your health care provider. Make sure you discuss any questions you have with your health care provider. Document Released: 11/04/2000 Document Revised: 01/01/2016 Document Reviewed: 07/13/2015 Elsevier Interactive Patient Education  2017 [employer].

## 2016-10-25 ENCOUNTER — Encounter (HOSPITAL_COMMUNITY)
Admission: RE | Admit: 2016-10-25 | Discharge: 2016-10-25 | Disposition: A | Discharge status: Home / Self Care | Payer: Self-pay | Source: Ambulatory Visit | Attending: General Surgery | Admitting: General Surgery

## 2016-10-25 ENCOUNTER — Encounter (HOSPITAL_COMMUNITY): Payer: Self-pay

## 2016-10-25 DIAGNOSIS — I10 Essential (primary) hypertension: Secondary | ICD-10-CM | POA: Insufficient documentation

## 2016-10-25 DIAGNOSIS — K811 Chronic cholecystitis: Secondary | ICD-10-CM | POA: Insufficient documentation

## 2016-10-25 DIAGNOSIS — R1011 Right upper quadrant pain: Secondary | ICD-10-CM | POA: Insufficient documentation

## 2016-10-25 DIAGNOSIS — Z808 Family history of malignant neoplasm of other organs or systems: Secondary | ICD-10-CM | POA: Insufficient documentation

## 2016-10-25 DIAGNOSIS — Z833 Family history of diabetes mellitus: Secondary | ICD-10-CM | POA: Insufficient documentation

## 2016-10-25 DIAGNOSIS — Z794 Long term (current) use of insulin: Secondary | ICD-10-CM | POA: Insufficient documentation

## 2016-10-25 DIAGNOSIS — Z79899 Other long term (current) drug therapy: Secondary | ICD-10-CM | POA: Insufficient documentation

## 2016-10-25 DIAGNOSIS — J9600 Acute respiratory failure, unspecified whether with hypoxia or hypercapnia: Secondary | ICD-10-CM | POA: Insufficient documentation

## 2016-10-25 DIAGNOSIS — Z01812 Encounter for preprocedural laboratory examination: Secondary | ICD-10-CM | POA: Insufficient documentation

## 2016-10-25 DIAGNOSIS — Z91030 Bee allergy status: Secondary | ICD-10-CM | POA: Insufficient documentation

## 2016-10-25 DIAGNOSIS — E119 Type 2 diabetes mellitus without complications: Secondary | ICD-10-CM | POA: Insufficient documentation

## 2016-10-25 HISTORY — DX: Cardiac murmur, unspecified: R01.1

## 2016-10-25 HISTORY — DX: Unspecified osteoarthritis, unspecified site: M19.90

## 2016-10-25 LAB — HEPATIC FUNCTION PANEL
ALBUMIN: 4.3 g/dL (ref 3.5–5.0)
ALK PHOS: 67 U/L (ref 38–126)
ALT: 18 U/L (ref 17–63)
AST: 23 U/L (ref 15–41)
Bilirubin, Direct: 0.1 mg/dL (ref 0.1–0.5)
Indirect Bilirubin: 0.6 mg/dL (ref 0.3–0.9)
TOTAL PROTEIN: 7.7 g/dL (ref 6.5–8.1)
Total Bilirubin: 0.7 mg/dL (ref 0.3–1.2)

## 2016-10-25 LAB — BASIC METABOLIC PANEL
Anion gap: 7 (ref 5–15)
BUN: 25 mg/dL — AB (ref 6–20)
CHLORIDE: 107 mmol/L (ref 101–111)
CO2: 26 mmol/L (ref 22–32)
CREATININE: 1.19 mg/dL (ref 0.61–1.24)
Calcium: 9.1 mg/dL (ref 8.9–10.3)
GFR calc Af Amer: >60 mL/min (ref >60)
GFR calc non Af Amer: >60 mL/min (ref >60)
GLUCOSE: 161 mg/dL — AB (ref 65–99)
Potassium: 4.5 mmol/L (ref 3.5–5.1)
Sodium: 140 mmol/L (ref 135–145)

## 2016-10-25 LAB — CBC WITH DIFFERENTIAL/PLATELET
Basophils Absolute: 0 10*3/uL (ref 0.0–0.1)
Basophils Relative: 0 %
EOS ABS: 0.1 10*3/uL (ref 0.0–0.7)
Eosinophils Relative: 2 %
HEMATOCRIT: 42 % (ref 39.0–52.0)
HEMOGLOBIN: 14 g/dL (ref 13.0–17.0)
LYMPHS ABS: 1.6 10*3/uL (ref 0.7–4.0)
LYMPHS PCT: 23 %
MCH: 28.3 pg (ref 26.0–34.0)
MCHC: 33.3 g/dL (ref 30.0–36.0)
MCV: 85 fL (ref 78.0–100.0)
MONOS PCT: 7 %
Monocytes Absolute: 0.5 10*3/uL (ref 0.1–1.0)
NEUTROS ABS: 4.6 10*3/uL (ref 1.7–7.7)
NEUTROS PCT: 68 %
Platelets: 211 10*3/uL (ref 150–400)
RBC: 4.94 MIL/uL (ref 4.22–5.81)
RDW: 13.5 % (ref 11.5–15.5)
WBC: 6.8 10*3/uL (ref 4.0–10.5)

## 2016-10-29 ENCOUNTER — Encounter: Payer: Self-pay | Admitting: Physical Medicine & Rehabilitation

## 2016-10-29 ENCOUNTER — Ambulatory Visit (HOSPITAL_BASED_OUTPATIENT_CLINIC_OR_DEPARTMENT_OTHER): Payer: Self-pay | Admitting: Physical Medicine & Rehabilitation

## 2016-10-29 VITALS — BP 163/78 | HR 65 | Resp 14

## 2016-10-29 DIAGNOSIS — M25511 Pain in right shoulder: Secondary | ICD-10-CM

## 2016-10-29 DIAGNOSIS — G8929 Other chronic pain: Secondary | ICD-10-CM

## 2016-10-29 NOTE — Progress Notes (Signed)
Subjective:    Patient ID: Jeremy Burgess, male    DOB: [birth year], 51 y.o.   MRN: [id number]  HPI Scheduled for gallbladder surgery tomorrow  Here to discuss shoulder xrays, History of shoulder pain is 02/08/2015 when a tree fell onto his right shoulder  X-rays show no evidence of osteoarthritis, the acromial-clavicular joint was not well visualized No evidence of prior fracture, there is a bone spur coming up off of the greater trochanter at the supraspinatus insertion site No new issues or trauma Pain Inventory Average Pain 8 Pain Right Now 8 My pain is sharp, stabbing and aching  In the last 24 hours, has pain interfered with the following? General activity 0 Relation with others 4 Enjoyment of life 3 What TIME of day is your pain at its worst? all Sleep (in general) Poor  Pain is worse with: walking, bending, sitting and standing Pain improves with: rest, medication and injections Relief from Meds: 1  Mobility walk with assistance use a cane ability to climb steps?  yes  Function disabled: date disabled . I need assistance with the following:  meal prep, household duties and shopping Do you have any goals in this area?  yes  Neuro/Psych weakness numbness tingling trouble walking spasms depression anxiety  Prior Studies Any changes since last visit?  yes x-rays  Physicians involved in your care Any changes since last visit?  no   Family History  Problem Relation Age of Onset  . Diabetes Mother   . COPD Mother   . Diabetes Sister   . Colon cancer Neg Hx    Social History   Social History  . Marital status: Single    Spouse name: N/A  . Number of children: N/A  . Years of education: N/A   Social History Main Topics  . Smoking status: Never Smoker  . Smokeless tobacco: Never Used  . Alcohol use No     Comment: history of alcohol abuse but quit in 2008   . Drug use: No  . Sexual activity: Yes   Other Topics Concern  . None   Social  History Narrative   ** Merged History Encounter **       Past Surgical History:  Procedure Laterality Date  . BACK SURGERY    . COLONOSCOPY N/A 07/25/2016   Dr. [name]: 9 mm tubular adenoma. 5 year surveillance   . ESOPHAGOGASTRODUODENOSCOPY  2009   EGD completed by Dr. [name] due to hematemesis. Findings of normal esophagus, tiny antral erosions, normal D1, D2  . ESOPHAGOGASTRODUODENOSCOPY N/A 05/27/2016   Dr. [name]: normal   . EXTERNAL FIXATION LEG Right 02/09/2015   Procedure: EXTERNAL FIXATION LEG;  Surgeon: [name], MD;  Location: [place];  Service: Orthopedics;  Laterality: Right;  . EXTERNAL FIXATION REMOVAL Right 02/14/2015   Procedure: REMOVAL EXTERNAL FIXATION LEG;  Surgeon: [name], MD;  Location: [place];  Service: Orthopedics;  Laterality: Right;  . FEMUR IM NAIL Right 02/09/2015   Procedure: INTRAMEDULLARY (IM) RETROGRADE FEMORAL NAILING;  Surgeon: [name], MD;  Location: [place];  Service: Orthopedics;  Laterality: Right;  . NOSE SURGERY    . ORIF ANKLE FRACTURE Right 02/14/2015   Procedure: OPEN REDUCTION INTERNAL FIXATION (ORIF) PILON FRACTURE;  Surgeon: [name], MD;  Location: [place];  Service: Orthopedics;  Laterality: Right;  . ORIF ULNAR FRACTURE Right 02/09/2015   Procedure: OPEN REDUCTION INTERNAL FIXATION (ORIF) ULNAR FRACTURE;  Surgeon: [name], MD;  Location: [facility]  OR;  Service: Orthopedics;  Laterality: Right;  . POLYPECTOMY  07/25/2016   Procedure: POLYPECTOMY;  Surgeon: [name], MD;  [facility];  Service: Endoscopy;;  colon   Past Medical History:  Diagnosis Date  . Accidentally struck by falling tree 02/08/2015  . Acute blood loss anemia 02/09/2015  . Acute respiratory failure ([place]) 02/09/2015  . Arthritis   . Chronic pain due to trauma   . Diabetes mellitus   . Diabetes mellitus ([place])   . Heart murmur   . History of blood clots    DVT  . Hypertension   . Pneumothorax   . Postlaminectomy syndrome,  lumbar region 11/13/2015  . Rib fracture   . Smoker 0   denies smoking  . Thrombocytopenia ([place]) 02/09/2015  . Traumatic closed displaced fracture of multiple ribs of right side 02/08/2015  . Traumatic hemopneumothorax 02/09/2015  . Upper GI bleeding 05/27/2016   Trivial. EGD unremarkable.   BP (!) 163/78   Pulse 65   Resp 14   SpO2 98%   Opioid Risk Score:   Fall Risk Score:  `1  Depression screen PHQ 2/9  Depression screen [facility] 2/9 10/14/2016 12/11/2015 11/13/2015  Decreased Interest 3 3 3   Down, Depressed, Hopeless 3 3 3   PHQ - 2 Score 6 6 6   Altered sleeping - - 3  Tired, decreased energy - - 3  Change in appetite - - 3  Feeling bad or failure about yourself  - - 3  Trouble concentrating - - 3  Moving slowly or fidgety/restless - - 3  Suicidal thoughts - - 0  PHQ-9 Score - - 24     Review of Systems  Constitutional: Positive for diaphoresis and unexpected weight change.  HENT: Negative.   Eyes: Negative.   Respiratory: Positive for apnea.   Cardiovascular: Positive for leg swelling.  Gastrointestinal: Positive for abdominal pain.  Endocrine:       High blood sugar  Genitourinary: Negative.   Musculoskeletal: Positive for arthralgias, back pain, gait problem and myalgias.  Skin: Negative.   Allergic/Immunologic: Negative.   Neurological: Positive for weakness and numbness.       Tingling   Hematological: Negative.   Psychiatric/Behavioral: Positive for dysphoric mood. The patient is nervous/anxious.        Objective:   Physical Exam  Constitutional: He is oriented to person, place, and time. He appears well-developed and well-nourished.  Neck: Normal range of motion.  Neurological: He is alert and oriented to person, place, and time.  Psychiatric: He has a normal mood and affect.  Nursing note and vitals reviewed.   Tenderness. Palpation over the right acromioclavicular joint, pain with crossed adduction, no step-offs palpated, no tenderness along the lid of the  colon. The right side. Positive impingement sign on the right. Right upper extremity strength deltoid 4, otherwise 5/5 in the biceps, triceps, grip       Assessment & Plan:  1. Chronic right shoulder pain, post traumatic, x-rays show no obvious pathology, exam most consistent with acromioclavicular pain, schedule for ultrasound-guided right acromioclavicular joint injection. If this is not helpful, would consider either musculoskeletal ultrasound of the right shoulder or MRI Patient will have this performed in 3 weeks, he will be undergoing laparoscopic cholecystectomy tomorrow

## 2016-10-29 NOTE — Patient Instructions (Signed)
Good luck on your surgery tomorrow  We will schedule an ultrasound-guided acromioclavicular joint injection in 3 weeks. If that is not helpful, we may have to do some more advanced imaging to look at the rotator cuff

## 2016-10-30 ENCOUNTER — Encounter (HOSPITAL_COMMUNITY)
Admission: RE | Disposition: A | Discharge status: Home / Self Care | Payer: Self-pay | Source: Ambulatory Visit | Attending: General Surgery

## 2016-10-30 ENCOUNTER — Encounter (HOSPITAL_COMMUNITY): Payer: Self-pay

## 2016-10-30 ENCOUNTER — Ambulatory Visit (HOSPITAL_COMMUNITY): Payer: Self-pay | Admitting: Anesthesiology

## 2016-10-30 ENCOUNTER — Ambulatory Visit (HOSPITAL_COMMUNITY)
Admission: RE | Admit: 2016-10-30 | Discharge: 2016-10-30 | Disposition: A | Discharge status: Home / Self Care | Payer: Self-pay | Source: Ambulatory Visit | Attending: General Surgery | Admitting: General Surgery

## 2016-10-30 DIAGNOSIS — I10 Essential (primary) hypertension: Secondary | ICD-10-CM | POA: Insufficient documentation

## 2016-10-30 DIAGNOSIS — K8020 Calculus of gallbladder without cholecystitis without obstruction: Secondary | ICD-10-CM

## 2016-10-30 DIAGNOSIS — K8010 Calculus of gallbladder with chronic cholecystitis without obstruction: Secondary | ICD-10-CM | POA: Insufficient documentation

## 2016-10-30 DIAGNOSIS — Z791 Long term (current) use of non-steroidal anti-inflammatories (NSAID): Secondary | ICD-10-CM | POA: Insufficient documentation

## 2016-10-30 DIAGNOSIS — Z86718 Personal history of other venous thrombosis and embolism: Secondary | ICD-10-CM | POA: Insufficient documentation

## 2016-10-30 DIAGNOSIS — Z794 Long term (current) use of insulin: Secondary | ICD-10-CM | POA: Insufficient documentation

## 2016-10-30 DIAGNOSIS — E119 Type 2 diabetes mellitus without complications: Secondary | ICD-10-CM | POA: Insufficient documentation

## 2016-10-30 DIAGNOSIS — Z79899 Other long term (current) drug therapy: Secondary | ICD-10-CM | POA: Insufficient documentation

## 2016-10-30 HISTORY — PX: CHOLECYSTECTOMY: SHX55

## 2016-10-30 LAB — GLUCOSE, CAPILLARY
Glucose-Capillary: 104 mg/dL — ABNORMAL HIGH (ref 65–99)
Glucose-Capillary: 129 mg/dL — ABNORMAL HIGH (ref 65–99)

## 2016-10-30 SURGERY — LAPAROSCOPIC CHOLECYSTECTOMY
Anesthesia: General

## 2016-10-30 MED ORDER — HEMOSTATIC AGENTS (NO CHARGE) OPTIME
TOPICAL | Status: DC | PRN
  Administered 2016-10-30 (×2): 1 via TOPICAL

## 2016-10-30 MED ORDER — HYDROMORPHONE HCL 1 MG/ML IJ SOLN
INTRAMUSCULAR | Status: AC
  Filled 2016-10-30: qty 1

## 2016-10-30 MED ORDER — CIPROFLOXACIN IN D5W 400 MG/200ML IV SOLN
400.0000 mg | INTRAVENOUS | Status: AC
  Administered 2016-10-30: 400 mg via INTRAVENOUS

## 2016-10-30 MED ORDER — CHLORHEXIDINE GLUCONATE CLOTH 2 % EX PADS: 6.0000 | MEDICATED_PAD | Freq: Once | CUTANEOUS | Status: DC

## 2016-10-30 MED ORDER — SUCCINYLCHOLINE CHLORIDE 20 MG/ML IJ SOLN
INTRAMUSCULAR | Status: AC
  Filled 2016-10-30: qty 1

## 2016-10-30 MED ORDER — HYDROMORPHONE HCL 1 MG/ML IJ SOLN
INTRAMUSCULAR | Status: AC
Start: 2016-10-30 — End: ?
  Filled 2016-10-30: qty 1

## 2016-10-30 MED ORDER — BUPIVACAINE HCL (PF) 0.5 % IJ SOLN
INTRAMUSCULAR | Status: AC
  Filled 2016-10-30: qty 30

## 2016-10-30 MED ORDER — HYDROMORPHONE HCL 1 MG/ML IJ SOLN
0.2500 mg | INTRAMUSCULAR | Status: DC | PRN
  Administered 2016-10-30 (×5): 0.5 mg via INTRAVENOUS

## 2016-10-30 MED ORDER — KETOROLAC TROMETHAMINE 30 MG/ML IJ SOLN
INTRAMUSCULAR | Status: AC
  Filled 2016-10-30: qty 1

## 2016-10-30 MED ORDER — LIDOCAINE HCL (PF) 1 % IJ SOLN
INTRAMUSCULAR | Status: AC
  Filled 2016-10-30: qty 5

## 2016-10-30 MED ORDER — HYDROCODONE-ACETAMINOPHEN 5-325 MG PO TABS: 1.0000 | ORAL_TABLET | Freq: Four times a day (QID) | ORAL | 0 refills | Status: DC | PRN

## 2016-10-30 MED ORDER — HYDROMORPHONE HCL 1 MG/ML IJ SOLN: 0.2500 mg | INTRAMUSCULAR | Status: DC | PRN

## 2016-10-30 MED ORDER — POVIDONE-IODINE 10 % EX OINT
TOPICAL_OINTMENT | CUTANEOUS | Status: AC
  Filled 2016-10-30: qty 1

## 2016-10-30 MED ORDER — POVIDONE-IODINE 10 % OINT PACKET
TOPICAL_OINTMENT | CUTANEOUS | Status: DC | PRN
  Administered 2016-10-30: 1 via TOPICAL

## 2016-10-30 MED ORDER — SEVOFLURANE IN SOLN
RESPIRATORY_TRACT | Status: AC
Start: 2016-10-30 — End: ?
  Filled 2016-10-30: qty 250

## 2016-10-30 MED ORDER — GLYCOPYRROLATE 0.2 MG/ML IJ SOLN
INTRAMUSCULAR | Status: AC
  Filled 2016-10-30: qty 3

## 2016-10-30 MED ORDER — PROPOFOL 10 MG/ML IV BOLUS
INTRAVENOUS | Status: AC
  Filled 2016-10-30: qty 40

## 2016-10-30 MED ORDER — GLYCOPYRROLATE 0.2 MG/ML IJ SOLN
INTRAMUSCULAR | Status: DC | PRN
  Administered 2016-10-30: 0.6 mg via INTRAVENOUS

## 2016-10-30 MED ORDER — ROCURONIUM BROMIDE 100 MG/10ML IV SOLN
INTRAVENOUS | Status: DC | PRN
  Administered 2016-10-30: 5 mg via INTRAVENOUS
  Administered 2016-10-30: 40 mg via INTRAVENOUS

## 2016-10-30 MED ORDER — LIDOCAINE HCL (PF) 1 % IJ SOLN
INTRAMUSCULAR | Status: AC
  Filled 2016-10-30: qty 20

## 2016-10-30 MED ORDER — ONDANSETRON HCL 4 MG/2ML IJ SOLN
4.0000 mg | Freq: Once | INTRAMUSCULAR | Status: AC
  Administered 2016-10-30: 4 mg via INTRAVENOUS

## 2016-10-30 MED ORDER — LIDOCAINE HCL (CARDIAC) 10 MG/ML IV SOLN
INTRAVENOUS | Status: DC | PRN
  Administered 2016-10-30: 50 mg via INTRAVENOUS

## 2016-10-30 MED ORDER — ONDANSETRON HCL 4 MG/2ML IJ SOLN
INTRAMUSCULAR | Status: AC
  Filled 2016-10-30: qty 2

## 2016-10-30 MED ORDER — LACTATED RINGERS IV SOLN
INTRAVENOUS | Status: DC
  Administered 2016-10-30: 09:00:00 via INTRAVENOUS
  Administered 2016-10-30: 1000 mL via INTRAVENOUS

## 2016-10-30 MED ORDER — ROCURONIUM BROMIDE 50 MG/5ML IV SOLN
INTRAVENOUS | Status: AC
  Filled 2016-10-30: qty 1

## 2016-10-30 MED ORDER — CIPROFLOXACIN IN D5W 400 MG/200ML IV SOLN
INTRAVENOUS | Status: AC
  Filled 2016-10-30: qty 200

## 2016-10-30 MED ORDER — NEOSTIGMINE METHYLSULFATE 10 MG/10ML IV SOLN
INTRAVENOUS | Status: AC
  Filled 2016-10-30: qty 1

## 2016-10-30 MED ORDER — NEOSTIGMINE METHYLSULFATE 10 MG/10ML IV SOLN
INTRAVENOUS | Status: DC | PRN
  Administered 2016-10-30: 3 mg via INTRAVENOUS

## 2016-10-30 MED ORDER — KETOROLAC TROMETHAMINE 30 MG/ML IJ SOLN
30.0000 mg | Freq: Once | INTRAMUSCULAR | Status: AC
  Administered 2016-10-30: 30 mg via INTRAVENOUS

## 2016-10-30 MED ORDER — MIDAZOLAM HCL 2 MG/2ML IJ SOLN
1.0000 mg | INTRAMUSCULAR | Status: AC
  Administered 2016-10-30: 2 mg via INTRAVENOUS

## 2016-10-30 MED ORDER — SODIUM CHLORIDE 0.9 % IR SOLN
Status: DC | PRN
  Administered 2016-10-30: 1000 mL

## 2016-10-30 MED ORDER — PROPOFOL 10 MG/ML IV BOLUS
INTRAVENOUS | Status: DC | PRN
  Administered 2016-10-30: 50 mg via INTRAVENOUS
  Administered 2016-10-30: 150 mg via INTRAVENOUS

## 2016-10-30 MED ORDER — FENTANYL CITRATE (PF) 100 MCG/2ML IJ SOLN
INTRAMUSCULAR | Status: DC | PRN
  Administered 2016-10-30 (×2): 50 ug via INTRAVENOUS
  Administered 2016-10-30: 25 ug via INTRAVENOUS
  Administered 2016-10-30: 50 ug via INTRAVENOUS
  Administered 2016-10-30 (×3): 25 ug via INTRAVENOUS

## 2016-10-30 MED ORDER — FENTANYL CITRATE (PF) 250 MCG/5ML IJ SOLN
INTRAMUSCULAR | Status: AC
Start: 2016-10-30 — End: ?
  Filled 2016-10-30: qty 5

## 2016-10-30 MED ORDER — BUPIVACAINE HCL (PF) 0.5 % IJ SOLN
INTRAMUSCULAR | Status: DC | PRN
  Administered 2016-10-30: 10 mL

## 2016-10-30 MED ORDER — MIDAZOLAM HCL 2 MG/2ML IJ SOLN
INTRAMUSCULAR | Status: AC
  Filled 2016-10-30: qty 2

## 2016-10-30 SURGICAL SUPPLY — 48 items
APL SRG 38 LTWT LNG FL B (MISCELLANEOUS) ×1
APPLICATOR ARISTA FLEXITIP XL (MISCELLANEOUS) ×2 IMPLANT
APPLIER CLIP LAPSCP 10X32 DD (CLIP) ×3 IMPLANT
BAG HAMPER (MISCELLANEOUS) ×3 IMPLANT
BAG RETRIEVAL 10 (BASKET) ×1
BAG RETRIEVAL 10MM (BASKET) ×1
CHLORAPREP W/TINT 26ML (MISCELLANEOUS) ×3 IMPLANT
CLOTH BEACON ORANGE TIMEOUT ST (SAFETY) ×3 IMPLANT
COVER LIGHT HANDLE STERIS (MISCELLANEOUS) ×6 IMPLANT
DECANTER SPIKE VIAL GLASS SM (MISCELLANEOUS) ×3 IMPLANT
ELECT REM PT RETURN 9FT ADLT (ELECTROSURGICAL) ×3
ELECTRODE REM PT RTRN 9FT ADLT (ELECTROSURGICAL) ×1 IMPLANT
FILTER SMOKE EVAC LAPAROSHD (FILTER) ×3 IMPLANT
FORMALIN 10 PREFIL 120ML (MISCELLANEOUS) ×3 IMPLANT
GLOVE BIOGEL PI IND STRL 7.0 (GLOVE) ×1 IMPLANT
GLOVE BIOGEL PI INDICATOR 7.0 (GLOVE) ×2
GLOVE SURG SS PI 7.5 STRL IVOR (GLOVE) ×3 IMPLANT
GOWN STRL REUS W/ TWL XL LVL3 (GOWN DISPOSABLE) ×1 IMPLANT
GOWN STRL REUS W/TWL LRG LVL3 (GOWN DISPOSABLE) ×6 IMPLANT
GOWN STRL REUS W/TWL XL LVL3 (GOWN DISPOSABLE) ×3
HEMOSTAT ARISTA ABSORB 3G PWDR (MISCELLANEOUS) ×2 IMPLANT
HEMOSTAT SNOW SURGICEL 2X4 (HEMOSTASIS) ×3 IMPLANT
INST SET LAPROSCOPIC AP (KITS) ×3 IMPLANT
IV NS IRRIG 3000ML ARTHROMATIC (IV SOLUTION) IMPLANT
KIT ROOM TURNOVER APOR (KITS) ×3 IMPLANT
MANIFOLD NEPTUNE II (INSTRUMENTS) ×3 IMPLANT
NDL INSUFFLATION 14GA 120MM (NEEDLE) ×1 IMPLANT
NEEDLE INSUFFLATION 14GA 120MM (NEEDLE) ×3 IMPLANT
NS IRRIG 1000ML POUR BTL (IV SOLUTION) ×3 IMPLANT
PACK LAP CHOLE LZT030E (CUSTOM PROCEDURE TRAY) ×3 IMPLANT
PAD ARMBOARD 7.5X6 YLW CONV (MISCELLANEOUS) ×3 IMPLANT
SET BASIN LINEN APH (SET/KITS/TRAYS/PACK) ×3 IMPLANT
SET TUBE IRRIG SUCTION NO TIP (IRRIGATION / IRRIGATOR) IMPLANT
SLEEVE ENDOPATH XCEL 5M (ENDOMECHANICALS) ×3 IMPLANT
SPONGE GAUZE 2X2 8PLY STER LF (GAUZE/BANDAGES/DRESSINGS) ×1
SPONGE GAUZE 2X2 8PLY STRL LF (GAUZE/BANDAGES/DRESSINGS) ×5 IMPLANT
STAPLER VISISTAT (STAPLE) ×3 IMPLANT
SUT VICRYL 0 UR6 27IN ABS (SUTURE) ×3 IMPLANT
SYS BAG RETRIEVAL 10MM (BASKET) ×1
SYSTEM BAG RETRIEVAL 10MM (BASKET) ×1 IMPLANT
TAPE PAPER 3X10 WHT MICROPORE (GAUZE/BANDAGES/DRESSINGS) ×2 IMPLANT
TROCAR ENDO BLADELESS 11MM (ENDOMECHANICALS) ×3 IMPLANT
TROCAR XCEL NON-BLD 5MMX100MML (ENDOMECHANICALS) ×3 IMPLANT
TROCAR XCEL UNIV SLVE 11M 100M (ENDOMECHANICALS) ×3 IMPLANT
TUBE CONNECTING 12'X1/4 (SUCTIONS) ×1
TUBE CONNECTING 12X1/4 (SUCTIONS) ×2 IMPLANT
TUBING INSUFFLATION (TUBING) ×3 IMPLANT
WARMER LAPAROSCOPE (MISCELLANEOUS) ×3 IMPLANT

## 2016-10-30 NOTE — Anesthesia Procedure Notes (Signed)
Procedure Name: Intubation Date/Time: 10/30/2016 7:39 AM Performed by: Vista Deck Pre-anesthesia Checklist: Patient identified, Patient being monitored, Timeout performed, Emergency Drugs available and Suction available Patient Re-evaluated:Patient Re-evaluated prior to inductionOxygen Delivery Method: Circle System Utilized Preoxygenation: Pre-oxygenation with 100% oxygen Intubation Type: IV induction Ventilation: Mask ventilation without difficulty Laryngoscope Size: Miller and 2 Grade View: Grade I Tube type: Oral Tube size: 7.0 mm Number of attempts: 1 Airway Equipment and Method: Stylet and Oral airway Placement Confirmation: ETT inserted through vocal cords under direct vision,  positive ETCO2 and breath sounds checked- equal and bilateral Secured at: 21 cm Tube secured with: Tape Dental Injury: Teeth and Oropharynx as per pre-operative assessment

## 2016-10-30 NOTE — Anesthesia Postprocedure Evaluation (Signed)
Anesthesia Post Note  Patient: Jeremy Burgess  Procedure(s) Performed: Procedure(s) (LRB): LAPAROSCOPIC CHOLECYSTECTOMY (N/A)  Patient location during evaluation: PACU Anesthesia Type: General Level of consciousness: awake Pain management: satisfactory to patient Vital Signs Assessment: post-procedure vital signs reviewed and stable Respiratory status: spontaneous breathing Cardiovascular status: stable Anesthetic complications: no     Last Vitals:  Vitals:   10/30/16 0945 10/30/16 0947  BP: (!) 180/90 (!) 176/99  Pulse: 68 71  Resp: 18 16  Temp:  36.6 C    Last Pain:  Vitals:   10/30/16 0947  TempSrc: Oral  PainSc: 2                  Tarin Johndrow

## 2016-10-30 NOTE — Anesthesia Preprocedure Evaluation (Signed)
Anesthesia Evaluation  Patient identified by MRN, date of birth, ID band Patient awake    Reviewed: Allergy & Precautions, NPO status , Patient's Chart, lab work & pertinent test results  Airway Mallampati: II  TM Distance: >3 FB     Dental  (+) Poor Dentition, Missing, Chipped, Dental Advisory Given   Pulmonary neg pulmonary ROS,    breath sounds clear to auscultation       Cardiovascular hypertension, Pt. on medications  Rhythm:Regular Rate:Normal     Neuro/Psych    GI/Hepatic negative GI ROS,   Endo/Other  diabetes, Type 2, Insulin Dependent  Renal/GU      Musculoskeletal  (+) Arthritis ,   Abdominal   Peds  Hematology   Anesthesia Other Findings   Reproductive/Obstetrics                             Anesthesia Physical Anesthesia Plan  ASA: III  Anesthesia Plan: General   Post-op Pain Management:    Induction: Intravenous, Rapid sequence and Cricoid pressure planned  Airway Management Planned: Oral ETT  Additional Equipment:   Intra-op Plan:   Post-operative Plan: Extubation in OR  Informed Consent: I have reviewed the patients History and Physical, chart, labs and discussed the procedure including the risks, benefits and alternatives for the proposed anesthesia with the patient or authorized representative who has indicated his/her understanding and acceptance.     Plan Discussed with:   Anesthesia Plan Comments:         Anesthesia Quick Evaluation

## 2016-10-30 NOTE — Transfer of Care (Signed)
Immediate Anesthesia Transfer of Care Note  Patient: Jeremy Burgess  Procedure(s) Performed: Procedure(s): LAPAROSCOPIC CHOLECYSTECTOMY (N/A)  Patient Location: PACU  Anesthesia Type:General  Level of Consciousness: awake and patient cooperative  Airway & Oxygen Therapy: Patient Spontanous Breathing and Patient connected to nasal cannula oxygen  Post-op Assessment: Report given to RN and Post -op Vital signs reviewed and stable  Post vital signs: Reviewed  Last Vitals:  Vitals:   10/30/16 0720 10/30/16 0725  BP: 138/79   Pulse:    Resp: 16 19  Temp:      Last Pain:  Vitals:   10/30/16 0634  TempSrc: Oral  PainSc: 3       Patients Stated Pain Goal: 8 (42/68/34 1962)  Complications: No apparent anesthesia complications

## 2016-10-30 NOTE — Interval H&P Note (Signed)
History and Physical Interval Note:  10/30/2016 7:11 AM  Jeremy Burgess  has presented today for surgery, with the diagnosis of chronic cholecytitis  The various methods of treatment have been discussed with the patient and family. After consideration of risks, benefits and other options for treatment, the patient has consented to  Procedure(s): LAPAROSCOPIC CHOLECYSTECTOMY (N/A) as a surgical intervention .  The patient's history has been reviewed, patient examined, no change in status, stable for surgery.  I have reviewed the patient's chart and labs.  Questions were answered to the patient's satisfaction.     [name]

## 2016-10-30 NOTE — Op Note (Signed)
Patient:  Jeremy Burgess  DOB:  [birth year]  MRN:  [id number]   Preop Diagnosis:  Chronic cholecystitis  Postop Diagnosis:  Same, cholelithiasis  Procedure:  Laparoscopic cholecystectomy  Surgeon:  [name], M.D.  Anes:  Gen. endotracheal  Indications:  Patient is a 51 year old white male who presents with biliary colic secondary to chronic cholecystitis. The risks and benefits of the procedure including bleeding, infection, hepatobiliary injury, and the possibility of an open procedure were fully explained to the patient, who gave informed consent.  Procedure note:  The patient was placed in the supine position. After induction of general endotracheal anesthesia, the abdomen was prepped and draped using the usual sterile technique with DuraPrep. Surgical site confirmation was performed.  A supraumbilical incision was made down to the fascia. A Veress needle was introduced into the abdominal cavity and confirmation of placement was done using the saline drop test. The abdomen was then insufflated to 16 mmHg pressure. An 11 mm trocar was introduced into the abdominal cavity under direct visualization without difficulty. The patient was placed in reverse Trendelenburg position and an additional 11 mm trocar was placed the epigastric region and 5 mm trochars were placed the right upper quadrant and right flank regions. The liver was inspected and noted to be within normal limits. The gallbladder was noted to be contracted with stones. The gallbladder was retracted in a dynamic fashion in order to provide a critical view of the triangle of Calot. The cystic duct was first identified. Its juncture to the infundibulum was fully identified. Endoclips were placed proximally and distally on the cystic duct, and the cystic duct was divided. This was likewise done to the cystic artery. The gallbladder was freed away from the gallbladder fossa using Bovie electrocautery. The gallbladder was delivered  through the epigastric trocar site using an Endo Catch bag. The gallbladder fossa was inspected and one area of bleeding was controlled using Bovie electrocautery and clips. Arista and Surgicel were placed the gallbladder fossa. All fluid and air were then evacuated from the abdominal cavity prior to the removal of the trochars.  All wounds were irrigated with normal saline. All wounds were injected with 0.5% Sensorcaine. The supraumbilical fascia was reapproximated using an 0 Vicryl interrupted suture. All skin incisions were closed using staples. Betadine ointment and dry sterile dressings were applied.  All tape and needle counts were correct at the end of procedure. The patient was extubated in the operating room and transferred to PACU in stable condition.  Complications:  None  EBL:  200 mL  Specimen:  Gallbladder

## 2016-10-30 NOTE — Discharge Instructions (Signed)
Laparoscopic Cholecystectomy, Care After °This sheet gives you information about how to care for yourself after your procedure. Your health care provider may also give you more specific instructions. If you have problems or questions, contact your health care provider. °What can I expect after the procedure? °After the procedure, it is common to have: °· Pain at your incision sites. You will be given medicines to control this pain. °· Mild nausea or vomiting. °· Bloating and possible shoulder pain from the air-like gas that was used during the procedure. °Follow these instructions at home: °Incision care  ° °· Follow instructions from your health care provider about how to take care of your incisions. Make sure you: °¨ Wash your hands with soap and water before you change your bandage (dressing). If soap and water are not available, use hand sanitizer. °¨ Change your dressing as told by your health care provider. °¨ Leave stitches (sutures), skin glue, or adhesive strips in place. These skin closures may need to be in place for 2 weeks or longer. If adhesive strip edges start to loosen and curl up, you may trim the loose edges. Do not remove adhesive strips completely unless your health care provider tells you to do that. °· Do not take baths, swim, or use a hot tub until your health care provider approves. Ask your health care provider if you can take showers. You may only be allowed to take sponge baths for bathing. °· Check your incision area every day for signs of infection. Check for: °¨ More redness, swelling, or pain. °¨ More fluid or blood. °¨ Warmth. °¨ Pus or a bad smell. °Activity  °· Do not drive or use heavy machinery while taking prescription pain medicine. °· Do not lift anything that is heavier than 10 lb (4.5 kg) until your health care provider approves. °· Do not play contact sports until your health care provider approves. °· Do not drive for 24 hours if you were given a medicine to help you relax  (sedative). °· Rest as needed. Do not return to work or school until your health care provider approves. °General instructions  °· Take over-the-counter and prescription medicines only as told by your health care provider. °· To prevent or treat constipation while you are taking prescription pain medicine, your health care provider may recommend that you: °¨ Drink enough fluid to keep your urine clear or pale yellow. °¨ Take over-the-counter or prescription medicines. °¨ Eat foods that are high in fiber, such as fresh fruits and vegetables, whole grains, and beans. °¨ Limit foods that are high in fat and processed sugars, such as fried and sweet foods. °Contact a health care provider if: °· You develop a rash. °· You have more redness, swelling, or pain around your incisions. °· You have more fluid or blood coming from your incisions. °· Your incisions feel warm to the touch. °· You have pus or a bad smell coming from your incisions. °· You have a fever. °· One or more of your incisions breaks open. °Get help right away if: °· You have trouble breathing. °· You have chest pain. °· You have increasing pain in your shoulders. °· You faint or feel dizzy when you stand. °· You have severe pain in your abdomen. °· You have nausea or vomiting that lasts for more than one day. °· You have leg pain. °This information is not intended to replace advice given to you by your health care provider. Make sure you discuss any   have with your health care provider. °Document Released: 07/29/2005 Document Revised: 02/17/2016 Document Reviewed: 01/15/2016 °Elsevier Interactive Patient Education © 2017 [employer] ° ° °PATIENT INSTRUCTIONS °POST-ANESTHESIA ° °IMMEDIATELY FOLLOWING SURGERY:  Do not drive or operate machinery for the first twenty four hours after surgery.  Do not make any important decisions for twenty four hours after surgery or while taking narcotic pain medications or sedatives.  If you develop  intractable nausea and vomiting or a severe headache please notify your doctor immediately. ° °FOLLOW-UP:  Please make an appointment with your surgeon as instructed. You do not need to follow up with anesthesia unless specifically instructed to do so. ° °WOUND CARE INSTRUCTIONS (if applicable):  Keep a dry clean dressing on the anesthesia/puncture wound site if there is drainage.  Once the wound has quit draining you may leave it open to air.  Generally you should leave the bandage intact for twenty four hours unless there is drainage.  If the epidural site drains for more than 36-48 hours please call the anesthesia department. ° °QUESTIONS?:  Please feel free to call your physician or the hospital operator if you have any questions, and they will be happy to assist you.    ° ° ° °

## 2016-11-01 ENCOUNTER — Encounter (HOSPITAL_COMMUNITY): Payer: Self-pay | Admitting: General Surgery

## 2016-11-07 ENCOUNTER — Encounter: Payer: Self-pay | Admitting: General Surgery

## 2016-11-07 ENCOUNTER — Ambulatory Visit (INDEPENDENT_AMBULATORY_CARE_PROVIDER_SITE_OTHER): Payer: Self-pay | Admitting: General Surgery

## 2016-11-07 VITALS — BP 157/71 | HR 71 | Temp 98.9°F | Resp 18 | Ht 70.0 in | Wt 242.0 lb

## 2016-11-07 DIAGNOSIS — Z09 Encounter for follow-up examination after completed treatment for conditions other than malignant neoplasm: Secondary | ICD-10-CM

## 2016-11-07 NOTE — Progress Notes (Signed)
Subjective:     Jeremy Burgess  Status post laparoscopic cholecystectomy. Doing well. Patient has no complaints. Objective:    BP (!) 157/71   Pulse 71   Temp 98.9 F (37.2 C)   Resp 18   Ht 5\' 10"  (1.778 m)   Wt 242 lb (109.8 kg)   BMI 34.72 kg/m   General:  alert, cooperative and no distress  Abdomen soft, incisions healing well. Staples removed, Steri-Strips applied.     Assessment:    Doing well postoperatively.    Plan:   Increase activity as able. Follow-up as needed.

## 2016-11-19 ENCOUNTER — Encounter: Payer: Self-pay | Attending: Physical Medicine & Rehabilitation

## 2016-11-19 ENCOUNTER — Ambulatory Visit (HOSPITAL_BASED_OUTPATIENT_CLINIC_OR_DEPARTMENT_OTHER): Payer: Self-pay | Admitting: Physical Medicine & Rehabilitation

## 2016-11-19 ENCOUNTER — Encounter: Payer: Self-pay | Admitting: Physical Medicine & Rehabilitation

## 2016-11-19 VITALS — BP 170/73 | HR 66

## 2016-11-19 DIAGNOSIS — M5030 Other cervical disc degeneration, unspecified cervical region: Secondary | ICD-10-CM | POA: Insufficient documentation

## 2016-11-19 DIAGNOSIS — M4316 Spondylolisthesis, lumbar region: Secondary | ICD-10-CM | POA: Insufficient documentation

## 2016-11-19 DIAGNOSIS — G8929 Other chronic pain: Secondary | ICD-10-CM | POA: Insufficient documentation

## 2016-11-19 DIAGNOSIS — M25511 Pain in right shoulder: Secondary | ICD-10-CM

## 2016-11-19 DIAGNOSIS — R0781 Pleurodynia: Secondary | ICD-10-CM | POA: Insufficient documentation

## 2016-11-19 DIAGNOSIS — Z9889 Other specified postprocedural states: Secondary | ICD-10-CM | POA: Insufficient documentation

## 2016-11-19 NOTE — Patient Instructions (Signed)
Avoid overhead lifting Right arm for 1 week

## 2016-11-19 NOTE — Progress Notes (Signed)
Acromioclavicular joint injection under ultrasound guidance  Indication is for anterior shoulder pain with imaging studies demonstrating acromioclavicular joint arthropathy Pain is only partially responsive to medication management and other conservative care. Pain interferes with ADLs and sleep  The patient was placed in a seated position the acromioclavicular joint was scanned in the long axis view, areas marked prepped with Betadine, sterile technique was utilized. Under direct long axis view 1% lidocaine was infiltrated to the skin and subcutaneous tissues. A 25-gauge 1.5 inch needle reached the acromioclavicular joint space. Then a solution containing 1 mL of lidocaine 1% and 0.5 ML of Celestone 6 mg per mL was injected. Patient tolerated procedure well. Images were saved. Postprocedure instructions given

## 2016-11-21 ENCOUNTER — Encounter: Payer: Self-pay | Admitting: Physical Medicine & Rehabilitation

## 2016-12-31 ENCOUNTER — Ambulatory Visit: Payer: Self-pay | Admitting: Physical Medicine & Rehabilitation

## 2017-02-03 ENCOUNTER — Encounter: Payer: Self-pay | Admitting: Physical Medicine & Rehabilitation

## 2017-02-03 ENCOUNTER — Encounter: Payer: Self-pay | Attending: Physical Medicine & Rehabilitation

## 2017-02-03 ENCOUNTER — Ambulatory Visit (HOSPITAL_BASED_OUTPATIENT_CLINIC_OR_DEPARTMENT_OTHER): Payer: Self-pay | Admitting: Physical Medicine & Rehabilitation

## 2017-02-03 VITALS — BP 139/73 | HR 66 | Resp 14

## 2017-02-03 DIAGNOSIS — G8929 Other chronic pain: Secondary | ICD-10-CM | POA: Insufficient documentation

## 2017-02-03 DIAGNOSIS — Z9889 Other specified postprocedural states: Secondary | ICD-10-CM | POA: Insufficient documentation

## 2017-02-03 DIAGNOSIS — M5030 Other cervical disc degeneration, unspecified cervical region: Secondary | ICD-10-CM | POA: Insufficient documentation

## 2017-02-03 DIAGNOSIS — M4316 Spondylolisthesis, lumbar region: Secondary | ICD-10-CM | POA: Insufficient documentation

## 2017-02-03 DIAGNOSIS — M5442 Lumbago with sciatica, left side: Secondary | ICD-10-CM

## 2017-02-03 DIAGNOSIS — R0781 Pleurodynia: Secondary | ICD-10-CM | POA: Insufficient documentation

## 2017-02-03 MED ORDER — MELOXICAM 15 MG PO TABS: 15.0000 mg | ORAL_TABLET | Freq: Every day | ORAL | 0 refills | Status: DC

## 2017-02-03 NOTE — Patient Instructions (Signed)
Stay active as possible  Please use heating pad about 20 minutes several times a day  Go buy some icy hot and use this 3 times per day  Get x-rays at [facility]  See me in one month. If not much better. Will need MRI
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
## 2017-02-11 ENCOUNTER — Ambulatory Visit (HOSPITAL_COMMUNITY)
Admission: RE | Admit: 2017-02-11 | Discharge: 2017-02-11 | Disposition: A | Discharge status: Home / Self Care | Payer: Self-pay | Source: Ambulatory Visit | Attending: Physical Medicine & Rehabilitation | Admitting: Physical Medicine & Rehabilitation

## 2017-02-11 DIAGNOSIS — M5136 Other intervertebral disc degeneration, lumbar region: Secondary | ICD-10-CM | POA: Insufficient documentation

## 2017-02-11 DIAGNOSIS — M5442 Lumbago with sciatica, left side: Secondary | ICD-10-CM | POA: Insufficient documentation

## 2017-03-07 ENCOUNTER — Ambulatory Visit: Payer: Self-pay | Admitting: Physical Medicine & Rehabilitation

## 2017-03-14 ENCOUNTER — Encounter: Payer: Self-pay | Admitting: Physical Medicine & Rehabilitation

## 2017-03-14 ENCOUNTER — Encounter: Payer: No Typology Code available for payment source | Attending: Physical Medicine & Rehabilitation

## 2017-03-14 ENCOUNTER — Ambulatory Visit (HOSPITAL_BASED_OUTPATIENT_CLINIC_OR_DEPARTMENT_OTHER): Payer: No Typology Code available for payment source | Admitting: Physical Medicine & Rehabilitation

## 2017-03-14 VITALS — BP 148/87 | HR 62 | Resp 14

## 2017-03-14 DIAGNOSIS — Z9889 Other specified postprocedural states: Secondary | ICD-10-CM | POA: Insufficient documentation

## 2017-03-14 DIAGNOSIS — M259 Joint disorder, unspecified: Secondary | ICD-10-CM

## 2017-03-14 DIAGNOSIS — M5030 Other cervical disc degeneration, unspecified cervical region: Secondary | ICD-10-CM | POA: Insufficient documentation

## 2017-03-14 DIAGNOSIS — G8929 Other chronic pain: Secondary | ICD-10-CM | POA: Insufficient documentation

## 2017-03-14 DIAGNOSIS — R0781 Pleurodynia: Secondary | ICD-10-CM | POA: Insufficient documentation

## 2017-03-14 DIAGNOSIS — M4316 Spondylolisthesis, lumbar region: Secondary | ICD-10-CM | POA: Insufficient documentation

## 2017-03-14 NOTE — Patient Instructions (Signed)
You will need a driver for the injection next visit  Physical therapy from [facility] should call you to schedule appointments to start working on your back. They can also work on your neck.

## 2017-03-14 NOTE — Progress Notes (Signed)
Subjective:    Patient ID: Jeremy Burgess, male    DOB: [birth year], 51 y.o.   MRN: [id number]  HPI 51 year old male with history of multitrauma secondary to a tree limb falling on his right shoulder June 2016 Workup revealed right displaced 2 through 9 rib fractures, moderate right pneumothorax, right femoral shaft fracture, right pilon fracture, and right ulnar fracture.  Has had chronic pain since that time, mainly in the right shoulder and right hip area. In addition, he has been developing increasing low back pain. This is mainly left-sided. No recent trauma. Has been taking anti-inflammatory medications. Was evaluated at last visit, x-rays have been taken on 02/11/2017 which was revealed anterolisthesis L5 on S1 with decreased disc space at L5-S1. No other significant abnormalities were noted.  He no longer has pain that goes down to his left foot. His pain does travel into his left buttock area. No numbness or tingling. No bowel or bladder dysfunction. Pain Inventory Average Pain 8 Pain Right Now 8 My pain is sharp, stabbing and aching  In the last 24 hours, has pain interfered with the following? General activity 1 Relation with others 7 Enjoyment of life 1 What TIME of day is your pain at its worst? daytime Sleep (in general) Poor  Pain is worse with: walking, bending, sitting, inactivity and standing Pain improves with: medication and injections Relief from Meds: 0  Mobility walk with assistance use a cane how many minutes can you walk? 5 ability to climb steps?  no do you drive?  yes transfers alone Do you have any goals in this area?  yes  Function disabled: date disabled .  Neuro/Psych weakness numbness tremor tingling trouble walking spasms depression anxiety  Prior Studies Any changes since last visit?  no  Physicians involved in your care Any changes since last visit?  no   Family History  Problem Relation Age of Onset  . Diabetes Mother     . COPD Mother   . Diabetes Sister   . Colon cancer Neg Hx    Social History   Social History  . Marital status: Single    Spouse name: N/A  . Number of children: N/A  . Years of education: N/A   Social History Main Topics  . Smoking status: Never Smoker  . Smokeless tobacco: Never Used  . Alcohol use No     Comment: history of alcohol abuse but quit in 2008   . Drug use: No  . Sexual activity: Yes   Other Topics Concern  . None   Social History Narrative   ** Merged History Encounter **       Past Surgical History:  Procedure Laterality Date  . BACK SURGERY    . CHOLECYSTECTOMY N/A 10/30/2016   Procedure: LAPAROSCOPIC CHOLECYSTECTOMY;  Surgeon: [name], MD;  Location: [facility];  Service: General;  Laterality: N/A;  . COLONOSCOPY N/A 07/25/2016   Dr. [name]: 9 mm tubular adenoma. 5 year surveillance   . ESOPHAGOGASTRODUODENOSCOPY  2009   EGD completed by Dr. [name] due to hematemesis. Findings of normal esophagus, tiny antral erosions, normal D1, D2  . ESOPHAGOGASTRODUODENOSCOPY N/A 05/27/2016   Dr. [name]: normal   . EXTERNAL FIXATION LEG Right 02/09/2015   Procedure: EXTERNAL FIXATION LEG;  Surgeon: [name], MD;  Location: [place];  Service: Orthopedics;  Laterality: Right;  . EXTERNAL FIXATION REMOVAL Right 02/14/2015   Procedure: REMOVAL EXTERNAL FIXATION LEG;  Surgeon: [name], MD;  Location: [place];  Service: Orthopedics;  Laterality: Right;  . FEMUR IM NAIL Right 02/09/2015   Procedure: INTRAMEDULLARY (IM) RETROGRADE FEMORAL NAILING;  Surgeon: [name], MD;  Location: [place];  Service: Orthopedics;  Laterality: Right;  . NOSE SURGERY    . ORIF ANKLE FRACTURE Right 02/14/2015   Procedure: OPEN REDUCTION INTERNAL FIXATION (ORIF) PILON FRACTURE;  Surgeon: [name], MD;  Location: [place];  Service: Orthopedics;  Laterality: Right;  . ORIF ULNAR FRACTURE Right 02/09/2015   Procedure: OPEN REDUCTION INTERNAL FIXATION (ORIF) ULNAR FRACTURE;   Surgeon: [name], MD;  Location: [place];  Service: Orthopedics;  Laterality: Right;  . POLYPECTOMY  07/25/2016   Procedure: POLYPECTOMY;  Surgeon: [name], MD;  [facility];  Service: Endoscopy;;  colon   Past Medical History:  Diagnosis Date  . Accidentally struck by falling tree 02/08/2015  . Acute blood loss anemia 02/09/2015  . Acute respiratory failure ([place]) 02/09/2015  . Arthritis   . Chronic pain due to trauma   . Diabetes mellitus   . Diabetes mellitus ([place])   . Heart murmur   . History of blood clots    DVT  . Hypertension   . Pneumothorax   . Postlaminectomy syndrome, lumbar region 11/13/2015  . Rib fracture   . Smoker 0   denies smoking  . Thrombocytopenia ([place]) 02/09/2015  . Traumatic closed displaced fracture of multiple ribs of right side 02/08/2015  . Traumatic hemopneumothorax 02/09/2015  . Upper GI bleeding 05/27/2016   Trivial. EGD unremarkable.   BP (!) 148/87 (BP Location: Right Arm, Patient Position: Sitting, Cuff Size: Normal)   Pulse 62   Resp 14   SpO2 97%   Opioid Risk Score:   Fall Risk Score:  `1  Depression screen PHQ 2/9  Depression screen [facility] 2/9 10/14/2016 12/11/2015 11/13/2015  Decreased Interest 3 3 3   Down, Depressed, Hopeless 3 3 3   PHQ - 2 Score 6 6 6   Altered sleeping - - 3  Tired, decreased energy - - 3  Change in appetite - - 3  Feeling bad or failure about yourself  - - 3  Trouble concentrating - - 3  Moving slowly or fidgety/restless - - 3  Suicidal thoughts - - 0  PHQ-9 Score - - 24    Review of Systems  HENT: Negative.   Eyes: Negative.   Respiratory: Negative.   Cardiovascular: Negative.   Endocrine: Negative.   Genitourinary: Negative.   Musculoskeletal: Positive for arthralgias, back pain, gait problem, myalgias and neck pain.       Spasms   Skin: Negative.   Allergic/Immunologic: Negative.   Neurological: Positive for tremors, weakness and numbness.       Tingling  Hematological: Negative.     Psychiatric/Behavioral: Positive for dysphoric mood. The patient is nervous/anxious.   All other systems reviewed and are negative.      Objective:   Physical Exam  Constitutional: He is oriented to person, place, and time. He appears well-developed and well-nourished.  HENT:  Head: Normocephalic and atraumatic.  Eyes: Pupils are equal, round, and reactive to light. Conjunctivae and EOM are normal.  Neck: Normal range of motion.  Musculoskeletal:       Thoracic back: He exhibits decreased range of motion and deformity. He exhibits no tenderness.       Lumbar back: He exhibits decreased range of motion, tenderness and deformity. He exhibits no spasm.  Kyphotic deformity throughout the lumbar spine  Reduced lumbar and  thoracic range of motion with flexion, extension, lateral bending and rotation. Extension is the most painful position, however. He has tenderness at the lumbosacral junction. No pain in the thoracic spine  Negative straight leg raising test  Pain with Faber's testing left hip, not right hip pain refers to the low back area, not into the groin area.  Neurological: He is alert and oriented to person, place, and time. He has normal strength. Gait abnormal. Coordination normal.  Sensory testing normal in the L3, L4, L5 dermatomal distribution  Psychiatric: He has a normal mood and affect.    Left shoulder elevated compared to the right side Limited right shoulder range of motion, abduction.     Assessment & Plan:  1. Chronic low back pain, mainly left-sided nonradicular. His symptoms and physical exam findings most consistent with sacroiliac plus minus. Lumbar spondylosis. Will schedule for left sacroiliac injection.  If no improvement, consider lumbar medial branch blocks and failing this, may need lumbar  MR Referral to physical therapy for lumbar stabilization program. They could also address postural issues including kyphotic deformity, which is reversible. Also,  left shoulder elevation compared to right side producing a functional scoliosis

## 2017-03-21 ENCOUNTER — Ambulatory Visit (HOSPITAL_COMMUNITY)
Payer: No Typology Code available for payment source | Attending: Physical Medicine & Rehabilitation | Admitting: Physical Therapy

## 2017-03-21 DIAGNOSIS — M5416 Radiculopathy, lumbar region: Secondary | ICD-10-CM | POA: Insufficient documentation

## 2017-03-21 DIAGNOSIS — R262 Difficulty in walking, not elsewhere classified: Secondary | ICD-10-CM | POA: Insufficient documentation

## 2017-03-25 ENCOUNTER — Encounter: Payer: Self-pay | Admitting: Gastroenterology

## 2017-03-25 ENCOUNTER — Ambulatory Visit (INDEPENDENT_AMBULATORY_CARE_PROVIDER_SITE_OTHER): Payer: Self-pay | Admitting: Gastroenterology

## 2017-03-25 VITALS — BP 140/85 | HR 66 | Temp 97.1°F | Ht 70.0 in | Wt 235.4 lb

## 2017-03-25 DIAGNOSIS — R1013 Epigastric pain: Secondary | ICD-10-CM

## 2017-03-25 MED ORDER — PANTOPRAZOLE SODIUM 40 MG PO TBEC: 40.0000 mg | DELAYED_RELEASE_TABLET | Freq: Every day | ORAL | 3 refills | Status: DC

## 2017-03-25 NOTE — Progress Notes (Signed)
Referring Provider: Raiford Simmonds., PA-C Primary Care Physician:  Raiford Simmonds., PA-C Primary GI: Dr. [name]   Chief Complaint  Patient presents with  . Abdominal Pain    ruq  . Bloated  . Gas    HPI:   Jeremy Burgess is a 51 y.o. male presenting today with a history of chronic RUQ pain since June 2017 after a tree fell on him. CT in October with multiple remotely healed right-sided rib fractures. LFTs normal. EGD on file that is normal. Colonoscopy with adenoma recently and due for surveillance in 5 years. Underwent cholecystectomy due to chronic cholecystitis. Here for follow-up.   Notes gas and bloating at times. RUQ pain improved. Usually related to eating. States about everything he eats causes it. No nausea. Appetite stays the same. Has lost almost 10 lbs since last seen in Feb 2018. States he is eating about the same. Denies any typical reflux symptoms. On Mobic daily.   Since the accident where the tree fell on him, has had a cough and congestion in his chest. No fever or chills. Just chronic cough that is aggravating. No constipation. Only has diarrhea if eats something that upsets him.   Unsure if he was ever taking a probiotic. Goes Monday for injection in back, also checking shoulder.    Past Medical History:  Diagnosis Date  . Accidentally struck by falling tree 02/08/2015  . Acute blood loss anemia 02/09/2015  . Acute respiratory failure ([place]) 02/09/2015  . Arthritis   . Chronic pain due to trauma   . Diabetes mellitus   . Diabetes mellitus ([place])   . Heart murmur   . History of blood clots    DVT  . Hypertension   . Pneumothorax   . Postlaminectomy syndrome, lumbar region 11/13/2015  . Rib fracture   . Smoker 0   denies smoking  . Thrombocytopenia ([place]) 02/09/2015  . Traumatic closed displaced fracture of multiple ribs of right side 02/08/2015  . Traumatic hemopneumothorax 02/09/2015  . Upper GI bleeding 05/27/2016   Trivial. EGD unremarkable.     Past Surgical History:  Procedure Laterality Date  . BACK SURGERY    . CHOLECYSTECTOMY N/A 10/30/2016   Procedure: LAPAROSCOPIC CHOLECYSTECTOMY;  Surgeon: [name], MD;  Location: [facility];  Service: General;  Laterality: N/A;  . COLONOSCOPY N/A 07/25/2016   Dr. [name]: 9 mm tubular adenoma. 5 year surveillance   . ESOPHAGOGASTRODUODENOSCOPY  2009   EGD completed by Dr. [name] due to hematemesis. Findings of normal esophagus, tiny antral erosions, normal D1, D2  . ESOPHAGOGASTRODUODENOSCOPY N/A 05/27/2016   Dr. [name]: normal   . EXTERNAL FIXATION LEG Right 02/09/2015   Procedure: EXTERNAL FIXATION LEG;  Surgeon: [name], MD;  Location: [place];  Service: Orthopedics;  Laterality: Right;  . EXTERNAL FIXATION REMOVAL Right 02/14/2015   Procedure: REMOVAL EXTERNAL FIXATION LEG;  Surgeon: [name], MD;  Location: [place];  Service: Orthopedics;  Laterality: Right;  . FEMUR IM NAIL Right 02/09/2015   Procedure: INTRAMEDULLARY (IM) RETROGRADE FEMORAL NAILING;  Surgeon: [name], MD;  Location: [place];  Service: Orthopedics;  Laterality: Right;  . NOSE SURGERY    . ORIF ANKLE FRACTURE Right 02/14/2015   Procedure: OPEN REDUCTION INTERNAL FIXATION (ORIF) PILON FRACTURE;  Surgeon: [name], MD;  Location: [place];  Service: Orthopedics;  Laterality: Right;  . ORIF ULNAR FRACTURE Right 02/09/2015   Procedure: OPEN REDUCTION INTERNAL FIXATION (ORIF) ULNAR FRACTURE;  Surgeon:  [name], MD;  Location: [place];  Service: Orthopedics;  Laterality: Right;  . POLYPECTOMY  07/25/2016   Procedure: POLYPECTOMY;  Surgeon: [name], MD;  [facility];  Service: Endoscopy;;  colon    Current Outpatient Prescriptions  Medication Sig Dispense Refill  . HYDROcodone-acetaminophen (NORCO) 5-325 MG tablet Take 1-2 tablets by mouth every 6 (six) hours as needed for moderate pain. 50 tablet 0  . insulin detemir (LEVEMIR) 100 UNIT/ML injection Inject 0.6 mLs (60 Units  total) into the skin at bedtime.    [name] lisinopril (PRINIVIL,ZESTRIL) 20 MG tablet Take 20 mg by mouth daily.     . meloxicam (MOBIC) 15 MG tablet Take 1 tablet (15 mg total) by mouth daily. 30 tablet 0  . sitaGLIPtin-metformin (JANUMET) 50-1000 MG tablet Take 1 tablet by mouth 2 (two) times daily with a meal.     No current facility-administered medications for this visit.     Allergies as of 03/25/2017 - Review Complete 03/25/2017  Allergen Reaction Noted  . Bee venom Anaphylaxis 02/08/2015    Family History  Problem Relation Age of Onset  . Diabetes Mother   . COPD Mother   . Diabetes Sister   . Colon cancer Neg Hx     Social History   Social History  . Marital status: Single    Spouse name: N/A  . Number of children: N/A  . Years of education: N/A   Social History Main Topics  . Smoking status: Never Smoker  . Smokeless tobacco: Never Used  . Alcohol use No     Comment: history of alcohol abuse but quit in 2008   . Drug use: No  . Sexual activity: Yes   Other Topics Concern  . None   Social History Narrative   ** Merged History Encounter **        Review of Systems: As mentioned in HPI   Physical Exam: BP 140/85   Pulse 66   Temp (!) 97.1 F (36.2 C) (Oral)   Ht 5\' 10"  (1.778 m)   Wt 235 lb 6.4 oz (106.8 kg)   BMI 33.78 kg/m  General:   Alert and oriented. No distress noted. Pleasant and cooperative.  Head:  Normocephalic and atraumatic. Eyes:  Conjuctiva clear without scleral icterus. Mouth:  Oral mucosa pink and moist.  Abdomen:  +BS, soft, non-tender and non-distended. No rebound or guarding. No HSM or masses noted. Msk:  Symmetrical without gross deformities. Normal posture. Extremities:  Without edema. Neurologic:  Alert and  oriented x4 Psych:  Alert and cooperative. Normal mood and affect.

## 2017-03-25 NOTE — Patient Instructions (Signed)
Start taking Protonix once each morning, 30 minutes before breakfast.   I have ordered the gastric emptying study.  In the meantime, please follow the low FODMAP diet to see if this helps with your symptoms.  We will see you in 3 months.

## 2017-03-27 ENCOUNTER — Encounter (HOSPITAL_COMMUNITY): Payer: Self-pay

## 2017-03-27 ENCOUNTER — Ambulatory Visit (HOSPITAL_COMMUNITY)
Admission: RE | Admit: 2017-03-27 | Discharge: 2017-03-27 | Disposition: A | Discharge status: Home / Self Care | Payer: Self-pay | Source: Ambulatory Visit | Attending: Gastroenterology | Admitting: Gastroenterology

## 2017-03-27 DIAGNOSIS — R1013 Epigastric pain: Secondary | ICD-10-CM

## 2017-03-27 MED ORDER — TECHNETIUM TC 99M SULFUR COLLOID
2.0000 | Freq: Once | INTRAVENOUS | Status: AC | PRN
  Administered 2017-03-27: 2 via ORAL

## 2017-03-27 NOTE — Assessment & Plan Note (Signed)
51 year old male with gas and bloating related to eating. Chronic RUQ pain improved. EGD on file normal. Colonoscopy up-to-date. Will pursue GES to wrap up evaluation. Start Protonix once daily. Follow low FODMAP diet. Return in 3 months.

## 2017-03-31 ENCOUNTER — Ambulatory Visit (HOSPITAL_BASED_OUTPATIENT_CLINIC_OR_DEPARTMENT_OTHER): Payer: No Typology Code available for payment source | Admitting: Physical Medicine & Rehabilitation

## 2017-03-31 ENCOUNTER — Encounter: Payer: Self-pay | Admitting: Physical Medicine & Rehabilitation

## 2017-03-31 VITALS — BP 161/80 | HR 57

## 2017-03-31 DIAGNOSIS — G8929 Other chronic pain: Secondary | ICD-10-CM

## 2017-03-31 DIAGNOSIS — M533 Sacrococcygeal disorders, not elsewhere classified: Secondary | ICD-10-CM

## 2017-03-31 NOTE — Progress Notes (Signed)
  [facility]   Name: FROILAN MCLEAN DOB:06-13-1966 MRN: [id number]  Date:03/31/2017  Physician: Alysia Penna, MD    Nurse/CMA: [place] CMA  Allergies:  Allergies  Allergen Reactions  . Bee Venom Anaphylaxis    Intolerance HONEY BEES    Consent Signed: Yes.    Is patient diabetic? Yes.    CBG today? 45  Pregnant: No. LMP: No LMP for male patient. (age [age])  Anticoagulants: no Anti-inflammatory: no Antibiotics: no  Procedure: Left Sacroiliac Steroid Injection Position: Prone   Start Time: 2:19pm       End Time: 2:22pm        Fluoro Time: 10  RN/CMA Sports administrator CMA    Time 1:52 2:26pm    BP 161/80 165/55    Pulse 57 61    Respirations 14 14    O2 Sat 98% 98    S/S 6 6    Pain Level 9/10 8/10     D/C home with his brother, patient A & O X 3, D/C instructions reviewed, and sits independently.

## 2017-03-31 NOTE — Progress Notes (Signed)
cc'ed to pcp °

## 2017-03-31 NOTE — Progress Notes (Signed)

## 2017-03-31 NOTE — Patient Instructions (Signed)
Sacroiliac injection was performed today. A combination of a naming medicine plus a cortisone medicine was injected. The injection was done under x-ray guidance. This procedure has been performed to help reduce low back and buttocks pain as well as potentially hip pain. The duration of this injection is variable lasting from hours to  Months. It may repeated if needed. 

## 2017-03-31 NOTE — Progress Notes (Signed)
GES is normal. Follow low FODMAP diet. Hopefully he will notice improvement with this.

## 2017-04-02 ENCOUNTER — Ambulatory Visit (HOSPITAL_COMMUNITY): Payer: No Typology Code available for payment source | Admitting: Physical Therapy

## 2017-04-02 DIAGNOSIS — M5416 Radiculopathy, lumbar region: Secondary | ICD-10-CM

## 2017-04-02 DIAGNOSIS — R262 Difficulty in walking, not elsewhere classified: Secondary | ICD-10-CM

## 2017-04-02 NOTE — Patient Instructions (Addendum)
Scapular Retraction (Standing)   May do sitting  With arms at sides, pinch shoulder blades together. Repeat __10__ times per set. Do ___1_ sets per session. Do __2__ sessions per day.  http://orth.exer.us/944   Copyright  VHI. All rights reserved.  Toe Raise (Sitting)    Raise toes, keeping heels on floor. Then raise your heels up  Repeat 10____ times per set. Do __1__ sets per session. Do __2__ sessions per day.  http://orth.exer.us/46   Copyright  VHI. All rights reserved.  Knee Extension (Sitting)    Place ___0_ pound weight on left ankle and straighten knee fully, lower slowly. Repeat _10___ times per set. Do ___1_ sets per session. Do _3___ sessions per day.  http://orth.exer.us/732   Copyright  VHI. All rights reserved.  Isometric Abdominal    Lying on back with knees bent, tighten stomach by pulling right and left side towards midline. Hold _5___ seconds. Repeat _10___ times per set. Do _1___ sets per session. Do ___3_ sessions per day.  http://orth.exer.us/1086   Copyright  VHI. All rights reserved.  Isometric Gluteals   May do with knees bent up.   Tighten buttock muscles. Repeat __10__ times per set. Do __1__ sets per session. Do __3__ sessions per day.  http://orth.exer.us/1126   Copyright  VHI. All rights reserved.

## 2017-04-02 NOTE — Therapy (Addendum)
[place], [place], [address] Phone: [phone]   Fax:  [phone]  Physical Therapy Evaluation  Patient Details  Name: LARONE KLIETHERMES MRN: [id number] Date of Birth: 1966-04-08 Referring Provider: Jacobo Forest   Encounter Date: 04/02/2017      PT End of Session - 04/02/17 1026    Visit Number 1   Number of Visits 8   Date for PT Re-Evaluation 05/02/17   Authorization Type 100% discount   Authorization - Visit Number 1   Authorization - Number of Visits 8   PT Start Time [phone]   PT Stop Time 1038   PT Time Calculation (min) 50 min   Activity Tolerance Patient tolerated treatment well   Behavior During Therapy [facility] for tasks assessed/performed      Past Medical History:  Diagnosis Date  . Accidentally struck by falling tree 02/08/2015  . Acute blood loss anemia 02/09/2015  . Acute respiratory failure ([place]) 02/09/2015  . Arthritis   . Chronic pain due to trauma   . Diabetes mellitus   . Diabetes mellitus ([place])   . Heart murmur   . History of blood clots    DVT  . Hypertension   . Pneumothorax   . Postlaminectomy syndrome, lumbar region 11/13/2015  . Rib fracture   . Smoker 0   denies smoking  . Thrombocytopenia ([place]) 02/09/2015  . Traumatic closed displaced fracture of multiple ribs of right side 02/08/2015  . Traumatic hemopneumothorax 02/09/2015  . Upper GI bleeding 05/27/2016   Trivial. EGD unremarkable.    Past Surgical History:  Procedure Laterality Date  . BACK SURGERY    . CHOLECYSTECTOMY N/A 10/30/2016   Procedure: LAPAROSCOPIC CHOLECYSTECTOMY;  Surgeon: [name], MD;  Location: [facility];  Service: General;  Laterality: N/A;  . COLONOSCOPY N/A 07/25/2016   Dr. [name]: 9 mm tubular adenoma. 5 year surveillance   . ESOPHAGOGASTRODUODENOSCOPY  2009   EGD completed by Dr. [name] due to hematemesis. Findings of normal esophagus, tiny antral erosions, normal D1, D2  .  ESOPHAGOGASTRODUODENOSCOPY N/A 05/27/2016   Dr. [name]: normal   . EXTERNAL FIXATION LEG Right 02/09/2015   Procedure: EXTERNAL FIXATION LEG;  Surgeon: [name], MD;  Location: [place];  Service: Orthopedics;  Laterality: Right;  . EXTERNAL FIXATION REMOVAL Right 02/14/2015   Procedure: REMOVAL EXTERNAL FIXATION LEG;  Surgeon: [name], MD;  Location: [place];  Service: Orthopedics;  Laterality: Right;  . FEMUR IM NAIL Right 02/09/2015   Procedure: INTRAMEDULLARY (IM) RETROGRADE FEMORAL NAILING;  Surgeon: [name], MD;  Location: [place];  Service: Orthopedics;  Laterality: Right;  . NOSE SURGERY    . ORIF ANKLE FRACTURE Right 02/14/2015   Procedure: OPEN REDUCTION INTERNAL FIXATION (ORIF) PILON FRACTURE;  Surgeon: [name], MD;  Location: [place];  Service: Orthopedics;  Laterality: Right;  . ORIF ULNAR FRACTURE Right 02/09/2015   Procedure: OPEN REDUCTION INTERNAL FIXATION (ORIF) ULNAR FRACTURE;  Surgeon: [name], MD;  Location: [place];  Service: Orthopedics;  Laterality: Right;  . POLYPECTOMY  07/25/2016   Procedure: POLYPECTOMY;  Surgeon: [name], MD;  [facility];  Service: Endoscopy;;  colon    There were no vitals filed for this visit.       Subjective Assessment - 04/02/17 0905    Subjective Mr. Turrell states that he had a tree fall on him on February 07 2015 with multiple fx, collapse lung, fx pelvic,  fx, wrist  foot , ribs and knee.  He stayed on intensive care for 37 days.  He has extenstive pain ever since.  At this time he is experiencing Lt low back pain that goes into his buttock and runs down his leg to his foot. His left toes are going numb.  He had an injection yesterday which has not changed his pain.  He is being referred to skilled physical therapy due to the progession of his pain.       Pertinent History back surgery 1987   Limitations Sitting;Lifting;Standing;Walking;House hold activities   How long can you sit comfortably?  immediate pain    How long can you stand comfortably? immediate pain needs to sit down less than five minutes.    How long can you walk comfortably? Walks with a cane household ambulator only    Patient Stated Goals Pt has only had therapy in the hospital he hopes to get stronger and less pain    Currently in Pain? Yes   Pain Score 9   goes to a 10/10   Pain Location Back   Pain Orientation Left;Lower   Pain Descriptors / Indicators Aching;Constant   Pain Type Chronic pain   Pain Radiating Towards Lt toe    Pain Onset More than a month ago   Pain Frequency Constant   Aggravating Factors  activity    Pain Relieving Factors not sure he has tried HP, IP no relief   Effect of Pain on Daily Activities increases             [facility] PT Assessment - 04/02/17 0001      Assessment   Medical Diagnosis Lt lumbar radicular pain    Referring Provider Jacobo Forest    Onset Date/Surgical Date 02/07/15   Next MD Visit unknown   Prior Therapy acute only      Precautions   Precautions None     Restrictions   Weight Bearing Restrictions No     Balance Screen   Has the patient fallen in the past 6 months No   Has the patient had a decrease in activity level because of a fear of falling?  Yes   Is the patient reluctant to leave their home because of a fear of falling?  No     Prior Function   Level of Independence Independent with household mobility with device   Vocation Unemployed   Leisure none     Cognition   Overall Cognitive Status Within Functional Limits for tasks assessed     Observation/Other Assessments   Focus on Therapeutic Outcomes (FOTO)  41     Functional Tests   Functional tests Sit to Stand     Sit to Stand   Comments 5x with UE 55.87      Posture/Postural Control   Posture/Postural Control Postural limitations   Postural Limitations Rounded Shoulders;Increased thoracic kyphosis   Posture Comments stands 30 degrees forward bent with knees flexed      ROM  / Strength   AROM / PROM / Strength Strength     Strength   Strength Assessment Site Hip;Knee;Ankle   Right/Left Hip Right;Left   Right Hip Flexion 2-/5   Right Hip ABduction 2-/5   Left Hip Flexion 2-/5   Left Hip ABduction 2-/5   Right/Left Knee Right;Left   Right Knee Extension 3-/5   Left Knee Extension 3-/5   Right/Left Ankle Right;Left   Right Ankle Dorsiflexion 2-/5   Left Ankle Dorsiflexion  2-/5            Objective measurements completed on examination: See above findings.          [place] Adult PT Treatment/Exercise - 04/02/17 0001      Exercises   Exercises Knee/Hip     Knee/Hip Exercises: Seated   Long Arc Quad Both;5 reps   Other Seated Knee/Hip Exercises sit as tall as possible hold x 5; scapular retraction x 5                 PT Education - 04/02/17 1025    Education provided Yes   Person(s) Educated Patient   Methods Explanation;Handout   Comprehension Verbalized understanding;Returned demonstration          PT Short Term Goals - 04/02/17 1310      PT SHORT TERM GOAL #1   Title Pt to be able to stand for 10 minutes without experiencing increased back or leg pain to be able to complete his self grooming tasks.   Time 2   Period Weeks   Status New   Target Date 04/16/17     PT SHORT TERM GOAL #2   Title Pt to be able to walk for ten minutes without experiencing increased back or leg pain .   Time 2   Period Weeks   Status New   Target Date 04/16/17     PT SHORT TERM GOAL #3   Title PT hip and back ROM to improve to allow pt to be able to get in and out of the car with minimal difficulty   Time 2   Period Weeks   Status New     PT SHORT TERM GOAL #4   Title Pt pain level in his back and leg  to be no greater than an 8/10 to allow pt to tolerate increased household activity.    Time 2   Period Weeks           PT Long Term Goals - 04/02/17 1314      PT LONG TERM GOAL #1   Title Pt to be able to stand for 15  mintues without increased leg or back pain to be able to wait in the grocery line.    Time 4   Period Weeks   Status New   Target Date 04/30/17     PT LONG TERM GOAL #2   Title Pt to be ble to walk for 20 minutes without having increased back or leg pain to allow pt to complete short shopping trips in relative comfort.    Time 4   Period Weeks   Status New   Target Date 04/30/17     PT LONG TERM GOAL #3   Title Pt pain to be no greater than a 6/10 in his back and low back to allow pt to be able to complete light household cleaning.   Time 4   Period Weeks     PT LONG TERM GOAL #4   Title Pt to be able to verbalize the importance of proper posture and body mechanics to have improved back health.  Pt to stand with less than ten degree forward flexion to remove stress off of his low back.    Time 4   Period Weeks                Plan - 04/02/17 1028    Clinical Impression Statement Mr. Chittick is a 51 yo male who had a  tree fall on him on 02/07/2015 sustaining multiple injuries and fractures.  He has not had any out patient physical therapy.  He has had progressive radicular low back pain ever since and is currently being referred to skilled physical therapy to attempt to improve his functional ability.    History and Personal Factors relevant to plan of care: back surgery in 1987 at multiple levels, multiple trauma in above accident.   Clinical Presentation Evolving   Clinical Decision Making Moderate   Rehab Potential Good   PT Frequency 2x / week   PT Duration 4 weeks   PT Treatment/Interventions ADLs/Self Care Home Management;Therapeutic activities;Therapeutic exercise;Patient/family education;Manual techniques   PT Next Visit Plan Begin stretching to include but not limited to knee to chest, hamstring, piriformis, and trunk rotation.  Begin bridging and bent knee lifts.    PT Home Exercise Plan 04/03/2007:  Siting scapular retraction, toe/heel raises, LAQ and glut sets        Patient will benefit from skilled therapeutic intervention in order to improve the following deficits and impairments:  Decreased activity tolerance, Decreased range of motion, Decreased strength, Difficulty walking, Postural dysfunction, Pain  Visit Diagnosis: Radiculopathy, lumbar region  Difficulty in walking, not elsewhere classified     Problem List Patient Active Problem List   Diagnosis Date Noted  . Dyspepsia 03/25/2017  . Calculus of gallbladder without cholecystitis without obstruction   . Encounter for screening colonoscopy 07/01/2016  . RUQ pain 07/01/2016  . AKI (acute kidney injury) ([place]) 05/27/2016  . Upper GI bleeding 05/27/2016  . Upper GI bleed 05/26/2016  . Chronic pain due to trauma 12/11/2015  . Postlaminectomy syndrome, lumbar region 11/13/2015  . Anterior knee pain   . Pain in limb   . Closed right pilon fracture 02/23/2015  . Ankle fracture, right 02/09/2015  . Acute blood loss anemia 02/09/2015  . Thrombocytopenia ([place]) 02/09/2015  . Acute renal failure ([place]) 02/09/2015  . Acute respiratory failure ([place]) 02/09/2015  . Traumatic hemopneumothorax 02/09/2015  . HTN (hypertension) 02/09/2015  . Accidentally struck by falling tree 02/08/2015  . Insulin dependent diabetes mellitus ([place]) 02/08/2015  . Traumatic closed displaced fracture of multiple ribs of right side 02/08/2015  . Closed fracture of right ulna 02/08/2015  . Femur fracture, right [facility]) 02/08/2015    Rayetta Humphrey, PT CLT [phone] 04/02/2017, 1:24 PM  [place] [address] [place], [place], [address] Phone: [phone]   Fax:  [phone]  Name: SAQIB CAZAREZ MRN: [id number] Date of Birth: 19-Oct-1965   04/16/2017  PT called stated that he was sore after the evaluation and would not be back. Discharge 1 visit.  No change in goals.  Rayetta Humphrey, [place] CLT [phone]

## 2017-04-08 ENCOUNTER — Ambulatory Visit (HOSPITAL_COMMUNITY): Payer: No Typology Code available for payment source | Admitting: Physical Therapy

## 2017-04-08 NOTE — Telephone Encounter (Signed)
Attempted to call pt. Re missed appointment.  There was no answer and his phone box had not been set up.  Rayetta Humphrey, [place] CLT [phone]

## 2017-04-10 ENCOUNTER — Ambulatory Visit (HOSPITAL_COMMUNITY): Payer: No Typology Code available for payment source | Admitting: Physical Therapy

## 2017-04-10 NOTE — Telephone Encounter (Signed)
Attempted to call pt again re: his second no show.  There was no answer and his phone box was not set up to take messages.  Rayetta Humphrey, [place] CLT [phone]

## 2017-04-16 ENCOUNTER — Telehealth (HOSPITAL_COMMUNITY): Payer: Self-pay

## 2017-04-16 ENCOUNTER — Ambulatory Visit (HOSPITAL_COMMUNITY): Payer: No Typology Code available for payment source | Attending: Physical Medicine & Rehabilitation

## 2017-04-16 NOTE — Telephone Encounter (Signed)
No show.  Called and spoke with pt. who stated he had no decrease in pain following shots prior evaluation and increased pain following the stretches initial evaluation.  Pt does not wish to return to therapy until he sees MD 9/17.   [address] [place]; CBIS [phone]

## 2017-04-18 ENCOUNTER — Encounter (HOSPITAL_COMMUNITY): Payer: No Typology Code available for payment source | Admitting: Physical Therapy

## 2017-04-22 ENCOUNTER — Encounter (HOSPITAL_COMMUNITY): Payer: No Typology Code available for payment source | Admitting: Physical Therapy

## 2017-04-24 ENCOUNTER — Encounter (HOSPITAL_COMMUNITY): Payer: No Typology Code available for payment source | Admitting: Physical Therapy

## 2017-04-28 ENCOUNTER — Encounter: Payer: Self-pay | Admitting: Physical Medicine & Rehabilitation

## 2017-04-28 ENCOUNTER — Encounter: Payer: No Typology Code available for payment source | Attending: Physical Medicine & Rehabilitation

## 2017-04-28 ENCOUNTER — Ambulatory Visit (HOSPITAL_BASED_OUTPATIENT_CLINIC_OR_DEPARTMENT_OTHER): Payer: No Typology Code available for payment source | Admitting: Physical Medicine & Rehabilitation

## 2017-04-28 VITALS — BP 181/97 | HR 66

## 2017-04-28 DIAGNOSIS — Z9889 Other specified postprocedural states: Secondary | ICD-10-CM | POA: Insufficient documentation

## 2017-04-28 DIAGNOSIS — G8929 Other chronic pain: Secondary | ICD-10-CM | POA: Insufficient documentation

## 2017-04-28 DIAGNOSIS — M5030 Other cervical disc degeneration, unspecified cervical region: Secondary | ICD-10-CM | POA: Insufficient documentation

## 2017-04-28 DIAGNOSIS — R0781 Pleurodynia: Secondary | ICD-10-CM | POA: Insufficient documentation

## 2017-04-28 DIAGNOSIS — M4316 Spondylolisthesis, lumbar region: Secondary | ICD-10-CM | POA: Insufficient documentation

## 2017-04-28 DIAGNOSIS — M47816 Spondylosis without myelopathy or radiculopathy, lumbar region: Secondary | ICD-10-CM

## 2017-04-28 MED ORDER — GABAPENTIN 100 MG PO CAPS: 100.0000 mg | ORAL_CAPSULE | Freq: Two times a day (BID) | ORAL | 1 refills | Status: DC

## 2017-04-28 NOTE — Patient Instructions (Signed)

## 2017-04-28 NOTE — Progress Notes (Signed)
Left Lumbar L3, L4  medial branch blocks and L 5 dorsal ramus injection under fluoroscopic guidance   Indication: Left Lumbar pain which is not relieved by medication management or other conservative care and interfering with self-care and mobility.  Informed consent was obtained after describing risks and benefits of the procedure with the patient, this includes bleeding, bruising, infection, paralysis and medication side effects.  The patient wishes to proceed and has given written consent.  The patient was placed in a prone position.  The lumbar area was marked and prepped with Betadine.  One mL of 1% lidocaine was injected into each of 3 areas into the skin and subcutaneous tissue.  Then a 22-gauge 5in spinal needle was inserted targeting the junction of the left S1 superior articular process and sacral ala junction.  Needle was advanced under fluoroscopic guidance.  Bone contact was made. Isovue 200 was injected x 0.5 mL demonstrating no intravascular uptake.  Then a solution containing  2% MPF lidocaine was injected x 0.5 mL.  Then the left L5 superior articular process in transverse process junction was targeted.  Bone contact was made. Isovue 200 was injected x 0.5 mL demonstrating no intravascular uptake.  Then a solution containing 2% MPF lidocaine was injected x 0.5 mL.  Then the left L4 superior articular process in transverse process junction was targeted.  Bone contact was made.  Isovue 200 was injected x 0.5 mL demonstrating no intravascular uptake.  Then a solution containing  2% MPF lidocaine was injected x 0.5 mL.  Patient tolerated procedure well.  Post procedure instructions were given.  Complains of shooting nerve pain in the right shoulder will call in some gabapentin 100 mg twice a day

## 2017-04-28 NOTE — Progress Notes (Signed)
  [facility]   Name: ELON EOFF DOB:24-Jun-1966 MRN: [id number]  Date:04/28/2017  Physician: Alysia Penna, MD    Nurse/CMA: Bright CMA  Allergies:  Allergies  Allergen Reactions  . Bee Venom Anaphylaxis    Intolerance HONEY BEES    Consent Signed: Yes.    Is patient diabetic? Yes.    CBG today? 65  Pregnant: No. LMP: No LMP for male patient. (age 53-55)  Anticoagulants: no Anti-inflammatory: no Antibiotics: no  Procedure: L4-5 MBB Position: Prone   Start Time:  135pm   End Time: 141pm  Fluoro Time: 26s  RN/CMA Bright CMA Bright CMA    Time 103pm 14pm7    BP 181/97 161/82    Pulse 66 62    Respirations 16 16    O2 Sat 97 97    S/S 6 6    Pain Level 9/10 2/10     D/C home with Eda Paschal, patient A & O X 3, D/C instructions reviewed, and sits independently.

## 2017-04-29 ENCOUNTER — Encounter (HOSPITAL_COMMUNITY): Payer: No Typology Code available for payment source | Admitting: Physical Therapy

## 2017-05-01 ENCOUNTER — Encounter (HOSPITAL_COMMUNITY): Payer: No Typology Code available for payment source | Admitting: Physical Therapy

## 2017-05-06 ENCOUNTER — Encounter (HOSPITAL_COMMUNITY): Payer: No Typology Code available for payment source | Admitting: Physical Therapy

## 2017-05-08 ENCOUNTER — Encounter (HOSPITAL_COMMUNITY): Payer: No Typology Code available for payment source | Admitting: Physical Therapy

## 2017-05-15 ENCOUNTER — Emergency Department (HOSPITAL_COMMUNITY): Payer: No Typology Code available for payment source

## 2017-05-15 ENCOUNTER — Emergency Department (HOSPITAL_COMMUNITY)
Admission: EM | Admit: 2017-05-15 | Discharge: 2017-05-15 | Disposition: A | Discharge status: Home / Self Care | Payer: No Typology Code available for payment source | Attending: Emergency Medicine | Admitting: Emergency Medicine

## 2017-05-15 ENCOUNTER — Encounter (HOSPITAL_COMMUNITY): Payer: Self-pay | Admitting: Emergency Medicine

## 2017-05-15 DIAGNOSIS — Z86718 Personal history of other venous thrombosis and embolism: Secondary | ICD-10-CM | POA: Insufficient documentation

## 2017-05-15 DIAGNOSIS — R011 Cardiac murmur, unspecified: Secondary | ICD-10-CM | POA: Insufficient documentation

## 2017-05-15 DIAGNOSIS — E1165 Type 2 diabetes mellitus with hyperglycemia: Secondary | ICD-10-CM | POA: Insufficient documentation

## 2017-05-15 DIAGNOSIS — J40 Bronchitis, not specified as acute or chronic: Secondary | ICD-10-CM

## 2017-05-15 DIAGNOSIS — I129 Hypertensive chronic kidney disease with stage 1 through stage 4 chronic kidney disease, or unspecified chronic kidney disease: Secondary | ICD-10-CM | POA: Insufficient documentation

## 2017-05-15 DIAGNOSIS — Z79899 Other long term (current) drug therapy: Secondary | ICD-10-CM | POA: Insufficient documentation

## 2017-05-15 DIAGNOSIS — J209 Acute bronchitis, unspecified: Secondary | ICD-10-CM | POA: Insufficient documentation

## 2017-05-15 DIAGNOSIS — N189 Chronic kidney disease, unspecified: Secondary | ICD-10-CM | POA: Insufficient documentation

## 2017-05-15 LAB — BASIC METABOLIC PANEL
ANION GAP: 8 (ref 5–15)
BUN: 40 mg/dL — ABNORMAL HIGH (ref 6–20)
CHLORIDE: 104 mmol/L (ref 101–111)
CO2: 23 mmol/L (ref 22–32)
CREATININE: 1.79 mg/dL — AB (ref 0.61–1.24)
Calcium: 9 mg/dL (ref 8.9–10.3)
GFR calc non Af Amer: 42 mL/min — ABNORMAL LOW (ref >60)
GFR, EST AFRICAN AMERICAN: 48 mL/min — AB (ref >60)
Glucose, Bld: 159 mg/dL — ABNORMAL HIGH (ref 65–99)
POTASSIUM: 5 mmol/L (ref 3.5–5.1)
SODIUM: 135 mmol/L (ref 135–145)

## 2017-05-15 LAB — CBC WITH DIFFERENTIAL/PLATELET
BASOS ABS: 0 10*3/uL (ref 0.0–0.1)
Basophils Relative: 0 %
Eosinophils Absolute: 0.4 10*3/uL (ref 0.0–0.7)
Eosinophils Relative: 4 %
HEMATOCRIT: 38.5 % — AB (ref 39.0–52.0)
Hemoglobin: 12.6 g/dL — ABNORMAL LOW (ref 13.0–17.0)
LYMPHS PCT: 29 %
Lymphs Abs: 2.8 10*3/uL (ref 0.7–4.0)
MCH: 28.7 pg (ref 26.0–34.0)
MCHC: 32.7 g/dL (ref 30.0–36.0)
MCV: 87.7 fL (ref 78.0–100.0)
MONO ABS: 0.8 10*3/uL (ref 0.1–1.0)
Monocytes Relative: 9 %
NEUTROS ABS: 5.7 10*3/uL (ref 1.7–7.7)
NEUTROS PCT: 58 %
Platelets: 260 10*3/uL (ref 150–400)
RBC: 4.39 MIL/uL (ref 4.22–5.81)
RDW: 13.4 % (ref 11.5–15.5)
WBC: 9.8 10*3/uL (ref 4.0–10.5)

## 2017-05-15 MED ORDER — ALBUTEROL SULFATE HFA 108 (90 BASE) MCG/ACT IN AERS
2.0000 | INHALATION_SPRAY | RESPIRATORY_TRACT | Status: DC | PRN
  Administered 2017-05-15: 2 via RESPIRATORY_TRACT
  Filled 2017-05-15: qty 6.7

## 2017-05-15 MED ORDER — DOXYCYCLINE HYCLATE 100 MG PO CAPS: 100.0000 mg | ORAL_CAPSULE | Freq: Two times a day (BID) | ORAL | 0 refills | Status: DC

## 2017-05-15 MED ORDER — AEROCHAMBER PLUS W/MASK MISC
1.0000 | Freq: Once | Status: AC
  Administered 2017-05-15: 1
  Filled 2017-05-15: qty 1

## 2017-05-15 MED ORDER — DOXYCYCLINE HYCLATE 100 MG PO TABS
100.0000 mg | ORAL_TABLET | Freq: Once | ORAL | Status: AC
  Administered 2017-05-15: 100 mg via ORAL
  Filled 2017-05-15: qty 1

## 2017-05-15 NOTE — ED Notes (Signed)
Pt states a tree feel on him 2 weeks ago, he has had cough ever since

## 2017-05-15 NOTE — ED Notes (Signed)
Called RT for inhaler and spacer

## 2017-05-15 NOTE — ED Triage Notes (Signed)
Pt c/o cough and congestion x several weeks.

## 2017-05-15 NOTE — Discharge Instructions (Signed)
Take the antibiotic as prescribed .Your kidney function today is abnormal. It should be rechecked by your primary care physician within a month. Tell your primary care physician at today BUN was 40. Creatinine 1.79. Your blood pressure is elevated today at 160/75 and should be rechecked by your primary care physician within 2-3 weeks.. Return if concern for any reason

## 2017-05-15 NOTE — ED Provider Notes (Addendum)
[facility] DEPT Provider Note   CSN: [id number] Arrival date & time: 05/15/17  1534     History   Chief Complaint Chief Complaint  Patient presents with  . Shortness of Breath    HPI Jeremy Burgess is a 51 y.o. male.  HPI complains of cough productive of clear sputum for 10 weeks, progressively worsening. Symptoms associated with mild shortness of breath,, subjective fever he did not take his temperature. Denies any chills. He complains of pain across his chest worse with coughing. No treatment prior to coming here no nausea or vomiting. No other associated symptoms.. Symptoms not improved by anything.  Past Medical History:  Diagnosis Date  . Accidentally struck by falling tree 02/08/2015  . Acute blood loss anemia 02/09/2015  . Acute respiratory failure ([place]) 02/09/2015  . Arthritis   . Chronic pain due to trauma   . Diabetes mellitus   . Diabetes mellitus ([place])   . Heart murmur   . History of blood clots    DVT  . Hypertension   . Pneumothorax   . Postlaminectomy syndrome, lumbar region 11/13/2015  . Rib fracture   . Smoker 0   denies smoking  . Thrombocytopenia ([place]) 02/09/2015  . Traumatic closed displaced fracture of multiple ribs of right side 02/08/2015  . Traumatic hemopneumothorax 02/09/2015  . Upper GI bleeding 05/27/2016   Trivial. EGD unremarkable.    Patient Active Problem List   Diagnosis Date Noted  . Dyspepsia 03/25/2017  . Calculus of gallbladder without cholecystitis without obstruction   . Encounter for screening colonoscopy 07/01/2016  . RUQ pain 07/01/2016  . AKI (acute kidney injury) ([place]) 05/27/2016  . Upper GI bleeding 05/27/2016  . Upper GI bleed 05/26/2016  . Chronic pain due to trauma 12/11/2015  . Postlaminectomy syndrome, lumbar region 11/13/2015  . Anterior knee pain   . Pain in limb   . Closed right pilon fracture 02/23/2015  . Ankle fracture, right 02/09/2015  . Acute blood loss anemia 02/09/2015  . Thrombocytopenia ([place])  02/09/2015  . Acute renal failure ([place]) 02/09/2015  . Acute respiratory failure ([place]) 02/09/2015  . Traumatic hemopneumothorax 02/09/2015  . HTN (hypertension) 02/09/2015  . Accidentally struck by falling tree 02/08/2015  . Insulin dependent diabetes mellitus ([place]) 02/08/2015  . Traumatic closed displaced fracture of multiple ribs of right side 02/08/2015  . Closed fracture of right ulna 02/08/2015  . Femur fracture, right ([place]) 02/08/2015    Past Surgical History:  Procedure Laterality Date  . BACK SURGERY    . CHOLECYSTECTOMY N/A 10/30/2016   Procedure: LAPAROSCOPIC CHOLECYSTECTOMY;  Surgeon: [name], MD;  Location: [facility];  Service: General;  Laterality: N/A;  . COLONOSCOPY N/A 07/25/2016   Dr. [name]: 9 mm tubular adenoma. 5 year surveillance   . ESOPHAGOGASTRODUODENOSCOPY  2009   EGD completed by Dr. [name] due to hematemesis. Findings of normal esophagus, tiny antral erosions, normal D1, D2  . ESOPHAGOGASTRODUODENOSCOPY N/A 05/27/2016   Dr. [name]: normal   . EXTERNAL FIXATION LEG Right 02/09/2015   Procedure: EXTERNAL FIXATION LEG;  Surgeon: [name], MD;  Location: [facility];  Service: Orthopedics;  Laterality: Right;  . EXTERNAL FIXATION REMOVAL Right 02/14/2015   Procedure: REMOVAL EXTERNAL FIXATION LEG;  Surgeon: [name], MD;  Location: [facility];  Service: Orthopedics;  Laterality: Right;  . FEMUR IM NAIL Right 02/09/2015   Procedure: INTRAMEDULLARY (IM) RETROGRADE FEMORAL NAILING;  Surgeon: [name], MD;  Location: [facility];  Service: Orthopedics;  Laterality: Right;  .  NOSE SURGERY    . ORIF ANKLE FRACTURE Right 02/14/2015   Procedure: OPEN REDUCTION INTERNAL FIXATION (ORIF) PILON FRACTURE;  Surgeon: [name], MD;  Location: [facility];  Service: Orthopedics;  Laterality: Right;  . ORIF ULNAR FRACTURE Right 02/09/2015   Procedure: OPEN REDUCTION INTERNAL FIXATION (ORIF) ULNAR FRACTURE;  Surgeon: [name], MD;  Location: [facility];  Service: Orthopedics;   Laterality: Right;  . POLYPECTOMY  07/25/2016   Procedure: POLYPECTOMY;  Surgeon: [name], MD;  [facility];  Service: Endoscopy;;  colon       Home Medications    Prior to Admission medications   Medication Sig Start Date End Date Taking? Authorizing Provider  gabapentin (NEURONTIN) 100 MG capsule Take 1 capsule (100 mg total) by mouth 2 (two) times daily. 04/28/17   Kirsteins, Luanna Salk, MD  insulin detemir (LEVEMIR) 100 UNIT/ML injection Inject 0.6 mLs (60 Units total) into the skin at bedtime. 05/28/16   Rexene Alberts, MD  lisinopril (PRINIVIL,ZESTRIL) 20 MG tablet Take 20 mg by mouth daily.     [provider]  meloxicam (MOBIC) 15 MG tablet Take 1 tablet (15 mg total) by mouth daily. 02/03/17   Kirsteins, Luanna Salk, MD  pantoprazole (PROTONIX) 40 MG tablet Take 1 tablet (40 mg total) by mouth daily. Take 30 minutes before breakfast 03/25/17   Annitta Needs, NP  sitaGLIPtin-metformin (JANUMET) 50-1000 MG tablet Take 1 tablet by mouth 2 (two) times daily with a meal.    [provider]    Family History Family History  Problem Relation Age of Onset  . Diabetes Mother   . COPD Mother   . Diabetes Sister   . Colon cancer Neg Hx     Social History Social History  Substance Use Topics  . Smoking status: Never Smoker  . Smokeless tobacco: Never Used  . Alcohol use No     Comment: history of alcohol abuse but quit in 5638    No illicit drug use  Allergies   Bee venom   Review of Systems Review of Systems  Constitutional: Negative.   HENT: Negative.   Respiratory: Positive for cough and shortness of breath.   Cardiovascular: Negative.   Gastrointestinal: Negative.   Musculoskeletal: Positive for gait problem.       Walks with cane  Skin: Negative.   Allergic/Immunologic: Positive for immunocompromised state.       Diabetic  Psychiatric/Behavioral: Negative.   All other systems reviewed and are negative.    Physical  Exam Updated Vital Signs BP (!) 166/75 (BP Location: Right Arm)   Pulse 67   Temp 98.3 F (36.8 C) (Oral)   Resp 18   SpO2 98%   Physical Exam  Constitutional: He is oriented to person, place, and time. No distress.  Chronically ill-appearing  HENT:  Head: Normocephalic and atraumatic.  Eyes: Pupils are equal, round, and reactive to light. Conjunctivae are normal.  Neck: Neck supple. No tracheal deviation present. No thyromegaly present.  Cardiovascular: Normal rate and regular rhythm.   No murmur heard. Pulmonary/Chest: Effort normal. No respiratory distress.  No respiratory distress. Speaks in paragraphs, coughing frequently. Diffuse rhonchi  Abdominal: Soft. Bowel sounds are normal. He exhibits no distension. There is no tenderness.  Musculoskeletal: Normal range of motion. He exhibits no edema or tenderness.  Neurological: He is alert and oriented to person, place, and time. Coordination normal.  Skin: Skin is warm and dry. No rash noted.  Psychiatric: He has  a normal mood and affect.  Nursing note and vitals reviewed.    ED Treatments / Results  Labs (all labs ordered are listed, but only abnormal results are displayed) Labs Reviewed  CBC WITH DIFFERENTIAL/PLATELET - Abnormal; Notable for the following:       Result Value   Hemoglobin 12.6 (*)    HCT 38.5 (*)    All other components within normal limits  BASIC METABOLIC PANEL - Abnormal; Notable for the following:    Glucose, Bld 159 (*)    BUN 40 (*)    Creatinine, Ser 1.79 (*)    GFR calc non Af Amer 42 (*)    GFR calc Af Amer 48 (*)    All other components within normal limits    EKG  EKG Interpretation None       Radiology Dg Chest 2 View  Result Date: 05/15/2017 CLINICAL DATA:  Short of breath, productive cough EXAM: CHEST  2 VIEW COMPARISON:  05/26/2016, CT chest 05/26/2016 FINDINGS: Mild hyperinflation. No acute consolidation or pleural effusion is seen. Focal scarring in the right apex.  Posttraumatic deformity of the right ribs and thoracic cage as before. Pleural scarring at the right base. Normal cardiomediastinal silhouette. No pneumothorax. Old distal right clavicle fracture. Chronic compression of T12 unchanged. Degenerative changes. IMPRESSION: No active cardiopulmonary disease. Old posttraumatic deformity of the right thorax with areas of pleural and parenchymal scarring Electronically Signed   By: Donavan Foil M.D.   On: 05/15/2017 16:30    Procedures Procedures (including critical care time)  Medications Ordered in ED Medications  albuterol (PROVENTIL HFA;VENTOLIN HFA) 108 (90 Base) MCG/ACT inhaler 2 puff (not administered)  aerochamber plus with mask device 1 each (not administered)  doxycycline (VIBRA-TABS) tablet 100 mg (not administered)   Chest x-ray viewed by me Results for orders placed or performed during the hospital encounter of 05/15/17  CBC with Differential  Result Value Ref Range   WBC 9.8 4.0 - 10.5 K/uL   RBC 4.39 4.22 - 5.81 MIL/uL   Hemoglobin 12.6 (L) 13.0 - 17.0 g/dL   HCT 38.5 (L) 39.0 - 52.0 %   MCV 87.7 78.0 - 100.0 fL   MCH 28.7 26.0 - 34.0 pg   MCHC 32.7 30.0 - 36.0 g/dL   RDW 13.4 11.5 - 15.5 %   Platelets 260 150 - 400 K/uL   Neutrophils Relative % 58 %   Neutro Abs 5.7 1.7 - 7.7 K/uL   Lymphocytes Relative 29 %   Lymphs Abs 2.8 0.7 - 4.0 K/uL   Monocytes Relative 9 %   Monocytes Absolute 0.8 0.1 - 1.0 K/uL   Eosinophils Relative 4 %   Eosinophils Absolute 0.4 0.0 - 0.7 K/uL   Basophils Relative 0 %   Basophils Absolute 0.0 0.0 - 0.1 K/uL  Basic metabolic panel  Result Value Ref Range   Sodium 135 135 - 145 mmol/L   Potassium 5.0 3.5 - 5.1 mmol/L   Chloride 104 101 - 111 mmol/L   CO2 23 22 - 32 mmol/L   Glucose, Bld 159 (H) 65 - 99 mg/dL   BUN 40 (H) 6 - 20 mg/dL   Creatinine, Ser 1.79 (H) 0.61 - 1.24 mg/dL   Calcium 9.0 8.9 - 10.3 mg/dL   GFR calc non Af Amer 42 (L) >60 mL/min   GFR calc Af Amer 48 (L) >60 mL/min    Anion gap 8 5 - 15   Dg Chest 2 View  Result Date: 05/15/2017 CLINICAL  DATA:  Short of breath, productive cough EXAM: CHEST  2 VIEW COMPARISON:  05/26/2016, CT chest 05/26/2016 FINDINGS: Mild hyperinflation. No acute consolidation or pleural effusion is seen. Focal scarring in the right apex. Posttraumatic deformity of the right ribs and thoracic cage as before. Pleural scarring at the right base. Normal cardiomediastinal silhouette. No pneumothorax. Old distal right clavicle fracture. Chronic compression of T12 unchanged. Degenerative changes. IMPRESSION: No active cardiopulmonary disease. Old posttraumatic deformity of the right thorax with areas of pleural and parenchymal scarring Electronically Signed   By: Donavan Foil M.D.   On: 05/15/2017 16:30   Initial Impression / Assessment and Plan / ED Course  I have reviewed the triage vital signs and the nursing notes.  Pertinent labs & imaging results that were available during my care of the patient were reviewed by me and considered in my medical decision making (see chart for details).     In light of immunocompromise state and chronicity of symptoms we'll treat with antibiotic. Plan prescription doxycycline. Albuterol HFA with spacer to go to use 2 puffs every 4 hours as needed for cough or shortness of breath. Return if needed more than every 4 hours IV advised him that he needs to see his primary care physician for recheck of renal function within a month. Blood pressure recheck one week  Final Clinical Impressions(s) / ED Diagnoses  Diagnosis #1 acute bronchitis #2 hyperglycemia #3 renal insufficiency #4 elevated blood pressure Final diagnoses:  None    New Prescriptions New Prescriptions   No medications on file     Orlie Dakin, MD 05/15/17 1856 Addendum patient was given inhaler in the emergency department which he tried which sent him into a coughing fit for several minutes, which resolved spontaneously. He coughed up  clear material which looks like saliva. Advised him that he doesn't not need to take the inhaler. And we will discontinue it. He'll be prescribed doxycycline   Orlie Dakin, MD 05/15/17 [phone]

## 2017-05-26 ENCOUNTER — Encounter: Payer: No Typology Code available for payment source | Attending: Physical Medicine & Rehabilitation

## 2017-05-26 ENCOUNTER — Ambulatory Visit (HOSPITAL_BASED_OUTPATIENT_CLINIC_OR_DEPARTMENT_OTHER): Payer: No Typology Code available for payment source | Admitting: Physical Medicine & Rehabilitation

## 2017-05-26 ENCOUNTER — Encounter: Payer: Self-pay | Admitting: Physical Medicine & Rehabilitation

## 2017-05-26 VITALS — BP 149/73 | HR 65 | Resp 14

## 2017-05-26 DIAGNOSIS — G8929 Other chronic pain: Secondary | ICD-10-CM | POA: Insufficient documentation

## 2017-05-26 DIAGNOSIS — M4316 Spondylolisthesis, lumbar region: Secondary | ICD-10-CM | POA: Insufficient documentation

## 2017-05-26 DIAGNOSIS — M47816 Spondylosis without myelopathy or radiculopathy, lumbar region: Secondary | ICD-10-CM

## 2017-05-26 DIAGNOSIS — M5030 Other cervical disc degeneration, unspecified cervical region: Secondary | ICD-10-CM | POA: Insufficient documentation

## 2017-05-26 DIAGNOSIS — R0781 Pleurodynia: Secondary | ICD-10-CM | POA: Insufficient documentation

## 2017-05-26 DIAGNOSIS — Z9889 Other specified postprocedural states: Secondary | ICD-10-CM | POA: Insufficient documentation

## 2017-05-26 NOTE — Patient Instructions (Addendum)
Lumbar medial branch blocks were performed. This is to help diagnose the cause of the low back pain. It is important that you keep track of your pain for the first day or 2 after injection. This injection can give you temporary relief that lasts for hours or up to several months. There is no way to predict duration of pain relief.  Please try to compare your pain after injection to for the injection.  If this injection gives you  temporary relief there may be another longer-lasting procedure that may be beneficial call radiofrequency ablation  Discontinue Meloxicam

## 2017-05-26 NOTE — Progress Notes (Signed)
Left Lumbar L3, L4  medial branch blocks and L 5 dorsal ramus injection under fluoroscopic guidance   Indication: Left Lumbar pain which is not relieved by medication management or other conservative care and interfering with self-care and mobility.  Informed consent was obtained after describing risks and benefits of the procedure with the patient, this includes bleeding, bruising, infection, paralysis and medication side effects.  The patient wishes to proceed and has given written consent.  The patient was placed in a prone position.  The lumbar area was marked and prepped with Betadine.  One mL of 1% lidocaine was injected into each of 3 areas into the skin and subcutaneous tissue.  Then a 22-gauge 5in spinal needle was inserted targeting the junction of the left S1 superior articular process and sacral ala junction.  Needle was advanced under fluoroscopic guidance.  Bone contact was made. Isovue 200 was injected x 0.5 mL demonstrating no intravascular uptake.  Then a solution containing  2% MPF lidocaine was injected x 0.5 mL.  Then the left L5 superior articular process in transverse process junction was targeted.  Bone contact was made. Isovue 200 was injected x 0.5 mL demonstrating no intravascular uptake.  Then a solution containing 2% MPF lidocaine was injected x 0.5 mL.  Then the left L4 superior articular process in transverse process junction was targeted.  Bone contact was made.  Isovue 200 was injected x 0.5 mL demonstrating no intravascular uptake.  Then a solution containing  2% MPF lidocaine was injected x 0.5 mL.  Patient tolerated procedure well.  Post procedure instructions were given.  At end of procedure, patient showed me a note from another healthcare provider advising him to see PCP for elevated creatinine, I have advised patient to discontinue meloxicam

## 2017-05-26 NOTE — Progress Notes (Signed)
  [facility]   Name: Jeremy Burgess DOB:11-23-1965 MRN: [id number]  Date:05/26/2017  Physician: Alysia Penna, MD    Nurse/CMA: Elvert Cumpton, CMA  Allergies:  Allergies  Allergen Reactions  . Bee Venom Anaphylaxis    Intolerance HONEY BEES    Consent Signed: Yes.    Is patient diabetic? Yes.    CBG today? 46  Pregnant: No. LMP: No LMP for male patient. (age 53-55)  Anticoagulants: no Anti-inflammatory: no Antibiotics: no  Procedure: left medial branch block L3-5  Position: Prone Start Time: 1:23pm  End Time: 1:31pm  Fluoro Time: 40  RN/CMA Crislyn Willbanks, CMA Lotus Santillo, CMA    Time 1:00pm 1:36pm    BP 149/73 161/82    Pulse 65 64    Respirations 14 14    O2 Sat 98 97    S/S 6 6    Pain Level 8/10 5/10     D/C home with brother, patient A & O X 3, D/C instructions reviewed, and sits independently.

## 2017-06-23 ENCOUNTER — Encounter: Payer: No Typology Code available for payment source | Attending: Physical Medicine & Rehabilitation

## 2017-06-23 ENCOUNTER — Ambulatory Visit (HOSPITAL_BASED_OUTPATIENT_CLINIC_OR_DEPARTMENT_OTHER): Payer: No Typology Code available for payment source | Admitting: Physical Medicine & Rehabilitation

## 2017-06-23 ENCOUNTER — Encounter: Payer: Self-pay | Admitting: Physical Medicine & Rehabilitation

## 2017-06-23 VITALS — BP 173/77 | HR 76

## 2017-06-23 DIAGNOSIS — M5030 Other cervical disc degeneration, unspecified cervical region: Secondary | ICD-10-CM | POA: Insufficient documentation

## 2017-06-23 DIAGNOSIS — G8929 Other chronic pain: Secondary | ICD-10-CM

## 2017-06-23 DIAGNOSIS — M4316 Spondylolisthesis, lumbar region: Secondary | ICD-10-CM | POA: Insufficient documentation

## 2017-06-23 DIAGNOSIS — R0781 Pleurodynia: Secondary | ICD-10-CM | POA: Insufficient documentation

## 2017-06-23 DIAGNOSIS — M79672 Pain in left foot: Secondary | ICD-10-CM

## 2017-06-23 DIAGNOSIS — M545 Low back pain: Secondary | ICD-10-CM

## 2017-06-23 DIAGNOSIS — Z9889 Other specified postprocedural states: Secondary | ICD-10-CM | POA: Insufficient documentation

## 2017-06-23 NOTE — Progress Notes (Signed)
Subjective:    Patient ID: Jeremy Burgess, male    DOB: January 15, 1966, 51 y.o.   MRN: [id number]  HPI  Fall last Thursday on his left side.  This was a floor level fall.  He has had increased left-sided low back pain left leg pain as well as left foot pain since that time.  He notes some pain running down from his back to his foot as well. He has not sought any medical attention.  Patient states the pain has not improved over time.  Pain Inventory Average Pain 9 Pain Right Now 9 My pain is sharp, stabbing and aching  In the last 24 hours, has pain interfered with the following? General activity 0 Relation with others 2 Enjoyment of life 0 What TIME of day is your pain at its worst? daytime Sleep (in general) Poor  Pain is worse with: walking, bending, sitting, inactivity and standing Pain improves with: medication and injections Relief from Meds: 1  Mobility use a cane how many minutes can you walk? 3 ability to climb steps?  no do you drive?  no Do you have any goals in this area?  yes  Function I need assistance with the following:  meal prep, household duties and shopping Do you have any goals in this area?  yes  Neuro/Psych weakness numbness trouble walking spasms confusion depression anxiety  Prior Studies Any changes since last visit?  no  Physicians involved in your care Any changes since last visit?  yes   Family History  Problem Relation Age of Onset  . Diabetes Mother   . COPD Mother   . Diabetes Sister   . Colon cancer Neg Hx    Social History   Socioeconomic History  . Marital status: Single    Spouse name: None  . Number of children: None  . Years of education: None  . Highest education level: None  Social Needs  . Financial resource strain: None  . Food insecurity - worry: None  . Food insecurity - inability: None  . Transportation needs - medical: None  . Transportation needs - non-medical: None  Occupational History  . None    Tobacco Use  . Smoking status: Never Smoker  . Smokeless tobacco: Never Used  Substance and Sexual Activity  . Alcohol use: No    Comment: history of alcohol abuse but quit in 2008   . Drug use: No  . Sexual activity: Yes  Other Topics Concern  . None  Social History Narrative   ** Merged History Encounter **       Past Surgical History:  Procedure Laterality Date  . BACK SURGERY    . ESOPHAGOGASTRODUODENOSCOPY  2009   EGD completed by Dr. [name] due to hematemesis. Findings of normal esophagus, tiny antral erosions, normal D1, D2  . NOSE SURGERY     Past Medical History:  Diagnosis Date  . Accidentally struck by falling tree 02/08/2015  . Acute blood loss anemia 02/09/2015  . Acute respiratory failure ([place]) 02/09/2015  . Arthritis   . Chronic pain due to trauma   . Diabetes mellitus   . Diabetes mellitus ([place])   . Heart murmur   . History of blood clots    DVT  . Hypertension   . Pneumothorax   . Postlaminectomy syndrome, lumbar region 11/13/2015  . Rib fracture   . Smoker 0   denies smoking  . Thrombocytopenia ([place]) 02/09/2015  . Traumatic closed displaced fracture of multiple ribs of  right side 02/08/2015  . Traumatic hemopneumothorax 02/09/2015  . Upper GI bleeding 05/27/2016   Trivial. EGD unremarkable.   BP (!) 173/77   Pulse 76   SpO2 96%   Opioid Risk Score:   Fall Risk Score:  `1  Depression screen PHQ 2/9  Depression screen [facility] 2/9 06/23/2017 10/14/2016 12/11/2015 11/13/2015  Decreased Interest 1 3 3 3   Down, Depressed, Hopeless 1 3 3 3   PHQ - 2 Score 2 6 6 6   Altered sleeping - - - 3  Tired, decreased energy - - - 3  Change in appetite - - - 3  Feeling bad or failure about yourself  - - - 3  Trouble concentrating - - - 3  Moving slowly or fidgety/restless - - - 3  Suicidal thoughts - - - 0  PHQ-9 Score - - - 24     Review of Systems  Constitutional: Positive for fever.       Night sweats, easy bleeding, and high blood sugar  HENT: Negative.    Eyes: Negative.   Respiratory: Positive for cough, shortness of breath and wheezing.   Cardiovascular: Positive for leg swelling.       Infections  Gastrointestinal: Negative.   Endocrine: Negative.   Genitourinary: Negative.   Musculoskeletal: Negative.   Skin: Negative.   Allergic/Immunologic: Negative.   Neurological: Negative.   Hematological: Negative.   Psychiatric/Behavioral: Negative.        Objective:   Physical Exam  Constitutional: He is oriented to person, place, and time. He appears well-developed and well-nourished.  HENT:  Head: Normocephalic and atraumatic.  Eyes: Conjunctivae and EOM are normal. Pupils are equal, round, and reactive to light.  Neck: Normal range of motion.  Musculoskeletal:       Left hip: He exhibits normal range of motion, no tenderness and no deformity.       Left ankle: He exhibits decreased range of motion and swelling. Tenderness. Head of 5th metatarsal tenderness found. No proximal fibula tenderness found.       Left lower leg: He exhibits tenderness. He exhibits no deformity.  Neurological: He is alert and oriented to person, place, and time.  Psychiatric: He has a normal mood and affect.  Nursing note and vitals reviewed. The dorsum of the left foot has mild tenderness palpation over the heads of the fourth and fifth metatarsals.  There is mild diffuse tenderness over the ankle area no ankle deformity has mildly diminished range of motion with dorsiflexion plantarflexion as well as inversion eversion.  The left lateral leg has some soft tissue swelling at the midpoint.  No bony tenderness.  No crepitus. Left hip has no tenderness over the greater trochanter no ecchymosis over the lateral hip or posterior hip area.  Lumbar there is no evidence of ecchymosis.  He has tenderness to palpation left lumbosacral paraspinal area.       Assessment & Plan:  1.  Left sided low back left foot pain related to fall. His ankle pain appears to be  soft tissue.  He is able to weight-bear. We will order left foot x-rays as well as low back x-ray. 2.  History of lumbar spondylosis acute exacerbation of chronic low back pain.  We will hold off on lumbar radiofrequency until x-rays are performed and evaluated.  We will see him back in about 2 weeks with planned L3-L4 medial branch and left L5 dorsal ramus RFA. Over-the-counter analgesics for pain.

## 2017-06-25 ENCOUNTER — Ambulatory Visit (HOSPITAL_COMMUNITY)
Admission: RE | Admit: 2017-06-25 | Discharge: 2017-06-25 | Disposition: A | Discharge status: Home / Self Care | Payer: Self-pay | Source: Ambulatory Visit | Attending: Physical Medicine & Rehabilitation | Admitting: Physical Medicine & Rehabilitation

## 2017-06-25 DIAGNOSIS — M79672 Pain in left foot: Secondary | ICD-10-CM | POA: Insufficient documentation

## 2017-06-25 DIAGNOSIS — M7732 Calcaneal spur, left foot: Secondary | ICD-10-CM | POA: Insufficient documentation

## 2017-06-25 DIAGNOSIS — G8929 Other chronic pain: Secondary | ICD-10-CM | POA: Insufficient documentation

## 2017-06-25 DIAGNOSIS — M5137 Other intervertebral disc degeneration, lumbosacral region: Secondary | ICD-10-CM | POA: Insufficient documentation

## 2017-06-25 DIAGNOSIS — M545 Low back pain: Secondary | ICD-10-CM | POA: Insufficient documentation

## 2017-07-01 ENCOUNTER — Telehealth: Payer: Self-pay

## 2017-07-01 ENCOUNTER — Ambulatory Visit (INDEPENDENT_AMBULATORY_CARE_PROVIDER_SITE_OTHER): Payer: Self-pay | Admitting: Gastroenterology

## 2017-07-01 ENCOUNTER — Encounter: Payer: Self-pay | Admitting: Gastroenterology

## 2017-07-01 ENCOUNTER — Other Ambulatory Visit: Payer: Self-pay | Admitting: *Deleted

## 2017-07-01 VITALS — BP 151/86 | HR 71 | Temp 97.7°F | Ht 70.0 in | Wt 236.6 lb

## 2017-07-01 DIAGNOSIS — R1011 Right upper quadrant pain: Secondary | ICD-10-CM

## 2017-07-01 NOTE — Telephone Encounter (Signed)
Great!

## 2017-07-01 NOTE — Assessment & Plan Note (Addendum)
51 year old pleasant male with chronic right-sided abdominal pain, s/p injury from tree last June. He notes underlying RUQ pain that is exacerbated with eating, associated bloating. Cholecystectomy completed in March 2018 due to gallstones and +reproducible symptoms with HIDA (although normal EF at 46%). EGD fairly recently normal, colonoscopy up-to-date. CT has been unrevealing. GES normal. At this point, I am referring him for tertiary evaluation due to chronic RUQ pain. Will change Protonix to Dexilant once daily. No alarm features at this time.  As of note, he had a bump in his creatinine when seen in the ED recently. We will facilitate getting him back to his PCP in a timely manner for further evaluation.   Return to see [place] in 6 months.

## 2017-07-01 NOTE — Patient Instructions (Signed)
Stop Protonix. Let's try the Dexilant samples. Let me know if this works for you.  I am referring you to [facility] for further evaluation of chronic right-sided abdominal pain.  I am also trying to get you into see your PCP quicker.   We will see you back in 6 months!

## 2017-07-01 NOTE — Progress Notes (Signed)
cc'ed to pcp °

## 2017-07-01 NOTE — Progress Notes (Signed)
Referring Provider: Raiford Simmonds., PA-C Primary Care Physician:  Raiford Simmonds., PA-C Primary GI: Dr. [name]   Chief Complaint  Patient presents with  . right side pain    HPI:   Jeremy Burgess is a 51 y.o. male presenting today with a history of chronic RUQ pain since June 2017 after a tree fell on him. CT in October with multiple remotely healed right-sided rib fractures. LFTs normal. EGD on file that is normal. Colonoscopy with adenoma recently and due for surveillance in 5 years. Underwent cholecystectomy due to chronic cholecystitis. When last seen in Aug 2018, his RUQ Pain had improved. GES was ordered due to gas/bloating, and this was normal. Recommended low FODMAP diet. Weight is stable since Aug 2018.   On Protonix once a day. Will eat/drink something and feels like RUQ is bloated and pain worsens. Notes discomfort in RUQ that is constant. No diarrhea or constipation. No typical GERD symptoms.   Past Medical History:  Diagnosis Date  . Accidentally struck by falling tree 02/08/2015  . Acute blood loss anemia 02/09/2015  . Acute respiratory failure ([place]) 02/09/2015  . Arthritis   . Chronic pain due to trauma   . Diabetes mellitus   . Diabetes mellitus ([place])   . Heart murmur   . History of blood clots    DVT  . Hypertension   . Pneumothorax   . Postlaminectomy syndrome, lumbar region 11/13/2015  . Rib fracture   . Smoker 0   denies smoking  . Thrombocytopenia ([place]) 02/09/2015  . Traumatic closed displaced fracture of multiple ribs of right side 02/08/2015  . Traumatic hemopneumothorax 02/09/2015  . Upper GI bleeding 05/27/2016   Trivial. EGD unremarkable.    Past Surgical History:  Procedure Laterality Date  . BACK SURGERY    . CHOLECYSTECTOMY N/A 10/30/2016   Procedure: LAPAROSCOPIC CHOLECYSTECTOMY;  Surgeon: [name], MD;  Location: [facility];  Service: General;  Laterality: N/A;  . COLONOSCOPY N/A 07/25/2016   Dr. [name]: 9 mm tubular adenoma. 5 year  surveillance   . ESOPHAGOGASTRODUODENOSCOPY  2009   EGD completed by Dr. [name] due to hematemesis. Findings of normal esophagus, tiny antral erosions, normal D1, D2  . ESOPHAGOGASTRODUODENOSCOPY N/A 05/27/2016   Dr. [name]: normal   . EXTERNAL FIXATION LEG Right 02/09/2015   Procedure: EXTERNAL FIXATION LEG;  Surgeon: [name], MD;  Location: [place];  Service: Orthopedics;  Laterality: Right;  . EXTERNAL FIXATION REMOVAL Right 02/14/2015   Procedure: REMOVAL EXTERNAL FIXATION LEG;  Surgeon: [name], MD;  Location: [place];  Service: Orthopedics;  Laterality: Right;  . FEMUR IM NAIL Right 02/09/2015   Procedure: INTRAMEDULLARY (IM) RETROGRADE FEMORAL NAILING;  Surgeon: [name], MD;  Location: [place];  Service: Orthopedics;  Laterality: Right;  . NOSE SURGERY    . ORIF ANKLE FRACTURE Right 02/14/2015   Procedure: OPEN REDUCTION INTERNAL FIXATION (ORIF) PILON FRACTURE;  Surgeon: [name], MD;  Location: [place];  Service: Orthopedics;  Laterality: Right;  . ORIF ULNAR FRACTURE Right 02/09/2015   Procedure: OPEN REDUCTION INTERNAL FIXATION (ORIF) ULNAR FRACTURE;  Surgeon: [name], MD;  Location: [place];  Service: Orthopedics;  Laterality: Right;  . POLYPECTOMY  07/25/2016   Procedure: POLYPECTOMY;  Surgeon: [name], MD;  [facility];  Service: Endoscopy;;  colon    Current Outpatient Medications  Medication Sig Dispense Refill  . gabapentin (NEURONTIN) 100 MG capsule Take 1 capsule (100 mg  total) by mouth 2 (two) times daily. 60 capsule 1  . insulin detemir (LEVEMIR) 100 UNIT/ML injection Inject 0.6 mLs (60 Units total) into the skin at bedtime.    [name] lisinopril (PRINIVIL,ZESTRIL) 20 MG tablet Take 20 mg by mouth daily.     . pantoprazole (PROTONIX) 40 MG tablet Take 1 tablet (40 mg total) by mouth daily. Take 30 minutes before breakfast 90 tablet 3  . sitaGLIPtin-metformin (JANUMET) 50-1000 MG tablet Take 1 tablet by mouth 2 (two) times daily with  a meal.    . doxycycline (VIBRAMYCIN) 100 MG capsule Take 1 capsule (100 mg total) by mouth 2 (two) times daily. One po bid x 7 days (Patient not taking: Reported on 07/01/2017) 14 capsule 0   No current facility-administered medications for this visit.     Allergies as of 07/01/2017 - Review Complete 07/01/2017  Allergen Reaction Noted  . Bee venom Anaphylaxis 02/08/2015    Family History  Problem Relation Age of Onset  . Diabetes Mother   . COPD Mother   . Diabetes Sister   . Colon cancer Neg Hx     Social History   Socioeconomic History  . Marital status: Single    Spouse name: None  . Number of children: None  . Years of education: None  . Highest education level: None  Social Needs  . Financial resource strain: None  . Food insecurity - worry: None  . Food insecurity - inability: None  . Transportation needs - medical: None  . Transportation needs - non-medical: None  Occupational History  . None  Tobacco Use  . Smoking status: Never Smoker  . Smokeless tobacco: Never Used  Substance and Sexual Activity  . Alcohol use: No    Comment: history of alcohol abuse but quit in 2008   . Drug use: No  . Sexual activity: Yes  Other Topics Concern  . None  Social History Narrative   ** Merged History Encounter **        Review of Systems: Gen: Denies fever, chills, anorexia. Denies fatigue, weakness, weight loss.  CV: Denies chest pain, palpitations, syncope, peripheral edema, and claudication. Resp: Denies dyspnea at rest, cough, wheezing, coughing up blood, and pleurisy. GI: see HPI  Derm: Denies rash, itching, dry skin Psych: Denies depression, anxiety, memory loss, confusion. No homicidal or suicidal ideation.  Heme: Denies bruising, bleeding, and enlarged lymph nodes.  Physical Exam: BP (!) 151/86   Pulse 71   Temp 97.7 F (36.5 C) (Oral)   Ht 5\' 10"  (1.778 m)   Wt 236 lb 9.6 oz (107.3 kg)   BMI 33.95 kg/m  General:   Alert and oriented. No distress  noted. Pleasant and cooperative.  Head:  Normocephalic and atraumatic. Eyes:  Conjuctiva clear without scleral icterus. Mouth:  Oral mucosa pink and moist.  Abdomen:  +BS, soft, mild to moderate TTP RUQ and non-distended. No rebound or guarding. Diastasis recti vs ventral hernia Extremities:  Without edema. Neurologic:  Alert and  oriented x4 Psych:  Alert and cooperative. Normal mood and affect.  Lab Results  Component Value Date   CREATININE 1.79 (H) 05/15/2017   BUN 40 (H) 05/15/2017   NA 135 05/15/2017   K 5.0 05/15/2017   CL 104 05/15/2017   CO2 23 05/15/2017

## 2017-07-01 NOTE — Telephone Encounter (Signed)
I called the [facility] Department to see if pts appointment could be moved up. They found an appointment for 07/22/17 and are going to contact pt to see if he can do that appointment.

## 2017-07-15 ENCOUNTER — Ambulatory Visit: Payer: No Typology Code available for payment source | Admitting: Physical Medicine & Rehabilitation

## 2017-07-15 ENCOUNTER — Ambulatory Visit: Payer: No Typology Code available for payment source

## 2017-08-13 ENCOUNTER — Telehealth: Payer: Self-pay | Admitting: *Deleted

## 2017-08-13 NOTE — Telephone Encounter (Signed)
Noted  

## 2017-08-13 NOTE — Telephone Encounter (Signed)
Called [facility] and patient still has not been cleared through financial clearance since pt does not have insurance. Referral was made 07/01/17. Appointment can't be scheduled until this is done. Forwarding to [employer]

## 2017-08-25 NOTE — Telephone Encounter (Signed)
Called [facility] and was advised they mailed financial  Paperwork to patient. They reached out to patient on 08/19/17 and he advised them he has not mailed back the paperwork and would do so in the next month. [facility] has deferred the referral for now until he returns the packet back. FYI to [place].

## 2017-12-30 ENCOUNTER — Ambulatory Visit: Payer: No Typology Code available for payment source | Admitting: Gastroenterology

## 2017-12-30 ENCOUNTER — Encounter: Payer: Self-pay | Admitting: Gastroenterology

## 2018-02-01 ENCOUNTER — Emergency Department (HOSPITAL_COMMUNITY): Payer: Medicaid Other

## 2018-02-01 ENCOUNTER — Encounter (HOSPITAL_COMMUNITY): Payer: Self-pay | Admitting: *Deleted

## 2018-02-01 ENCOUNTER — Emergency Department (HOSPITAL_COMMUNITY)
Admission: EM | Admit: 2018-02-01 | Discharge: 2018-02-01 | Disposition: A | Discharge status: Home / Self Care | Payer: Medicaid Other | Attending: Emergency Medicine | Admitting: Emergency Medicine

## 2018-02-01 ENCOUNTER — Other Ambulatory Visit: Payer: Self-pay

## 2018-02-01 DIAGNOSIS — Z79899 Other long term (current) drug therapy: Secondary | ICD-10-CM | POA: Insufficient documentation

## 2018-02-01 DIAGNOSIS — I10 Essential (primary) hypertension: Secondary | ICD-10-CM | POA: Insufficient documentation

## 2018-02-01 DIAGNOSIS — Z794 Long term (current) use of insulin: Secondary | ICD-10-CM | POA: Insufficient documentation

## 2018-02-01 DIAGNOSIS — M25562 Pain in left knee: Secondary | ICD-10-CM | POA: Diagnosis present

## 2018-02-01 DIAGNOSIS — E119 Type 2 diabetes mellitus without complications: Secondary | ICD-10-CM | POA: Diagnosis not present

## 2018-02-01 MED ORDER — HYDROCODONE-ACETAMINOPHEN 5-325 MG PO TABS: 1.0000 | ORAL_TABLET | Freq: Three times a day (TID) | ORAL | 0 refills | Status: DC | PRN

## 2018-02-01 NOTE — ED Triage Notes (Signed)
Pt c/o left knee pain that became worse a week ago, denies any recent injury,

## 2018-02-01 NOTE — Discharge Instructions (Addendum)
You were given a prescription for pain medication. You may take one dose of pain medication every 8 hours as needed for pain. Do not drive, work, or operate machinery while you are taking this medication. You should follow up with your regular doctor in 2-3 days for re-evaluation. If your symptoms do not improve in the next 1-2 days are they worsen then you will need to return to the emergency department. If you develop fevers, knee swelling, or any new or worsening symptoms please return to the emergency department immediately.

## 2018-02-01 NOTE — ED Provider Notes (Signed)
[facility] EMERGENCY DEPARTMENT Provider Note   CSN: [id number] Arrival date & time: 02/01/18  8469     History   Chief Complaint Chief Complaint  Patient presents with  . Knee Pain    HPI Jeremy Burgess is a 52 y.o. male.  HPI   Patient is a 52 year old male with a history of diabetes and hypertension, who presents to the emergency department today complaining of left knee pain that has been present chronically however began to worsen about 1 week ago.  He had prior injury after a tree fell on him about 3 years ago which has caused chronic intermittent pain to the bilateral lower extremities including the left knee.  States that for the last week left knee pain seems to be worse to the anterior aspect of the knee.  Denies pain to the posterior aspect of the knee. Feels that the knee is warmer than normal. Pain is worse with palpation. States he even has pain when a blanket lays on top of his knee. Denies any redness. Has decreased range of motion of the knee at baseline which he states has been worsening since his injury three years ago. Denies a h/o gout.   Has h/o DVT after his initial injury of having a tree fall on him 3 years ago. He is no longer anticoagulated and has never had another DVT.   Past Medical History:  Diagnosis Date  . Accidentally struck by falling tree 02/08/2015  . Acute blood loss anemia 02/09/2015  . Acute respiratory failure ([place]) 02/09/2015  . Arthritis   . Chronic pain due to trauma   . Diabetes mellitus   . Diabetes mellitus ([place])   . Heart murmur   . History of blood clots    DVT  . Hypertension   . Pneumothorax   . Postlaminectomy syndrome, lumbar region 11/13/2015  . Rib fracture   . Smoker 0   denies smoking  . Thrombocytopenia ([place]) 02/09/2015  . Traumatic closed displaced fracture of multiple ribs of right side 02/08/2015  . Traumatic hemopneumothorax 02/09/2015  . Upper GI bleeding 05/27/2016   Trivial. EGD unremarkable.    Patient  Active Problem List   Diagnosis Date Noted  . Dyspepsia 03/25/2017  . Calculus of gallbladder without cholecystitis without obstruction   . Encounter for screening colonoscopy 07/01/2016  . RUQ pain 07/01/2016  . AKI (acute kidney injury) ([place]) 05/27/2016  . Upper GI bleeding 05/27/2016  . Upper GI bleed 05/26/2016  . Chronic pain due to trauma 12/11/2015  . Postlaminectomy syndrome, lumbar region 11/13/2015  . Anterior knee pain   . Pain in limb   . Closed right pilon fracture 02/23/2015  . Ankle fracture, right 02/09/2015  . Acute blood loss anemia 02/09/2015  . Thrombocytopenia ([place]) 02/09/2015  . Acute renal failure ([place]) 02/09/2015  . Acute respiratory failure ([place]) 02/09/2015  . Traumatic hemopneumothorax 02/09/2015  . HTN (hypertension) 02/09/2015  . Accidentally struck by falling tree 02/08/2015  . Insulin dependent diabetes mellitus ([place]) 02/08/2015  . Traumatic closed displaced fracture of multiple ribs of right side 02/08/2015  . Closed fracture of right ulna 02/08/2015  . Femur fracture, right ([place]) 02/08/2015    Past Surgical History:  Procedure Laterality Date  . BACK SURGERY    . CHOLECYSTECTOMY N/A 10/30/2016   Procedure: LAPAROSCOPIC CHOLECYSTECTOMY;  Surgeon: [name], MD;  Location: [facility];  Service: General;  Laterality: N/A;  . COLONOSCOPY N/A 07/25/2016   Dr. [name]: 9 mm tubular adenoma. 5  year surveillance   . ESOPHAGOGASTRODUODENOSCOPY  2009   EGD completed by Dr. [name] due to hematemesis. Findings of normal esophagus, tiny antral erosions, normal D1, D2  . ESOPHAGOGASTRODUODENOSCOPY N/A 05/27/2016   Dr. [name]: normal   . EXTERNAL FIXATION LEG Right 02/09/2015   Procedure: EXTERNAL FIXATION LEG;  Surgeon: [name], MD;  Location: [place];  Service: Orthopedics;  Laterality: Right;  . EXTERNAL FIXATION REMOVAL Right 02/14/2015   Procedure: REMOVAL EXTERNAL FIXATION LEG;  Surgeon: [name], MD;  Location: [place];  Service: Orthopedics;   Laterality: Right;  . FEMUR IM NAIL Right 02/09/2015   Procedure: INTRAMEDULLARY (IM) RETROGRADE FEMORAL NAILING;  Surgeon: [name], MD;  Location: [place];  Service: Orthopedics;  Laterality: Right;  . NOSE SURGERY    . ORIF ANKLE FRACTURE Right 02/14/2015   Procedure: OPEN REDUCTION INTERNAL FIXATION (ORIF) PILON FRACTURE;  Surgeon: [name], MD;  Location: [place];  Service: Orthopedics;  Laterality: Right;  . ORIF ULNAR FRACTURE Right 02/09/2015   Procedure: OPEN REDUCTION INTERNAL FIXATION (ORIF) ULNAR FRACTURE;  Surgeon: [name], MD;  Location: [place];  Service: Orthopedics;  Laterality: Right;  . POLYPECTOMY  07/25/2016   Procedure: POLYPECTOMY;  Surgeon: [name], MD;  [facility];  Service: Endoscopy;;  colon        Home Medications    Prior to Admission medications   Medication Sig Start Date End Date Taking? Authorizing Provider  gabapentin (NEURONTIN) 100 MG capsule Take 1 capsule (100 mg total) by mouth 2 (two) times daily. 04/28/17   Kirsteins, Luanna Salk, MD  HYDROcodone-acetaminophen (NORCO/VICODIN) 5-325 MG tablet Take 1 tablet by mouth every 8 (eight) hours as needed. 02/01/18   Benjie Ricketson S, PA-C  insulin detemir (LEVEMIR) 100 UNIT/ML injection Inject 0.6 mLs (60 Units total) into the skin at bedtime. 05/28/16   Rexene Alberts, MD  lisinopril (PRINIVIL,ZESTRIL) 20 MG tablet Take 20 mg by mouth daily.     [provider]  pantoprazole (PROTONIX) 40 MG tablet Take 1 tablet (40 mg total) by mouth daily. Take 30 minutes before breakfast 03/25/17   Annitta Needs, NP  sitaGLIPtin-metformin (JANUMET) 50-1000 MG tablet Take 1 tablet by mouth 2 (two) times daily with a meal.    [provider]    Family History Family History  Problem Relation Age of Onset  . Diabetes Mother   . COPD Mother   . Diabetes Sister   . Colon cancer Neg Hx     Social History Social History   Tobacco Use  . Smoking status: Never Smoker    . Smokeless tobacco: Never Used  Substance Use Topics  . Alcohol use: No    Comment: history of alcohol abuse but quit in 2008   . Drug use: No    Allergies   Bee venom   Review of Systems Review of Systems  Constitutional: Negative for fever.  Respiratory: Negative for shortness of breath.   Cardiovascular: Negative for chest pain.  Musculoskeletal:       Left knee pain  Skin: Negative for color change.     Physical Exam Updated Vital Signs BP 116/77   Pulse 60   Temp 97.8 F (36.6 C) (Oral)   Resp 16   SpO2 100%   Physical Exam  Constitutional: He appears well-developed and well-nourished.  HENT:  Head: Normocephalic and atraumatic.  Eyes: Conjunctivae are normal.  Neck: Neck supple.  Cardiovascular: Normal rate and intact  distal pulses.  Pulmonary/Chest: Effort normal and breath sounds normal. No respiratory distress.  Abdominal: Soft. There is no tenderness.  Musculoskeletal:  TTP with compression of the patella. Mild warmth to the left knee compared to right. No erythema or swelling. Chronically decreased ROM of the left knee. Able to extend to 180 degrees.   Neurological: He is alert.  Skin: Skin is warm and dry. Capillary refill takes less than 2 seconds.  Psychiatric: He has a normal mood and affect.  Nursing note and vitals reviewed.  ED Treatments / Results  Labs (all labs ordered are listed, but only abnormal results are displayed) Labs Reviewed - No data to display  EKG None  Radiology Dg Knee Complete 4 Views Left  Result Date: 02/01/2018 CLINICAL DATA:  Left knee pain becoming worse a week ago. No injury. EXAM: LEFT KNEE - COMPLETE 4+ VIEW COMPARISON:  None. FINDINGS: No evidence of fracture dislocation. No significant joint effusion. Mild enthesopathy over the superior pole of the patella. No significant degenerative changes. IMPRESSION: No acute findings. Electronically Signed   By: Marin Olp M.D.   On: 02/01/2018 07:28     Procedures Procedures (including critical care time)  Medications Ordered in ED Medications - No data to display   Initial Impression / Assessment and Plan / ED Course  I have reviewed the triage vital signs and the nursing notes.  Pertinent labs & imaging results that were available during my care of the patient were reviewed by me and considered in my medical decision making (see chart for details).     Final Clinical Impressions(s) / ED Diagnoses   Final diagnoses:  Acute pain of left knee   Pt presenting with knee pain. States he has chronic knee pain but seems to be worsening over the last week. No erythema but some warmth reported. Has chronic decreased ROM of the knee. VSS. No fevers.  On exam knee is mildly warm on left compared to right. No erythema, no effusion. TTP over patella and with compression of the patella. Also states he has pain when blankets touch the knee. Has no h/o gout, however sxs seem most consistent with gout. Less likely septic arthritis as there is no effusion on exam and no effusion seen on xray. Pt has had elevations in his Cr in the past and has a h/o DM so is not a candidate for NSAIDs or steroids. Will give rx for norco for pain. Advised him to f/u with pcp and gave strict return precautions for new or worsening sxs. All questions answered and pt understands the plan.    ED Discharge Orders        Ordered    HYDROcodone-acetaminophen (NORCO/VICODIN) 5-325 MG tablet  Every 8 hours PRN     02/01/18 0845       Rodney Booze, PA-C 02/01/18 1817    Pattricia Boss, MD 02/02/18 [id number]

## 2018-03-28 IMAGING — DX DG FOOT 2V*L*
2 series · 2 of 2 positions shown · non-contrast
Comparison: 11/08/2009

CLINICAL DATA: Fall on [REDACTED]. Left foot pain. Initial encounter.

EXAM:
LEFT FOOT - 2 VIEW

[foot ap]
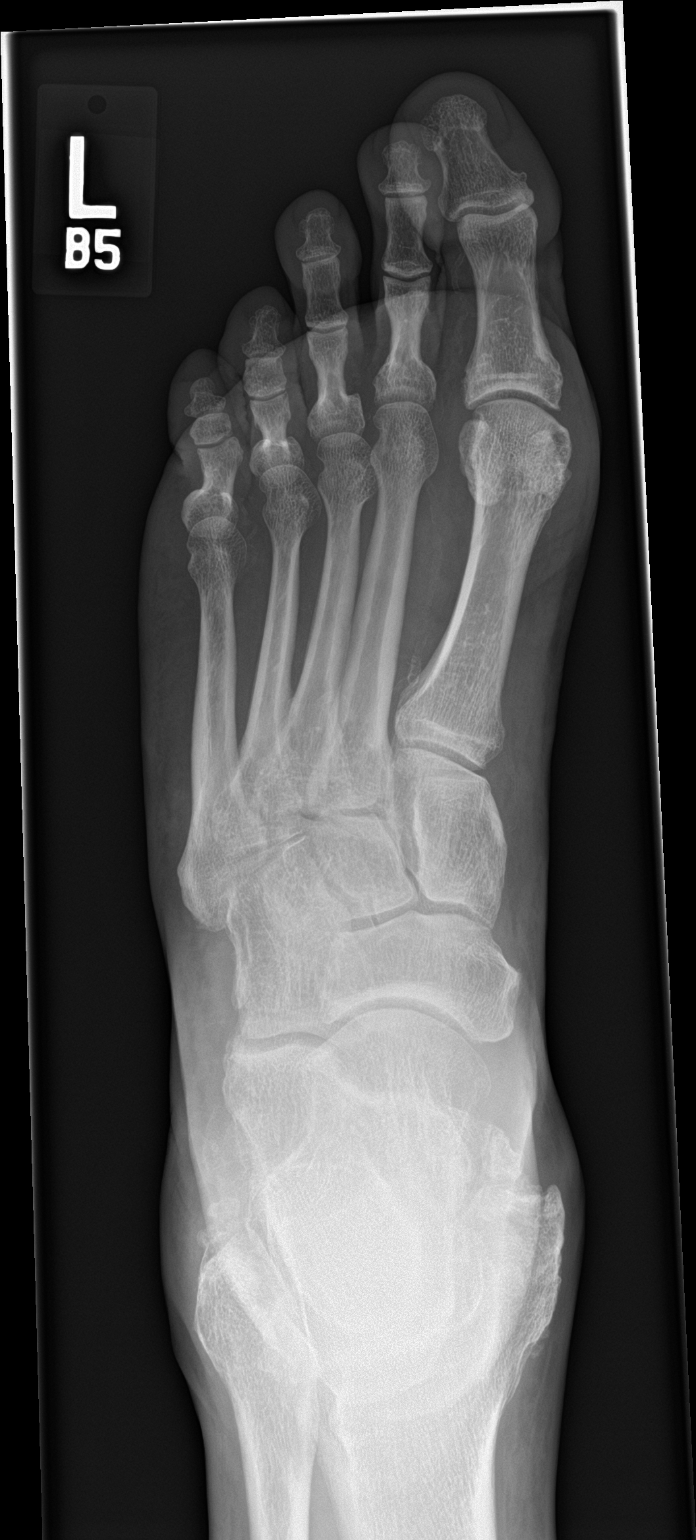

[foot lat]
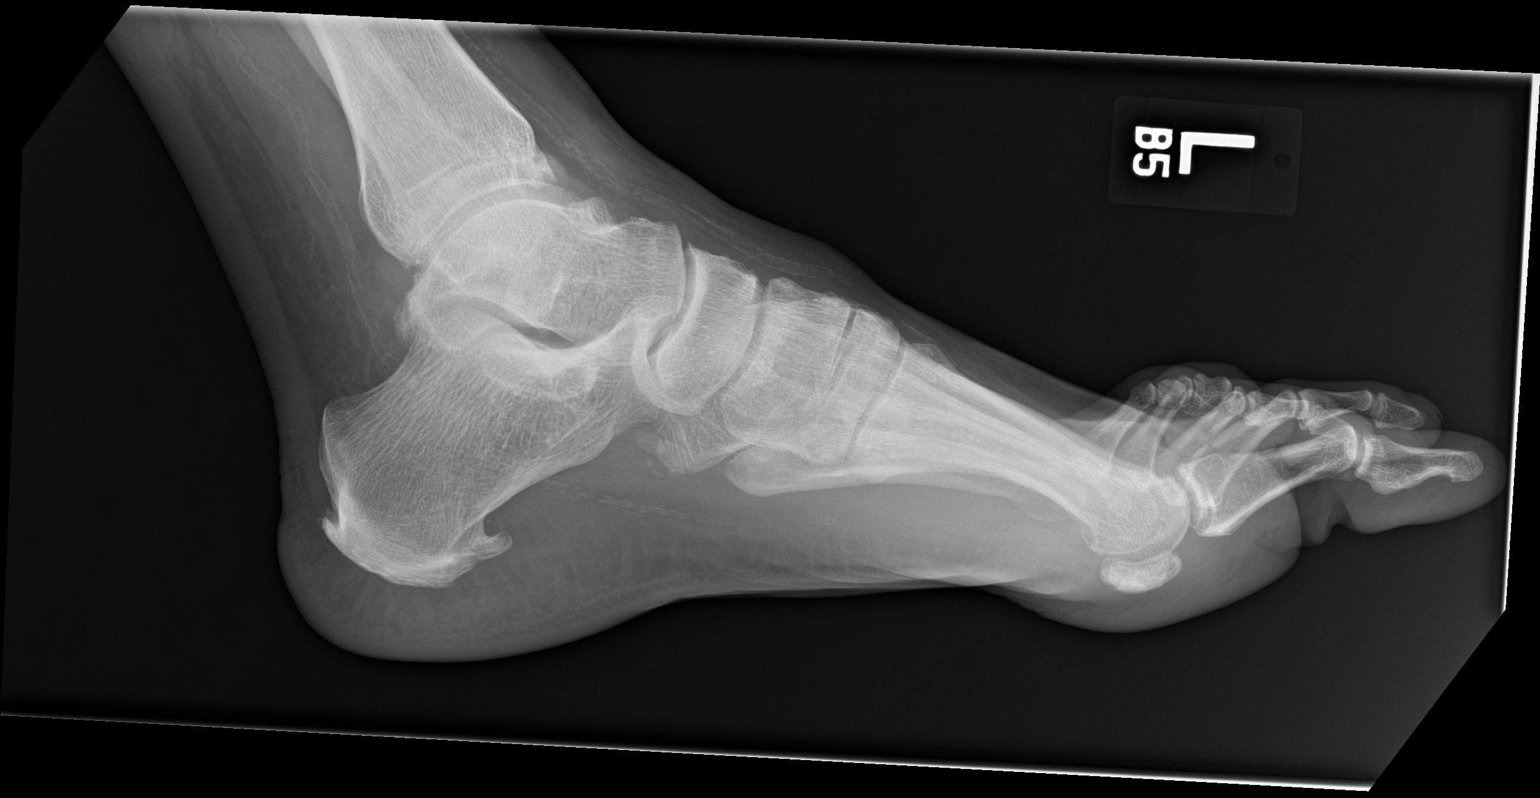

[2 of 2 positions shown; findings below may reference images not displayed]

FINDINGS: No acute fracture or malalignment. Heel spurs. Spurring and ossicles
at the malleoli. Diffuse arterial calcification.
IMPRESSION: 1. No acute finding.
2. Heel spur that is progressed from 5022.
3. Progressive and premature arterial calcification.

## 2018-04-27 ENCOUNTER — Emergency Department (HOSPITAL_COMMUNITY)
Admission: EM | Admit: 2018-04-27 | Discharge: 2018-04-27 | Disposition: A | Discharge status: Home / Self Care | Payer: Medicaid Other | Attending: Emergency Medicine | Admitting: Emergency Medicine

## 2018-04-27 ENCOUNTER — Encounter (HOSPITAL_COMMUNITY): Payer: Self-pay | Admitting: Emergency Medicine

## 2018-04-27 ENCOUNTER — Other Ambulatory Visit: Payer: Self-pay

## 2018-04-27 ENCOUNTER — Emergency Department (HOSPITAL_COMMUNITY): Payer: Medicaid Other

## 2018-04-27 DIAGNOSIS — Z794 Long term (current) use of insulin: Secondary | ICD-10-CM | POA: Diagnosis not present

## 2018-04-27 DIAGNOSIS — X500XXA Overexertion from strenuous movement or load, initial encounter: Secondary | ICD-10-CM | POA: Diagnosis not present

## 2018-04-27 DIAGNOSIS — Y999 Unspecified external cause status: Secondary | ICD-10-CM | POA: Diagnosis not present

## 2018-04-27 DIAGNOSIS — Z79899 Other long term (current) drug therapy: Secondary | ICD-10-CM | POA: Diagnosis not present

## 2018-04-27 DIAGNOSIS — I10 Essential (primary) hypertension: Secondary | ICD-10-CM | POA: Diagnosis not present

## 2018-04-27 DIAGNOSIS — Y9389 Activity, other specified: Secondary | ICD-10-CM | POA: Diagnosis not present

## 2018-04-27 DIAGNOSIS — S6992XA Unspecified injury of left wrist, hand and finger(s), initial encounter: Secondary | ICD-10-CM | POA: Diagnosis present

## 2018-04-27 DIAGNOSIS — S63502A Unspecified sprain of left wrist, initial encounter: Secondary | ICD-10-CM | POA: Insufficient documentation

## 2018-04-27 DIAGNOSIS — Y929 Unspecified place or not applicable: Secondary | ICD-10-CM | POA: Diagnosis not present

## 2018-04-27 DIAGNOSIS — E119 Type 2 diabetes mellitus without complications: Secondary | ICD-10-CM | POA: Insufficient documentation

## 2018-04-27 NOTE — Discharge Instructions (Addendum)
Take over-the-counter medications as needed for pain, try icing to help with swelling and discomfort, follow-up with the primary care doctor or orthopedic doctor if not improving in the next couple of weeks

## 2018-04-27 NOTE — ED Provider Notes (Signed)
[facility] EMERGENCY DEPARTMENT Provider Note   CSN: [id number] Arrival date & time: 04/27/18  1921     History   Chief Complaint Chief Complaint  Patient presents with  . Hand Pain    HPI Jeremy Burgess is a 52 y.o. male.  HPI Patient presents to the emergency room for evaluation of left wrist pain.  Patient states he was getting up out of chair yesterday when he felt acute pain in his left wrist.  States he tried to put an Ace wrap on it but it made it feel worse.  Soreness on the left wrist.  It increases with any movement.  She states he is also had cough and congestion recently.  He is concerned he may be developing pneumonia.  He denies any fevers or chills.  No vomiting or diarrhea Past Medical History:  Diagnosis Date  . Accidentally struck by falling tree 02/08/2015  . Acute blood loss anemia 02/09/2015  . Acute respiratory failure ([place]) 02/09/2015  . Arthritis   . Chronic pain due to trauma   . Diabetes mellitus   . Diabetes mellitus ([place])   . Heart murmur   . History of blood clots    DVT  . Hypertension   . Pneumothorax   . Postlaminectomy syndrome, lumbar region 11/13/2015  . Rib fracture   . Smoker 0   denies smoking  . Thrombocytopenia ([place]) 02/09/2015  . Traumatic closed displaced fracture of multiple ribs of right side 02/08/2015  . Traumatic hemopneumothorax 02/09/2015  . Upper GI bleeding 05/27/2016   Trivial. EGD unremarkable.    Patient Active Problem List   Diagnosis Date Noted  . Dyspepsia 03/25/2017  . Calculus of gallbladder without cholecystitis without obstruction   . Encounter for screening colonoscopy 07/01/2016  . RUQ pain 07/01/2016  . AKI (acute kidney injury) ([place]) 05/27/2016  . Upper GI bleeding 05/27/2016  . Upper GI bleed 05/26/2016  . Chronic pain due to trauma 12/11/2015  . Postlaminectomy syndrome, lumbar region 11/13/2015  . Anterior knee pain   . Pain in limb   . Closed right pilon fracture 02/23/2015  . Ankle fracture,  right 02/09/2015  . Acute blood loss anemia 02/09/2015  . Thrombocytopenia ([place]) 02/09/2015  . Acute renal failure ([place]) 02/09/2015  . Acute respiratory failure ([place]) 02/09/2015  . Traumatic hemopneumothorax 02/09/2015  . HTN (hypertension) 02/09/2015  . Accidentally struck by falling tree 02/08/2015  . Insulin dependent diabetes mellitus ([place]) 02/08/2015  . Traumatic closed displaced fracture of multiple ribs of right side 02/08/2015  . Closed fracture of right ulna 02/08/2015  . Femur fracture, right ([place]) 02/08/2015    Past Surgical History:  Procedure Laterality Date  . BACK SURGERY    . CHOLECYSTECTOMY N/A 10/30/2016   Procedure: LAPAROSCOPIC CHOLECYSTECTOMY;  Surgeon: [name], MD;  Location: [facility];  Service: General;  Laterality: N/A;  . COLONOSCOPY N/A 07/25/2016   Dr. [name]: 9 mm tubular adenoma. 5 year surveillance   . ESOPHAGOGASTRODUODENOSCOPY  2009   EGD completed by Dr. [name] due to hematemesis. Findings of normal esophagus, tiny antral erosions, normal D1, D2  . ESOPHAGOGASTRODUODENOSCOPY N/A 05/27/2016   Dr. [name]: normal   . EXTERNAL FIXATION LEG Right 02/09/2015   Procedure: EXTERNAL FIXATION LEG;  Surgeon: [name], MD;  Location: [place];  Service: Orthopedics;  Laterality: Right;  . EXTERNAL FIXATION REMOVAL Right 02/14/2015   Procedure: REMOVAL EXTERNAL FIXATION LEG;  Surgeon: [name], MD;  Location: [place];  Service: Orthopedics;  Laterality: Right;  . FEMUR IM NAIL Right 02/09/2015   Procedure: INTRAMEDULLARY (IM) RETROGRADE FEMORAL NAILING;  Surgeon: [name], MD;  Location: [place];  Service: Orthopedics;  Laterality: Right;  . NOSE SURGERY    . ORIF ANKLE FRACTURE Right 02/14/2015   Procedure: OPEN REDUCTION INTERNAL FIXATION (ORIF) PILON FRACTURE;  Surgeon: [name], MD;  Location: [place];  Service: Orthopedics;  Laterality: Right;  . ORIF ULNAR FRACTURE Right 02/09/2015   Procedure: OPEN REDUCTION INTERNAL FIXATION (ORIF) ULNAR  FRACTURE;  Surgeon: [name], MD;  Location: [place];  Service: Orthopedics;  Laterality: Right;  . POLYPECTOMY  07/25/2016   Procedure: POLYPECTOMY;  Surgeon: [name], MD;  [facility];  Service: Endoscopy;;  colon        Home Medications    Prior to Admission medications   Medication Sig Start Date End Date Taking? Authorizing Provider  gabapentin (NEURONTIN) 100 MG capsule Take 1 capsule (100 mg total) by mouth 2 (two) times daily. 04/28/17   Kirsteins, Luanna Salk, MD  HYDROcodone-acetaminophen (NORCO/VICODIN) 5-325 MG tablet Take 1 tablet by mouth every 8 (eight) hours as needed. 02/01/18   Couture, Cortni S, PA-C  insulin detemir (LEVEMIR) 100 UNIT/ML injection Inject 0.6 mLs (60 Units total) into the skin at bedtime. 05/28/16   Rexene Alberts, MD  lisinopril (PRINIVIL,ZESTRIL) 20 MG tablet Take 20 mg by mouth daily.     [provider]  pantoprazole (PROTONIX) 40 MG tablet Take 1 tablet (40 mg total) by mouth daily. Take 30 minutes before breakfast 03/25/17   Annitta Needs, NP  sitaGLIPtin-metformin (JANUMET) 50-1000 MG tablet Take 1 tablet by mouth 2 (two) times daily with a meal.    [provider]    Family History Family History  Problem Relation Age of Onset  . Diabetes Mother   . COPD Mother   . Diabetes Sister   . Colon cancer Neg Hx     Social History Social History   Tobacco Use  . Smoking status: Never Smoker  . Smokeless tobacco: Never Used  Substance Use Topics  . Alcohol use: No    Comment: history of alcohol abuse but quit in 2008   . Drug use: No     Allergies   Bee venom   Review of Systems Review of Systems  Respiratory: Negative for shortness of breath.   Cardiovascular: Negative for chest pain and leg swelling.  All other systems reviewed and are negative.    Physical Exam Updated Vital Signs BP (!) 158/105 (BP Location: Right Arm)   Pulse 82   Temp 98.2 F (36.8 C) (Oral)   Resp 18   Ht 1.803  m (5\' 11" )   Wt 108.9 kg   SpO2 98%   BMI 33.47 kg/m   Physical Exam  Constitutional: He appears well-developed and well-nourished. No distress.  HENT:  Head: Normocephalic and atraumatic.  Right Ear: External ear normal.  Left Ear: External ear normal.  Eyes: Conjunctivae are normal. Right eye exhibits no discharge. Left eye exhibits no discharge. No scleral icterus.  Neck: Neck supple. No tracheal deviation present.  Cardiovascular: Normal rate and regular rhythm.  Pulmonary/Chest: Effort normal and breath sounds normal. No stridor. No respiratory distress. He has no wheezes. He has no rales.  Abdominal: He exhibits no distension.  Musculoskeletal: He exhibits tenderness. He exhibits no edema.       Left wrist: He exhibits tenderness and bony tenderness. He exhibits no swelling  and no deformity.       Left hand: Normal.  Neurological: He is alert. Cranial nerve deficit: no gross deficits.  Skin: Skin is warm and dry. No rash noted.  Psychiatric: He has a normal mood and affect.  Nursing note and vitals reviewed.    ED Treatments / Results  Labs (all labs ordered are listed, but only abnormal results are displayed) Labs Reviewed - No data to display  EKG None  Radiology Dg Chest 2 View  Result Date: 04/27/2018 CLINICAL DATA:  Cough and shortness of breath for 2 weeks. History of previous trauma to the right chest in 2016. History of diabetes and hypertension. EXAM: CHEST - 2 VIEW COMPARISON:  05/15/2017 FINDINGS: Heart size and pulmonary vascularity are normal. Scattered fibrosis in the lungs with pleural thickening in the right minor fissure. No airspace disease or consolidation. No blunting of costophrenic angles. No pneumothorax. Mediastinal contours appear intact. Degenerative changes in the spine. Multiple old right rib fractures. Surgical clips in the right upper quadrant. No change since prior study. IMPRESSION: No evidence of active pulmonary disease. Chronic changes as  described. Electronically Signed   By: Lucienne Capers M.D.   On: 04/27/2018 21:06   Dg Wrist Complete Left  Result Date: 04/27/2018 CLINICAL DATA:  Posterior left wrist pain after injury yesterday. EXAM: LEFT WRIST - COMPLETE 3+ VIEW COMPARISON:  08/10/2014 FINDINGS: Degenerative changes in the radiocarpal and STT joints of the left wrist. Mild calcification in the triangular fibrocartilage. No evidence of acute fracture or dislocation. No focal bone lesion or bone destruction. Prominent vascular calcifications in the soft tissues. IMPRESSION: Degenerative changes in the left wrist. No acute bony abnormalities. Electronically Signed   By: Lucienne Capers M.D.   On: 04/27/2018 21:08    Procedures Procedures (including critical care time)  Medications Ordered in ED Medications - No data to display   Initial Impression / Assessment and Plan / ED Course  I have reviewed the triage vital signs and the nursing notes.  Pertinent labs & imaging results that were available during my care of the patient were reviewed by me and considered in my medical decision making (see chart for details).   Patient presented to the emergency room with complaints of wrist pain.  X-ray does not show any evidence of fracture or dislocation.  I have offered the patient as a Velcro splint.  He can take over-the-counter medications as needed.  Patient complained of a cough.  Chest x-ray does not show pneumonia.  Most likely a mild viral URI.  Final Clinical Impressions(s) / ED Diagnoses   Final diagnoses:  Sprain of left wrist, initial encounter    ED Discharge Orders    None       Dorie Rank, MD 04/27/18 2131

## 2018-04-27 NOTE — ED Triage Notes (Signed)
Pt states he was getting up from chair yesterday when he injured his L. Hand. States it has been broken before. Pt also c/o cough with congestion for 2 weeks.

## 2018-05-24 ENCOUNTER — Other Ambulatory Visit: Payer: Self-pay

## 2018-05-24 ENCOUNTER — Encounter (HOSPITAL_COMMUNITY): Payer: Self-pay | Admitting: Emergency Medicine

## 2018-05-24 ENCOUNTER — Emergency Department (HOSPITAL_COMMUNITY): Payer: Medicaid Other

## 2018-05-24 ENCOUNTER — Emergency Department (HOSPITAL_COMMUNITY)
Admission: EM | Admit: 2018-05-24 | Discharge: 2018-05-24 | Disposition: A | Discharge status: Home / Self Care | Payer: Medicaid Other | Attending: Emergency Medicine | Admitting: Emergency Medicine

## 2018-05-24 DIAGNOSIS — Z79899 Other long term (current) drug therapy: Secondary | ICD-10-CM | POA: Diagnosis not present

## 2018-05-24 DIAGNOSIS — I10 Essential (primary) hypertension: Secondary | ICD-10-CM | POA: Diagnosis not present

## 2018-05-24 DIAGNOSIS — Z794 Long term (current) use of insulin: Secondary | ICD-10-CM | POA: Diagnosis not present

## 2018-05-24 DIAGNOSIS — E119 Type 2 diabetes mellitus without complications: Secondary | ICD-10-CM | POA: Insufficient documentation

## 2018-05-24 DIAGNOSIS — M25532 Pain in left wrist: Secondary | ICD-10-CM | POA: Insufficient documentation

## 2018-05-24 MED ORDER — INDOMETHACIN 25 MG PO CAPS: 25.0000 mg | ORAL_CAPSULE | Freq: Three times a day (TID) | ORAL | 0 refills | Status: DC | PRN

## 2018-05-24 NOTE — Discharge Instructions (Addendum)
Try Indocin for pain.  See Dr. Aline Brochure for evalaution

## 2018-05-24 NOTE — ED Triage Notes (Signed)
Pt reports worsening pain and swelling to LT wrist and hand. States he was evaluated here last month for an injury while getting up out of a chair. No fractures. States symptoms have only worsened. Obvious edema to LT hand and wrist. States pain radiates from hand to elbow.

## 2018-05-24 NOTE — ED Provider Notes (Signed)
[facility] EMERGENCY DEPARTMENT Provider Note   CSN: [id number] Arrival date & time: 05/24/18  1006     History   Chief Complaint Chief Complaint  Patient presents with  . Wrist Problem    HPI Jeremy Burgess is a 52 y.o. male.  The history is provided by the patient. No language interpreter was used.  Wrist Pain  This is a recurrent problem. Episode onset: 1 month. The problem occurs constantly. The problem has been gradually worsening. Nothing aggravates the symptoms. Nothing relieves the symptoms.  Pt injured his wrist a month ago.  Pt reports pain has progressively worsened. Pt reports hand and wrist are swollen now.   Past Medical History:  Diagnosis Date  . Accidentally struck by falling tree 02/08/2015  . Acute blood loss anemia 02/09/2015  . Acute respiratory failure ([place]) 02/09/2015  . Arthritis   . Chronic pain due to trauma   . Diabetes mellitus   . Diabetes mellitus ([place])   . Heart murmur   . History of blood clots    DVT  . Hypertension   . Pneumothorax   . Postlaminectomy syndrome, lumbar region 11/13/2015  . Rib fracture   . Smoker 0   denies smoking  . Thrombocytopenia ([place]) 02/09/2015  . Traumatic closed displaced fracture of multiple ribs of right side 02/08/2015  . Traumatic hemopneumothorax 02/09/2015  . Upper GI bleeding 05/27/2016   Trivial. EGD unremarkable.    Patient Active Problem List   Diagnosis Date Noted  . Dyspepsia 03/25/2017  . Calculus of gallbladder without cholecystitis without obstruction   . Encounter for screening colonoscopy 07/01/2016  . RUQ pain 07/01/2016  . AKI (acute kidney injury) ([place]) 05/27/2016  . Upper GI bleeding 05/27/2016  . Upper GI bleed 05/26/2016  . Chronic pain due to trauma 12/11/2015  . Postlaminectomy syndrome, lumbar region 11/13/2015  . Anterior knee pain   . Pain in limb   . Closed right pilon fracture 02/23/2015  . Ankle fracture, right 02/09/2015  . Acute blood loss anemia 02/09/2015  .  Thrombocytopenia ([place]) 02/09/2015  . Acute renal failure ([place]) 02/09/2015  . Acute respiratory failure ([place]) 02/09/2015  . Traumatic hemopneumothorax 02/09/2015  . HTN (hypertension) 02/09/2015  . Accidentally struck by falling tree 02/08/2015  . Insulin dependent diabetes mellitus ([place]) 02/08/2015  . Traumatic closed displaced fracture of multiple ribs of right side 02/08/2015  . Closed fracture of right ulna 02/08/2015  . Femur fracture, right ([place]) 02/08/2015    Past Surgical History:  Procedure Laterality Date  . BACK SURGERY    . CHOLECYSTECTOMY N/A 10/30/2016   Procedure: LAPAROSCOPIC CHOLECYSTECTOMY;  Surgeon: [name], MD;  Location: [facility];  Service: General;  Laterality: N/A;  . COLONOSCOPY N/A 07/25/2016   Dr. [name]: 9 mm tubular adenoma. 5 year surveillance   . ESOPHAGOGASTRODUODENOSCOPY  2009   EGD completed by Dr. [name] due to hematemesis. Findings of normal esophagus, tiny antral erosions, normal D1, D2  . ESOPHAGOGASTRODUODENOSCOPY N/A 05/27/2016   Dr. [name]: normal   . EXTERNAL FIXATION LEG Right 02/09/2015   Procedure: EXTERNAL FIXATION LEG;  Surgeon: [name], MD;  Location: [place];  Service: Orthopedics;  Laterality: Right;  . EXTERNAL FIXATION REMOVAL Right 02/14/2015   Procedure: REMOVAL EXTERNAL FIXATION LEG;  Surgeon: [name], MD;  Location: [place];  Service: Orthopedics;  Laterality: Right;  . FEMUR IM NAIL Right 02/09/2015   Procedure: INTRAMEDULLARY (IM) RETROGRADE FEMORAL NAILING;  Surgeon: [name], MD;  Location:  [place] [facility];  Service: Orthopedics;  Laterality: Right;  . NOSE SURGERY    . ORIF ANKLE FRACTURE Right 02/14/2015   Procedure: OPEN REDUCTION INTERNAL FIXATION (ORIF) PILON FRACTURE;  Surgeon: [name], MD;  Location: [place];  Service: Orthopedics;  Laterality: Right;  . ORIF ULNAR FRACTURE Right 02/09/2015   Procedure: OPEN REDUCTION INTERNAL FIXATION (ORIF) ULNAR FRACTURE;  Surgeon: [name], MD;  Location: [place];   Service: Orthopedics;  Laterality: Right;  . POLYPECTOMY  07/25/2016   Procedure: POLYPECTOMY;  Surgeon: [name], MD;  [facility];  Service: Endoscopy;;  colon        Home Medications    Prior to Admission medications   Medication Sig Start Date End Date Taking? Authorizing Provider  insulin detemir (LEVEMIR) 100 UNIT/ML injection Inject 0.6 mLs (60 Units total) into the skin at bedtime. 05/28/16   Rexene Alberts, MD  JANUMET XR 50-1000 MG TB24 Take 1 tablet by mouth 2 (two) times daily. 04/29/18   [provider]  lisinopril (PRINIVIL,ZESTRIL) 20 MG tablet Take 20 mg by mouth daily.     [provider]  naproxen sodium (ALEVE) 220 MG tablet Take 220-660 mg by mouth daily as needed (for pain).    [provider]    Family History Family History  Problem Relation Age of Onset  . Diabetes Mother   . COPD Mother   . Diabetes Sister   . Colon cancer Neg Hx     Social History Social History   Tobacco Use  . Smoking status: Never Smoker  . Smokeless tobacco: Never Used  Substance Use Topics  . Alcohol use: No    Comment: history of alcohol abuse but quit in 2008   . Drug use: No     Allergies   Bee venom   Review of Systems Review of Systems  All other systems reviewed and are negative.    Physical Exam Updated Vital Signs BP (!) 168/92 (BP Location: Right Arm)   Pulse 81   Temp 97.8 F (36.6 C) (Oral)   Resp 16   Ht 5\' 11"  (1.803 m)   Wt 111.1 kg   SpO2 100%   BMI 34.17 kg/m   Physical Exam  Constitutional: He appears well-developed and well-nourished.  HENT:  Head: Normocephalic.  Musculoskeletal: He exhibits tenderness. He exhibits no deformity.  Swollen tender wrist and hand left nv and ns intact  Neurological: He is alert.  Skin: Skin is warm.  Psychiatric: He has a normal mood and affect.  Nursing note and vitals reviewed.    ED Treatments / Results  Labs (all labs ordered are listed, but only  abnormal results are displayed) Labs Reviewed - No data to display  EKG None  Radiology No results found.  Procedures Procedures (including critical care time)  Medications Ordered in ED Medications - No data to display   Initial Impression / Assessment and Plan / ED Course  I have reviewed the triage vital signs and the nursing notes.  Pertinent labs & imaging results that were available during my care of the patient were reviewed by me and considered in my medical decision making (see chart for details).    MDM  Pt counseled on xray,  No abnormality.  I advised pt to schedule to see Dr. Aline Brochure    Final Clinical Impressions(s) / ED Diagnoses   Final diagnoses:  Left wrist pain    ED Discharge Orders  Ordered    indomethacin (INDOCIN) 25 MG capsule  3 times daily PRN     05/24/18 1217        An After Visit Summary was printed and given to the patient.    Fransico Meadow, PA-C 05/24/18 1631    Nat Christen, MD 05/27/18 [phone]

## 2018-08-10 ENCOUNTER — Telehealth: Payer: Self-pay | Admitting: Orthopedic Surgery

## 2018-08-10 NOTE — Telephone Encounter (Signed)
Jeremy Burgess came by our office this morning stating he wanted to schedule an appointment with Dr Aline Brochure.  He said he has previously sustained a crush injury to his right hand and he wanted Dr. Aline Brochure to evaluate this.  He states he has not had any treatment for this hand.  He also said Dr. Lanice Shirts office was to refer him to this office.  I told him that we had not received a referral for him from Dr. Anastasio Champion.  I also told him to check with their office again regarding if and when this was sent.  I further told him that Dr. Aline Brochure would have to review his records to see if he needs to see a hand specialist or be seen here.    Jeremy Burgess will check on referral notes and once they are sent here  then we will ask Dr. Aline Brochure to review this information

## 2018-08-31 ENCOUNTER — Observation Stay (HOSPITAL_COMMUNITY)
Admission: EM | Admit: 2018-08-31 | Discharge: 2018-09-01 | Disposition: A | Discharge status: Home / Self Care | Payer: Medicaid Other | Attending: Internal Medicine | Admitting: Internal Medicine

## 2018-08-31 ENCOUNTER — Encounter (HOSPITAL_COMMUNITY): Payer: Self-pay

## 2018-08-31 ENCOUNTER — Emergency Department (HOSPITAL_COMMUNITY): Payer: Medicaid Other

## 2018-08-31 ENCOUNTER — Other Ambulatory Visit: Payer: Self-pay

## 2018-08-31 DIAGNOSIS — Z79899 Other long term (current) drug therapy: Secondary | ICD-10-CM | POA: Diagnosis not present

## 2018-08-31 DIAGNOSIS — G8921 Chronic pain due to trauma: Secondary | ICD-10-CM | POA: Insufficient documentation

## 2018-08-31 DIAGNOSIS — M79641 Pain in right hand: Secondary | ICD-10-CM | POA: Diagnosis present

## 2018-08-31 DIAGNOSIS — Z86718 Personal history of other venous thrombosis and embolism: Secondary | ICD-10-CM | POA: Diagnosis not present

## 2018-08-31 DIAGNOSIS — I4892 Unspecified atrial flutter: Principal | ICD-10-CM | POA: Insufficient documentation

## 2018-08-31 DIAGNOSIS — D696 Thrombocytopenia, unspecified: Secondary | ICD-10-CM | POA: Diagnosis not present

## 2018-08-31 DIAGNOSIS — M109 Gout, unspecified: Secondary | ICD-10-CM | POA: Insufficient documentation

## 2018-08-31 DIAGNOSIS — I480 Paroxysmal atrial fibrillation: Secondary | ICD-10-CM

## 2018-08-31 DIAGNOSIS — E119 Type 2 diabetes mellitus without complications: Secondary | ICD-10-CM | POA: Diagnosis not present

## 2018-08-31 DIAGNOSIS — Z791 Long term (current) use of non-steroidal anti-inflammatories (NSAID): Secondary | ICD-10-CM | POA: Diagnosis not present

## 2018-08-31 DIAGNOSIS — S66901S Unspecified injury of unspecified muscle, fascia and tendon at wrist and hand level, right hand, sequela: Secondary | ICD-10-CM

## 2018-08-31 DIAGNOSIS — I4891 Unspecified atrial fibrillation: Secondary | ICD-10-CM | POA: Diagnosis present

## 2018-08-31 DIAGNOSIS — Z7901 Long term (current) use of anticoagulants: Secondary | ICD-10-CM | POA: Diagnosis not present

## 2018-08-31 DIAGNOSIS — I10 Essential (primary) hypertension: Secondary | ICD-10-CM | POA: Insufficient documentation

## 2018-08-31 DIAGNOSIS — E1165 Type 2 diabetes mellitus with hyperglycemia: Secondary | ICD-10-CM

## 2018-08-31 DIAGNOSIS — Z794 Long term (current) use of insulin: Secondary | ICD-10-CM | POA: Insufficient documentation

## 2018-08-31 DIAGNOSIS — IMO0001 Reserved for inherently not codable concepts without codable children: Secondary | ICD-10-CM

## 2018-08-31 DIAGNOSIS — E1169 Type 2 diabetes mellitus with other specified complication: Secondary | ICD-10-CM

## 2018-08-31 DIAGNOSIS — R000 Tachycardia, unspecified: Secondary | ICD-10-CM | POA: Diagnosis present

## 2018-08-31 HISTORY — DX: Unspecified asthma, uncomplicated: J45.909

## 2018-08-31 LAB — CBC WITH DIFFERENTIAL/PLATELET
Abs Immature Granulocytes: 0.02 10*3/uL (ref 0.00–0.07)
Basophils Absolute: 0 10*3/uL (ref 0.0–0.1)
Basophils Relative: 1 %
EOS PCT: 1 %
Eosinophils Absolute: 0.1 10*3/uL (ref 0.0–0.5)
HCT: 40.8 % (ref 39.0–52.0)
Hemoglobin: 13 g/dL (ref 13.0–17.0)
Immature Granulocytes: 0 %
Lymphocytes Relative: 19 %
Lymphs Abs: 1.6 10*3/uL (ref 0.7–4.0)
MCH: 26.6 pg (ref 26.0–34.0)
MCHC: 31.9 g/dL (ref 30.0–36.0)
MCV: 83.4 fL (ref 80.0–100.0)
MONO ABS: 0.9 10*3/uL (ref 0.1–1.0)
Monocytes Relative: 10 %
Neutro Abs: 6.1 10*3/uL (ref 1.7–7.7)
Neutrophils Relative %: 69 %
PLATELETS: 217 10*3/uL (ref 150–400)
RBC: 4.89 MIL/uL (ref 4.22–5.81)
RDW: 13.7 % (ref 11.5–15.5)
WBC: 8.7 10*3/uL (ref 4.0–10.5)
nRBC: 0 % (ref 0.0–0.2)

## 2018-08-31 LAB — COMPREHENSIVE METABOLIC PANEL
ALK PHOS: 68 U/L (ref 38–126)
ALT: 17 U/L (ref 0–44)
ANION GAP: 10 (ref 5–15)
AST: 19 U/L (ref 15–41)
Albumin: 3.9 g/dL (ref 3.5–5.0)
BUN: 24 mg/dL — ABNORMAL HIGH (ref 6–20)
CHLORIDE: 104 mmol/L (ref 98–111)
CO2: 24 mmol/L (ref 22–32)
Calcium: 9.3 mg/dL (ref 8.9–10.3)
Creatinine, Ser: 1.22 mg/dL (ref 0.61–1.24)
GFR calc non Af Amer: >60 mL/min (ref >60)
Glucose, Bld: 274 mg/dL — ABNORMAL HIGH (ref 70–99)
Potassium: 3.9 mmol/L (ref 3.5–5.1)
SODIUM: 138 mmol/L (ref 135–145)
Total Bilirubin: 0.5 mg/dL (ref 0.3–1.2)
Total Protein: 7.5 g/dL (ref 6.5–8.1)

## 2018-08-31 LAB — TROPONIN I
Troponin I: <0.03 ng/mL (ref <0.03)
Troponin I: <0.03 ng/mL (ref <0.03)

## 2018-08-31 LAB — BRAIN NATRIURETIC PEPTIDE: B Natriuretic Peptide: 46 pg/mL (ref 0.0–100.0)

## 2018-08-31 LAB — URIC ACID: Uric Acid, Serum: 7.3 mg/dL (ref 3.7–8.6)

## 2018-08-31 LAB — CBG MONITORING, ED: Glucose-Capillary: 165 mg/dL — ABNORMAL HIGH (ref 70–99)

## 2018-08-31 MED ORDER — APIXABAN 2.5 MG PO TABS: 2.5000 mg | ORAL_TABLET | Freq: Two times a day (BID) | ORAL | Status: DC

## 2018-08-31 MED ORDER — METFORMIN HCL ER 500 MG PO TB24
1000.0000 mg | ORAL_TABLET | Freq: Two times a day (BID) | ORAL | Status: DC
  Administered 2018-08-31: 1000 mg via ORAL
  Filled 2018-08-31 (×5): qty 2

## 2018-08-31 MED ORDER — ACETAMINOPHEN 325 MG PO TABS: 650.0000 mg | ORAL_TABLET | ORAL | Status: DC | PRN

## 2018-08-31 MED ORDER — INSULIN DETEMIR 100 UNIT/ML ~~LOC~~ SOLN
60.0000 [IU] | Freq: Every day | SUBCUTANEOUS | Status: DC
  Administered 2018-08-31: 60 [IU] via SUBCUTANEOUS
  Filled 2018-08-31 (×2): qty 0.6

## 2018-08-31 MED ORDER — HYDROMORPHONE HCL 2 MG PO TABS: 2.0000 mg | ORAL_TABLET | Freq: Four times a day (QID) | ORAL | Status: DC | PRN

## 2018-08-31 MED ORDER — APIXABAN 5 MG PO TABS
5.0000 mg | ORAL_TABLET | Freq: Two times a day (BID) | ORAL | Status: DC
  Administered 2018-08-31 – 2018-09-01 (×2): 5 mg via ORAL
  Filled 2018-08-31 (×2): qty 1

## 2018-08-31 MED ORDER — DILTIAZEM HCL 25 MG/5ML IV SOLN
10.0000 mg | Freq: Once | INTRAVENOUS | Status: AC
  Administered 2018-08-31: 10 mg via INTRAVENOUS

## 2018-08-31 MED ORDER — DILTIAZEM HCL 100 MG IV SOLR
5.0000 mg/h | INTRAVENOUS | Status: DC
  Administered 2018-08-31 – 2018-09-01 (×2): 5 mg/h via INTRAVENOUS
  Filled 2018-08-31 (×2): qty 100

## 2018-08-31 MED ORDER — LISINOPRIL 10 MG PO TABS
20.0000 mg | ORAL_TABLET | Freq: Every day | ORAL | Status: DC
  Administered 2018-08-31: 20 mg via ORAL
  Filled 2018-08-31: qty 2

## 2018-08-31 MED ORDER — DILTIAZEM HCL 100 MG IV SOLR
INTRAVENOUS | Status: AC
  Filled 2018-08-31: qty 100

## 2018-08-31 MED ORDER — LINAGLIPTIN 5 MG PO TABS
5.0000 mg | ORAL_TABLET | Freq: Every day | ORAL | Status: DC
  Filled 2018-08-31 (×2): qty 1

## 2018-08-31 MED ORDER — SODIUM CHLORIDE 0.9 % IV SOLN: 250.0000 mL | INTRAVENOUS | Status: DC | PRN

## 2018-08-31 MED ORDER — INSULIN ASPART 100 UNIT/ML ~~LOC~~ SOLN
0.0000 [IU] | Freq: Three times a day (TID) | SUBCUTANEOUS | Status: DC
  Administered 2018-09-01: 2 [IU] via SUBCUTANEOUS

## 2018-08-31 MED ORDER — ONDANSETRON HCL 4 MG/2ML IJ SOLN: 4.0000 mg | Freq: Four times a day (QID) | INTRAMUSCULAR | Status: DC | PRN

## 2018-08-31 MED ORDER — SODIUM CHLORIDE 0.9% FLUSH: 3.0000 mL | INTRAVENOUS | Status: DC | PRN

## 2018-08-31 MED ORDER — DILTIAZEM HCL 100 MG IV SOLR: 5.0000 mg/h | INTRAVENOUS | Status: DC

## 2018-08-31 MED ORDER — SITAGLIP PHOS-METFORMIN HCL ER 50-1000 MG PO TB24: 1.0000 | ORAL_TABLET | Freq: Two times a day (BID) | ORAL | Status: DC

## 2018-08-31 MED ORDER — SODIUM CHLORIDE 0.9% FLUSH: 3.0000 mL | Freq: Two times a day (BID) | INTRAVENOUS | Status: DC

## 2018-08-31 MED ORDER — OXYCODONE-ACETAMINOPHEN 5-325 MG PO TABS
1.0000 | ORAL_TABLET | ORAL | Status: DC | PRN
  Administered 2018-08-31 – 2018-09-01 (×2): 1 via ORAL
  Filled 2018-08-31 (×2): qty 1

## 2018-08-31 NOTE — ED Provider Notes (Signed)
[facility] EMERGENCY DEPARTMENT Provider Note   CSN: [id number] Arrival date & time: 08/31/18  1445     History   Chief Complaint Chief Complaint  Patient presents with  . Hand Pain  . Foot Pain    HPI Jeremy Burgess is a 53 y.o. male.  Patient complains of pain in his right hand and right foot.  This is been hurting his foot for a few days and his hand for longer.  He hit his hand a month ago  The history is provided by the patient. No language interpreter was used.  Weakness  Severity:  Mild Onset quality:  Sudden Timing:  Constant Progression:  Waxing and waning Chronicity:  New Context: not alcohol use   Worsened by:  Nothing Associated symptoms: no abdominal pain, no chest pain, no cough, no diarrhea, no frequency, no headaches and no seizures   Risk factors: no anemia     Past Medical History:  Diagnosis Date  . Accidentally struck by falling tree 02/08/2015  . Acute blood loss anemia 02/09/2015  . Acute respiratory failure ([place]) 02/09/2015  . Arthritis   . Chronic pain due to trauma   . Diabetes mellitus   . Diabetes mellitus ([place])   . Heart murmur   . History of blood clots    DVT  . Hypertension   . Pneumothorax   . Postlaminectomy syndrome, lumbar region 11/13/2015  . Rib fracture   . Smoker 0   denies smoking  . Thrombocytopenia ([place]) 02/09/2015  . Traumatic closed displaced fracture of multiple ribs of right side 02/08/2015  . Traumatic hemopneumothorax 02/09/2015  . Upper GI bleeding 05/27/2016   Trivial. EGD unremarkable.    Patient Active Problem List   Diagnosis Date Noted  . Dyspepsia 03/25/2017  . Calculus of gallbladder without cholecystitis without obstruction   . Encounter for screening colonoscopy 07/01/2016  . RUQ pain 07/01/2016  . AKI (acute kidney injury) ([place]) 05/27/2016  . Upper GI bleeding 05/27/2016  . Upper GI bleed 05/26/2016  . Chronic pain due to trauma 12/11/2015  . Postlaminectomy syndrome, lumbar region 11/13/2015    . Anterior knee pain   . Pain in limb   . Closed right pilon fracture 02/23/2015  . Ankle fracture, right 02/09/2015  . Acute blood loss anemia 02/09/2015  . Thrombocytopenia ([place]) 02/09/2015  . Acute renal failure ([place]) 02/09/2015  . Acute respiratory failure ([place]) 02/09/2015  . Traumatic hemopneumothorax 02/09/2015  . HTN (hypertension) 02/09/2015  . Accidentally struck by falling tree 02/08/2015  . Insulin dependent diabetes mellitus ([place]) 02/08/2015  . Traumatic closed displaced fracture of multiple ribs of right side 02/08/2015  . Closed fracture of right ulna 02/08/2015  . Femur fracture, right ([place]) 02/08/2015    Past Surgical History:  Procedure Laterality Date  . BACK SURGERY    . CHOLECYSTECTOMY N/A 10/30/2016   Procedure: LAPAROSCOPIC CHOLECYSTECTOMY;  Surgeon: [name], MD;  Location: [facility];  Service: General;  Laterality: N/A;  . COLONOSCOPY N/A 07/25/2016   Dr. [name]: 9 mm tubular adenoma. 5 year surveillance   . ESOPHAGOGASTRODUODENOSCOPY  2009   EGD completed by Dr. [name] due to hematemesis. Findings of normal esophagus, tiny antral erosions, normal D1, D2  . ESOPHAGOGASTRODUODENOSCOPY N/A 05/27/2016   Dr. [name]: normal   . EXTERNAL FIXATION LEG Right 02/09/2015   Procedure: EXTERNAL FIXATION LEG;  Surgeon: [name], MD;  Location: [place];  Service: Orthopedics;  Laterality: Right;  . EXTERNAL FIXATION REMOVAL Right 02/14/2015  Procedure: REMOVAL EXTERNAL FIXATION LEG;  Surgeon: [name], MD;  Location: [place];  Service: Orthopedics;  Laterality: Right;  . FEMUR IM NAIL Right 02/09/2015   Procedure: INTRAMEDULLARY (IM) RETROGRADE FEMORAL NAILING;  Surgeon: [name], MD;  Location: [place];  Service: Orthopedics;  Laterality: Right;  . NOSE SURGERY    . ORIF ANKLE FRACTURE Right 02/14/2015   Procedure: OPEN REDUCTION INTERNAL FIXATION (ORIF) PILON FRACTURE;  Surgeon: [name], MD;  Location: [place];  Service: Orthopedics;  Laterality:  Right;  . ORIF ULNAR FRACTURE Right 02/09/2015   Procedure: OPEN REDUCTION INTERNAL FIXATION (ORIF) ULNAR FRACTURE;  Surgeon: [name], MD;  Location: [place];  Service: Orthopedics;  Laterality: Right;  . POLYPECTOMY  07/25/2016   Procedure: POLYPECTOMY;  Surgeon: [name], MD;  [facility];  Service: Endoscopy;;  colon        Home Medications    Prior to Admission medications   Medication Sig Start Date End Date Taking? Authorizing Provider  ibuprofen (ADVIL,MOTRIN) 200 MG tablet Take 600 mg by mouth every 6 (six) hours as needed.   Yes [provider]  insulin detemir (LEVEMIR) 100 UNIT/ML injection Inject 0.6 mLs (60 Units total) into the skin at bedtime. 05/28/16  Yes Rexene Alberts, MD  JANUMET XR 50-1000 MG TB24 Take 1 tablet by mouth 2 (two) times daily. 04/29/18  Yes [provider]  lisinopril (PRINIVIL,ZESTRIL) 20 MG tablet Take 20 mg by mouth daily.    Yes [provider]    Family History Family History  Problem Relation Age of Onset  . Diabetes Mother   . COPD Mother   . Diabetes Sister   . Colon cancer Neg Hx     Social History Social History   Tobacco Use  . Smoking status: Never Smoker  . Smokeless tobacco: Never Used  Substance Use Topics  . Alcohol use: No    Comment: history of alcohol abuse but quit in 2008   . Drug use: No     Allergies   Bee venom   Review of Systems Review of Systems  Constitutional: Negative for appetite change and fatigue.  HENT: Negative for congestion, ear discharge and sinus pressure.   Eyes: Negative for discharge.  Respiratory: Negative for cough.   Cardiovascular: Negative for chest pain.  Gastrointestinal: Negative for abdominal pain and diarrhea.  Genitourinary: Negative for frequency and hematuria.  Musculoskeletal: Negative for back pain.       Pain and swelling right foot and hand  Skin: Negative for rash.  Neurological: Positive for weakness. Negative for  seizures and headaches.  Psychiatric/Behavioral: Negative for hallucinations.     Physical Exam Updated Vital Signs BP (!) 141/81   Pulse 83   Temp 97.9 F (36.6 C) (Oral)   Resp 12   Ht 5\' 11"  (1.803 m)   Wt 111.1 kg   SpO2 99%   BMI 34.17 kg/m   Physical Exam Vitals signs and nursing note reviewed.  Constitutional:      Appearance: He is well-developed.  HENT:     Head: Normocephalic.     Nose: Nose normal.  Eyes:     General: No scleral icterus.    Conjunctiva/sclera: Conjunctivae normal.  Neck:     Musculoskeletal: Neck supple.     Thyroid: No thyromegaly.  Cardiovascular:     Heart sounds: No murmur. No friction rub. No gallop.      Comments: Rapid irregular heartbeat Pulmonary:  Breath sounds: No stridor. No wheezing or rales.  Chest:     Chest wall: No tenderness.  Abdominal:     General: There is no distension.     Tenderness: There is no abdominal tenderness. There is no rebound.  Musculoskeletal:     Comments: Tenderness and swelling to right large toe and swelling tenderness to right hand  Lymphadenopathy:     Cervical: No cervical adenopathy.  Skin:    Findings: No erythema or rash.  Neurological:     Mental Status: He is oriented to person, place, and time.     Motor: No abnormal muscle tone.     Coordination: Coordination normal.  Psychiatric:        Behavior: Behavior normal.      ED Treatments / Results  Labs (all labs ordered are listed, but only abnormal results are displayed) Labs Reviewed  COMPREHENSIVE METABOLIC PANEL - Abnormal; Notable for the following components:      Result Value   Glucose, Bld 274 (*)    BUN 24 (*)    All other components within normal limits  CBC WITH DIFFERENTIAL/PLATELET  TROPONIN I  BRAIN NATRIURETIC PEPTIDE  URIC ACID    EKG EKG Interpretation  Date/Time:  Monday August 31 2018 15:14:47 EST Ventricular Rate:  104 PR Interval:    QRS Duration: 147 QT Interval:  381 QTC  Calculation: 502 R Axis:   27 Text Interpretation:  Atrial fibrillation Ventricular premature complex Nonspecific intraventricular conduction delay Borderline repolarization abnormality Confirmed by Milton Ferguson [phone]) on 08/31/2018 4:05:22 PM   Radiology Dg Chest 2 View  Result Date: 08/31/2018 CLINICAL DATA:  Tachycardia EXAM: CHEST - 2 VIEW COMPARISON:  04/27/2018 FINDINGS: Cardiac shadow is within normal limits. The lungs are well aerated bilaterally. No focal infiltrate or sizable effusion is seen. Stable pleural thickening is noted on the right. Old rib fractures are also noted on the right. No acute bony abnormality is noted. IMPRESSION: No acute abnormality noted. Electronically Signed   By: Inez Catalina M.D.   On: 08/31/2018 16:23   Dg Hand Complete Right  Result Date: 08/31/2018 CLINICAL DATA:  Blunt trauma 1 month ago with persistent hand pain, initial encounter EXAM: RIGHT HAND - COMPLETE 3+ VIEW COMPARISON:  None. FINDINGS: Degenerative changes of the third MCP joint are noted. Similar changes are noted at the third PIP joint no acute fracture or dislocation is seen. Mild soft tissue swelling is noted as well. IMPRESSION: Degenerative changes as described. Electronically Signed   By: Inez Catalina M.D.   On: 08/31/2018 16:24   Dg Foot Complete Right  Result Date: 08/31/2018 CLINICAL DATA:  Right foot pain for several days, no known injury, initial encounter EXAM: RIGHT FOOT COMPLETE - 3+ VIEW COMPARISON:  None. FINDINGS: Postsurgical changes in the distal tibia and fibula are noted. Degenerative change at the first MTP joint with mild hallux valgus deformity is seen. No acute fracture or dislocation is noted. Tarsal degenerative changes and calcaneal spurring is noted. Diffuse vascular calcifications are seen. IMPRESSION: Multifocal degenerative and postoperative changes without acute abnormality. Electronically Signed   By: Inez Catalina M.D.   On: 08/31/2018 16:31     Procedures Procedures (including critical care time)  Medications Ordered in ED Medications  diltiazem (CARDIZEM) 100 mg in dextrose 5 % 100 mL (1 mg/mL) infusion (5 mg/hr Intravenous Rate/Dose Verify 08/31/18 1612)  diltiazem (CARDIZEM) injection 10 mg (10 mg Intravenous Given 08/31/18 1539)     Initial Impression / Assessment  and Plan / ED Course  I have reviewed the triage vital signs and the nursing notes.  Pertinent labs & imaging results that were available during my care of the patient were reviewed by me and considered in my medical decision making (see chart for details).     CRITICAL CARE Performed by: Milton Ferguson Total critical care time:45 minutes Critical care time was exclusive of separately billable procedures and treating other patients. Critical care was necessary to treat or prevent imminent or life-threatening deterioration. Critical care was time spent personally by me on the following activities: development of treatment plan with patient and/or surrogate as well as nursing, discussions with consultants, evaluation of patient's response to treatment, examination of patient, obtaining history from patient or surrogate, ordering and performing treatments and interventions, ordering and review of laboratory studies, ordering and review of radiographic studies, pulse oximetry and re-evaluation of patient's condition. Patient with rapid atrial fib.  He is responding to the Cardizem drip.  He also has discomfort in his right foot and hand which could possibly be gout Final Clinical Impressions(s) / ED Diagnoses   Final diagnoses:  Atrial fibrillation with RVR [facility])    ED Discharge Orders    None       Milton Ferguson, MD 08/31/18 1732

## 2018-08-31 NOTE — H&P (Addendum)
History and Physical    Jeremy Burgess SWF:[id number] DOB: 1965-10-03 DOA: 08/31/2018  PCP: Jeremy Albee, MD  Patient coming from: Home  I have personally briefly reviewed patient's old medical records in [place]  Chief Complaint: Pain   HPI: Jeremy Burgess is a 53 y.o. male with medical history significant of DM who came to ER with c/o pain  History of present illness dates back to years ago when pt had crushing injury to his right hand, but from  past few weeks patient had a increasing pain in his right hand.  Patient has since been seen by his primary care and referral has been made to an orthopedic surgeon. Patient came in to the ER because he was having unbearable excruciating pain. Pt also complained of heart racing Pt gave a history that he was told 20 years ago that he has got fast heart rate and whenever he is in pain his heart starts beating fast. Patient offers no complaint of chest pain. Patient offers no complaint of shortness of breath. Patient offers no complaint of fever cough or chills. He offers no complaint of nausea vomiting diarrhea Pt offers no complaint of any focal weakness. Pt offers no complaint of abdominal pain.  ED Course: Patient was found to be in atrial fibrillation ,patient was started on Cardizem drip  Review of Systems: As per HPI otherwise 10 point review of systems negative.    Past Medical History:  Diagnosis Date  . Accidentally struck by falling tree 02/08/2015  . Acute blood loss anemia 02/09/2015  . Acute respiratory failure ([place]) 02/09/2015  . Arthritis   . Chronic pain due to trauma   . Diabetes mellitus   . Diabetes mellitus ([place])   . Heart murmur   . History of blood clots    DVT  . Hypertension   . Pneumothorax   . Postlaminectomy syndrome, lumbar region 11/13/2015  . Rib fracture   . Smoker 0   denies smoking  . Thrombocytopenia ([place]) 02/09/2015  . Traumatic closed displaced fracture of multiple ribs of right  side 02/08/2015  . Traumatic hemopneumothorax 02/09/2015  . Upper GI bleeding 05/27/2016   Trivial. EGD unremarkable.    Past Surgical History:  Procedure Laterality Date  . BACK SURGERY    . CHOLECYSTECTOMY N/A 10/30/2016   Procedure: LAPAROSCOPIC CHOLECYSTECTOMY;  Surgeon: [name], MD;  Location: [facility];  Service: General;  Laterality: N/A;  . COLONOSCOPY N/A 07/25/2016   Dr. [name]: 9 mm tubular adenoma. 5 year surveillance   . ESOPHAGOGASTRODUODENOSCOPY  2009   EGD completed by Dr. [name] due to hematemesis. Findings of normal esophagus, tiny antral erosions, normal D1, D2  . ESOPHAGOGASTRODUODENOSCOPY N/A 05/27/2016   Dr. [name]: normal   . EXTERNAL FIXATION LEG Right 02/09/2015   Procedure: EXTERNAL FIXATION LEG;  Surgeon: [name], MD;  Location: [place];  Service: Orthopedics;  Laterality: Right;  . EXTERNAL FIXATION REMOVAL Right 02/14/2015   Procedure: REMOVAL EXTERNAL FIXATION LEG;  Surgeon: [name], MD;  Location: [place];  Service: Orthopedics;  Laterality: Right;  . FEMUR IM NAIL Right 02/09/2015   Procedure: INTRAMEDULLARY (IM) RETROGRADE FEMORAL NAILING;  Surgeon: [name], MD;  Location: [place];  Service: Orthopedics;  Laterality: Right;  . NOSE SURGERY    . ORIF ANKLE FRACTURE Right 02/14/2015   Procedure: OPEN REDUCTION INTERNAL FIXATION (ORIF) PILON FRACTURE;  Surgeon: [name], MD;  Location: [place];  Service: Orthopedics;  Laterality:  Right;  [name] ORIF ULNAR FRACTURE Right 02/09/2015   Procedure: OPEN REDUCTION INTERNAL FIXATION (ORIF) ULNAR FRACTURE;  Surgeon: [name], MD;  Location: [place];  Service: Orthopedics;  Laterality: Right;  . POLYPECTOMY  07/25/2016   Procedure: POLYPECTOMY;  Surgeon: [name], MD;  [facility];  Service: Endoscopy;;  colon    Social History:  reports that he has never smoked. He has never used smokeless tobacco. He reports that he does not drink alcohol or use drugs.  Allergies  Allergen  Reactions  . Bee Venom Anaphylaxis    Intolerance HONEY BEES    Family History  Problem Relation Age of Onset  . Diabetes Mother   . COPD Mother   . Diabetes Sister   . Colon cancer Neg Hx    Family history: Family history reviewed and not pertinent  Prior to Admission medications   Medication Sig Start Date End Date Taking? Authorizing Provider  ibuprofen (ADVIL,MOTRIN) 200 MG tablet Take 600 mg by mouth every 6 (six) hours as needed.   Yes [provider]  insulin detemir (LEVEMIR) 100 UNIT/ML injection Inject 0.6 mLs (60 Units total) into the skin at bedtime. 05/28/16  Yes Rexene Alberts, MD  JANUMET XR 50-1000 MG TB24 Take 1 tablet by mouth 2 (two) times daily. 04/29/18  Yes [provider]  lisinopril (PRINIVIL,ZESTRIL) 20 MG tablet Take 20 mg by mouth daily.    Yes [provider]    Physical Exam: Vitals:   08/31/18 1515 08/31/18 1556 08/31/18 1600 08/31/18 1700  BP: (!) 160/69  (!) 141/84 (!) 141/81  Pulse: 69 82 87 83  Resp: 14 17 16 12   Temp:      TempSrc:      SpO2: 99% 98% 99% 99%  Weight:      Height:        Constitutional: NAD, calm, comfortable Eyes: PERRL, lids and conjunctivae normal ENMT: Mucous membranes are moist. Posterior pharynx clear of any exudate or lesions.Normal dentition.  Neck: normal, supple, no masses, no thyromegaly Respiratory: clear to auscultation bilaterally, no wheezing, no crackles. Normal respiratory effort. No accessory muscle use.  Cardiovascular: IRRegular rate and rhythm, no murmurs / rubs / gallops. No extremity edema. 2+ pedal pulses. No carotid bruits.  Abdomen: no tenderness, no masses palpated. No hepatosplenomegaly. Bowel sounds positive.  Musculoskeletal: no clubbing / cyanosis. Right hand middle finger and third finger unable to be extended since crushing injury  Poor ROM in right hand and right foot,  Contractures present in right hand. Normal muscle tone. Right foot edema( local healed surgical  scar) Skin: no rashes, lesions, ulcers. No induration Neurologic: CN 2-12 grossly intact. Sensation intact, DTR normal. Strength 5/5 in all 4.  Psychiatric: Normal judgment and insight. Alert and oriented x 3. Normal mood.    Labs on Admission: I have personally reviewed following labs and imaging studies  CBC: Recent Labs  Lab 08/31/18 1546  WBC 8.7  NEUTROABS 6.1  HGB 13.0  HCT 40.8  MCV 83.4  PLT 709   Basic Metabolic Panel: Recent Labs  Lab 08/31/18 1546  NA 138  K 3.9  CL 104  CO2 24  GLUCOSE 274*  BUN 24*  CREATININE 1.22  CALCIUM 9.3   GFR: Estimated Creatinine Clearance: 86.7 mL/min (by C-G formula based on SCr of 1.22 mg/dL). Liver Function Tests: Recent Labs  Lab 08/31/18 1546  AST 19  ALT 17  ALKPHOS 68  BILITOT 0.5  PROT 7.5  ALBUMIN 3.9   No results for input(s): LIPASE, AMYLASE in the last 168 hours. No results for input(s): AMMONIA in the last 168 hours. Coagulation Profile: No results for input(s): INR, PROTIME in the last 168 hours. Cardiac Enzymes: Recent Labs  Lab 08/31/18 1546  TROPONINI <0.03    Urine analysis:    Component Value Date/Time   COLORURINE YELLOW 05/26/2016 [place] 05/26/2016 1727   LABSPEC 1.020 05/26/2016 1727   PHURINE 5.5 05/26/2016 1727   GLUCOSEU 100 (A) 05/26/2016 1727   HGBUR NEGATIVE 05/26/2016 1727   BILIRUBINUR NEGATIVE 05/26/2016 1727   KETONESUR NEGATIVE 05/26/2016 1727   PROTEINUR NEGATIVE 05/26/2016 1727   UROBILINOGEN 1.0 02/12/2015 1206   NITRITE NEGATIVE 05/26/2016 1727   LEUKOCYTESUR NEGATIVE 05/26/2016 1727    Radiological Exams on Admission: Dg Chest 2 View  Result Date: 08/31/2018 CLINICAL DATA:  Tachycardia EXAM: CHEST - 2 VIEW COMPARISON:  04/27/2018 FINDINGS: Cardiac shadow is within normal limits. The lungs are well aerated bilaterally. No focal infiltrate or sizable effusion is seen. Stable pleural thickening is noted on the right. Old rib fractures are also noted  on the right. No acute bony abnormality is noted. IMPRESSION: No acute abnormality noted. Electronically Signed   By: Inez Catalina M.D.   On: 08/31/2018 16:23   Dg Hand Complete Right  Result Date: 08/31/2018 CLINICAL DATA:  Blunt trauma 1 month ago with persistent hand pain, initial encounter EXAM: RIGHT HAND - COMPLETE 3+ VIEW COMPARISON:  None. FINDINGS: Degenerative changes of the third MCP joint are noted. Similar changes are noted at the third PIP joint no acute fracture or dislocation is seen. Mild soft tissue swelling is noted as well. IMPRESSION: Degenerative changes as described. Electronically Signed   By: Inez Catalina M.D.   On: 08/31/2018 16:24   Dg Foot Complete Right  Result Date: 08/31/2018 CLINICAL DATA:  Right foot pain for several days, no known injury, initial encounter EXAM: RIGHT FOOT COMPLETE - 3+ VIEW COMPARISON:  None. FINDINGS: Postsurgical changes in the distal tibia and fibula are noted. Degenerative change at the first MTP joint with mild hallux valgus deformity is seen. No acute fracture or dislocation is noted. Tarsal degenerative changes and calcaneal spurring is noted. Diffuse vascular calcifications are seen. IMPRESSION: Multifocal degenerative and postoperative changes without acute abnormality. Electronically Signed   By: Inez Catalina M.D.   On: 08/31/2018 16:31    EKG: Independently reviewed.   Assessment/Plan Active Problems:  Atrial fibrillation with RVR (HCC)     Pt has [place] score of 2.       Hx of HTN and DM= score of 2       Will admit to Step down       Will cycle trops       Will start pt on Eliquis       Will ask for Pharmacy consult .       Will ask for TSH and 2d echo in the am       Pt may need inpt Cardiology eval .       Pt is currently on IV Diltiazem       Will follow Diltiazem protocol to switch to PO      Insulin dependent diabetes mellitus ([place])     Will continue pt on home regimen      Will keep on sliding scale      Will ask  for HGbA1c    HTN (hypertension)  Will continue pt on ACE      Pt is now also on diltiazem      Pt is currently on IV drip          Chronic pain due to trauma     Will start pt on Dilaudid and keep on NSAIDs    DVT prophylaxis: Pt on Eliquis  Code Status: Full code  Family Communication: will contact in am  Disposition Plan:  Home  Consults called:  Cardiology consult placed .Pt may also need outpt hand sx consult ( pt says he has been having difficulty finding sec to his insurance issues)  Admission status:  Observation   Liana Gerold MD [facility]   If 7PM-7AM, please contact night-coverage [email or web address]   08/31/2018, 5:56 PM

## 2018-08-31 NOTE — ED Notes (Signed)
[facility] at this time.

## 2018-08-31 NOTE — ED Notes (Signed)
Upon putting pt on 12 lead monitoring pt's HR 140, EKG obtained. Pt denies hx of A-fib. MD Zammit given EKG.

## 2018-08-31 NOTE — ED Notes (Signed)
Pt's HR down to the 80's but still remains in A-fib. Now pt states he was told 15 years ago he had A-fib, but has never been on anticoagulants or followed by cardiology.

## 2018-08-31 NOTE — ED Triage Notes (Signed)
Pt initially in to be seen for right hand pain since 07/18/18. Pt reports hitting middle finger on end of table. Noted to have swelling to middle knuckle. Also reports right foot, great toe and ankle swelling and pain for 3 days. Pt states his heart feels like it is racing at times. HR 160 when triage started then decreased to 78

## 2018-09-01 ENCOUNTER — Observation Stay (HOSPITAL_BASED_OUTPATIENT_CLINIC_OR_DEPARTMENT_OTHER): Payer: Medicaid Other

## 2018-09-01 ENCOUNTER — Encounter (HOSPITAL_COMMUNITY): Payer: Self-pay | Admitting: *Deleted

## 2018-09-01 DIAGNOSIS — E1165 Type 2 diabetes mellitus with hyperglycemia: Secondary | ICD-10-CM

## 2018-09-01 DIAGNOSIS — I4892 Unspecified atrial flutter: Secondary | ICD-10-CM

## 2018-09-01 DIAGNOSIS — S66901S Unspecified injury of unspecified muscle, fascia and tendon at wrist and hand level, right hand, sequela: Secondary | ICD-10-CM

## 2018-09-01 DIAGNOSIS — G8921 Chronic pain due to trauma: Secondary | ICD-10-CM

## 2018-09-01 DIAGNOSIS — E1169 Type 2 diabetes mellitus with other specified complication: Secondary | ICD-10-CM

## 2018-09-01 DIAGNOSIS — I10 Essential (primary) hypertension: Secondary | ICD-10-CM

## 2018-09-01 DIAGNOSIS — I4891 Unspecified atrial fibrillation: Secondary | ICD-10-CM

## 2018-09-01 HISTORY — DX: Unspecified injury of unspecified muscle, fascia and tendon at wrist and hand level, right hand, sequela: S66.901S

## 2018-09-01 LAB — PROTIME-INR
INR: 1.03
Prothrombin Time: 13.4 seconds (ref 11.4–15.2)

## 2018-09-01 LAB — CBC
HCT: 42.4 % (ref 39.0–52.0)
Hemoglobin: 13.1 g/dL (ref 13.0–17.0)
MCH: 26.4 pg (ref 26.0–34.0)
MCHC: 30.9 g/dL (ref 30.0–36.0)
MCV: 85.5 fL (ref 80.0–100.0)
Platelets: 244 10*3/uL (ref 150–400)
RBC: 4.96 MIL/uL (ref 4.22–5.81)
RDW: 13.9 % (ref 11.5–15.5)
WBC: 8.4 10*3/uL (ref 4.0–10.5)
nRBC: 0 % (ref 0.0–0.2)

## 2018-09-01 LAB — HEMOGLOBIN A1C
Hgb A1c MFr Bld: 7.5 % — ABNORMAL HIGH (ref 4.8–5.6)
Mean Plasma Glucose: 168.55 mg/dL

## 2018-09-01 LAB — T4, FREE: Free T4: 0.85 ng/dL (ref 0.82–1.77)

## 2018-09-01 LAB — BASIC METABOLIC PANEL
Anion gap: 11 (ref 5–15)
BUN: 25 mg/dL — ABNORMAL HIGH (ref 6–20)
CO2: 25 mmol/L (ref 22–32)
Calcium: 9.2 mg/dL (ref 8.9–10.3)
Chloride: 103 mmol/L (ref 98–111)
Creatinine, Ser: 1.27 mg/dL — ABNORMAL HIGH (ref 0.61–1.24)
GFR calc Af Amer: >60 mL/min (ref >60)
GFR calc non Af Amer: >60 mL/min (ref >60)
Glucose, Bld: 111 mg/dL — ABNORMAL HIGH (ref 70–99)
Potassium: 4.3 mmol/L (ref 3.5–5.1)
Sodium: 139 mmol/L (ref 135–145)

## 2018-09-01 LAB — ECHOCARDIOGRAM COMPLETE
Height: 71 in
Weight: 3869.51 oz

## 2018-09-01 LAB — LIPID PANEL
Cholesterol: 120 mg/dL (ref 0–200)
HDL: 35 mg/dL — ABNORMAL LOW (ref >40)
LDL Cholesterol: 73 mg/dL (ref 0–99)
Total CHOL/HDL Ratio: 3.4 RATIO
Triglycerides: 58 mg/dL (ref <150)
VLDL: 12 mg/dL (ref 0–40)

## 2018-09-01 LAB — TSH: TSH: 4.939 u[IU]/mL — ABNORMAL HIGH (ref 0.350–4.500)

## 2018-09-01 LAB — GLUCOSE, CAPILLARY: Glucose-Capillary: 142 mg/dL — ABNORMAL HIGH (ref 70–99)

## 2018-09-01 LAB — MRSA PCR SCREENING: MRSA by PCR: NEGATIVE

## 2018-09-01 LAB — TROPONIN I
Troponin I: <0.03 ng/mL (ref <0.03)
Troponin I: <0.03 ng/mL (ref <0.03)

## 2018-09-01 LAB — MAGNESIUM: Magnesium: 1.6 mg/dL — ABNORMAL LOW (ref 1.7–2.4)

## 2018-09-01 MED ORDER — DILTIAZEM HCL ER COATED BEADS 120 MG PO CP24
120.0000 mg | ORAL_CAPSULE | Freq: Every day | ORAL | Status: DC
  Administered 2018-09-01: 120 mg via ORAL
  Filled 2018-09-01: qty 1

## 2018-09-01 MED ORDER — PREDNISONE 50 MG PO TABS: 50.0000 mg | ORAL_TABLET | Freq: Every day | ORAL | 0 refills | Status: DC

## 2018-09-01 MED ORDER — APIXABAN 5 MG PO TABS: 5.0000 mg | ORAL_TABLET | Freq: Two times a day (BID) | ORAL | 1 refills | Status: DC

## 2018-09-01 MED ORDER — DILTIAZEM HCL 30 MG PO TABS
30.0000 mg | ORAL_TABLET | Freq: Four times a day (QID) | ORAL | Status: DC
  Administered 2018-09-01 (×2): 30 mg via ORAL
  Filled 2018-09-01 (×2): qty 1

## 2018-09-01 MED ORDER — DILTIAZEM HCL ER COATED BEADS 120 MG PO CP24: 120.0000 mg | ORAL_CAPSULE | Freq: Every day | ORAL | 1 refills | Status: DC

## 2018-09-01 MED ORDER — MAGNESIUM SULFATE 2 GM/50ML IV SOLN
2.0000 g | Freq: Once | INTRAVENOUS | Status: AC
  Administered 2018-09-01: 2 g via INTRAVENOUS
  Filled 2018-09-01: qty 50

## 2018-09-01 NOTE — Progress Notes (Signed)
*  PRELIMINARY RESULTS* Echocardiogram 2D Echocardiogram has been performed.  Jeremy Burgess 09/01/2018, 11:53 AM

## 2018-09-01 NOTE — Consult Note (Addendum)
Cardiology Consult    Patient ID: Jeremy Burgess; [id number]; 05-20-1966   Admit date: 08/31/2018 Date of Consult: 09/01/2018  Primary Care Provider: Doree Albee, MD Primary Cardiologist: New to [facility] - Dr. Harl Bowie  Patient Profile    Jeremy Burgess is a 53 y.o. male with past medical history of HTN, Type 2 DM, and history of provoked DVT (occurring in 02/2015) who is being seen today for the evaluation of atrial fibrillation with RVR at the request of Dr. Carles Collet.   History of Present Illness    Jeremy Burgess presented to [facility] ED on 08/31/2018 for evaluation of right hand pain occurring since the time of initial injury on 07/18/2018. While being evaluated in Triage, he reported a history of intermittent palpitations and initial EKG showed what appears most consistent with atrial flutter, HR 104. Labs showed WBC 8.7, Hgb 13.0, platelets 217, Na+ 138, K+ 3.9, and creatinine 1.22. Magnesium 1.6. BNP 46. TSH 4.939 with Free T4 pending. Initial and cyclic troponin values have been negative. CXR showed no acute cardiopulmonary abnormalities.   He was started on IV Cardizem while in the ED with this being weaned this morning (currently on 5mg /hr) and transitioned to Cardizem 30mg  Q6H. He has also been started on Eliquis 5mg  BID for anticoagulation.   In talking with the patient today, he reports being told he has an irregular heart rate for over 20+ years. Says that his brother has undergone multiple procedures to get his heart back into a regular rhythm but was unsure of the specific diagnosis. Yesterday, he came to the ED for further evaluation of right hand pain and says that he was unaware of his tachycardia. Does experience occasional palpitations but reports these are usually self-limiting. No recent chest pain or dyspnea on exertion. No recent orthopnea, PND, or lower extremity edema.  By review of telemetry, he has remained in rate-controlled atrial flutter with variable conduction. Heart  rates have been in the 70's to 80's and he is asymptomatic at this time.  Past Medical History:  Diagnosis Date  . Accidentally struck by falling tree 02/08/2015  . Acute blood loss anemia 02/09/2015  . Acute respiratory failure ([place]) 02/09/2015  . Arthritis   . Asthma   . Chronic pain due to trauma   . Diabetes mellitus   . Diabetes mellitus ([place])   . Heart murmur   . History of blood clots    DVT  . Hypertension   . Pneumothorax   . Postlaminectomy syndrome, lumbar region 11/13/2015  . Rib fracture   . Thrombocytopenia ([place]) 02/09/2015  . Traumatic closed displaced fracture of multiple ribs of right side 02/08/2015  . Traumatic hemopneumothorax 02/09/2015  . Upper GI bleeding 05/27/2016   Trivial. EGD unremarkable.    Past Surgical History:  Procedure Laterality Date  . BACK SURGERY    . CHOLECYSTECTOMY N/A 10/30/2016   Procedure: LAPAROSCOPIC CHOLECYSTECTOMY;  Surgeon: [name], MD;  Location: [facility];  Service: General;  Laterality: N/A;  . COLONOSCOPY N/A 07/25/2016   Dr. [name]: 9 mm tubular adenoma. 5 year surveillance   . ESOPHAGOGASTRODUODENOSCOPY  2009   EGD completed by Dr. [name] due to hematemesis. Findings of normal esophagus, tiny antral erosions, normal D1, D2  . ESOPHAGOGASTRODUODENOSCOPY N/A 05/27/2016   Dr. [name]: normal   . EXTERNAL FIXATION LEG Right 02/09/2015   Procedure: EXTERNAL FIXATION LEG;  Surgeon: [name], MD;  Location: [place];  Service: Orthopedics;  Laterality: Right;  .  EXTERNAL FIXATION REMOVAL Right 02/14/2015   Procedure: REMOVAL EXTERNAL FIXATION LEG;  Surgeon: [name], MD;  Location: [place];  Service: Orthopedics;  Laterality: Right;  . FEMUR IM NAIL Right 02/09/2015   Procedure: INTRAMEDULLARY (IM) RETROGRADE FEMORAL NAILING;  Surgeon: [name], MD;  Location: [place];  Service: Orthopedics;  Laterality: Right;  . NOSE SURGERY    . ORIF ANKLE FRACTURE Right 02/14/2015   Procedure: OPEN REDUCTION INTERNAL FIXATION (ORIF)  PILON FRACTURE;  Surgeon: [name], MD;  Location: [place];  Service: Orthopedics;  Laterality: Right;  . ORIF ULNAR FRACTURE Right 02/09/2015   Procedure: OPEN REDUCTION INTERNAL FIXATION (ORIF) ULNAR FRACTURE;  Surgeon: [name], MD;  Location: [place];  Service: Orthopedics;  Laterality: Right;  . POLYPECTOMY  07/25/2016   Procedure: POLYPECTOMY;  Surgeon: [name], MD;  [facility];  Service: Endoscopy;;  colon     Home Medications:  Prior to Admission medications   Medication Sig Start Date End Date Taking? Authorizing Provider  ibuprofen (ADVIL,MOTRIN) 200 MG tablet Take 600 mg by mouth every 6 (six) hours as needed.   Yes [provider]  insulin detemir (LEVEMIR) 100 UNIT/ML injection Inject 0.6 mLs (60 Units total) into the skin at bedtime. 05/28/16  Yes Rexene Alberts, MD  JANUMET XR 50-1000 MG TB24 Take 1 tablet by mouth 2 (two) times daily. 04/29/18  Yes [provider]  lisinopril (PRINIVIL,ZESTRIL) 20 MG tablet Take 20 mg by mouth daily.    Yes [provider]    Inpatient Medications: Scheduled Meds: . apixaban  5 mg Oral BID  . diltiazem  30 mg Oral Q6H  . insulin aspart  0-15 Units Subcutaneous TID WC  . insulin detemir  60 Units Subcutaneous QHS  . sodium chloride flush  3 mL Intravenous Q12H   Continuous Infusions: . sodium chloride     PRN Meds: sodium chloride, acetaminophen, ondansetron (ZOFRAN) IV, oxyCODONE-acetaminophen, sodium chloride flush  Allergies:    Allergies  Allergen Reactions  . Bee Venom Anaphylaxis    Intolerance HONEY BEES    Social History:   Social History   Socioeconomic History  . Marital status: Single    Spouse name: Not on file  . Number of children: Not on file  . Years of education: Not on file  . Highest education level: Not on file  Occupational History  . Not on file  Social Needs  . Financial resource strain: Not on file  . Food insecurity:    Worry: Not on  file    Inability: Not on file  . Transportation needs:    Medical: Not on file    Non-medical: Not on file  Tobacco Use  . Smoking status: Never Smoker  . Smokeless tobacco: Never Used  Substance and Sexual Activity  . Alcohol use: No    Comment: history of alcohol abuse but quit in 2008   . Drug use: No  . Sexual activity: Yes  Lifestyle  . Physical activity:    Days per week: Not on file    Minutes per session: Not on file  . Stress: Not on file  Relationships  . Social connections:    Talks on phone: Not on file    Gets together: Not on file    Attends religious service: Not on file    Active member of club or organization: Not on file    Attends meetings of clubs or organizations: Not on file  Relationship status: Not on file  . Intimate partner violence:    Fear of current or ex partner: Not on file    Emotionally abused: Not on file    Physically abused: Not on file    Forced sexual activity: Not on file  Other Topics Concern  . Not on file  Social History Narrative   ** Merged History Encounter **         Family History:    Family History  Problem Relation Age of Onset  . Diabetes Mother   . COPD Mother   . CAD Father        CABG in his 6's  . Diabetes Sister   . Atrial fibrillation Brother   . Colon cancer Neg Hx       Review of Systems    General:  No chills, fever, night sweats or weight changes.  Cardiovascular:  No chest pain, dyspnea on exertion, edema, orthopnea,  paroxysmal nocturnal dyspnea. Positive for palpitations.  Dermatological: No rash, lesions/masses Respiratory: No cough, dyspnea Urologic: No hematuria, dysuria Abdominal:   No nausea, vomiting, diarrhea, bright red blood per rectum, melena, or hematemesis Neurologic:  No visual changes, wkns, changes in mental status. MSK: Positive for right hand pain.   All other systems reviewed and are otherwise negative except as noted above.  Physical Exam/Data    Vitals:   09/01/18  0500 09/01/18 0600 09/01/18 0700 09/01/18 0805  BP: 101/72 93/65 127/79   Pulse: 71 63 78   Resp: 19 15 13    Temp:    98.8 F (37.1 C)  TempSrc:      SpO2: 95% 96% 94%   Weight: 109.7 kg     Height:        Intake/Output Summary (Last 24 hours) at 09/01/2018 0846 Last data filed at 09/01/2018 0700 Gross per 24 hour  Intake 52.84 ml  Output 350 ml  Net -297.16 ml   Filed Weights   08/31/18 1501 09/01/18 0022 09/01/18 0500  Weight: 111.1 kg 109.7 kg 109.7 kg   Body mass index is 33.73 kg/m.   General: Pleasant, Caucasian male appearing in NAD Psych: Normal affect. Neuro: Alert and oriented X 3. Moves all extremities spontaneously. HEENT: Normal  Neck: Supple without bruits or JVD. Lungs:  Resp regular and unlabored, CTA without wheezing or rales. Heart: Irregularly irregular, no s3, s4, or murmurs. Abdomen: Soft, non-tender, non-distended, BS + x 4.  Extremities: No clubbing or cyanosis. Trace edema along RLE, no edema along LLE. DP/PT/Radials 2+ and equal bilaterally.  Telemetry:  Telemetry was personally reviewed and demonstrates: Atrial flutter, with variable 3:1 - 5:1 conduction with rates in the 60's to 80's.     Labs/Studies     Relevant CV Studies:  Echocardiogram: 02/2015 Study Conclusions  - Left ventricle: The cavity size was normal. Wall thickness was   normal. Systolic function was normal. The estimated ejection   fraction was in the range of 60% to 65%. Wall motion was normal;   there were no regional wall motion abnormalities. Left   ventricular diastolic function parameters were normal. - Aortic valve: There was no stenosis. - Mitral valve: There was no significant regurgitation. - Right ventricle: The cavity size was normal. Systolic function   was normal. - Pulmonary arteries: PA peak pressure: 35 mm Hg (S). - Inferior vena cava: The vessel was normal in size. The   respirophasic diameter changes were in the normal range (>= 50%),   consistent  with normal  central venous pressure.  Impressions:  - Normal LV size and systolic function, EF 39-76%. Normal diastolic   function. Normal RV size and systolic function. No significant   valvular abnormalities. Borderline elevated PA pressure.  Laboratory Data:  Chemistry Recent Labs  Lab 08/31/18 1546 09/01/18 0738  NA 138 139  K 3.9 4.3  CL 104 103  CO2 24 25  GLUCOSE 274* 111*  BUN 24* 25*  CREATININE 1.22 1.27*  CALCIUM 9.3 9.2  GFRNONAA >60 >60  GFRAA >60 >60  ANIONGAP 10 11    Recent Labs  Lab 08/31/18 1546  PROT 7.5  ALBUMIN 3.9  AST 19  ALT 17  ALKPHOS 68  BILITOT 0.5   Hematology Recent Labs  Lab 08/31/18 1546 09/01/18 0738  WBC 8.7 8.4  RBC 4.89 4.96  HGB 13.0 13.1  HCT 40.8 42.4  MCV 83.4 85.5  MCH 26.6 26.4  MCHC 31.9 30.9  RDW 13.7 13.9  PLT 217 244   Cardiac Enzymes Recent Labs  Lab 08/31/18 1546 08/31/18 1915 09/01/18 0046 09/01/18 0738  TROPONINI <0.03 <0.03 <0.03 <0.03   No results for input(s): TROPIPOC in the last 168 hours.  BNP Recent Labs  Lab 08/31/18 1547  BNP 46.0    DDimer No results for input(s): DDIMER in the last 168 hours.  Radiology/Studies:  Dg Chest 2 View  Result Date: 08/31/2018 CLINICAL DATA:  Tachycardia EXAM: CHEST - 2 VIEW COMPARISON:  04/27/2018 FINDINGS: Cardiac shadow is within normal limits. The lungs are well aerated bilaterally. No focal infiltrate or sizable effusion is seen. Stable pleural thickening is noted on the right. Old rib fractures are also noted on the right. No acute bony abnormality is noted. IMPRESSION: No acute abnormality noted. Electronically Signed   By: Inez Catalina M.D.   On: 08/31/2018 16:23   Dg Hand Complete Right  Result Date: 08/31/2018 CLINICAL DATA:  Blunt trauma 1 month ago with persistent hand pain, initial encounter EXAM: RIGHT HAND - COMPLETE 3+ VIEW COMPARISON:  None. FINDINGS: Degenerative changes of the third MCP joint are noted. Similar changes are noted at  the third PIP joint no acute fracture or dislocation is seen. Mild soft tissue swelling is noted as well. IMPRESSION: Degenerative changes as described. Electronically Signed   By: Inez Catalina M.D.   On: 08/31/2018 16:24   Dg Foot Complete Right  Result Date: 08/31/2018 CLINICAL DATA:  Right foot pain for several days, no known injury, initial encounter EXAM: RIGHT FOOT COMPLETE - 3+ VIEW COMPARISON:  None. FINDINGS: Postsurgical changes in the distal tibia and fibula are noted. Degenerative change at the first MTP joint with mild hallux valgus deformity is seen. No acute fracture or dislocation is noted. Tarsal degenerative changes and calcaneal spurring is noted. Diffuse vascular calcifications are seen. IMPRESSION: Multifocal degenerative and postoperative changes without acute abnormality. Electronically Signed   By: Inez Catalina M.D.   On: 08/31/2018 16:31     Assessment & Plan    1. New-Onset Atrial Flutter - reports a history of irregular heart rate for over 20+ years but was in NSR by review of prior EKG's in 2017. Initial EKG on admission shows what appears most consistent with atrial flutter and he is in atrial flutter with 3:1 conduction by review of telemetry.  - Labs show WBC 8.7, Hgb 13.0, platelets 217, Na+ 138, K+ 3.9, and creatinine 1.22. Magnesium 1.6 (will replace at this time). BNP 46. TSH 4.939 with Free T4 pending. Initial and cyclic troponin  values have been negative. Echocardiogram is pending to assess LV function and wall motion.  - he is currently on IV Cardizem at 5mg /hr with short-acting Cardizem ordered. Would anticipate switching to Cardizem CD prior to discharge unless EF reduced on echocardiogram.  - This patients CHA2DS2-VASc Score and unadjusted Ischemic Stroke Rate (% per year) is equal to 7.2 % stroke rate/year from a score of 5 (HTN, DM, Coronary Calcifications by prior CTA, and prior DVT (2)). He has been started on Eliquis 5mg  BID for anticoagulation. Given the  unknown duration of his arrhythmia, would anticipate 3 weeks of anticoagulation then could consider DCCV if still in atrial flutter at that time and he has been compliant with anticoagulation.   2. HTN - BP has been variable at 93/64 - 172/95 since admission.  - on Lisinopril 20mg  daily PTA. Currently held given the need for Cardizem.   3. IDDM - Hgb A1c pending. Per admitting team.     For questions or updates, please contact [place] Please consult [email or web address] for contact info under Cardiology/STEMI.  Signed, Erma Heritage, PA-C 09/01/2018, 8:46 AM Pager: [phone]  Patient seen and discussed with PA Ahmed Prima, I agree with her documentation above. Presented with and pain, but also palpitations. In ER found to be in aflutter, new diagnosis for the patient. Started on dilt gtt.   K 3.9 Cr 1.22 WBC 8.7 Hgb 13 Plt 217 BNP 46 TSH 4.9 Mg 1.6 Trop negx3 CXR no acute process  EKG aflutter variable conduction   Patient presents with new diagnosis of aflutter. CHADS2Vasc score is 5 , he has been started on elquis. Rate control currently with diltiazem 30mg  q6hr oral and low dose dilt gtt, stop dilt gtt this AM and follow rates on oral alone. Plan would be continued rate control and eventual consolidation to long acting dilt. Consider DCCV as outpatient after 3 weeks of anticoagulation, he is rate controlled and asymptomatic, no strong indication for inpatient TEE/DCCV.    Carlyle Dolly MD

## 2018-09-01 NOTE — Progress Notes (Signed)
PROGRESS NOTE  Jeremy Burgess JGG:[id number] DOB: 26-Aug-1965 DOA: 08/31/2018 PCP: Doree Albee, MD  Brief History:  53 year old male with a history of diabetes mellitus, hypertension, provoked DVT right lower extremity and chronic pain secondary to previous traumatic injuries presented emergency department secondary to right hand pain and right foot pain.  During his evaluation, the patient was noted to have atrial fibrillation with RVR with a heart rate in the 140s.  Patient was placed on diltiazem drip and admitted for further evaluation.  The patient states that he was told that he had some type of dysrhythmia 20 years ago, but was never placed on any medications for it.  He denies any chest discomfort, shortness breath, palpitations, dizziness, syncope. Regarding his hand, the patient states that he had a traumatic injury on 07/28/2018 when he accidentally got his right middle finger stuck on the side of a dresser causing his right middle finger to be torqued and pulled to the medial aspect.  Since then, he has had swelling and pain of his right third MCP joint, and he is having difficulty making a fist.  He has had difficulty getting a referral to see a hand specialist.  In addition, the patient states that he has had right foot pain at his right first MTP joint for the past 3 days.  He denies any recent injury or trauma to his foot.  Assessment/Plan: Paroxysmal atrial fibrillation, with RVR -Patient now is back to sinus rhythm -CHADSVASc = 3 (HTN, DM, ASVD noted on CTA chest) -Continue apixaban -Echocardiogram -TSH 4.939 -Follow-up free T4 -Transition to oral diltiazem  Diabetes mellitus type 2 -Holding metformin -Continue reduced dose Levemir -NovoLog sliding scale -Hemoglobin A1c  Right hand pain status post traumatic injury -Suspect the patient has a tendon/tendon sheath injury, possible sagittal band injury to his right third dorsal tendon -Patient is to follow-up  with hand specialist -Try to place in a splint if available  Essential hypertension -Holding lisinopril to allow for BP margin to control his atrial fibrillation -Diltiazem started as above  Acute gouty arthritis -Affecting primarily the right first MTP joint -Start prednisone burst x3 days     Disposition Plan:   Home 1/21 or  1/22 Family Communication:  No Family at bedside  Consultants:  cards  Code Status:  FULL  DVT Prophylaxis: Apixaban   Procedures: As Listed in Progress Note Above  Antibiotics: None       Subjective: Patient denies fevers, chills, headache, chest pain, dyspnea, nausea, vomiting, diarrhea, abdominal pain, dysuria, hematuria, hematochezia, and melena. Patient complains of pain in his right hand and right first MCP joint.  His right hand pain is located in the right third MCP joint--moderate to severe  Objective: Vitals:   09/01/18 0400 09/01/18 0500 09/01/18 0600 09/01/18 0700  BP: 93/64 101/72 93/65 127/79  Pulse: 63 71 63 78  Resp: 15 19 15 13   Temp: 97.7 F (36.5 C)     TempSrc: Oral     SpO2: 95% 95% 96% 94%  Weight:  109.7 kg    Height:        Intake/Output Summary (Last 24 hours) at 09/01/2018 0734 Last data filed at 08/31/2018 2154 Gross per 24 hour  Intake 52.84 ml  Output -  Net 52.84 ml   Weight change:  Exam:   General:  Pt is alert, follows commands appropriately, not in acute distress  HEENT: No icterus, No thrush, No neck  mass, [place]/AT  Cardiovascular: RRR, S1/S2, no rubs, no gallops  Respiratory: CTA bilaterally, no wheezing, no crackles, no rhonchi  Abdomen: Soft/+BS, non tender, non distended, no guarding  Extremities: Edema and erythema on the right first MCP joint with tenderness to palpation.  There is no crepitance again with this.  Right third MCP but joint with edema and pain to palpation with edema.  There is no draining wounds.   Data Reviewed: I have personally reviewed following labs and  imaging studies Basic Metabolic Panel: Recent Labs  Lab 08/31/18 1546  NA 138  K 3.9  CL 104  CO2 24  GLUCOSE 274*  BUN 24*  CREATININE 1.22  CALCIUM 9.3   Liver Function Tests: Recent Labs  Lab 08/31/18 1546  AST 19  ALT 17  ALKPHOS 68  BILITOT 0.5  PROT 7.5  ALBUMIN 3.9   No results for input(s): LIPASE, AMYLASE in the last 168 hours. No results for input(s): AMMONIA in the last 168 hours. Coagulation Profile: No results for input(s): INR, PROTIME in the last 168 hours. CBC: Recent Labs  Lab 08/31/18 1546  WBC 8.7  NEUTROABS 6.1  HGB 13.0  HCT 40.8  MCV 83.4  PLT 217   Cardiac Enzymes: Recent Labs  Lab 08/31/18 1546 08/31/18 1915 09/01/18 0046  TROPONINI <0.03 <0.03 <0.03   BNP: Invalid input(s): POCBNP CBG: Recent Labs  Lab 08/31/18 2158  GLUCAP 165*   HbA1C: No results for input(s): HGBA1C in the last 72 hours. Urine analysis:    Component Value Date/Time   COLORURINE YELLOW 05/26/2016 [place] 05/26/2016 1727   LABSPEC 1.020 05/26/2016 1727   PHURINE 5.5 05/26/2016 1727   GLUCOSEU 100 (A) 05/26/2016 1727   HGBUR NEGATIVE 05/26/2016 1727   BILIRUBINUR NEGATIVE 05/26/2016 1727   KETONESUR NEGATIVE 05/26/2016 1727   PROTEINUR NEGATIVE 05/26/2016 1727   UROBILINOGEN 1.0 02/12/2015 1206   NITRITE NEGATIVE 05/26/2016 1727   LEUKOCYTESUR NEGATIVE 05/26/2016 1727   Sepsis Labs: @LABRCNTIP (procalcitonin:4,lacticidven:4) ) Recent Results (from the past 240 hour(s))  MRSA PCR Screening     Status: None   Collection Time: 09/01/18 12:19 AM  Result Value Ref Range Status   MRSA by PCR NEGATIVE NEGATIVE Final    Comment:        The GeneXpert MRSA Assay (FDA approved for NASAL specimens only), is one component of a comprehensive MRSA colonization surveillance program. It is not intended to diagnose MRSA infection nor to guide or monitor treatment for MRSA infections. Performed at [facility], [address]  [place], [place] [address]      Scheduled Meds: . apixaban  5 mg Oral BID  . diltiazem  30 mg Oral Q6H  . insulin aspart  0-15 Units Subcutaneous TID WC  . insulin detemir  60 Units Subcutaneous QHS  . lisinopril  20 mg Oral Daily  . sodium chloride flush  3 mL Intravenous Q12H   Continuous Infusions: . sodium chloride      Procedures/Studies: Dg Chest 2 View  Result Date: 08/31/2018 CLINICAL DATA:  Tachycardia EXAM: CHEST - 2 VIEW COMPARISON:  04/27/2018 FINDINGS: Cardiac shadow is within normal limits. The lungs are well aerated bilaterally. No focal infiltrate or sizable effusion is seen. Stable pleural thickening is noted on the right. Old rib fractures are also noted on the right. No acute bony abnormality is noted. IMPRESSION: No acute abnormality noted. Electronically Signed   By: Inez Catalina M.D.   On: 08/31/2018 16:23   Dg  Hand Complete Right  Result Date: 08/31/2018 CLINICAL DATA:  Blunt trauma 1 month ago with persistent hand pain, initial encounter EXAM: RIGHT HAND - COMPLETE 3+ VIEW COMPARISON:  None. FINDINGS: Degenerative changes of the third MCP joint are noted. Similar changes are noted at the third PIP joint no acute fracture or dislocation is seen. Mild soft tissue swelling is noted as well. IMPRESSION: Degenerative changes as described. Electronically Signed   By: Inez Catalina M.D.   On: 08/31/2018 16:24   Dg Foot Complete Right  Result Date: 08/31/2018 CLINICAL DATA:  Right foot pain for several days, no known injury, initial encounter EXAM: RIGHT FOOT COMPLETE - 3+ VIEW COMPARISON:  None. FINDINGS: Postsurgical changes in the distal tibia and fibula are noted. Degenerative change at the first MTP joint with mild hallux valgus deformity is seen. No acute fracture or dislocation is noted. Tarsal degenerative changes and calcaneal spurring is noted. Diffuse vascular calcifications are seen. IMPRESSION: Multifocal degenerative and postoperative changes without acute  abnormality. Electronically Signed   By: Inez Catalina M.D.   On: 08/31/2018 16:31    Orson Eva, DO  [facility] Pager [phone]  If 7PM-7AM, please contact night-coverage [email or web address] Password [facility] 09/01/2018, 7:34 AM   LOS: 0 days

## 2018-09-01 NOTE — Care Management Note (Addendum)
Case Management Note  Patient Details  Name: JAFARI MCKILLOP MRN: [id number] Date of Birth: Jul 29, 1966  Subjective/Objective:   HTN, AFib.                Action/Plan: CM consulted for CBG meter strips. He gets his strips at [facility]. Plans to can PCP for strip prescription.  He is aware that he can purchase without a prescription if he runs out.  New PCP -Dr.Gosrani as of November.   Patient has Medicaid, Eliquis is on the preferred drug list. Cost will be $3.   Expected Discharge Date:    09/01/18              Expected Discharge Plan:  Home/Self Care  In-House Referral:     Discharge planning Services  CM Consult  Post Acute Care Choice:  NA Choice offered to:  NA  DME Arranged:    DME Agency:     HH Arranged:    [place] Agency:     Status of Service:  Completed, signed off  If discussed at [employer] of Stay Meetings, dates discussed:    Additional Comments:  Sudais Banghart, Chauncey Reading, RN 09/01/2018, 10:42 AM

## 2018-09-01 NOTE — Discharge Instructions (Signed)

## 2018-09-01 NOTE — Discharge Summary (Addendum)
Physician Discharge Summary  Jeremy Burgess VHQ:[id number] DOB: 1966-07-14 DOA: 08/31/2018  PCP: Doree Albee, MD  Admit date: 08/31/2018 Discharge date: 09/01/2018  Admitted From: Home Disposition:  Home  Recommendations for Outpatient Follow-up:  1. Follow up with PCP in 1-2 weeks 2. Please obtain BMP/CBC in one week 3. Follow up cardiology 09/25/18    Discharge Condition: Stable CODE STATUS: FULL Diet recommendation: Heart Healthy / Carb Modified    Brief/Interim Summary: 53 year old male with a history of diabetes mellitus, hypertension, provoked DVT right lower extremity and chronic pain secondary to previous traumatic injuries presented emergency department secondary to right hand pain and right foot pain.  During his evaluation, the patient was noted to have atrial fibrillation with RVR with a heart rate in the 140s.  Patient was placed on diltiazem drip and admitted for further evaluation.  The patient states that he was told that he had some type of dysrhythmia 20 years ago, but was never placed on any medications for it.  He denies any chest discomfort, shortness breath, palpitations, dizziness, syncope. Regarding his hand, the patient states that he had a traumatic injury on 07/28/2018 when he accidentally got his right middle finger stuck on the side of a dresser causing his right middle finger to be torqued and pulled to the medial aspect.  Since then, he has had swelling and pain of his right third MCP joint, and he is having difficulty making a fist.  He has had difficulty getting a referral to see a hand specialist.  In addition, the patient states that he has had right foot pain at his right first MTP joint for the past 3 days.  He denies any recent injury or trauma to his foot.   Discharge Diagnoses:   Paroxysmal atrial flutter, with RVR -Patient now rate controlled -CHADSVASc = 5 (HTN, DM, ASVD noted on CTA chest, DVT) -Continue apixaban -Echocardiogram--EF  60-65%, no WMA, trivial TR -TSH 4.939 -Follow-up free T4--0.85 -Transition to oral diltiazem CD  Diabetes mellitus type 2 -Holding metformin--restart after d/c -Continue reduced dose Levemir -NovoLog sliding scale -Hemoglobin A1c--7.5  Right hand pain status post traumatic injury -Suspect the patient has a tendon/tendon sheath injury, possible sagittal band injury to his right third dorsal tendon -Patient is to follow-up with hand specialist   Essential hypertension -Holding lisinopril to allow for BP margin to control his atrial fibrillation -Diltiazem started as above  Acute gouty arthritis -Affecting primarily the right first MTP joint -Start prednisone burst x4 days  Discharge Instructions   Allergies as of 09/01/2018      Reactions   Bee Venom Anaphylaxis   Intolerance HONEY BEES      Medication List    STOP taking these medications   lisinopril 20 MG tablet Commonly known as:  PRINIVIL,ZESTRIL     TAKE these medications   apixaban 5 MG Tabs tablet Commonly known as:  ELIQUIS Take 1 tablet (5 mg total) by mouth 2 (two) times daily.   diltiazem 120 MG 24 hr capsule Commonly known as:  CARDIZEM CD Take 1 capsule (120 mg total) by mouth daily.   ibuprofen 200 MG tablet Commonly known as:  ADVIL,MOTRIN Take 600 mg by mouth every 6 (six) hours as needed.   insulin detemir 100 UNIT/ML injection Commonly known as:  LEVEMIR Inject 0.6 mLs (60 Units total) into the skin at bedtime.   JANUMET XR 50-1000 MG Tb24 Generic drug:  SitaGLIPtin-MetFORMIN HCl Take 1 tablet by mouth 2 (two) times daily.  predniSONE 50 MG tablet Commonly known as:  DELTASONE Take 1 tablet (50 mg total) by mouth daily with breakfast.      Follow-up Information    Erma Heritage, PA-C Follow up on 09/25/2018.   Specialties:  Physician Assistant, Cardiology Why:  [place] on 09/25/2018 at 3:00 PM with Bernerd Pho, PA-C (works with Dr. Harl Bowie).    Contact information: [address] [place] [address] [phone]          Allergies  Allergen Reactions  . Bee Venom Anaphylaxis    Intolerance HONEY BEES    Consultations:  cardiology   Procedures/Studies: Dg Chest 2 View  Result Date: 08/31/2018 CLINICAL DATA:  Tachycardia EXAM: CHEST - 2 VIEW COMPARISON:  04/27/2018 FINDINGS: Cardiac shadow is within normal limits. The lungs are well aerated bilaterally. No focal infiltrate or sizable effusion is seen. Stable pleural thickening is noted on the right. Old rib fractures are also noted on the right. No acute bony abnormality is noted. IMPRESSION: No acute abnormality noted. Electronically Signed   By: Inez Catalina M.D.   On: 08/31/2018 16:23   Dg Hand Complete Right  Result Date: 08/31/2018 CLINICAL DATA:  Blunt trauma 1 month ago with persistent hand pain, initial encounter EXAM: RIGHT HAND - COMPLETE 3+ VIEW COMPARISON:  None. FINDINGS: Degenerative changes of the third MCP joint are noted. Similar changes are noted at the third PIP joint no acute fracture or dislocation is seen. Mild soft tissue swelling is noted as well. IMPRESSION: Degenerative changes as described. Electronically Signed   By: Inez Catalina M.D.   On: 08/31/2018 16:24   Dg Foot Complete Right  Result Date: 08/31/2018 CLINICAL DATA:  Right foot pain for several days, no known injury, initial encounter EXAM: RIGHT FOOT COMPLETE - 3+ VIEW COMPARISON:  None. FINDINGS: Postsurgical changes in the distal tibia and fibula are noted. Degenerative change at the first MTP joint with mild hallux valgus deformity is seen. No acute fracture or dislocation is noted. Tarsal degenerative changes and calcaneal spurring is noted. Diffuse vascular calcifications are seen. IMPRESSION: Multifocal degenerative and postoperative changes without acute abnormality. Electronically Signed   By: Inez Catalina M.D.   On: 08/31/2018 16:31        Discharge Exam: Vitals:   09/01/18  1300 09/01/18 1400  BP: 110/73 105/66  Pulse: 65 63  Resp: 19 11  Temp:    SpO2: 95% 94%   Vitals:   09/01/18 1116 09/01/18 1200 09/01/18 1300 09/01/18 1400  BP:  132/79 110/73 105/66  Pulse:  79 65 63  Resp:  15 19 11   Temp: 98.1 F (36.7 C)     TempSrc: Oral     SpO2:  97% 95% 94%  Weight:      Height:        General: Pt is alert, awake, not in acute distress Cardiovascular: RRR, S1/S2 +, no rubs, no gallops Respiratory: CTA bilaterally, no wheezing, no rhonchi Abdominal: Soft, NT, ND, bowel sounds + Extremities: no edema, no cyanosis   The results of significant diagnostics from this hospitalization (including imaging, microbiology, ancillary and laboratory) are listed below for reference.    Significant Diagnostic Studies: Dg Chest 2 View  Result Date: 08/31/2018 CLINICAL DATA:  Tachycardia EXAM: CHEST - 2 VIEW COMPARISON:  04/27/2018 FINDINGS: Cardiac shadow is within normal limits. The lungs are well aerated bilaterally. No focal infiltrate or sizable effusion is seen. Stable pleural thickening is noted on the right. Old rib fractures  are also noted on the right. No acute bony abnormality is noted. IMPRESSION: No acute abnormality noted. Electronically Signed   By: Inez Catalina M.D.   On: 08/31/2018 16:23   Dg Hand Complete Right  Result Date: 08/31/2018 CLINICAL DATA:  Blunt trauma 1 month ago with persistent hand pain, initial encounter EXAM: RIGHT HAND - COMPLETE 3+ VIEW COMPARISON:  None. FINDINGS: Degenerative changes of the third MCP joint are noted. Similar changes are noted at the third PIP joint no acute fracture or dislocation is seen. Mild soft tissue swelling is noted as well. IMPRESSION: Degenerative changes as described. Electronically Signed   By: Inez Catalina M.D.   On: 08/31/2018 16:24   Dg Foot Complete Right  Result Date: 08/31/2018 CLINICAL DATA:  Right foot pain for several days, no known injury, initial encounter EXAM: RIGHT FOOT COMPLETE - 3+ VIEW  COMPARISON:  None. FINDINGS: Postsurgical changes in the distal tibia and fibula are noted. Degenerative change at the first MTP joint with mild hallux valgus deformity is seen. No acute fracture or dislocation is noted. Tarsal degenerative changes and calcaneal spurring is noted. Diffuse vascular calcifications are seen. IMPRESSION: Multifocal degenerative and postoperative changes without acute abnormality. Electronically Signed   By: Inez Catalina M.D.   On: 08/31/2018 16:31     Microbiology: Recent Results (from the past 240 hour(s))  MRSA PCR Screening     Status: None   Collection Time: 09/01/18 12:19 AM  Result Value Ref Range Status   MRSA by PCR NEGATIVE NEGATIVE Final    Comment:        The GeneXpert MRSA Assay (FDA approved for NASAL specimens only), is one component of a comprehensive MRSA colonization surveillance program. It is not intended to diagnose MRSA infection nor to guide or monitor treatment for MRSA infections. Performed at [facility], [address] [place], [place] [address]      Labs: Basic Metabolic Panel: Recent Labs  Lab 08/31/18 1546 09/01/18 0738  NA 138 139  K 3.9 4.3  CL 104 103  CO2 24 25  GLUCOSE 274* 111*  BUN 24* 25*  CREATININE 1.22 1.27*  CALCIUM 9.3 9.2  MG  --  1.6*   Liver Function Tests: Recent Labs  Lab 08/31/18 1546  AST 19  ALT 17  ALKPHOS 68  BILITOT 0.5  PROT 7.5  ALBUMIN 3.9   No results for input(s): LIPASE, AMYLASE in the last 168 hours. No results for input(s): AMMONIA in the last 168 hours. CBC: Recent Labs  Lab 08/31/18 1546 09/01/18 0738  WBC 8.7 8.4  NEUTROABS 6.1  --   HGB 13.0 13.1  HCT 40.8 42.4  MCV 83.4 85.5  PLT 217 244   Cardiac Enzymes: Recent Labs  Lab 08/31/18 1546 08/31/18 1915 09/01/18 0046 09/01/18 0738  TROPONINI <0.03 <0.03 <0.03 <0.03   BNP: Invalid input(s): POCBNP CBG: Recent Labs  Lab 08/31/18 2158 09/01/18 1112  GLUCAP 165* 142*    Time coordinating  discharge:  36 minutes  Signed:  Orson Eva, DO [facility] Pager: [phone] 09/01/2018, 3:09 PM

## 2018-09-01 NOTE — Care Management (Signed)
   Co-pay amount for Eliquis 5 mg. $3.00 Called pt. Pharmacy

## 2018-09-02 LAB — HIV ANTIBODY (ROUTINE TESTING W REFLEX): HIV Screen 4th Generation wRfx: NONREACTIVE

## 2018-09-25 ENCOUNTER — Encounter: Payer: Self-pay | Admitting: Student

## 2018-09-25 ENCOUNTER — Ambulatory Visit (INDEPENDENT_AMBULATORY_CARE_PROVIDER_SITE_OTHER): Payer: Medicaid Other | Admitting: Student

## 2018-09-25 ENCOUNTER — Encounter: Payer: Self-pay | Admitting: *Deleted

## 2018-09-25 VITALS — BP 166/94 | HR 79 | Ht 71.0 in | Wt 246.0 lb

## 2018-09-25 DIAGNOSIS — I4892 Unspecified atrial flutter: Secondary | ICD-10-CM

## 2018-09-25 DIAGNOSIS — Z79899 Other long term (current) drug therapy: Secondary | ICD-10-CM

## 2018-09-25 DIAGNOSIS — I10 Essential (primary) hypertension: Secondary | ICD-10-CM | POA: Diagnosis not present

## 2018-09-25 DIAGNOSIS — Z7901 Long term (current) use of anticoagulants: Secondary | ICD-10-CM

## 2018-09-25 MED ORDER — LISINOPRIL 10 MG PO TABS: 10.0000 mg | ORAL_TABLET | Freq: Every day | ORAL | 11 refills | Status: DC

## 2018-09-25 MED ORDER — APIXABAN 5 MG PO TABS: 5.0000 mg | ORAL_TABLET | Freq: Two times a day (BID) | ORAL | 11 refills | Status: DC

## 2018-09-25 MED ORDER — DILTIAZEM HCL ER COATED BEADS 120 MG PO CP24: 120.0000 mg | ORAL_CAPSULE | Freq: Every day | ORAL | 11 refills | Status: DC

## 2018-09-25 NOTE — Patient Instructions (Signed)
Medication Instructions:  Your physician has recommended you make the following change in your medication:  Take Lisinopril 10 mg Daily   If you need a refill on your cardiac medications before your next appointment, please call your pharmacy.   Lab work: Your physician recommends that you return for lab work in: When you go for pre-op.   If you have labs (blood work) drawn today and your tests are completely normal, you will receive your results only by: [name] MyChart Message (if you have MyChart) OR . A paper copy in the mail If you have any lab test that is abnormal or we need to change your treatment, we will call you to review the results.  Testing/Procedures: Your physician has recommended that you have a Cardioversion (DCCV). Electrical Cardioversion uses a jolt of electricity to your heart either through paddles or wired patches attached to your chest. This is a controlled, usually prescheduled, procedure. Defibrillation is done under light anesthesia in the hospital, and you usually go home the day of the procedure. This is done to get your heart back into a normal rhythm. You are not awake for the procedure. Please see the instruction sheet given to you today.    Follow-Up: At [facility], you and your health needs are our priority.  As part of our continuing mission to provide you with exceptional heart care, we have created designated Provider Care Teams.  These Care Teams include your primary Cardiologist (physician) and Advanced Practice Providers (APPs -  Physician Assistants and Nurse Practitioners) who all work together to provide you with the care you need, when you need it. You will need a follow up appointment in 3-4 weeks.  Please call our office 2 months in advance to schedule this appointment.  You may see Carlyle Dolly, MD or one of the following Advanced Practice Providers on your designated Care Team:   Bernerd Pho, PA-C [facility]) . Ermalinda Barrios,  PA-C ([place])  Any Other Special Instructions Will Be Listed Below (If Applicable). Thank you for choosing [facility]!

## 2018-09-25 NOTE — H&P (View-Only) (Signed)
Cardiology Office Note    Date:  09/25/2018   ID:  Jeremy Burgess, DOB [birth year], MRN [id number]  PCP:  Doree Albee, MD  Cardiologist: Carlyle Dolly, MD    Chief Complaint  Patient presents with  . Hospitalization Follow-up    History of Present Illness:    Jeremy Burgess is a 53 y.o. male with past medical history of HTN, Type 2 DM, and history of provoked DVT (occurring in 02/2015) who presents to the office today for hospital follow-up.  He was recently admitted to [facility] in 08/2018 for evaluation of right hand pain. Was found to be in atrial flutter with heart rate in the low 100's but was asymptomatic. Initial and cyclic troponin values remained negative. Echocardiogram showed a preserved EF of 60 to 65% with no regional wall motion abnormalities. He initially required IV Cardizem for rate control but this was transitioned to Cardizem CD 120mg  daily prior to discharge. Eliquis 5 mg twice daily was started for anticoagulation given his CHA2DS2-VASc Score of 5.  No further inpatient testing was pursued but it was recommended to consider DCCV as an outpatient after 3 weeks of anticoagulation.  In talking with the patient today, he reports overall doing well from a cardiac perspective since his recent admission. He was asymptomatic with his arrhythmia at that time and denies any recent chest pain, palpitations, dyspnea, orthopnea, PND, or lower extremity edema. He reports good compliance with his current medication regimen. He has not missed any doses of Eliquis and denies any evidence of active bleeding.  Blood pressure is elevated to 166/94 during today's visit and he does not have a blood pressure cuff at home to check this. Says he is still having pain along his right hand and feels like this is causing his elevated readings.   Past Medical History:  Diagnosis Date  . Accidentally struck by falling tree 02/08/2015  . Acute blood loss anemia 02/09/2015  . Acute  respiratory failure ([place]) 02/09/2015  . Arthritis   . Asthma   . Chronic pain due to trauma   . Diabetes mellitus   . Diabetes mellitus ([place])   . Heart murmur   . History of blood clots    DVT  . Hypertension   . Pneumothorax   . Postlaminectomy syndrome, lumbar region 11/13/2015  . Rib fracture   . Thrombocytopenia ([place]) 02/09/2015  . Traumatic closed displaced fracture of multiple ribs of right side 02/08/2015  . Traumatic hemopneumothorax 02/09/2015  . Upper GI bleeding 05/27/2016   Trivial. EGD unremarkable.    Past Surgical History:  Procedure Laterality Date  . BACK SURGERY    . CHOLECYSTECTOMY N/A 10/30/2016   Procedure: LAPAROSCOPIC CHOLECYSTECTOMY;  Surgeon: [name], MD;  Location: [facility];  Service: General;  Laterality: N/A;  . COLONOSCOPY N/A 07/25/2016   Dr. [name]: 9 mm tubular adenoma. 5 year surveillance   . ESOPHAGOGASTRODUODENOSCOPY  2009   EGD completed by Dr. [name] due to hematemesis. Findings of normal esophagus, tiny antral erosions, normal D1, D2  . ESOPHAGOGASTRODUODENOSCOPY N/A 05/27/2016   Dr. [name]: normal   . EXTERNAL FIXATION LEG Right 02/09/2015   Procedure: EXTERNAL FIXATION LEG;  Surgeon: [name], MD;  Location: [place];  Service: Orthopedics;  Laterality: Right;  . EXTERNAL FIXATION REMOVAL Right 02/14/2015   Procedure: REMOVAL EXTERNAL FIXATION LEG;  Surgeon: [name], MD;  Location: [place];  Service: Orthopedics;  Laterality: Right;  . FEMUR IM NAIL Right 02/09/2015  Procedure: INTRAMEDULLARY (IM) RETROGRADE FEMORAL NAILING;  Surgeon: [name], MD;  Location: [place];  Service: Orthopedics;  Laterality: Right;  . NOSE SURGERY    . ORIF ANKLE FRACTURE Right 02/14/2015   Procedure: OPEN REDUCTION INTERNAL FIXATION (ORIF) PILON FRACTURE;  Surgeon: [name], MD;  Location: [place];  Service: Orthopedics;  Laterality: Right;  . ORIF ULNAR FRACTURE Right 02/09/2015   Procedure: OPEN REDUCTION INTERNAL FIXATION (ORIF) ULNAR  FRACTURE;  Surgeon: [name], MD;  Location: [place];  Service: Orthopedics;  Laterality: Right;  . POLYPECTOMY  07/25/2016   Procedure: POLYPECTOMY;  Surgeon: [name], MD;  Location[facility];  Service: Endoscopy;;  colon    Current Medications: Outpatient Medications Prior to Visit  Medication Sig Dispense Refill  . ibuprofen (ADVIL,MOTRIN) 200 MG tablet Take 600 mg by mouth every 6 (six) hours as needed.    . insulin detemir (LEVEMIR) 100 UNIT/ML injection Inject 0.6 mLs (60 Units total) into the skin at bedtime.    [name] JANUMET XR 50-1000 MG TB24 Take 1 tablet by mouth 2 (two) times daily.  3  . apixaban (ELIQUIS) 5 MG TABS tablet Take 1 tablet (5 mg total) by mouth 2 (two) times daily. 60 tablet 1  . diltiazem (CARDIZEM CD) 120 MG 24 hr capsule Take 1 capsule (120 mg total) by mouth daily. 30 capsule 1  . predniSONE (DELTASONE) 50 MG tablet Take 1 tablet (50 mg total) by mouth daily with breakfast. 4 tablet 0   No facility-administered medications prior to visit.      Allergies:   Bee venom   Social History   Socioeconomic History  . Marital status: Single    Spouse name: Not on file  . Number of children: Not on file  . Years of education: Not on file  . Highest education level: Not on file  Occupational History  . Not on file  Social Needs  . Financial resource strain: Not on file  . Food insecurity:    Worry: Not on file    Inability: Not on file  . Transportation needs:    Medical: Not on file    Non-medical: Not on file  Tobacco Use  . Smoking status: Never Smoker  . Smokeless tobacco: Never Used  Substance and Sexual Activity  . Alcohol use: No    Comment: history of alcohol abuse but quit in 2008   . Drug use: No  . Sexual activity: Yes  Lifestyle  . Physical activity:    Days per week: Not on file    Minutes per session: Not on file  . Stress: Not on file  Relationships  . Social connections:    Talks on phone: Not on file    Gets  together: Not on file    Attends religious service: Not on file    Active member of club or organization: Not on file    Attends meetings of clubs or organizations: Not on file    Relationship status: Not on file  Other Topics Concern  . Not on file  Social History Narrative   ** Merged History Encounter **         Family History:  The patient's family history includes Atrial fibrillation in his brother; CAD in his father; COPD in his mother; Diabetes in his mother and sister.   Review of Systems:   Please see the history of present illness.     General:  No chills, fever, night  sweats or weight changes. Positive for right hand pain.  Cardiovascular:  No chest pain, dyspnea on exertion, edema, orthopnea, palpitations, paroxysmal nocturnal dyspnea. Dermatological: No rash, lesions/masses Respiratory: No cough, dyspnea Urologic: No hematuria, dysuria Abdominal:   No nausea, vomiting, diarrhea, bright red blood per rectum, melena, or hematemesis Neurologic:  No visual changes, wkns, changes in mental status. All other systems reviewed and are otherwise negative except as noted above.   Physical Exam:    VS:  BP (!) 166/94   Pulse 79   Ht 5\' 11"  (1.803 m)   Wt 246 lb (111.6 kg)   SpO2 97%   BMI 34.31 kg/m    General: Well developed, well nourished Caucasian male appearing in no acute distress. Head: Normocephalic, atraumatic, sclera non-icteric, no xanthomas, nares are without discharge.  Neck: No carotid bruits. JVD not elevated.  Lungs: Respirations regular and unlabored, without wheezes or rales.  Heart: Irregularly irregular. No S3 or S4.  No murmur, no rubs, or gallops appreciated. Abdomen: Soft, non-tender, non-distended with normoactive bowel sounds. No hepatomegaly. No rebound/guarding. No obvious abdominal masses. Msk:  Strength and tone appear normal for age. No joint deformities or effusions. Extremities: No clubbing or cyanosis. No lower extremity edema.  Distal  pedal pulses are 2+ bilaterally. Neuro: Alert and oriented X 3. Moves all extremities spontaneously. No focal deficits noted. Psych:  Responds to questions appropriately with a normal affect. Skin: No rashes or lesions noted  Wt Readings from Last 3 Encounters:  09/25/18 246 lb (111.6 kg)  09/01/18 241 lb 13.5 oz (109.7 kg)  05/24/18 245 lb (111.1 kg)     Studies/Labs Reviewed:   EKG:  EKG is ordered today.  The ekg ordered today demonstrates rate controlled atrial flutter, heart rate 79.  Recent Labs: 08/31/2018: ALT 17; B Natriuretic Peptide 46.0; TSH 4.939 09/01/2018: BUN 25; Creatinine, Ser 1.27; Hemoglobin 13.1; Magnesium 1.6; Platelets 244; Potassium 4.3; Sodium 139   Lipid Panel    Component Value Date/Time   CHOL 120 09/01/2018 0738   TRIG 58 09/01/2018 0738   HDL 35 (L) 09/01/2018 0738   CHOLHDL 3.4 09/01/2018 0738   VLDL 12 09/01/2018 0738   LDLCALC 73 09/01/2018 0738    Additional studies/ records that were reviewed today include:   Echocardiogram: 09/01/2018 Study Conclusions  - Left ventricle: The cavity size was normal. Wall thickness was   normal. Systolic function was normal. The estimated ejection   fraction was in the range of 60% to 65%. Wall motion was normal;   there were no regional wall motion abnormalities. The study is   not technically sufficient to allow evaluation of LV diastolic   function. - Aortic valve: Valve area (VTI): 2.83 cm^2. Valve area (Vmax):   2.61 cm^2. - Left atrium: The atrium was moderately to severely dilated. - Right atrium: The atrium was mildly dilated. - Atrial septum: No defect or patent foramen ovale was identified.  Assessment:    1. Atrial flutter, unspecified type ([place])   2. Current use of long term anticoagulation   3. Medication management   4. Essential hypertension      Plan:   In order of problems listed above:  1. Persistent Atrial Flutter/ Use of Long-Term Anticoagulation - Was diagnosed with  atrial flutter during his recent admission and was overall asymptomatic with this. He denies any recent palpitations, chest pain, or dyspnea. Repeat EKG today shows that he remains in rate controlled atrial flutter with heart rate in the 70's. As  outlined at the time of his recent admission, will plan for a DCCV in an effort to restore normal sinus rhythm. Risks and benefits reviewed and he agrees to proceed Of note, he did have a moderately to severely dilated LA which will likely influence the likelihood of maintaining NSR. If he does not convert, could consider EP referral for possible ablation. It is unclear if the patient would want to pursue further testing besides a DCCV given his asymptomatic state. He is meeting with an [facility] on Monday for further evaluation of his right hand pain. I reviewed with him today that if he undergoes DCCV, he would need to be on uninterrupted anticoagulation for 4 weeks following the procedure. - Will continue Cardizem CD 120 mg daily for rate control (holding AM of his procedure). Denies any evidence of active bleeding and remains on Eliquis 5 mg twice daily for anticoagulation.  2. HTN - BP is elevated at 166/94 during today's visit. He is currently taking Cardizem CD 120 mg daily and was previously on Lisinopril 20 mg daily prior to admission. Will plan to restart Lisinopril at 10 mg daily with a repeat BMET in 2 weeks. Pending BP response to this, may need to further titrate to original dosing of 20mg  daily.    Medication Adjustments/Labs and Tests Ordered: Current medicines are reviewed at length with the patient today.  Concerns regarding medicines are outlined above.  Medication changes, Labs and Tests ordered today are listed in the Patient Instructions below. Patient Instructions  Medication Instructions:  Your physician has recommended you make the following change in your medication:  Take Lisinopril 10 mg Daily   If you need a refill on your  cardiac medications before your next appointment, please call your pharmacy.   Lab work: Your physician recommends that you return for lab work in: When you go for pre-op.   If you have labs (blood work) drawn today and your tests are completely normal, you will receive your results only by: [name] MyChart Message (if you have MyChart) OR . A paper copy in the mail If you have any lab test that is abnormal or we need to change your treatment, we will call you to review the results.  Testing/Procedures: Your physician has recommended that you have a Cardioversion (DCCV). Electrical Cardioversion uses a jolt of electricity to your heart either through paddles or wired patches attached to your chest. This is a controlled, usually prescheduled, procedure. Defibrillation is done under light anesthesia in the hospital, and you usually go home the day of the procedure. This is done to get your heart back into a normal rhythm. You are not awake for the procedure. Please see the instruction sheet given to you today.    Follow-Up: At [facility], you and your health needs are our priority.  As part of our continuing mission to provide you with exceptional heart care, we have created designated Provider Care Teams.  These Care Teams include your primary Cardiologist (physician) and Advanced Practice Providers (APPs -  Physician Assistants and Nurse Practitioners) who all work together to provide you with the care you need, when you need it. You will need a follow up appointment in 3-4 weeks.  Please call our office 2 months in advance to schedule this appointment.  You may see Carlyle Dolly, MD or one of the following Advanced Practice Providers on your designated Care Team:   Bernerd Pho, PA-C [facility]) . Ermalinda Barrios, PA-C ([place])  Any Other  Special Instructions Will Be Listed Below (If Applicable). Thank you for choosing [facility]!     Signed, Erma Heritage, PA-C  09/25/2018 5:28 PM    [place] [address] [place], [place] [address] Phone: [phone] Fax: [phone]

## 2018-09-25 NOTE — Progress Notes (Signed)
Cardiology Office Note    Date:  09/25/2018   ID:  WINTON OFFORD, DOB 02/10/1966, MRN [id number]  PCP:  Doree Albee, MD  Cardiologist: Carlyle Dolly, MD    Chief Complaint  Patient presents with  . Hospitalization Follow-up    History of Present Illness:    Jeremy Burgess is a 53 y.o. male with past medical history of HTN, Type 2 DM, and history of provoked DVT (occurring in 02/2015) who presents to the office today for hospital follow-up.  He was recently admitted to [facility] in 08/2018 for evaluation of right hand pain. Was found to be in atrial flutter with heart rate in the low 100's but was asymptomatic. Initial and cyclic troponin values remained negative. Echocardiogram showed a preserved EF of 60 to 65% with no regional wall motion abnormalities. He initially required IV Cardizem for rate control but this was transitioned to Cardizem CD 120mg  daily prior to discharge. Eliquis 5 mg twice daily was started for anticoagulation given his CHA2DS2-VASc Score of 5.  No further inpatient testing was pursued but it was recommended to consider DCCV as an outpatient after 3 weeks of anticoagulation.  In talking with the patient today, he reports overall doing well from a cardiac perspective since his recent admission. He was asymptomatic with his arrhythmia at that time and denies any recent chest pain, palpitations, dyspnea, orthopnea, PND, or lower extremity edema. He reports good compliance with his current medication regimen. He has not missed any doses of Eliquis and denies any evidence of active bleeding.  Blood pressure is elevated to 166/94 during today's visit and he does not have a blood pressure cuff at home to check this. Says he is still having pain along his right hand and feels like this is causing his elevated readings.   Past Medical History:  Diagnosis Date  . Accidentally struck by falling tree 02/08/2015  . Acute blood loss anemia 02/09/2015  . Acute  respiratory failure ([place]) 02/09/2015  . Arthritis   . Asthma   . Chronic pain due to trauma   . Diabetes mellitus   . Diabetes mellitus ([place])   . Heart murmur   . History of blood clots    DVT  . Hypertension   . Pneumothorax   . Postlaminectomy syndrome, lumbar region 11/13/2015  . Rib fracture   . Thrombocytopenia ([place]) 02/09/2015  . Traumatic closed displaced fracture of multiple ribs of right side 02/08/2015  . Traumatic hemopneumothorax 02/09/2015  . Upper GI bleeding 05/27/2016   Trivial. EGD unremarkable.    Past Surgical History:  Procedure Laterality Date  . BACK SURGERY    . CHOLECYSTECTOMY N/A 10/30/2016   Procedure: LAPAROSCOPIC CHOLECYSTECTOMY;  Surgeon: [name], MD;  Location: [facility];  Service: General;  Laterality: N/A;  . COLONOSCOPY N/A 07/25/2016   Dr. [name]: 9 mm tubular adenoma. 5 year surveillance   . ESOPHAGOGASTRODUODENOSCOPY  2009   EGD completed by Dr. [name] due to hematemesis. Findings of normal esophagus, tiny antral erosions, normal D1, D2  . ESOPHAGOGASTRODUODENOSCOPY N/A 05/27/2016   Dr. [name]: normal   . EXTERNAL FIXATION LEG Right 02/09/2015   Procedure: EXTERNAL FIXATION LEG;  Surgeon: [name], MD;  Location: [place];  Service: Orthopedics;  Laterality: Right;  . EXTERNAL FIXATION REMOVAL Right 02/14/2015   Procedure: REMOVAL EXTERNAL FIXATION LEG;  Surgeon: [name], MD;  Location: [place];  Service: Orthopedics;  Laterality: Right;  . FEMUR IM NAIL Right 02/09/2015  Procedure: INTRAMEDULLARY (IM) RETROGRADE FEMORAL NAILING;  Surgeon: [name], MD;  Location: [place];  Service: Orthopedics;  Laterality: Right;  . NOSE SURGERY    . ORIF ANKLE FRACTURE Right 02/14/2015   Procedure: OPEN REDUCTION INTERNAL FIXATION (ORIF) PILON FRACTURE;  Surgeon: [name], MD;  Location: [place];  Service: Orthopedics;  Laterality: Right;  . ORIF ULNAR FRACTURE Right 02/09/2015   Procedure: OPEN REDUCTION INTERNAL FIXATION (ORIF) ULNAR  FRACTURE;  Surgeon: [name], MD;  Location: [place];  Service: Orthopedics;  Laterality: Right;  . POLYPECTOMY  07/25/2016   Procedure: POLYPECTOMY;  Surgeon: [name], MD;  Location: [facility];  Service: Endoscopy;;  colon    Current Medications: Outpatient Medications Prior to Visit  Medication Sig Dispense Refill  . ibuprofen (ADVIL,MOTRIN) 200 MG tablet Take 600 mg by mouth every 6 (six) hours as needed.    . insulin detemir (LEVEMIR) 100 UNIT/ML injection Inject 0.6 mLs (60 Units total) into the skin at bedtime.    [name] JANUMET XR 50-1000 MG TB24 Take 1 tablet by mouth 2 (two) times daily.  3  . apixaban (ELIQUIS) 5 MG TABS tablet Take 1 tablet (5 mg total) by mouth 2 (two) times daily. 60 tablet 1  . diltiazem (CARDIZEM CD) 120 MG 24 hr capsule Take 1 capsule (120 mg total) by mouth daily. 30 capsule 1  . predniSONE (DELTASONE) 50 MG tablet Take 1 tablet (50 mg total) by mouth daily with breakfast. 4 tablet 0   No facility-administered medications prior to visit.      Allergies:   Bee venom   Social History   Socioeconomic History  . Marital status: Single    Spouse name: Not on file  . Number of children: Not on file  . Years of education: Not on file  . Highest education level: Not on file  Occupational History  . Not on file  Social Needs  . Financial resource strain: Not on file  . Food insecurity:    Worry: Not on file    Inability: Not on file  . Transportation needs:    Medical: Not on file    Non-medical: Not on file  Tobacco Use  . Smoking status: Never Smoker  . Smokeless tobacco: Never Used  Substance and Sexual Activity  . Alcohol use: No    Comment: history of alcohol abuse but quit in 2008   . Drug use: No  . Sexual activity: Yes  Lifestyle  . Physical activity:    Days per week: Not on file    Minutes per session: Not on file  . Stress: Not on file  Relationships  . Social connections:    Talks on phone: Not on file    Gets  together: Not on file    Attends religious service: Not on file    Active member of club or organization: Not on file    Attends meetings of clubs or organizations: Not on file    Relationship status: Not on file  Other Topics Concern  . Not on file  Social History Narrative   ** Merged History Encounter **         Family History:  The patient's family history includes Atrial fibrillation in his brother; CAD in his father; COPD in his mother; Diabetes in his mother and sister.   Review of Systems:   Please see the history of present illness.     General:  No chills, fever, night  sweats or weight changes. Positive for right hand pain.  Cardiovascular:  No chest pain, dyspnea on exertion, edema, orthopnea, palpitations, paroxysmal nocturnal dyspnea. Dermatological: No rash, lesions/masses Respiratory: No cough, dyspnea Urologic: No hematuria, dysuria Abdominal:   No nausea, vomiting, diarrhea, bright red blood per rectum, melena, or hematemesis Neurologic:  No visual changes, wkns, changes in mental status. All other systems reviewed and are otherwise negative except as noted above.   Physical Exam:    VS:  BP (!) 166/94   Pulse 79   Ht 5\' 11"  (1.803 m)   Wt 246 lb (111.6 kg)   SpO2 97%   BMI 34.31 kg/m    General: Well developed, well nourished Caucasian male appearing in no acute distress. Head: Normocephalic, atraumatic, sclera non-icteric, no xanthomas, nares are without discharge.  Neck: No carotid bruits. JVD not elevated.  Lungs: Respirations regular and unlabored, without wheezes or rales.  Heart: Irregularly irregular. No S3 or S4.  No murmur, no rubs, or gallops appreciated. Abdomen: Soft, non-tender, non-distended with normoactive bowel sounds. No hepatomegaly. No rebound/guarding. No obvious abdominal masses. Msk:  Strength and tone appear normal for age. No joint deformities or effusions. Extremities: No clubbing or cyanosis. No lower extremity edema.  Distal  pedal pulses are 2+ bilaterally. Neuro: Alert and oriented X 3. Moves all extremities spontaneously. No focal deficits noted. Psych:  Responds to questions appropriately with a normal affect. Skin: No rashes or lesions noted  Wt Readings from Last 3 Encounters:  09/25/18 246 lb (111.6 kg)  09/01/18 241 lb 13.5 oz (109.7 kg)  05/24/18 245 lb (111.1 kg)     Studies/Labs Reviewed:   EKG:  EKG is ordered today.  The ekg ordered today demonstrates rate controlled atrial flutter, heart rate 79.  Recent Labs: 08/31/2018: ALT 17; B Natriuretic Peptide 46.0; TSH 4.939 09/01/2018: BUN 25; Creatinine, Ser 1.27; Hemoglobin 13.1; Magnesium 1.6; Platelets 244; Potassium 4.3; Sodium 139   Lipid Panel    Component Value Date/Time   CHOL 120 09/01/2018 0738   TRIG 58 09/01/2018 0738   HDL 35 (L) 09/01/2018 0738   CHOLHDL 3.4 09/01/2018 0738   VLDL 12 09/01/2018 0738   LDLCALC 73 09/01/2018 0738    Additional studies/ records that were reviewed today include:   Echocardiogram: 09/01/2018 Study Conclusions  - Left ventricle: The cavity size was normal. Wall thickness was   normal. Systolic function was normal. The estimated ejection   fraction was in the range of 60% to 65%. Wall motion was normal;   there were no regional wall motion abnormalities. The study is   not technically sufficient to allow evaluation of LV diastolic   function. - Aortic valve: Valve area (VTI): 2.83 cm^2. Valve area (Vmax):   2.61 cm^2. - Left atrium: The atrium was moderately to severely dilated. - Right atrium: The atrium was mildly dilated. - Atrial septum: No defect or patent foramen ovale was identified.  Assessment:    1. Atrial flutter, unspecified type ([place])   2. Current use of long term anticoagulation   3. Medication management   4. Essential hypertension      Plan:   In order of problems listed above:  1. Persistent Atrial Flutter/ Use of Long-Term Anticoagulation - Was diagnosed with  atrial flutter during his recent admission and was overall asymptomatic with this. He denies any recent palpitations, chest pain, or dyspnea. Repeat EKG today shows that he remains in rate controlled atrial flutter with heart rate in the 70's. As  outlined at the time of his recent admission, will plan for a DCCV in an effort to restore normal sinus rhythm. Risks and benefits reviewed and he agrees to proceed Of note, he did have a moderately to severely dilated LA which will likely influence the likelihood of maintaining NSR. If he does not convert, could consider EP referral for possible ablation. It is unclear if the patient would want to pursue further testing besides a DCCV given his asymptomatic state. He is meeting with an [facility] on Monday for further evaluation of his right hand pain. I reviewed with him today that if he undergoes DCCV, he would need to be on uninterrupted anticoagulation for 4 weeks following the procedure. - Will continue Cardizem CD 120 mg daily for rate control (holding AM of his procedure). Denies any evidence of active bleeding and remains on Eliquis 5 mg twice daily for anticoagulation.  2. HTN - BP is elevated at 166/94 during today's visit. He is currently taking Cardizem CD 120 mg daily and was previously on Lisinopril 20 mg daily prior to admission. Will plan to restart Lisinopril at 10 mg daily with a repeat BMET in 2 weeks. Pending BP response to this, may need to further titrate to original dosing of 20mg  daily.    Medication Adjustments/Labs and Tests Ordered: Current medicines are reviewed at length with the patient today.  Concerns regarding medicines are outlined above.  Medication changes, Labs and Tests ordered today are listed in the Patient Instructions below. Patient Instructions  Medication Instructions:  Your physician has recommended you make the following change in your medication:  Take Lisinopril 10 mg Daily   If you need a refill on your  cardiac medications before your next appointment, please call your pharmacy.   Lab work: Your physician recommends that you return for lab work in: When you go for pre-op.   If you have labs (blood work) drawn today and your tests are completely normal, you will receive your results only by: [name] MyChart Message (if you have MyChart) OR . A paper copy in the mail If you have any lab test that is abnormal or we need to change your treatment, we will call you to review the results.  Testing/Procedures: Your physician has recommended that you have a Cardioversion (DCCV). Electrical Cardioversion uses a jolt of electricity to your heart either through paddles or wired patches attached to your chest. This is a controlled, usually prescheduled, procedure. Defibrillation is done under light anesthesia in the hospital, and you usually go home the day of the procedure. This is done to get your heart back into a normal rhythm. You are not awake for the procedure. Please see the instruction sheet given to you today.    Follow-Up: At [facility], you and your health needs are our priority.  As part of our continuing mission to provide you with exceptional heart care, we have created designated Provider Care Teams.  These Care Teams include your primary Cardiologist (physician) and Advanced Practice Providers (APPs -  Physician Assistants and Nurse Practitioners) who all work together to provide you with the care you need, when you need it. You will need a follow up appointment in 3-4 weeks.  Please call our office 2 months in advance to schedule this appointment.  You may see Carlyle Dolly, MD or one of the following Advanced Practice Providers on your designated Care Team:   Bernerd Pho, PA-C [facility]) . Ermalinda Barrios, PA-C ([place])  Any Other  Special Instructions Will Be Listed Below (If Applicable). Thank you for choosing [facility]!     Signed, Erma Heritage, PA-C  09/25/2018 5:28 PM    Jeremy S. [address] [place],  [address] Phone: [phone] Fax: [phone]

## 2018-09-30 NOTE — Patient Instructions (Signed)
Jeremy Burgess  09/30/2018     @PREFPERIOPPHARMACY @   Your procedure is scheduled on  10/06/2018  Report to [facility] at  900   A.M.  Call this number if you have problems the morning of surgery:  [phone]   Remember:  Do not eat or drink after midnight.                        Take these medicines the morning of surgery with A SIP OF WATER  Diltiazem. Take 1/2 of your usual insulin dosage the night before your procedure.     Do not wear jewelry, make-up or nail polish.  Do not wear lotions, powders, or perfumes, or deodorant.  Do not shave 48 hours prior to surgery.  Men may shave face and neck.  Do not bring valuables to the hospital.  [facility] is not responsible for any belongings or valuables.  Contacts, dentures or bridgework may not be worn into surgery.  Leave your suitcase in the car.  After surgery it may be brought to your room.  For patients admitted to the hospital, discharge time will be determined by your treatment team.  Patients discharged the day of surgery will not be allowed to drive home.   Name and phone number of your driver:   family Special instructions:  DO NOT MISS ANY doses of your eliquis before your procedure.  Please read over the following fact sheets that you were given. Anesthesia Post-op Instructions and Care and Recovery After Surgery       Electrical Cardioversion, Care After This sheet gives you information about how to care for yourself after your procedure. Your health care provider may also give you more specific instructions. If you have problems or questions, contact your health care provider. What can I expect after the procedure? After the procedure, it is common to have:  Some redness on the skin where the shocks were given. Follow these instructions at home:   Do not drive for 24 hours if you were given a medicine to help you relax (sedative).  Take over-the-counter and prescription medicines only as told  by your health care provider.  Ask your health care provider how to check your pulse. Check it often.  Rest for 48 hours after the procedure or as told by your health care provider.  Avoid or limit your caffeine use as told by your health care provider. Contact a health care provider if:  You feel like your heart is beating too quickly or your pulse is not regular.  You have a serious muscle cramp that does not go away. Get help right away if:   You have discomfort in your chest.  You are dizzy or you feel faint.  You have trouble breathing or you are short of breath.  Your speech is slurred.  You have trouble moving an arm or leg on one side of your body.  Your fingers or toes turn cold or blue. This information is not intended to replace advice given to you by your health care provider. Make sure you discuss any questions you have with your health care provider. Document Released: 05/19/2013 Document Revised: 03/01/2016 Document Reviewed: 02/02/2016 Elsevier Interactive Patient Education  2019 [place], Care After These instructions provide you with information about caring for yourself after your procedure. Your health care provider may also give you more specific instructions. Your treatment has  been planned according to current medical practices, but problems sometimes occur. Call your health care provider if you have any problems or questions after your procedure. What can I expect after the procedure? After your procedure, you may:  Feel sleepy for several hours.  Feel clumsy and have poor balance for several hours.  Feel forgetful about what happened after the procedure.  Have poor judgment for several hours.  Feel nauseous or vomit.  Have a sore throat if you had a breathing tube during the procedure. Follow these instructions at home: For at least 24 hours after the procedure:      Have a responsible adult stay with you. It  is important to have someone help care for you until you are awake and alert.  Rest as needed.  Do not: ? Participate in activities in which you could fall or become injured. ? Drive. ? Use heavy machinery. ? Drink alcohol. ? Take sleeping pills or medicines that cause drowsiness. ? Make important decisions or sign legal documents. ? Take care of children on your own. Eating and drinking  Follow the diet that is recommended by your health care provider.  If you vomit, drink water, juice, or soup when you can drink without vomiting.  Make sure you have little or no nausea before eating solid foods. General instructions  Take over-the-counter and prescription medicines only as told by your health care provider.  If you have sleep apnea, surgery and certain medicines can increase your risk for breathing problems. Follow instructions from your health care provider about wearing your sleep device: ? Anytime you are sleeping, including during daytime naps. ? While taking prescription pain medicines, sleeping medicines, or medicines that make you drowsy.  If you smoke, do not smoke without supervision.  Keep all follow-up visits as told by your health care provider. This is important. Contact a health care provider if:  You keep feeling nauseous or you keep vomiting.  You feel light-headed.  You develop a rash.  You have a fever. Get help right away if:  You have trouble breathing. Summary  For several hours after your procedure, you may feel sleepy and have poor judgment.  Have a responsible adult stay with you for at least 24 hours or until you are awake and alert. This information is not intended to replace advice given to you by your health care provider. Make sure you discuss any questions you have with your health care provider. Document Released: 11/19/2015 Document Revised: 03/14/2017 Document Reviewed: 11/19/2015 Elsevier Interactive Patient Education  2019  [employer].

## 2018-10-05 ENCOUNTER — Encounter (HOSPITAL_COMMUNITY)
Admission: RE | Admit: 2018-10-05 | Discharge: 2018-10-05 | Disposition: A | Discharge status: Home / Self Care | Payer: Medicaid Other | Source: Ambulatory Visit | Attending: Cardiology | Admitting: Cardiology

## 2018-10-05 ENCOUNTER — Encounter (HOSPITAL_COMMUNITY): Payer: Self-pay

## 2018-10-05 DIAGNOSIS — Z01812 Encounter for preprocedural laboratory examination: Secondary | ICD-10-CM | POA: Diagnosis not present

## 2018-10-05 LAB — BASIC METABOLIC PANEL
Anion gap: 7 (ref 5–15)
BUN: 19 mg/dL (ref 6–20)
CALCIUM: 9 mg/dL (ref 8.9–10.3)
CO2: 24 mmol/L (ref 22–32)
Chloride: 107 mmol/L (ref 98–111)
Creatinine, Ser: 1.21 mg/dL (ref 0.61–1.24)
GFR calc Af Amer: >60 mL/min (ref >60)
GFR calc non Af Amer: >60 mL/min (ref >60)
Glucose, Bld: 117 mg/dL — ABNORMAL HIGH (ref 70–99)
Potassium: 4.4 mmol/L (ref 3.5–5.1)
Sodium: 138 mmol/L (ref 135–145)

## 2018-10-06 ENCOUNTER — Encounter (HOSPITAL_COMMUNITY)
Admission: RE | Disposition: A | Discharge status: Home / Self Care | Payer: Self-pay | Source: Home / Self Care | Attending: Cardiology

## 2018-10-06 ENCOUNTER — Ambulatory Visit (HOSPITAL_COMMUNITY): Payer: Medicaid Other | Admitting: Anesthesiology

## 2018-10-06 ENCOUNTER — Encounter (HOSPITAL_COMMUNITY): Payer: Self-pay | Admitting: *Deleted

## 2018-10-06 ENCOUNTER — Ambulatory Visit (HOSPITAL_COMMUNITY)
Admission: RE | Admit: 2018-10-06 | Discharge: 2018-10-06 | Disposition: A | Discharge status: Home / Self Care | Payer: Medicaid Other | Attending: Cardiology | Admitting: Cardiology

## 2018-10-06 DIAGNOSIS — I4892 Unspecified atrial flutter: Secondary | ICD-10-CM | POA: Insufficient documentation

## 2018-10-06 DIAGNOSIS — Z79899 Other long term (current) drug therapy: Secondary | ICD-10-CM | POA: Insufficient documentation

## 2018-10-06 DIAGNOSIS — Z86718 Personal history of other venous thrombosis and embolism: Secondary | ICD-10-CM | POA: Insufficient documentation

## 2018-10-06 DIAGNOSIS — I10 Essential (primary) hypertension: Secondary | ICD-10-CM | POA: Diagnosis not present

## 2018-10-06 DIAGNOSIS — Z7952 Long term (current) use of systemic steroids: Secondary | ICD-10-CM | POA: Insufficient documentation

## 2018-10-06 DIAGNOSIS — E119 Type 2 diabetes mellitus without complications: Secondary | ICD-10-CM | POA: Diagnosis not present

## 2018-10-06 DIAGNOSIS — Z7901 Long term (current) use of anticoagulants: Secondary | ICD-10-CM | POA: Diagnosis not present

## 2018-10-06 DIAGNOSIS — Z794 Long term (current) use of insulin: Secondary | ICD-10-CM | POA: Insufficient documentation

## 2018-10-06 DIAGNOSIS — I483 Typical atrial flutter: Secondary | ICD-10-CM | POA: Diagnosis not present

## 2018-10-06 HISTORY — PX: CARDIOVERSION: SHX1299

## 2018-10-06 LAB — GLUCOSE, CAPILLARY: Glucose-Capillary: 111 mg/dL — ABNORMAL HIGH (ref 70–99)

## 2018-10-06 SURGERY — CARDIOVERSION
Anesthesia: Monitor Anesthesia Care

## 2018-10-06 MED ORDER — PROPOFOL 10 MG/ML IV BOLUS
INTRAVENOUS | Status: DC | PRN
  Administered 2018-10-06: 60 mg via INTRAVENOUS

## 2018-10-06 MED ORDER — LACTATED RINGERS IV SOLN
INTRAVENOUS | Status: DC
  Administered 2018-10-06: 10:00:00 via INTRAVENOUS

## 2018-10-06 MED ORDER — MIDAZOLAM HCL 2 MG/2ML IJ SOLN
INTRAMUSCULAR | Status: AC
  Filled 2018-10-06: qty 2

## 2018-10-06 MED ORDER — PROPOFOL 500 MG/50ML IV EMUL
INTRAVENOUS | Status: DC | PRN
  Administered 2018-10-06: 200 ug/kg/min via INTRAVENOUS

## 2018-10-06 MED ORDER — PROPOFOL 10 MG/ML IV BOLUS
INTRAVENOUS | Status: AC
  Filled 2018-10-06: qty 40

## 2018-10-06 NOTE — Anesthesia Postprocedure Evaluation (Signed)
Anesthesia Post Note  Patient: Jeremy Burgess  Procedure(s) Performed: CARDIOVERSION (N/A )  Patient location during evaluation: PACU Anesthesia Type: MAC Level of consciousness: awake and alert and patient cooperative Pain management: satisfactory to patient Vital Signs Assessment: post-procedure vital signs reviewed and stable Respiratory status: spontaneous breathing Cardiovascular status: stable Postop Assessment: no apparent nausea or vomiting Anesthetic complications: no     Last Vitals:  Vitals:   10/06/18 0917 10/06/18 1045  BP: (!) 159/87 114/67  Resp: 20 18  Temp: 36.6 C 36.7 C  SpO2: 97% 100%    Last Pain:  Vitals:   10/06/18 1045  TempSrc:   PainSc: 0-No pain                 Madeeha Costantino

## 2018-10-06 NOTE — Anesthesia Procedure Notes (Signed)
Procedure Name: MAC Date/Time: 10/06/2018 10:11 AM Performed by: Vista Deck, CRNA Pre-anesthesia Checklist: Patient identified, Emergency Drugs available, Suction available, Timeout performed and Patient being monitored Patient Re-evaluated:Patient Re-evaluated prior to induction Oxygen Delivery Method: Nasal Cannula

## 2018-10-06 NOTE — Progress Notes (Signed)
Electrical Cardioversion Procedure Note Jeremy Burgess [id number] 01-08-1966  Procedure: Electrical Cardioversion Indications:  Atrial Flutter  Procedure Details Consent: Risks of procedure as well as the alternatives and risks of each were explained to the (patient/caregiver).  Consent for procedure obtained. Time Out: Verified patient identification, verified procedure, site/side was marked, verified correct patient position, special equipment/implants available, medications/allergies/relevent history reviewed, required imaging and test results available.  Performed  Patient placed on cardiac monitor, pulse oximetry, supplemental oxygen as necessary.  Sedation given: propofol Pacer pads placed anterior and posterior chest. Confirmed by Dr. Domenic Polite.   Cardioverted 1 time(s).  Cardioverted at 120J.  Evaluation Findings: Post procedure EKG shows: NSR Complications: None Patient did tolerate procedure well.   Charm Barges S 10/06/2018, 11:02 AM

## 2018-10-06 NOTE — CV Procedure (Signed)
Elective direct-current cardioversion  Indication: Persistent atrial flutter  Description of procedure: After informed consent was obtained the patient was taken to the PACU.  Anterior and posterior pads were placed in standard fashion and connected to a biphasic defibrillator.  Timeout was performed.  Sedation was provided by the anesthesia service with use of propofol, please refer to their records for details.  A sandbag was placed on the anterior chest pad.  Once adequate sedation was achieved, a single synchronized shock at 100 J was delivered with successful restoration of sinus rhythm.  This was confirmed by follow-up ECG.  Patient remained hemodynamically stable throughout and there were no immediate complications.  [name], M.D., F.A.C.C.

## 2018-10-06 NOTE — Discharge Instructions (Signed)
Chemical Cardioversion, Care After  This sheet gives you information about how to care for yourself after your procedure. Your health care provider may also give you more specific instructions. If you have problems or questions, contact your health care provider.  What can I expect after the procedure?  After the procedure, it is common to have:  · Fatigue or tiredness.  · Mild throat discomfort, if you had a test to look for blood clots in your heart (transesophageal echocardiogram, TEE).  Follow these instructions at home:  General instructions    · Take over-the-counter and prescription medicines only as told by your health care provider. You may need to take blood thinners (anticoagulants) or medicines to control your heart rhythm.  · If you are taking blood thinners:  ? Talk with your health care provider before you take any medicines that contain aspirin or NSAIDs. These medicines increase your risk for dangerous bleeding.  ? Take your medicine exactly as told, at the same time every day.  ? Avoid activities that could cause injury or bruising, and follow instructions about how to prevent falls.  ? Wear a medical alert bracelet or carry a card that lists what medicines you take.  · Keep all follow-up visits as told by your health care provider. This is important.  Eating and drinking    · Follow instructions from your health care provider about eating and drinking restrictions. You may have to follow a low-salt (low-sodium), low-fat, and low-cholesterol diet.  · Drink enough fluid to keep your urine pale yellow.  Activity  · Ask your health care provider what activities are safe for you.  · Do not drive for 24 hours if you were given a medicine to help you relax (sedative) during your procedure.  Lifestyle  · Limit alcohol intake to no more than 1 drink a day for nonpregnant women and 2 drinks a day for men. One drink equals 12 oz of beer, 5 oz of wine, or 1½ oz of hard liquor.  · Do not use any products that  contain nicotine or tobacco, such as cigarettes and e-cigarettes. If you need help quitting, ask your health care provider.  Contact a health care provider if:  · You have a fever.  · You have severe pain, and medicines do not help.  · You have problems taking your medicines.  · You have irregular heartbeats.  Get help right away if:    · Your heart rhythm changes.  · You have chest pain or shortness of breath.  · You feel dizzy.  · You faint.  · You have any symptoms of a stroke. "BE FAST" is an easy way to remember the main warning signs of a stroke:  ? B - Balance. Signs are dizziness, sudden trouble walking, or loss of balance.  ? E - Eyes. Signs are trouble seeing or a sudden change in vision.  ? F - Face. Signs are sudden weakness or numbness of the face, or the face or eyelid drooping on one side.  ? A - Arms. Signs are weakness or numbness in an arm. This happens suddenly and usually on one side of the body.  ? S - Speech. Signs are sudden trouble speaking, slurred speech, or trouble understanding what people say.  ? T - Time. Time to call emergency services. Write down what time symptoms started.  · You have other signs of a stroke, such as:  ? A sudden, severe headache with no known   cause.  ? Nausea or vomiting.  ? Seizure.  These symptoms may represent a serious problem that is an emergency. Do not wait to see if the symptoms will go away. Get medical help right away. Call your local emergency services (911 in the [place] Do not drive yourself to the hospital.  Summary  · Some fatigue is common after this procedure.  · You may have to take blood thinners (anticoagulants) or medicines to control your heart rhythm.  · If you have symptoms of a stroke, get help right away. "BE FAST" is an easy way to remember the main warning signs of a stroke.  This information is not intended to replace advice given to you by your health care provider. Make sure you discuss any questions you have with your health care  provider.  Document Released: 01/17/2017 Document Revised: 04/17/2018 Document Reviewed: 01/17/2017  Elsevier Interactive Patient Education © 2019 [employer]

## 2018-10-06 NOTE — Anesthesia Preprocedure Evaluation (Signed)
Anesthesia Evaluation  Patient identified by MRN, date of birth, ID band Patient awake    Reviewed: Allergy & Precautions, H&P , NPO status , Patient's Chart, lab work & pertinent test results  Airway Mallampati: II  TM Distance: >3 FB Neck ROM: full    Dental  (+) Poor Dentition, Missing   Pulmonary asthma ,    Pulmonary exam normal breath sounds clear to auscultation       Cardiovascular Exercise Tolerance: Good hypertension, + dysrhythmias Atrial Fibrillation  Rhythm:regular Rate:Normal     Neuro/Psych negative neurological ROS  negative psych ROS   GI/Hepatic negative GI ROS, Neg liver ROS,   Endo/Other  negative endocrine ROSdiabetes  Renal/GU Renal disease  negative genitourinary   Musculoskeletal   Abdominal   Peds  Hematology  (+) Blood dyscrasia, anemia ,   Anesthesia Other Findings   Reproductive/Obstetrics negative OB ROS                             Anesthesia Physical Anesthesia Plan  ASA: III  Anesthesia Plan: MAC   Post-op Pain Management:    Induction:   PONV Risk Score and Plan:   Airway Management Planned:   Additional Equipment:   Intra-op Plan:   Post-operative Plan:   Informed Consent: I have reviewed the patients History and Physical, chart, labs and discussed the procedure including the risks, benefits and alternatives for the proposed anesthesia with the patient or authorized representative who has indicated his/her understanding and acceptance.     Dental Advisory Given  Plan Discussed with: CRNA  Anesthesia Plan Comments:         Anesthesia Quick Evaluation

## 2018-10-06 NOTE — Transfer of Care (Signed)
Immediate Anesthesia Transfer of Care Note  Patient: Jeremy Burgess  Procedure(s) Performed: CARDIOVERSION (N/A )  Patient Location: PACU  Anesthesia Type:MAC  Level of Consciousness: awake, alert  and patient cooperative  Airway & Oxygen Therapy: Patient Spontanous Breathing and Patient connected to nasal cannula oxygen  Post-op Assessment: Report given to RN and Post -op Vital signs reviewed and stable  Post vital signs: Reviewed and stable  Last Vitals:  Vitals Value Taken Time  BP    Temp    Pulse    Resp    SpO2      Last Pain:  Vitals:   10/06/18 0917  TempSrc: Oral  PainSc: 0-No pain      Patients Stated Pain Goal: 9 (57/84/69 [id number])  Complications: No apparent anesthesia complications

## 2018-10-06 NOTE — Interval H&P Note (Signed)
History and Physical Interval Note:  10/06/2018 10:27 AM  Patient presents for elective cardioversion of persistent atrial flutter. He is on Eliquis and Cardizem CD. Anticoagulation has not been interrupted since initiation in January. Lab work reviewed. Patient does have URI with cough but is afebrile. He is on azithromycin. Consent obtained and he is ready to proceed.  [name], M.D., F.A.C.C.

## 2018-10-07 ENCOUNTER — Encounter (HOSPITAL_COMMUNITY): Payer: Self-pay | Admitting: Cardiology

## 2018-10-22 NOTE — Progress Notes (Signed)
Cardiology Office Note    Date:  10/23/2018   ID:  Jeremy Burgess, DOB [birth year], MRN [id number]  PCP:  Doree Albee, MD  Cardiologist: Carlyle Dolly, MD    Chief Complaint  Patient presents with  . Follow-up    s/p DCCV    History of Present Illness:    Jeremy Burgess is a 53 y.o. male with past medical history of persistent atrial flutter (diagnosed in 08/2018), HTN, Type 2 DM, and history of provoked DVT(occurring in 02/2015) who presents to the office today for follow-up from his recent DCCV.  He was last examined by myself on 09/25/2018 for hospital follow-up. He was overall doing well and denied any recent chest pain, palpitations, or dyspnea on exertion. A repeat EKG was performed and he was still in atrial flutter. Options were reviewed and a DCCV was arranged as an attempt to restore NSR given that he had been compliant with anticoagulation. This was performed by Dr. Domenic Polite on 10/06/2018 and he converted to NSR with a single synchronized 100 J shock. Was continued on his current medication regimen at that time including Eliquis, Cardizem CD, and Lisinopril.   In talking with the patient today, he reports overall doing well from a cardiac perspective since his recent procedure.  He was overall asymptomatic when in atrial flutter but denies any recent chest pain, dyspnea on exertion, or palpitations. No recent orthopnea, PND, or lower extremity edema. Was recently treated for bronchitis by his PCP and reports symptoms have continued to improve.  He reports good compliance with his current medication regimen and denies missing any recent doses.  Has remained on Eliquis for anticoagulation and denies any recent melena, hematochezia, or hematuria.   Past Medical History:  Diagnosis Date  . Accidentally struck by falling tree 02/08/2015  . Acute blood loss anemia 02/09/2015  . Acute respiratory failure ([place]) 02/09/2015  . Arthritis   . Asthma   . Atrial flutter ([place])     a. diagnosed in 08/2018 b. s/p DCCV in 09/2018 with return to NSR  . Chronic pain due to trauma   . Diabetes mellitus   . Diabetes mellitus ([place])   . Heart murmur   . History of blood clots    DVT  . Hypertension   . Pneumothorax   . Postlaminectomy syndrome, lumbar region 11/13/2015  . Rib fracture   . Thrombocytopenia ([place]) 02/09/2015  . Traumatic closed displaced fracture of multiple ribs of right side 02/08/2015  . Traumatic hemopneumothorax 02/09/2015  . Upper GI bleeding 05/27/2016   Trivial. EGD unremarkable.    Past Surgical History:  Procedure Laterality Date  . BACK SURGERY    . CARDIOVERSION N/A 10/06/2018   Procedure: CARDIOVERSION;  Surgeon: [name], MD;  Location: [facility];  Service: Cardiovascular;  Laterality: N/A;  . CHOLECYSTECTOMY N/A 10/30/2016   Procedure: LAPAROSCOPIC CHOLECYSTECTOMY;  Surgeon: [name], MD;  Location: [facility];  Service: General;  Laterality: N/A;  . COLONOSCOPY N/A 07/25/2016   Dr. [name]: 9 mm tubular adenoma. 5 year surveillance   . ESOPHAGOGASTRODUODENOSCOPY  2009   EGD completed by Dr. [name] due to hematemesis. Findings of normal esophagus, tiny antral erosions, normal D1, D2  . ESOPHAGOGASTRODUODENOSCOPY N/A 05/27/2016   Dr. [name]: normal   . EXTERNAL FIXATION LEG Right 02/09/2015   Procedure: EXTERNAL FIXATION LEG;  Surgeon: [name], MD;  Location: [facility];  Service: Orthopedics;  Laterality: Right;  . EXTERNAL FIXATION REMOVAL Right 02/14/2015  Procedure: REMOVAL EXTERNAL FIXATION LEG;  Surgeon: [name], MD;  Location: [facility];  Service: Orthopedics;  Laterality: Right;  . FEMUR IM NAIL Right 02/09/2015   Procedure: INTRAMEDULLARY (IM) RETROGRADE FEMORAL NAILING;  Surgeon: [name], MD;  Location: [facility];  Service: Orthopedics;  Laterality: Right;  . NOSE SURGERY    . ORIF ANKLE FRACTURE Right 02/14/2015   Procedure: OPEN REDUCTION INTERNAL FIXATION (ORIF) PILON FRACTURE;  Surgeon: [name], MD;   Location: [facility];  Service: Orthopedics;  Laterality: Right;  . ORIF ULNAR FRACTURE Right 02/09/2015   Procedure: OPEN REDUCTION INTERNAL FIXATION (ORIF) ULNAR FRACTURE;  Surgeon: [name], MD;  Location: [facility];  Service: Orthopedics;  Laterality: Right;  . POLYPECTOMY  07/25/2016   Procedure: POLYPECTOMY;  Surgeon: [name], MD;  [facility];  Service: Endoscopy;;  colon    Current Medications: Outpatient Medications Prior to Visit  Medication Sig Dispense Refill  . apixaban (ELIQUIS) 5 MG TABS tablet Take 1 tablet (5 mg total) by mouth 2 (two) times daily. 60 tablet 11  . ibuprofen (ADVIL,MOTRIN) 200 MG tablet Take 600 mg by mouth every 6 (six) hours as needed for headache or moderate pain.     [name] insulin detemir (LEVEMIR) 100 UNIT/ML injection Inject 0.6 mLs (60 Units total) into the skin at bedtime.    [name] JANUMET XR 50-1000 MG TB24 Take 1 tablet by mouth 2 (two) times daily.  3  . lisinopril (PRINIVIL,ZESTRIL) 10 MG tablet Take 1 tablet (10 mg total) by mouth daily. 30 tablet 11  . diltiazem (CARDIZEM CD) 120 MG 24 hr capsule Take 1 capsule (120 mg total) by mouth daily. 30 capsule 11   No facility-administered medications prior to visit.      Allergies:   Bee venom   Social History   Socioeconomic History  . Marital status: Single    Spouse name: Not on file  . Number of children: Not on file  . Years of education: Not on file  . Highest education level: Not on file  Occupational History  . Not on file  Social Needs  . Financial resource strain: Not on file  . Food insecurity:    Worry: Not on file    Inability: Not on file  . Transportation needs:    Medical: Not on file    Non-medical: Not on file  Tobacco Use  . Smoking status: Never Smoker  . Smokeless tobacco: Never Used  Substance and Sexual Activity  . Alcohol use: No    Comment: history of alcohol abuse but quit in 2008   . Drug use: No  . Sexual activity: Yes  Lifestyle  . Physical  activity:    Days per week: Not on file    Minutes per session: Not on file  . Stress: Not on file  Relationships  . Social connections:    Talks on phone: Not on file    Gets together: Not on file    Attends religious service: Not on file    Active member of club or organization: Not on file    Attends meetings of clubs or organizations: Not on file    Relationship status: Not on file  Other Topics Concern  . Not on file  Social History Narrative   ** Merged History Encounter **         Family History:  The patient's family history includes Atrial fibrillation in his brother; CAD in his father;  COPD in his mother; Diabetes in his mother and sister.   Review of Systems:   Please see the history of present illness.     General:  No chills, fever, night sweats or weight changes.  Cardiovascular:  No chest pain, dyspnea on exertion, edema, orthopnea, palpitations, paroxysmal nocturnal dyspnea. Dermatological: No rash, lesions/masses Respiratory: No dyspnea. Positive for productive cough (improving).  Urologic: No hematuria, dysuria Abdominal:   No nausea, vomiting, diarrhea, bright red blood per rectum, melena, or hematemesis Neurologic:  No visual changes, wkns, changes in mental status. All other systems reviewed and are otherwise negative except as noted above.   Physical Exam:    VS:  BP 128/70 (BP Location: Left Arm)   Pulse 65   Ht 5\' 10"  (1.778 m)   Wt 242 lb (109.8 kg)   SpO2 96%   BMI 34.72 kg/m    General: Well developed, well nourished Caucasian male appearing in no acute distress. Head: Normocephalic, atraumatic, sclera non-icteric, no xanthomas, nares are without discharge.  Neck: No carotid bruits. JVD not elevated.  Lungs: Respirations regular and unlabored, without wheezes or rales.  Heart: Regular rate and rhythm. No S3 or S4.  No murmur, no rubs, or gallops appreciated. Abdomen: Soft, non-tender, non-distended with normoactive bowel sounds. No  hepatomegaly. No rebound/guarding. No obvious abdominal masses. Msk:  Strength and tone appear normal for age. No joint deformities or effusions. Extremities: No clubbing or cyanosis. No lower extremity edema.  Distal pedal pulses are 2+ bilaterally. Neuro: Alert and oriented X 3. Moves all extremities spontaneously. No focal deficits noted. Psych:  Responds to questions appropriately with a normal affect. Skin: No rashes or lesions noted  Wt Readings from Last 3 Encounters:  10/23/18 242 lb (109.8 kg)  10/05/18 246 lb (111.6 kg)  09/25/18 246 lb (111.6 kg)     Studies/Labs Reviewed:   EKG:  EKG is ordered today.  The ekg ordered today demonstrates NSR, HR 64, with no acute ST changes when compared to prior tracings.   Recent Labs: 08/31/2018: ALT 17; B Natriuretic Peptide 46.0; TSH 4.939 09/01/2018: Hemoglobin 13.1; Magnesium 1.6; Platelets 244 10/05/2018: BUN 19; Creatinine, Ser 1.21; Potassium 4.4; Sodium 138   Lipid Panel    Component Value Date/Time   CHOL 120 09/01/2018 0738   TRIG 58 09/01/2018 0738   HDL 35 (L) 09/01/2018 0738   CHOLHDL 3.4 09/01/2018 0738   VLDL 12 09/01/2018 0738   LDLCALC 73 09/01/2018 0738    Additional studies/ records that were reviewed today include:   Echocardiogram: 08/2018  Study Conclusions - Left ventricle: The cavity size was normal. Wall thickness was   normal. Systolic function was normal. The estimated ejection   fraction was in the range of 60% to 65%. Wall motion was normal;   there were no regional wall motion abnormalities. The study is   not technically sufficient to allow evaluation of LV diastolic   function. - Aortic valve: Valve area (VTI): 2.83 cm^2. Valve area (Vmax):   2.61 cm^2. - Left atrium: The atrium was moderately to severely dilated. - Right atrium: The atrium was mildly dilated. - Atrial septum: No defect or patent foramen ovale was identified.   Assessment:    1. Atrial flutter, unspecified type ([place])    2. Current use of long term anticoagulation   3. Essential hypertension      Plan:   In order of problems listed above:  1. Persistent Atrial Flutter/ Use of Long-Term Anticoagulation - was diagnosed  in 08/2018 and underwent DCCV on 10/06/2018 following 3 weeks of anticoagulation with return to NSR with a single synchronized 100 J shock.  - He was overall asymptomatic when in the arrhythmia but denies any recent palpitations or dyspnea on exertion. He is maintaining normal sinus rhythm by examination and repeat EKG today. Will continue on anticoagulation for now as he is tolerating this well and is only 3 weeks out from his recent procedure. He did have a moderately to severely dilated left atrium at the time of his recent echocardiogram so I am unsure at the long-term success that he will maintain NSR. Patient in agreement to continue anticoagulation for now.  - in an effort to simplify his regimen and with intermittent bradycardia, will discontinue Cardizem CD given return to NSR. Reviewed he can take PRN if he develops any palpitations.   2. HTN - BP is well controlled at 128/70 during today's visit. He is currently on Cardizem CD 120 mg daily and Lisinopril 10 mg daily. Will plan to stop Cardizem CD now that he is back in normal sinus rhythm. He does not have a blood pressure cuff at home but does have a follow-up visit with his PCP in two weeks. If BP has become elevated, would recommend titration of Lisinopril back to 20 mg daily as this was his previous dose prior to his hospitalization.  Medication Adjustments/Labs and Tests Ordered: Current medicines are reviewed at length with the patient today.  Concerns regarding medicines are outlined above.  Medication changes, Labs and Tests ordered today are listed in the Patient Instructions below. Patient Instructions  Medication Instructions:  STOP Cardizem If you need a refill on your cardiac medications before your next appointment, please  call your pharmacy.   Lab work: None today If you have labs (blood work) drawn today and your tests are completely normal, you will receive your results only by: [name] MyChart Message (if you have MyChart) OR . A paper copy in the mail If you have any lab test that is abnormal or we need to change your treatment, we will call you to review the results.  Testing/Procedures: None toda  Follow-Up: At [facility], you and your health needs are our priority.  As part of our continuing mission to provide you with exceptional heart care, we have created designated Provider Care Teams.  These Care Teams include your primary Cardiologist (physician) and Advanced Practice Providers (APPs -  Physician Assistants and Nurse Practitioners) who all work together to provide you with the care you need, when you need it. You will need a follow up appointment in 3 months.  Please call our office 2 months in advance to schedule this appointment.  You may see Carlyle Dolly, MD or one of the following Advanced Practice Providers on your designated Care Team:   Bernerd Pho, PA-C [facility]) . Ermalinda Barrios, PA-C ([place])  Any Other Special Instructions Will Be Listed Below (If Applicable). None    Signed, Erma Heritage, PA-C  10/23/2018 4:01 PM    [facility] [address] [place], [place] [address] Phone: [phone] Fax: [phone]

## 2018-10-23 ENCOUNTER — Encounter: Payer: Self-pay | Admitting: Student

## 2018-10-23 ENCOUNTER — Ambulatory Visit (INDEPENDENT_AMBULATORY_CARE_PROVIDER_SITE_OTHER): Payer: Medicaid Other | Admitting: Student

## 2018-10-23 ENCOUNTER — Other Ambulatory Visit: Payer: Self-pay

## 2018-10-23 VITALS — BP 128/70 | HR 65 | Ht 70.0 in | Wt 242.0 lb

## 2018-10-23 DIAGNOSIS — I4892 Unspecified atrial flutter: Secondary | ICD-10-CM | POA: Diagnosis not present

## 2018-10-23 DIAGNOSIS — I10 Essential (primary) hypertension: Secondary | ICD-10-CM

## 2018-10-23 DIAGNOSIS — Z7901 Long term (current) use of anticoagulants: Secondary | ICD-10-CM | POA: Diagnosis not present

## 2018-10-23 NOTE — Patient Instructions (Signed)
Medication Instructions:  STOP Cardizem If you need a refill on your cardiac medications before your next appointment, please call your pharmacy.   Lab work: None today If you have labs (blood work) drawn today and your tests are completely normal, you will receive your results only by: [name] MyChart Message (if you have MyChart) OR . A paper copy in the mail If you have any lab test that is abnormal or we need to change your treatment, we will call you to review the results.  Testing/Procedures: None toda  Follow-Up: At [facility], you and your health needs are our priority.  As part of our continuing mission to provide you with exceptional heart care, we have created designated Provider Care Teams.  These Care Teams include your primary Cardiologist (physician) and Advanced Practice Providers (APPs -  Physician Assistants and Nurse Practitioners) who all work together to provide you with the care you need, when you need it. You will need a follow up appointment in 3 months.  Please call our office 2 months in advance to schedule this appointment.  You may see Carlyle Dolly, MD or one of the following Advanced Practice Providers on your designated Care Team:   Bernerd Pho, PA-C [facility]) . Ermalinda Barrios, PA-C ([place])  Any Other Special Instructions Will Be Listed Below (If Applicable). None

## 2018-10-30 ENCOUNTER — Encounter (INDEPENDENT_AMBULATORY_CARE_PROVIDER_SITE_OTHER): Payer: Self-pay | Admitting: Internal Medicine

## 2018-11-05 ENCOUNTER — Encounter (INDEPENDENT_AMBULATORY_CARE_PROVIDER_SITE_OTHER): Payer: Self-pay | Admitting: Internal Medicine

## 2019-01-26 ENCOUNTER — Encounter: Payer: Self-pay | Admitting: Cardiology

## 2019-01-26 ENCOUNTER — Telehealth (INDEPENDENT_AMBULATORY_CARE_PROVIDER_SITE_OTHER): Payer: Medicaid Other | Admitting: Cardiology

## 2019-01-26 ENCOUNTER — Other Ambulatory Visit: Payer: Self-pay

## 2019-01-26 VITALS — BP 140/80 | Ht 71.0 in | Wt 240.0 lb

## 2019-01-26 DIAGNOSIS — I4892 Unspecified atrial flutter: Secondary | ICD-10-CM

## 2019-01-26 NOTE — Progress Notes (Signed)
Virtual Visit via Telephone Note   This visit type was conducted due to national recommendations for restrictions regarding the COVID-19 Pandemic (e.g. social distancing) in an effort to limit this patient's exposure and mitigate transmission in our community.  Due to his co-morbid illnesses, this patient is at least at moderate risk for complications without adequate follow up.  This format is felt to be most appropriate for this patient at this time.  The patient did not have access to video technology/had technical difficulties with video requiring transitioning to audio format only (telephone).  All issues noted in this document were discussed and addressed.  No physical exam could be performed with this format.  Please refer to the patient's chart for his  consent to telehealth for [facility].   Date:  01/26/2019   ID:  Jeremy Burgess, DOB 08/22/1965, MRN [id number]  Patient Location: Home Provider Location: Office  PCP:  Doree Albee, MD  Cardiologist:  Carlyle Dolly, MD  Electrophysiologist:  None   Evaluation Performed:  Follow-Up Visit  Chief Complaint:  3 month follow up  History of Present Illness:    Jeremy Burgess is a 53 y.o. male seen today for follow up of the following medical problems.   1. Atrial flutter - new diagnosis during Jan 2020 admission - at that time started on anticoag, later cardioverted 10/06/2018 - during 10/23/2018 visit was still in [place] - currently on eliquis. Cardizem 120 stopped after cardioversion due to some low heart rates.   - no recent palpitations. - compliant with meds  2. HTN - recent changes by pcp. Lisinopril due to cough. Started on norvasc   The patient does not have symptoms concerning for COVID-19 infection (fever, chills, cough, or new shortness of breath).    Past Medical History:  Diagnosis Date  . Accidentally struck by falling tree 02/08/2015  . Acute blood loss anemia 02/09/2015  . Acute respiratory failure  ([place]) 02/09/2015  . Arthritis   . Asthma   . Atrial flutter ([place])    a. diagnosed in 08/2018 b. s/p DCCV in 09/2018 with return to NSR  . Chronic pain due to trauma   . Diabetes mellitus   . Diabetes mellitus ([place])   . Heart murmur   . History of blood clots    DVT  . Hypertension   . Pneumothorax   . Postlaminectomy syndrome, lumbar region 11/13/2015  . Rib fracture   . Thrombocytopenia ([place]) 02/09/2015  . Traumatic closed displaced fracture of multiple ribs of right side 02/08/2015  . Traumatic hemopneumothorax 02/09/2015  . Upper GI bleeding 05/27/2016   Trivial. EGD unremarkable.   Past Surgical History:  Procedure Laterality Date  . BACK SURGERY    . CARDIOVERSION N/A 10/06/2018   Procedure: CARDIOVERSION;  Surgeon: [name], MD;  Location: [facility];  Service: Cardiovascular;  Laterality: N/A;  . CHOLECYSTECTOMY N/A 10/30/2016   Procedure: LAPAROSCOPIC CHOLECYSTECTOMY;  Surgeon: [name], MD;  Location: [facility];  Service: General;  Laterality: N/A;  . COLONOSCOPY N/A 07/25/2016   Dr. [name]: 9 mm tubular adenoma. 5 year surveillance   . ESOPHAGOGASTRODUODENOSCOPY  2009   EGD completed by Dr. [name] due to hematemesis. Findings of normal esophagus, tiny antral erosions, normal D1, D2  . ESOPHAGOGASTRODUODENOSCOPY N/A 05/27/2016   Dr. [name]: normal   . EXTERNAL FIXATION LEG Right 02/09/2015   Procedure: EXTERNAL FIXATION LEG;  Surgeon: [name], MD;  Location: [place];  Service: Orthopedics;  Laterality: Right;  .  EXTERNAL FIXATION REMOVAL Right 02/14/2015   Procedure: REMOVAL EXTERNAL FIXATION LEG;  Surgeon: [name], MD;  Location: [place];  Service: Orthopedics;  Laterality: Right;  . FEMUR IM NAIL Right 02/09/2015   Procedure: INTRAMEDULLARY (IM) RETROGRADE FEMORAL NAILING;  Surgeon: [name], MD;  Location: [place];  Service: Orthopedics;  Laterality: Right;  . NOSE SURGERY    . ORIF ANKLE FRACTURE Right 02/14/2015   Procedure: OPEN REDUCTION INTERNAL  FIXATION (ORIF) PILON FRACTURE;  Surgeon: [name], MD;  Location: [place];  Service: Orthopedics;  Laterality: Right;  . ORIF ULNAR FRACTURE Right 02/09/2015   Procedure: OPEN REDUCTION INTERNAL FIXATION (ORIF) ULNAR FRACTURE;  Surgeon: [name], MD;  Location: [place];  Service: Orthopedics;  Laterality: Right;  . POLYPECTOMY  07/25/2016   Procedure: POLYPECTOMY;  Surgeon: [name], MD;  [facility];  Service: Endoscopy;;  colon     Current Meds  Medication Sig  . albuterol (VENTOLIN HFA) 108 (90 Base) MCG/ACT inhaler Inhale 1-2 puffs into the lungs every 6 (six) hours as needed for wheezing or shortness of breath.  [name] amLODipine (NORVASC) 5 MG tablet Take 5 mg by mouth daily.  [name] apixaban (ELIQUIS) 5 MG TABS tablet Take 1 tablet (5 mg total) by mouth 2 (two) times daily.  [name] ibuprofen (ADVIL,MOTRIN) 200 MG tablet Take 600 mg by mouth every 6 (six) hours as needed for headache or moderate pain.   [name] insulin detemir (LEVEMIR) 100 UNIT/ML injection Inject 0.6 mLs (60 Units total) into the skin at bedtime. (Patient taking differently: Inject 30 Units into the skin at bedtime. )  . JANUMET XR 50-1000 MG TB24 Take 1 tablet by mouth 2 (two) times daily.  . [DISCONTINUED] diltiazem (CARDIZEM CD) 120 MG 24 hr capsule Take 120 mg by mouth daily.     Allergies:   Bee venom   Social History   Tobacco Use  . Smoking status: Never Smoker  . Smokeless tobacco: Never Used  Substance Use Topics  . Alcohol use: No    Comment: history of alcohol abuse but quit in 2008   . Drug use: No     Family Hx: The patient's family history includes Atrial fibrillation in his brother; CAD in his father; COPD in his mother; Diabetes in his mother and sister. There is no history of Colon cancer.  ROS:   Please see the history of present illness.     All other systems reviewed and are negative.   Prior CV studies:   The following studies were reviewed today:  Echocardiogram:  08/2018  Study Conclusions - Left ventricle: The cavity size was normal. Wall thickness was normal. Systolic function was normal. The estimated ejection fraction was in the range of 60% to 65%. Wall motion was normal; there were no regional wall motion abnormalities. The study is not technically sufficient to allow evaluation of LV diastolic function. - Aortic valve: Valve area (VTI): 2.83 cm^2. Valve area (Vmax): 2.61 cm^2. - Left atrium: The atrium was moderately to severely dilated. - Right atrium: The atrium was mildly dilated. - Atrial septum: No defect or patent foramen ovale was identified.  Labs/Other Tests and Data Reviewed:    EKG:  No ECG reviewed.  Recent Labs: 08/31/2018: ALT 17; B Natriuretic Peptide 46.0; TSH 4.939 09/01/2018: Hemoglobin 13.1; Magnesium 1.6; Platelets 244 10/05/2018: BUN 19; Creatinine, Ser 1.21; Potassium 4.4; Sodium 138   Recent Lipid Panel Lab Results  Component Value Date/Time  CHOL 120 09/01/2018 07:38 AM   TRIG 58 09/01/2018 07:38 AM   HDL 35 (L) 09/01/2018 07:38 AM   CHOLHDL 3.4 09/01/2018 07:38 AM   LDLCALC 73 09/01/2018 07:38 AM    Wt Readings from Last 3 Encounters:  01/26/19 240 lb (108.9 kg)  10/23/18 242 lb (109.8 kg)  10/05/18 246 lb (111.6 kg)     Objective:    Vital Signs:  BP 140/80 Comment: taken 2 weeks ago @ pcp  Ht 5\' 11"  (1.803 m)   Wt 240 lb (108.9 kg)   BMI 33.47 kg/m    Normal affect. Normal speech pattern and tone. Comfortable, no apparent distress. No audible signs of SOB or wheezing  ASSESSMENT & PLAN:    1. Aflutter - no current symptoms, continue to monitor. Will need EKG at next office visit - continue anticoagulation, currently not requiring an av nodal agent  2. HTN - recent changes by pcp, I defer additional adjustments to pcp at this time.  COVID-19 Education: The signs and symptoms of COVID-19 were discussed with the patient and how to seek care for testing (follow up with PCP or  arrange E-visit).  The importance of social distancing was discussed today.  Time:   Today, I have spent 12 minutes with the patient with telehealth technology discussing the above problems.     Medication Adjustments/Labs and Tests Ordered: Current medicines are reviewed at length with the patient today.  Concerns regarding medicines are outlined above.   Tests Ordered: No orders of the defined types were placed in this encounter.   Medication Changes: No orders of the defined types were placed in this encounter.   Follow Up:  In Person in 4 month(s)  Signed, Carlyle Dolly, MD  01/26/2019 8:57 AM    [place]

## 2019-02-19 ENCOUNTER — Other Ambulatory Visit (HOSPITAL_COMMUNITY): Payer: Self-pay | Admitting: Pulmonary Disease

## 2019-02-19 ENCOUNTER — Other Ambulatory Visit: Payer: Self-pay | Admitting: Pulmonary Disease

## 2019-02-19 DIAGNOSIS — J8410 Pulmonary fibrosis, unspecified: Secondary | ICD-10-CM

## 2019-02-24 IMAGING — DX DG WRIST COMPLETE 3+V*L*
4 series · 4 of 4 positions shown · non-contrast
Comparison: 04/27/2018

CLINICAL DATA: Left wrist pain and swelling.

EXAM:
LEFT WRIST - COMPLETE 3+ VIEW

[wrist pa]
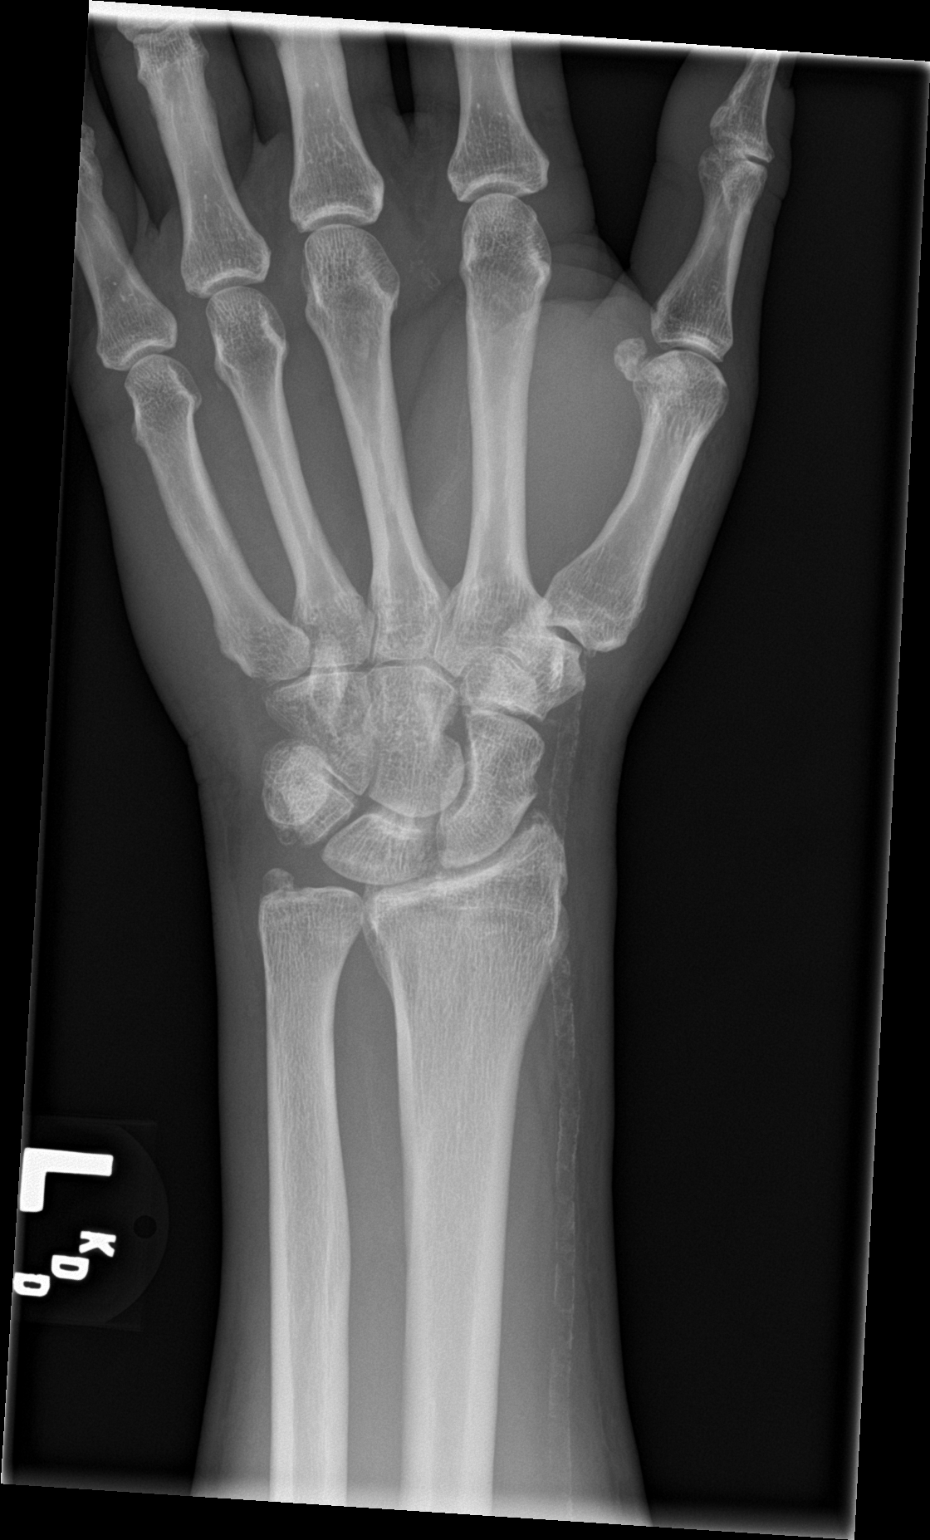

[wrist obl]
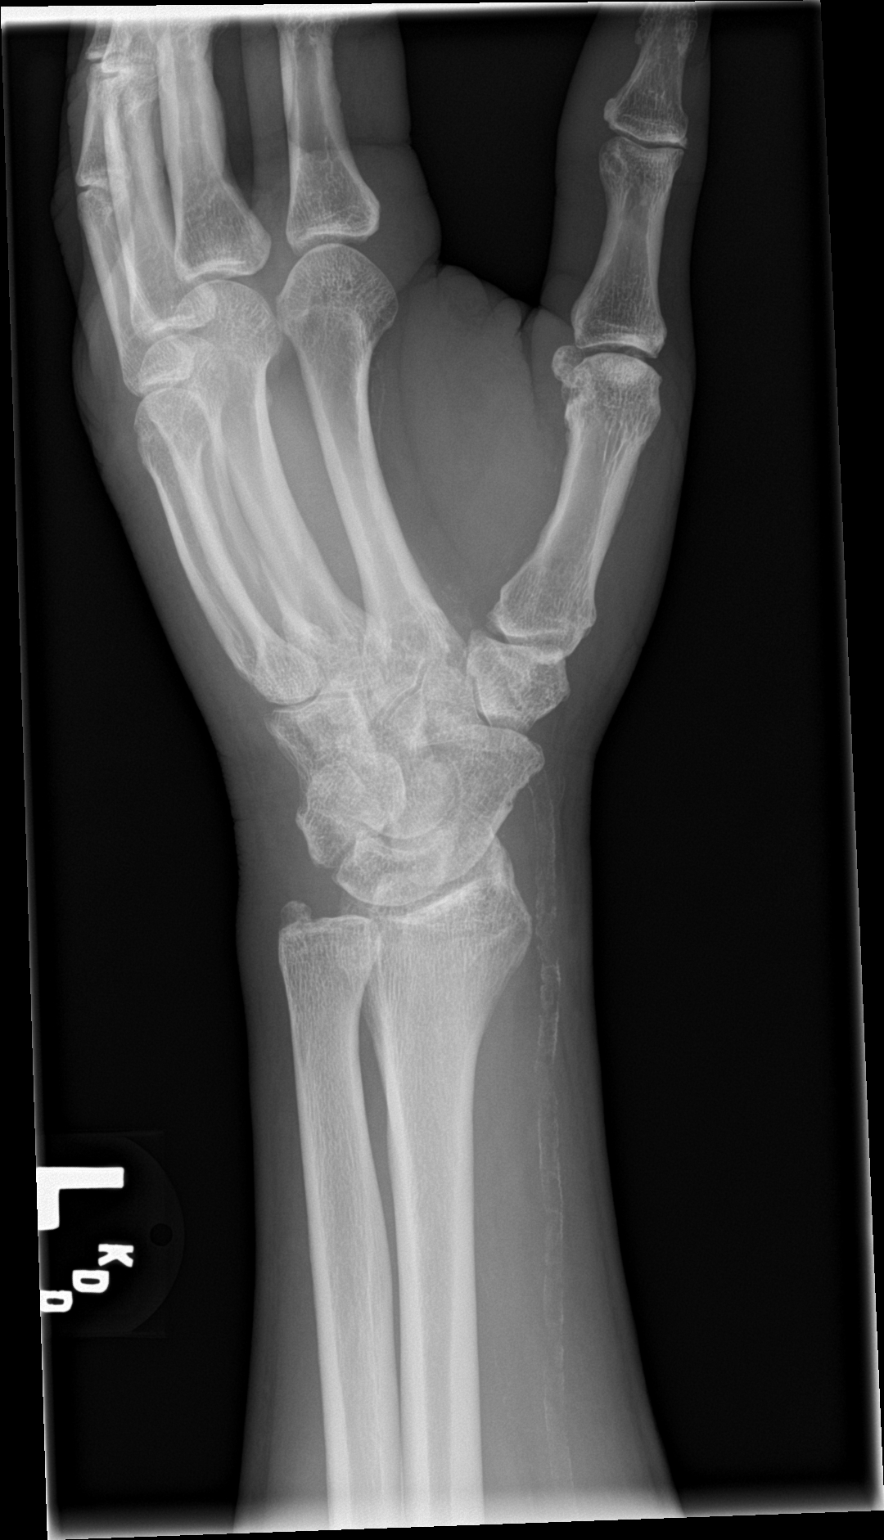

[wrist lat]
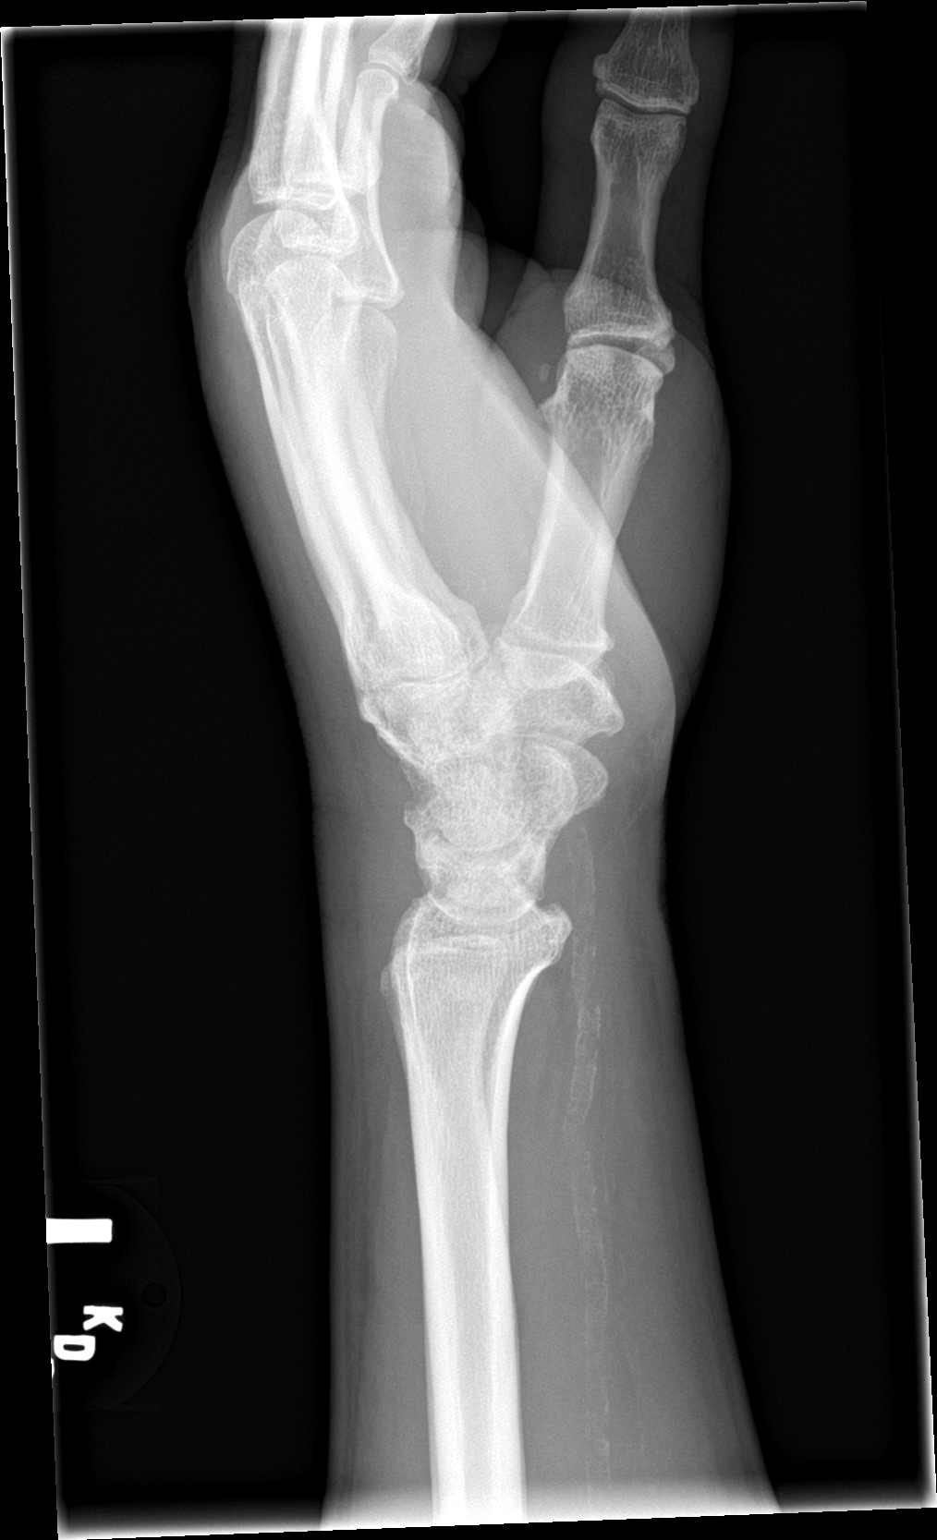

[wrist navicular]
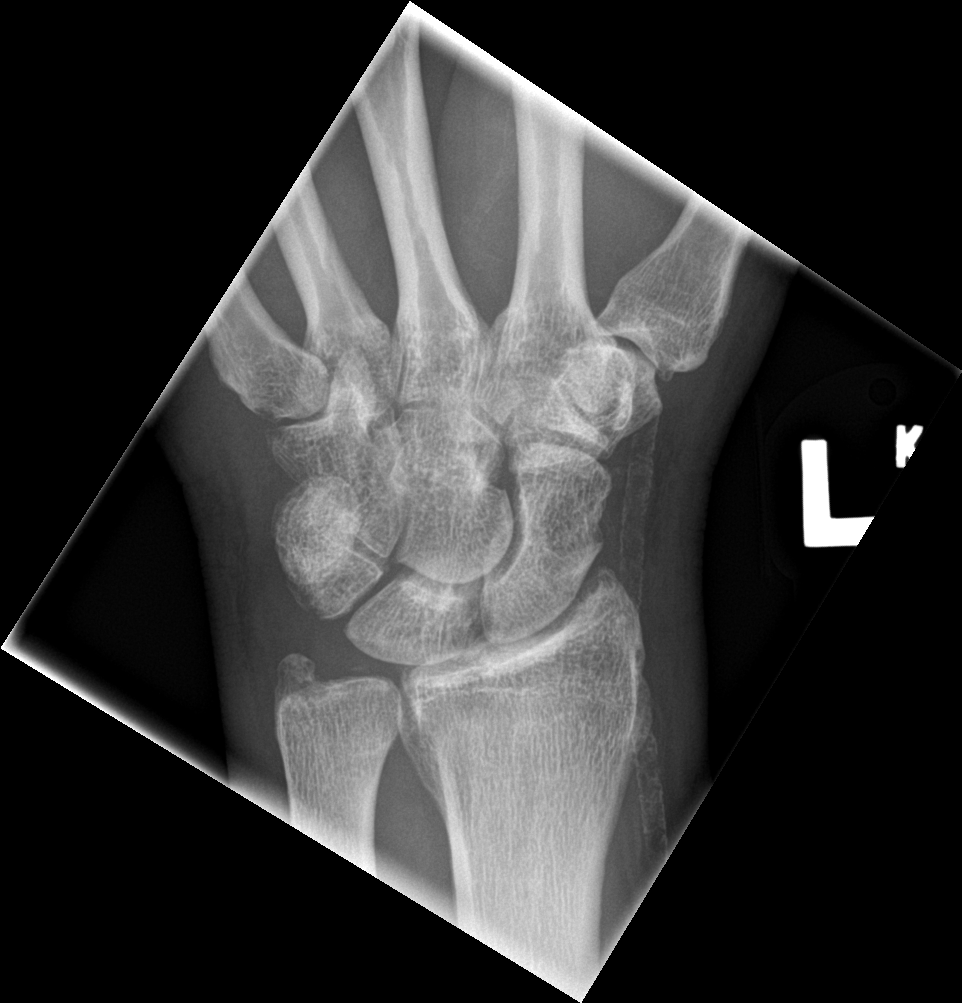

[4 of 4 positions shown; findings below may reference images not displayed]

FINDINGS: Negative for an acute fracture or dislocation. Degenerative changes
involving the radial styloid are unchanged. Mild joint space
narrowing at the radiocarpal joint is unchanged. Alignment of the
wrist is normal. Prominent atherosclerotic calcifications involving
the left radial artery.
IMPRESSION: No acute bone abnormality to the left wrist.

## 2019-03-01 ENCOUNTER — Other Ambulatory Visit: Payer: Self-pay

## 2019-03-01 ENCOUNTER — Ambulatory Visit (HOSPITAL_COMMUNITY)
Admission: RE | Admit: 2019-03-01 | Discharge: 2019-03-01 | Disposition: A | Discharge status: Home / Self Care | Payer: Medicaid Other | Source: Ambulatory Visit | Attending: Pulmonary Disease | Admitting: Pulmonary Disease

## 2019-03-01 DIAGNOSIS — J8410 Pulmonary fibrosis, unspecified: Secondary | ICD-10-CM | POA: Insufficient documentation

## 2019-06-16 ENCOUNTER — Other Ambulatory Visit (INDEPENDENT_AMBULATORY_CARE_PROVIDER_SITE_OTHER): Payer: Self-pay | Admitting: Internal Medicine

## 2019-06-16 ENCOUNTER — Telehealth (INDEPENDENT_AMBULATORY_CARE_PROVIDER_SITE_OTHER): Payer: Self-pay | Admitting: Internal Medicine

## 2019-06-16 ENCOUNTER — Other Ambulatory Visit: Payer: Self-pay

## 2019-06-16 ENCOUNTER — Ambulatory Visit (INDEPENDENT_AMBULATORY_CARE_PROVIDER_SITE_OTHER): Payer: Medicaid Other

## 2019-06-16 MED ORDER — AMLODIPINE BESYLATE 5 MG PO TABS: 5.0000 mg | ORAL_TABLET | Freq: Every day | ORAL | 2 refills | Status: DC

## 2019-06-16 NOTE — Telephone Encounter (Signed)
Done

## 2019-07-13 ENCOUNTER — Ambulatory Visit (INDEPENDENT_AMBULATORY_CARE_PROVIDER_SITE_OTHER): Payer: Medicaid Other | Admitting: Internal Medicine

## 2019-07-13 ENCOUNTER — Other Ambulatory Visit: Payer: Self-pay

## 2019-07-13 ENCOUNTER — Encounter (INDEPENDENT_AMBULATORY_CARE_PROVIDER_SITE_OTHER): Payer: Self-pay | Admitting: Internal Medicine

## 2019-07-13 VITALS — BP 126/80 | HR 89 | Temp 98.9°F | Resp 18 | Ht 70.0 in | Wt 243.0 lb

## 2019-07-13 DIAGNOSIS — I10 Essential (primary) hypertension: Secondary | ICD-10-CM

## 2019-07-13 DIAGNOSIS — I4891 Unspecified atrial fibrillation: Secondary | ICD-10-CM | POA: Diagnosis not present

## 2019-07-13 DIAGNOSIS — E1165 Type 2 diabetes mellitus with hyperglycemia: Secondary | ICD-10-CM | POA: Diagnosis not present

## 2019-07-13 DIAGNOSIS — E559 Vitamin D deficiency, unspecified: Secondary | ICD-10-CM

## 2019-07-13 DIAGNOSIS — Z1159 Encounter for screening for other viral diseases: Secondary | ICD-10-CM

## 2019-07-13 MED ORDER — APIXABAN 5 MG PO TABS: 5.0000 mg | ORAL_TABLET | Freq: Two times a day (BID) | ORAL | 3 refills | Status: DC

## 2019-07-13 MED ORDER — INSULIN DETEMIR 100 UNIT/ML ~~LOC~~ SOLN: 25.0000 [IU] | Freq: Every day | SUBCUTANEOUS | 3 refills | Status: DC

## 2019-07-13 NOTE — Progress Notes (Signed)
Metrics: Intervention Frequency ACO  Documented Smoking Status Yearly  Screened one or more times in 24 months  Cessation Counseling or  Active cessation medication Past 24 months  Past 24 months   Guideline developer: UpToDate (See UpToDate for funding source) Date Released: 2014       Wellness Office Visit  Subjective:  Patient ID: Jeremy Burgess, male    DOB: 12-06-1965  Age: 53 y.o. MRN: [id number]  CC: This man comes in for follow-up of uncontrolled diabetes, hypertension, atrial fibrillation, vitamin D deficiency. HPI  He continues to take insulin as well as oral hypoglycemic agents for his diabetes.  His last hemoglobin A1c was showing poor control at 9.1%.  He continues to do intermittent fasting but only 10 to 11 hours every day which I do not think is adequate.  However, he has managed to reduce the amount of insulin he takes.  He has not managed to lose significant weight yet.  He denies any polyuria or polydipsia. He continues with amlodipine for his hypertension.  He denies any chest pain, dyspnea, palpitations or limb weakness. He continues with vitamin D3 supplementation for vitamin D deficiency. Past Medical History:  Diagnosis Date  . Accidentally struck by falling tree 02/08/2015  . Acute blood loss anemia 02/09/2015  . Acute respiratory failure ([place]) 02/09/2015  . Arthritis   . Asthma   . Atrial flutter ([place])    a. diagnosed in 08/2018 b. s/p DCCV in 09/2018 with return to NSR  . Chronic pain due to trauma   . Diabetes mellitus   . Diabetes mellitus ([place])   . Heart murmur   . History of blood clots    DVT  . Hypertension   . Pneumothorax   . Postlaminectomy syndrome, lumbar region 11/13/2015  . Rib fracture   . Thrombocytopenia ([place]) 02/09/2015  . Traumatic closed displaced fracture of multiple ribs of right side 02/08/2015  . Traumatic hemopneumothorax 02/09/2015  . Upper GI bleeding 05/27/2016   Trivial. EGD unremarkable.      Family History  Problem  Relation Age of Onset  . Diabetes Mother   . COPD Mother   . CAD Father        CABG in his 56's  . Diabetes Sister   . Atrial fibrillation Brother   . Colon cancer Neg Hx     Social History   Social History Narrative       Single.Lives alone.On disability secondary to tree fall accident 2016.   Social History   Tobacco Use  . Smoking status: Never Smoker  . Smokeless tobacco: Never Used  Substance Use Topics  . Alcohol use: No    Comment: history of alcohol abuse but quit in 2008     Current Meds  Medication Sig  . amLODipine (NORVASC) 5 MG tablet Take 1 tablet (5 mg total) by mouth daily.  [name] apixaban (ELIQUIS) 5 MG TABS tablet Take 1 tablet (5 mg total) by mouth 2 (two) times daily.  . Cholecalciferol (VITAMIN D-3) 125 MCG (5000 UT) TABS Take 2 tablets by mouth daily.   . insulin detemir (LEVEMIR) 100 UNIT/ML injection Inject 0.25 mLs (25 Units total) into the skin daily.  [name] JANUMET XR 50-1000 MG TB24 Take 1 tablet by mouth 2 (two) times daily.  . [DISCONTINUED] apixaban (ELIQUIS) 5 MG TABS tablet Take 1 tablet (5 mg total) by mouth 2 (two) times daily.  . [DISCONTINUED] insulin detemir (LEVEMIR) 100 UNIT/ML injection Inject 25 Units into the skin daily.  Objective:   Today's Vitals: BP 126/80 (BP Location: Left Arm, Patient Position: Sitting, Cuff Size: Normal)   Pulse 89   Temp 98.9 F (37.2 C) (Temporal)   Resp 18   Ht '5\' 10"'  (1.778 m)   Wt 243 lb (110.2 kg)   SpO2 96% Comment: with mask.  BMI 34.87 kg/m  Vitals with BMI 07/13/2019 01/26/2019 10/23/2018  Height '5\' 10"'  '5\' 11"'  '5\' 10"'   Weight 243 lbs 240 lbs 242 lbs  BMI 34.87 91.98 02.21  Systolic 798 102 548  Diastolic 80 80 70  Pulse 89 - 65     Physical Exam   He looks systemically well.  Blood pressure is reasonable.  He has not lost any weight.  He is alert and orientated without any focal neurological signs.    Assessment   1. Essential hypertension   2. Atrial fibrillation with RVR ([place])    3. Uncontrolled type 2 diabetes mellitus with hyperglycemia ([place])   4. Vitamin D deficiency disease   5. Encounter for hepatitis C screening test for low risk patient       Tests ordered Orders Placed This Encounter  Procedures  . CMP with eGFR([facility])  . Hemoglobin A1c  . Vitamin D, 25-hydroxy  . CBC  . Microalbumin, urine  . Hep C Antibody     Plan: 1. Blood work is ordered as above. 2. He will continue with amlodipine for his hypertension which appears to be controlled. 3. He will continue with Eliquis for prevention of stroke in the face of atrial fibrillation. 4. He will continue with medications for his diabetes and we will see what the A1c is. 5. He will continue with vitamin D3 supplementation also. 6. Tdap vaccination was given today. 7. Further recommendations will depend on blood results and I will have him follow-up with Sarah in about 3 months time.   Meds ordered this encounter  Medications  . insulin detemir (LEVEMIR) 100 UNIT/ML injection    Sig: Inject 0.25 mLs (25 Units total) into the skin daily.    Dispense:  10 mL    Refill:  3  . apixaban (ELIQUIS) 5 MG TABS tablet    Sig: Take 1 tablet (5 mg total) by mouth 2 (two) times daily.    Dispense:  60 tablet    Refill:  3    Nimish Luther Parody, MD

## 2019-07-13 NOTE — Patient Instructions (Signed)
[facility] Dietary Recommendations for Weight Loss What to Avoid . Avoid added sugars o Often added sugar can be found in processed foods such as many condiments, dry cereals, cakes, cookies, chips, crisps, crackers, candies, sweetened drinks, etc.  o Read labels and AVOID/DECREASE use of foods with the following in their ingredient list: Sugar, fructose, high fructose corn syrup, sucrose, glucose, maltose, dextrose, molasses, cane sugar, brown sugar, any type of syrup, agave nectar, etc.   . Avoid snacking in between meals . Avoid foods made with flour o If you are going to eat food made with flour, choose those made with whole-grains; and, minimize your consumption as much as is tolerable . Avoid processed foods o These foods are generally stocked in the middle of the grocery store. Focus on shopping on the perimeter of the grocery.  What to Include . Vegetables o GREEN LEAFY VEGETABLES: Kale, spinach, mustard greens, collard greens, cabbage, broccoli, etc. o OTHER: Asparagus, cauliflower, eggplant, carrots, peas, Brussel sprouts, tomatoes, bell peppers, zucchini, beets, cucumbers, etc. . Grains, seeds, and legumes o Beans: kidney beans, black eyed peas, garbanzo beans, black beans, pinto beans, etc. o Whole, unrefined grains: brown rice, barley, bulgur, oatmeal, etc. . Healthy fats  o Avoid highly processed fats such as vegetable oil o Examples of healthy fats: avocado, olives, virgin olive oil, dark chocolate (?72% Cocoa), nuts (peanuts, almonds, walnuts, cashews, pecans, etc.) . Low - Moderate Intake of Animal Sources of Protein o Meat sources: chicken, turkey, salmon, tuna. Limit to 4 ounces of meat at one time. o Consider limiting dairy sources, but when choosing dairy focus on: PLAIN Greek yogurt, cottage cheese, high-protein milk . Fruit o Choose berries  When to Eat . Intermittent Fasting: o Choosing not to eat for a specific time period, but DO FOCUS ON HYDRATION  when fasting o Multiple Techniques: - Time Restricted Eating: eat 3 meals in a day, each meal lasting no more than 60 minutes, no snacks between meals - 16-18 hour fast: fast for 16 to 18 hours up to 7 days a week. Often suggested to start with 2-3 nonconsecutive days per week.  . Remember the time you sleep is counted as fasting.  . Examples of eating schedule: Fast from 7:00pm-11:00am. Eat between 11:00am-7:00pm.  - 24-hour fast: fast for 24 hours up to every other day. Often suggested to start with 1 day per week . Remember the time you sleep is counted as fasting . Examples of eating schedule:  o Eating day: eat 2-3 meals on your eating day. If doing 2 meals, each meal should last no more than 90 minutes. If doing 3 meals, each meal should last no more than 60 minutes. Finish last meal by 7:00pm. o Fasting day: Fast until 7:00pm.  o IF YOU FEEL UNWELL FOR ANY REASON/IN ANY WAY WHEN FASTING, STOP FASTING BY EATING A NUTRITIOUS SNACK OR LIGHT MEAL o ALWAYS FOCUS ON HYDRATION DURING FASTS - Acceptable Hydration sources: water, broths, tea/coffee (black tea/coffee is best but using a small amount of whole-fat dairy products in coffee/tea is acceptable).  - Poor Hydration Sources: anything with sugar or artificial sweeteners added to it  These recommendations have been developed for patients that are actively receiving medical care from either Dr. Mikele Sifuentes or Sarah Gray, DNP, NP-C at [facility]. These recommendations are developed for patients with specific medical conditions and are not meant to be distributed or used by others that are not actively receiving care from either provider listed   above at [facility]. It is not appropriate to participate in the above eating plans without proper medical supervision.   Reference: Fung, J. The obesity code. [place]: [place]; 2016.   

## 2019-07-14 LAB — CBC
HCT: 44.7 % (ref 38.5–50.0)
Hemoglobin: 14.9 g/dL (ref 13.2–17.1)
MCH: 28.4 pg (ref 27.0–33.0)
MCHC: 33.3 g/dL (ref 32.0–36.0)
MCV: 85.1 fL (ref 80.0–100.0)
MPV: 12.2 fL (ref 7.5–12.5)
Platelets: 264 10*3/uL (ref 140–400)
RBC: 5.25 10*6/uL (ref 4.20–5.80)
RDW: 12.5 % (ref 11.0–15.0)
WBC: 11 10*3/uL — ABNORMAL HIGH (ref 3.8–10.8)

## 2019-07-14 LAB — COMPLETE METABOLIC PANEL WITH GFR
AG Ratio: 1.5 (calc) (ref 1.0–2.5)
ALT: 20 U/L (ref 9–46)
AST: 20 U/L (ref 10–35)
Albumin: 4.8 g/dL (ref 3.6–5.1)
Alkaline phosphatase (APISO): 55 U/L (ref 35–144)
BUN/Creatinine Ratio: 16 (calc) (ref 6–22)
BUN: 21 mg/dL (ref 7–25)
CO2: 26 mmol/L (ref 20–32)
Calcium: 10.2 mg/dL (ref 8.6–10.3)
Chloride: 102 mmol/L (ref 98–110)
Creat: 1.34 mg/dL — ABNORMAL HIGH (ref 0.70–1.33)
GFR, Est African American: 69 mL/min/{1.73_m2} (ref >=60)
GFR, Est Non African American: 59 mL/min/{1.73_m2} — ABNORMAL LOW (ref >=60)
Globulin: 3.2 g/dL (calc) (ref 1.9–3.7)
Glucose, Bld: 147 mg/dL — ABNORMAL HIGH (ref 65–99)
Potassium: 4.6 mmol/L (ref 3.5–5.3)
Sodium: 142 mmol/L (ref 135–146)
Total Bilirubin: 0.5 mg/dL (ref 0.2–1.2)
Total Protein: 8 g/dL (ref 6.1–8.1)

## 2019-07-14 LAB — VITAMIN D 25 HYDROXY (VIT D DEFICIENCY, FRACTURES): Vit D, 25-Hydroxy: 40 ng/mL (ref 30–100)

## 2019-07-14 LAB — HEMOGLOBIN A1C
Hgb A1c MFr Bld: 8.7 % of total Hgb — ABNORMAL HIGH (ref <5.7)
Mean Plasma Glucose: 203 (calc)
eAG (mmol/L): 11.2 (calc)

## 2019-07-14 LAB — HEPATITIS C ANTIBODY
Hepatitis C Ab: NONREACTIVE
SIGNAL TO CUT-OFF: 0.01 (ref <1.00)

## 2019-07-14 LAB — MICROALBUMIN, URINE: Microalb, Ur: 11 mg/dL

## 2019-09-15 ENCOUNTER — Telehealth (INDEPENDENT_AMBULATORY_CARE_PROVIDER_SITE_OTHER): Payer: Self-pay | Admitting: Internal Medicine

## 2019-09-15 DIAGNOSIS — I10 Essential (primary) hypertension: Secondary | ICD-10-CM

## 2019-09-15 MED ORDER — AMLODIPINE BESYLATE 5 MG PO TABS: 5.0000 mg | ORAL_TABLET | Freq: Every day | ORAL | 0 refills | Status: DC

## 2019-09-15 NOTE — Telephone Encounter (Signed)
Order sent.

## 2019-10-14 ENCOUNTER — Other Ambulatory Visit: Payer: Self-pay

## 2019-10-14 ENCOUNTER — Encounter (INDEPENDENT_AMBULATORY_CARE_PROVIDER_SITE_OTHER): Payer: Self-pay | Admitting: Nurse Practitioner

## 2019-10-14 ENCOUNTER — Ambulatory Visit (INDEPENDENT_AMBULATORY_CARE_PROVIDER_SITE_OTHER): Payer: Medicaid Other | Admitting: Nurse Practitioner

## 2019-10-14 VITALS — BP 145/80 | HR 67 | Temp 98.2°F | Ht 70.0 in | Wt 241.8 lb

## 2019-10-14 DIAGNOSIS — I4891 Unspecified atrial fibrillation: Secondary | ICD-10-CM

## 2019-10-14 DIAGNOSIS — E1165 Type 2 diabetes mellitus with hyperglycemia: Secondary | ICD-10-CM | POA: Diagnosis not present

## 2019-10-14 DIAGNOSIS — I10 Essential (primary) hypertension: Secondary | ICD-10-CM

## 2019-10-14 DIAGNOSIS — E559 Vitamin D deficiency, unspecified: Secondary | ICD-10-CM

## 2019-10-14 MED ORDER — LOSARTAN POTASSIUM 25 MG PO TABS: 25.0000 mg | ORAL_TABLET | Freq: Every day | ORAL | 1 refills | Status: DC

## 2019-10-14 MED ORDER — APIXABAN 5 MG PO TABS: 5.0000 mg | ORAL_TABLET | Freq: Two times a day (BID) | ORAL | 3 refills | Status: DC
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
## 2019-10-15 LAB — COMPLETE METABOLIC PANEL WITH GFR
AG Ratio: 1.6 (calc) (ref 1.0–2.5)
ALT: 19 U/L (ref 9–46)
AST: 21 U/L (ref 10–35)
Albumin: 4.4 g/dL (ref 3.6–5.1)
Alkaline phosphatase (APISO): 58 U/L (ref 35–144)
BUN: 16 mg/dL (ref 7–25)
CO2: 28 mmol/L (ref 20–32)
Calcium: 9.7 mg/dL (ref 8.6–10.3)
Chloride: 101 mmol/L (ref 98–110)
Creat: 1.06 mg/dL (ref 0.70–1.33)
GFR, Est African American: 90 mL/min/{1.73_m2} (ref >=60)
GFR, Est Non African American: 78 mL/min/{1.73_m2} (ref >=60)
Globulin: 2.7 g/dL (calc) (ref 1.9–3.7)
Glucose, Bld: 225 mg/dL — ABNORMAL HIGH (ref 65–99)
Potassium: 4.2 mmol/L (ref 3.5–5.3)
Sodium: 138 mmol/L (ref 135–146)
Total Bilirubin: 0.6 mg/dL (ref 0.2–1.2)
Total Protein: 7.1 g/dL (ref 6.1–8.1)

## 2019-10-15 LAB — HEMOGLOBIN A1C
Hgb A1c MFr Bld: 8.5 % of total Hgb — ABNORMAL HIGH (ref <5.7)
Mean Plasma Glucose: 197 (calc)
eAG (mmol/L): 10.9 (calc)

## 2019-10-15 LAB — VITAMIN D 25 HYDROXY (VIT D DEFICIENCY, FRACTURES): Vit D, 25-Hydroxy: 43 ng/mL (ref 30–100)

## 2019-10-27 ENCOUNTER — Ambulatory Visit (INDEPENDENT_AMBULATORY_CARE_PROVIDER_SITE_OTHER): Payer: Medicaid Other | Admitting: Nurse Practitioner

## 2019-10-27 ENCOUNTER — Encounter (INDEPENDENT_AMBULATORY_CARE_PROVIDER_SITE_OTHER): Payer: Self-pay | Admitting: Nurse Practitioner

## 2019-10-27 ENCOUNTER — Other Ambulatory Visit: Payer: Self-pay

## 2019-10-27 VITALS — BP 146/80 | Temp 98.6°F | Ht 70.0 in | Wt 238.6 lb

## 2019-10-27 DIAGNOSIS — E1165 Type 2 diabetes mellitus with hyperglycemia: Secondary | ICD-10-CM | POA: Diagnosis not present

## 2019-10-27 DIAGNOSIS — I10 Essential (primary) hypertension: Secondary | ICD-10-CM | POA: Diagnosis not present

## 2019-10-27 MED ORDER — AMLODIPINE BESYLATE 5 MG PO TABS: 5.0000 mg | ORAL_TABLET | Freq: Every day | ORAL | 0 refills | Status: DC
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
## 2019-10-28 LAB — COMPLETE METABOLIC PANEL WITH GFR
AG Ratio: 1.7 (calc) (ref 1.0–2.5)
ALT: 15 U/L (ref 9–46)
AST: 17 U/L (ref 10–35)
Albumin: 4.4 g/dL (ref 3.6–5.1)
Alkaline phosphatase (APISO): 56 U/L (ref 35–144)
BUN: 15 mg/dL (ref 7–25)
CO2: 24 mmol/L (ref 20–32)
Calcium: 9.2 mg/dL (ref 8.6–10.3)
Chloride: 106 mmol/L (ref 98–110)
Creat: 1.02 mg/dL (ref 0.70–1.33)
GFR, Est African American: 95 mL/min/{1.73_m2} (ref >=60)
GFR, Est Non African American: 82 mL/min/{1.73_m2} (ref >=60)
Globulin: 2.6 g/dL (calc) (ref 1.9–3.7)
Glucose, Bld: 151 mg/dL — ABNORMAL HIGH (ref 65–99)
Potassium: 4.7 mmol/L (ref 3.5–5.3)
Sodium: 140 mmol/L (ref 135–146)
Total Bilirubin: 0.5 mg/dL (ref 0.2–1.2)
Total Protein: 7 g/dL (ref 6.1–8.1)

## 2019-11-25 ENCOUNTER — Other Ambulatory Visit: Payer: Self-pay

## 2019-11-25 ENCOUNTER — Encounter (INDEPENDENT_AMBULATORY_CARE_PROVIDER_SITE_OTHER): Payer: Self-pay | Admitting: Nurse Practitioner

## 2019-11-25 ENCOUNTER — Ambulatory Visit (INDEPENDENT_AMBULATORY_CARE_PROVIDER_SITE_OTHER): Payer: Medicaid Other | Admitting: Nurse Practitioner

## 2019-11-25 VITALS — BP 150/75 | HR 62 | Temp 98.6°F | Ht 70.0 in | Wt 237.6 lb

## 2019-11-25 DIAGNOSIS — I483 Typical atrial flutter: Secondary | ICD-10-CM

## 2019-11-25 DIAGNOSIS — E1165 Type 2 diabetes mellitus with hyperglycemia: Secondary | ICD-10-CM

## 2019-11-25 DIAGNOSIS — I10 Essential (primary) hypertension: Secondary | ICD-10-CM | POA: Diagnosis not present

## 2019-11-25 DIAGNOSIS — I4891 Unspecified atrial fibrillation: Secondary | ICD-10-CM

## 2019-11-25 MED ORDER — LOSARTAN POTASSIUM 25 MG PO TABS: 25.0000 mg | ORAL_TABLET | Freq: Every day | ORAL | 1 refills | Status: DC
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
## 2019-12-09 ENCOUNTER — Ambulatory Visit (INDEPENDENT_AMBULATORY_CARE_PROVIDER_SITE_OTHER): Payer: Medicaid Other | Admitting: Nurse Practitioner

## 2019-12-09 ENCOUNTER — Encounter (INDEPENDENT_AMBULATORY_CARE_PROVIDER_SITE_OTHER): Payer: Self-pay | Admitting: Nurse Practitioner

## 2019-12-09 ENCOUNTER — Other Ambulatory Visit: Payer: Self-pay

## 2019-12-09 VITALS — BP 160/80 | HR 66 | Temp 98.0°F | Ht 70.0 in | Wt 233.8 lb

## 2019-12-09 DIAGNOSIS — I10 Essential (primary) hypertension: Secondary | ICD-10-CM | POA: Diagnosis not present

## 2019-12-09 DIAGNOSIS — E1165 Type 2 diabetes mellitus with hyperglycemia: Secondary | ICD-10-CM | POA: Diagnosis not present

## 2019-12-09 LAB — COMPLETE METABOLIC PANEL WITH GFR
AG Ratio: 1.7 (calc) (ref 1.0–2.5)
ALT: 15 U/L (ref 9–46)
AST: 21 U/L (ref 10–35)
Albumin: 4.2 g/dL (ref 3.6–5.1)
Alkaline phosphatase (APISO): 52 U/L (ref 35–144)
BUN: 20 mg/dL (ref 7–25)
CO2: 22 mmol/L (ref 20–32)
Calcium: 8.9 mg/dL (ref 8.6–10.3)
Chloride: 107 mmol/L (ref 98–110)
Creat: 1.14 mg/dL (ref 0.70–1.33)
GFR, Est African American: 83 mL/min/{1.73_m2} (ref >=60)
GFR, Est Non African American: 71 mL/min/{1.73_m2} (ref >=60)
Globulin: 2.5 g/dL (calc) (ref 1.9–3.7)
Glucose, Bld: 142 mg/dL — ABNORMAL HIGH (ref 65–99)
Potassium: 4.2 mmol/L (ref 3.5–5.3)
Sodium: 140 mmol/L (ref 135–146)
Total Bilirubin: 0.5 mg/dL (ref 0.2–1.2)
Total Protein: 6.7 g/dL (ref 6.1–8.1)

## 2019-12-09 MED ORDER — AMLODIPINE BESYLATE 10 MG PO TABS: 10.0000 mg | ORAL_TABLET | Freq: Every day | ORAL | 0 refills | Status: DC

## 2019-12-09 MED ORDER — INSULIN DETEMIR 100 UNIT/ML ~~LOC~~ SOLN: 25.0000 [IU] | Freq: Every day | SUBCUTANEOUS | 3 refills | Status: DC

## 2019-12-09 NOTE — Patient Instructions (Signed)
Increase your dose of amlodipine from 5 mg by mouth daily to 10 mg by mouth daily.  I have sent a new prescription to your pharmacy for this.  If you have any worrisome symptoms such as sudden and severe headache, chest pain, difficulty seeing, shortness of breath, fatigue, weakness on one side of body or the other, sensation changes, difficulty speaking, or difficulty swallowing please call 911.

## 2019-12-09 NOTE — Progress Notes (Signed)
Subjective:  Patient ID: Jeremy Burgess, male    DOB: May 06, 1966  Age: 54 y.o. MRN: [id number]  CC:  Chief Complaint  Patient presents with  . Hypertension      HPI  This patient comes in today for the above.  As last office visit we had started him on losartan 25 mg daily in addition to his 5 mg of amlodipine.  Today he tells me he has made this change he is feeling well on these medications.  He denies any chest pain, shortness of breath, dizziness, or cough.  He does mention to me that he would like a refill on his Levemir today.  He tells me when checking his blood sugar at home has been running in the low 100s fasting.  Last A1c was 8.5.  This was collected approximately 6 weeks ago.   Past Medical History:  Diagnosis Date  . Accidentally struck by falling tree 02/08/2015  . Acute blood loss anemia 02/09/2015  . Acute respiratory failure ([place]) 02/09/2015  . Acute respiratory failure ([place]) 02/09/2015  . Ankle fracture, right 02/09/2015  . Arthritis   . Asthma   . Atrial flutter ([place])    a. diagnosed in 08/2018 b. s/p DCCV in 09/2018 with return to NSR  . Chronic pain due to trauma   . Closed fracture of right ulna 02/08/2015  . Closed right pilon fracture 02/23/2015  . Diabetes mellitus   . Diabetes mellitus ([place])   . Femur fracture, right ([place]) 02/08/2015  . Heart murmur   . History of blood clots    DVT  . Hypertension   . Injury of hand, extensor tendon, right, sequela 09/01/2018  . Pneumothorax   . Postlaminectomy syndrome, lumbar region 11/13/2015  . Rib fracture   . Thrombocytopenia ([place]) 02/09/2015  . Traumatic closed displaced fracture of multiple ribs of right side 02/08/2015  . Traumatic hemopneumothorax 02/09/2015  . Upper GI bleed 05/26/2016  . Upper GI bleeding 05/27/2016   Trivial. EGD unremarkable.      Family History  Problem Relation Age of Onset  . Diabetes Mother   . COPD Mother   . CAD Father        CABG in his 42's  . Diabetes Sister   .  Atrial fibrillation Brother   . Colon cancer Neg Hx     Social History   Social History Narrative       Single.Lives alone.On disability secondary to tree fall accident 2016.   Social History   Tobacco Use  . Smoking status: Never Smoker  . Smokeless tobacco: Never Used  Substance Use Topics  . Alcohol use: No    Comment: history of alcohol abuse but quit in 2008      Current Meds  Medication Sig  . amLODipine (NORVASC) 10 MG tablet Take 1 tablet (10 mg total) by mouth daily.  [name] apixaban (ELIQUIS) 5 MG TABS tablet Take 1 tablet (5 mg total) by mouth 2 (two) times daily.  . Cholecalciferol (VITAMIN D-3) 125 MCG (5000 UT) TABS Take 2 tablets by mouth daily.   . insulin detemir (LEVEMIR) 100 UNIT/ML injection Inject 0.25 mLs (25 Units total) into the skin daily.  [name] JANUMET XR 50-1000 MG TB24 Take 1 tablet by mouth 2 (two) times daily.  [name] losartan (COZAAR) 25 MG tablet Take 1 tablet (25 mg total) by mouth daily.  . [DISCONTINUED] amLODipine (NORVASC) 5 MG tablet Take 1 tablet (5 mg total) by mouth daily.  . [  DISCONTINUED] insulin detemir (LEVEMIR) 100 UNIT/ML injection Inject 0.25 mLs (25 Units total) into the skin daily.    ROS:  Negative unless otherwise stated in HPI   Objective:   Today's Vitals: BP (!) 160/80   Pulse 66   Temp 98 F (36.7 C)   Ht '5\' 10"'  (1.778 m)   Wt 233 lb 12.8 oz (106.1 kg)   SpO2 97%   BMI 33.55 kg/m  Vitals with BMI 12/09/2019 11/25/2019 11/25/2019  Height '5\' 10"'  - '5\' 10"'   Weight 233 lbs 13 oz - 237 lbs 10 oz  BMI 16.10 - 96.04  Systolic 540 - 981  Diastolic 80 - 75  Pulse 66 62 121     Physical Exam Vitals reviewed.  Constitutional:      Appearance: Normal appearance.  HENT:     Head: Normocephalic and atraumatic.  Cardiovascular:     Rate and Rhythm: Normal rate and regular rhythm.  Pulmonary:     Effort: Pulmonary effort is normal.     Breath sounds: Normal breath sounds.  Musculoskeletal:     Cervical back: Neck supple.    Skin:    General: Skin is warm and dry.  Neurological:     Mental Status: He is alert and oriented to person, place, and time.  Psychiatric:        Mood and Affect: Mood normal.        Behavior: Behavior normal.        Thought Content: Thought content normal.        Judgment: Judgment normal.          Assessment and Plan   1. Essential hypertension   2. Uncontrolled type 2 diabetes mellitus with hyperglycemia ([place])      Plan: 1.  Blood pressure is still elevated today.  He tells me he does have chronic pain related to a traumatic accident that occurred years ago.  He thinks that this could be affecting his pain.  He does take an Aleve as needed which will help with the pain.  He tries to avoid Tylenol because he had damage to his liver during this accident in the past.  I did explain to him that via blood work liver appears to be functioning well at this time.  However he would be more comfortable taking blood pressure medication as opposed to pain medication.  We did briefly discuss trying gabapentin to help control his pain as he believes his pain is related to some nerve damage from the accident.  For now, he would like to adjust his blood pressure medication as opposed starting gabapentin.  I will increase his amlodipine from 5 mg to 10 mg by mouth daily.  We will check a metabolic panel today as he did start losartan at his last office visit.  He will come back in approximately 3 to 4 weeks for blood pressure check.  We did discuss red flag symptoms of elevated blood pressure and that he should go to the emergency room if he experiences any of these.  He tells me he understands.  2.  Fasting blood sugars seem to be at goal as reported by patient.  I will refill his Levemir today.     Tests ordered Orders Placed This Encounter  Procedures  . CMP with eGFR([facility])      Meds ordered this encounter  Medications  . insulin detemir (LEVEMIR) 100 UNIT/ML injection    Sig:  Inject 0.25 mLs (25 Units total) into the skin  daily.    Dispense:  10 mL    Refill:  3    Order Specific Question:   Supervising Provider    Answer:   Anastasio Champion, NIMISH C [[id number]]  . amLODipine (NORVASC) 10 MG tablet    Sig: Take 1 tablet (10 mg total) by mouth daily.    Dispense:  90 tablet    Refill:  0    Order Specific Question:   Supervising Provider    Answer:   Doree Albee [[id number]]    Patient to follow-up in 3 to 4 weeks.  Ailene Ards, NP

## 2019-12-21 NOTE — Progress Notes (Signed)
Cardiology Office Note    Date:  12/28/2019   ID:  [name], DOB 25-Jul-1966, MRN [id number]  PCP:  Jeremy Albee, MD  Cardiologist: Jeremy Dolly, MD EPS: None  No chief complaint on file.   History of Present Illness:  Jeremy Burgess is a 54 y.o. male with history of atrial flutter on Eliquis status post cardioversion 10/06/2018, diltiazem stopped because of low heart rates also has hypertension managed by PCP.  Last saw Dr. Harl Burgess via telemedicine 01/26/2019 at which time he was doing well and recommended EKG next office visit.  Patient comes in for yearly f/u. BP running high and PCP started losartan and amlodipine. Tries to watch salt closely. In chronic pain.Denies chest pain, dyspnea, dizziness or presyncope. Walks a little but limited with back pain.   Past Medical History:  Diagnosis Date  . Accidentally struck by falling tree 02/08/2015  . Acute Burgess loss anemia 02/09/2015  . Acute respiratory failure (Jeremy Burgess) 02/09/2015  . Acute respiratory failure ([place]) 02/09/2015  . Ankle fracture, right 02/09/2015  . Arthritis   . Asthma   . Atrial flutter (Jeremy Burgess)    a. diagnosed in 08/2018 b. s/p DCCV in 09/2018 with return to NSR  . Chronic pain due to trauma   . Closed fracture of right ulna 02/08/2015  . Closed right pilon fracture 02/23/2015  . Diabetes mellitus   . Diabetes mellitus ([place])   . Femur fracture, right ([place]) 02/08/2015  . Heart murmur   . History of Burgess clots    DVT  . Hypertension   . Injury of hand, extensor tendon, right, sequela 09/01/2018  . Pneumothorax   . Postlaminectomy syndrome, lumbar region 11/13/2015  . Rib fracture   . Thrombocytopenia ([place]) 02/09/2015  . Traumatic closed displaced fracture of multiple ribs of right side 02/08/2015  . Traumatic hemopneumothorax 02/09/2015  . Upper GI bleed 05/26/2016  . Upper GI bleeding 05/27/2016   Trivial. EGD unremarkable.    Past Surgical History:  Procedure Laterality Date  . BACK SURGERY    .  CARDIOVERSION N/A 10/06/2018   Procedure: CARDIOVERSION;  Surgeon: [name], MD;  Location: [facility];  Service: Cardiovascular;  Laterality: N/A;  . CHOLECYSTECTOMY N/A 10/30/2016   Procedure: LAPAROSCOPIC CHOLECYSTECTOMY;  Surgeon: [name], MD;  Location: [facility];  Service: General;  Laterality: N/A;  . COLONOSCOPY N/A 07/25/2016   Dr. [name]: 9 mm tubular adenoma. 5 year surveillance   . ESOPHAGOGASTRODUODENOSCOPY  2009   EGD completed by Dr. [name] due to hematemesis. Findings of normal esophagus, tiny antral erosions, normal D1, D2  . ESOPHAGOGASTRODUODENOSCOPY N/A 05/27/2016   Dr. [name]: normal   . EXTERNAL FIXATION LEG Right 02/09/2015   Procedure: EXTERNAL FIXATION LEG;  Surgeon: [name], MD;  Location: [facility];  Service: Orthopedics;  Laterality: Right;  . EXTERNAL FIXATION REMOVAL Right 02/14/2015   Procedure: REMOVAL EXTERNAL FIXATION LEG;  Surgeon: [name], MD;  Location: [facility];  Service: Orthopedics;  Laterality: Right;  . FEMUR IM NAIL Right 02/09/2015   Procedure: INTRAMEDULLARY (IM) RETROGRADE FEMORAL NAILING;  Surgeon: [name], MD;  Location: [facility];  Service: Orthopedics;  Laterality: Right;  . NOSE SURGERY    . ORIF ANKLE FRACTURE Right 02/14/2015   Procedure: OPEN REDUCTION INTERNAL FIXATION (ORIF) PILON FRACTURE;  Surgeon: [name], MD;  Location: [facility];  Service: Orthopedics;  Laterality: Right;  . ORIF ULNAR FRACTURE Right 02/09/2015   Procedure: OPEN REDUCTION INTERNAL FIXATION (  ORIF) ULNAR FRACTURE;  Surgeon: [name], MD;  Location: [facility];  Service: Orthopedics;  Laterality: Right;  . POLYPECTOMY  07/25/2016   Procedure: POLYPECTOMY;  Surgeon: [name], MD;  [facility];  Service: Endoscopy;;  colon    Current Medications: Current Meds  Medication Sig  . amLODipine (NORVASC) 10 MG tablet Take 1 tablet (10 mg total) by mouth daily.  [name] apixaban (ELIQUIS) 5 MG TABS tablet Take 1 tablet (5 mg total) by mouth  2 (two) times daily.  . Cholecalciferol (VITAMIN D-3) 125 MCG (5000 UT) TABS Take 2 tablets by mouth daily.   . insulin detemir (LEVEMIR) 100 UNIT/ML injection Inject 0.25 mLs (25 Units total) into the skin daily.  [name] JANUMET XR 50-1000 MG TB24 Take 1 tablet by mouth 2 (two) times daily.  [name] losartan (COZAAR) 25 MG tablet Take 1 tablet (25 mg total) by mouth daily.     Allergies:   Bee venom and Lisinopril   Social History   Socioeconomic History  . Marital status: Single    Spouse name: Not on file  . Number of children: Not on file  . Years of education: Not on file  . Highest education level: Not on file  Occupational History  . Not on file  Tobacco Use  . Smoking status: Never Smoker  . Smokeless tobacco: Never Used  Substance and Sexual Activity  . Alcohol use: No    Comment: history of alcohol abuse but quit in 2008   . Drug use: No  . Sexual activity: Yes  Other Topics Concern  . Not on file  Social History Narrative       Single.Lives alone.On disability secondary to tree fall accident 2016.   Social Determinants of Health   Financial Resource Strain:   . Difficulty of Paying Living Expenses:   Food Insecurity:   . Worried About Charity fundraiser in the Last Year:   . Arboriculturist in the Last Year:   Transportation Needs:   . Film/video editor (Medical):   [name] Lack of Transportation (Non-Medical):   Physical Activity:   . Days of Exercise per Week:   . Minutes of Exercise per Session:   Stress:   . Feeling of Stress :   Social Connections:   . Frequency of Communication with Friends and Family:   . Frequency of Social Gatherings with Friends and Family:   . Attends Religious Services:   . Active Member of Clubs or Organizations:   . Attends Archivist Meetings:   [name] Marital Status:      Family History:  The patient's  family history includes Atrial fibrillation in his brother; CAD in his father; COPD in his mother; Diabetes in his mother  and sister.   ROS:   Please see the history of present illness.    ROS All other systems reviewed and are negative.   PHYSICAL EXAM:   VS:  BP 136/70   Pulse 64   Ht 5\' 10"  (1.778 m)   Wt 237 lb (107.5 kg)   SpO2 98%   BMI 34.01 kg/m   Physical Exam  GEN: Obese, in no acute distress  Neck: no JVD, carotid bruits, or masses Cardiac:RRR; positive S4, 1/6 to 2/6 systolic murmur in the left sternal border Respiratory:  clear to auscultation bilaterally, normal work of breathing GI: soft, nontender, nondistended, + BS Ext: Trace of edema without cyanosis, clubbing, Good distal pulses bilaterally MS:  no deformity or atrophy  Skin: warm and dry, no rash Neuro:  Alert and Oriented x 3,  Psych: euthymic mood, full affect  Wt Readings from Last 3 Encounters:  12/28/19 237 lb (107.5 kg)  12/09/19 233 lb 12.8 oz (106.1 kg)  11/25/19 237 lb 9.6 oz (107.8 kg)      Studies/Labs Reviewed:   EKG:  EKG is  ordered today.  The ekg ordered today demonstrates normal sinus rhythm, normal EKG  Recent Labs: 07/13/2019: Hemoglobin 14.9; Platelets 264 12/09/2019: ALT 15; BUN 20; Creat 1.14; Potassium 4.2; Sodium 140   Lipid Panel    Component Value Date/Time   CHOL 120 09/01/2018 0738   TRIG 58 09/01/2018 0738   HDL 35 (L) 09/01/2018 0738   CHOLHDL 3.4 09/01/2018 0738   VLDL 12 09/01/2018 0738   LDLCALC 73 09/01/2018 0738    Additional studies/ records that were reviewed today include:   Echocardiogram: 08/2018   Study Conclusions - Left ventricle: The cavity size was normal. Wall thickness was   normal. Systolic function was normal. The estimated ejection   fraction was in the range of 60% to 65%. Wall motion was normal;   there were no regional wall motion abnormalities. The study is   not technically sufficient to allow evaluation of LV diastolic   function. - Aortic valve: Valve area (VTI): 2.83 cm^2. Valve area (Vmax):   2.61 cm^2. - Left atrium: The atrium was moderately to  severely dilated. - Right atrium: The atrium was mildly dilated. - Atrial septum: No defect or patent foramen ovale was identified.      ASSESSMENT:    1. Typical atrial flutter ([place])   2. Essential hypertension   3. Uncontrolled type 2 diabetes mellitus with hyperglycemia ([facility])      PLAN:  In order of problems listed above:  Atrial flutter on Eliquis status post cardioversion 09/2018, no AV nodal blocking agents because of bradycardia after conversion-creatinine 1.14 12/09/2019 and hemoglobin stable in December.  No changes.  Follow-up with Dr. Harl Burgess in 6 months.  Essential hypertension Burgess pressure up recently and started on amlodipine and losartan by PCP.  Burgess pressure well controlled today.  Continue 2 g sodium diet.  Chronic pain could be contributing to Burgess pressure as well.  Diabetes mellitus on insulin hemoglobin A1c 8.5 10/2019 PCP-should be on a statin but patient declines-says he used to be a heavy drinker and has had liver problems in the past.  Medication Adjustments/Labs and Tests Ordered: Current medicines are reviewed at length with the patient today.  Concerns regarding medicines are outlined above.  Medication changes, Labs and Tests ordered today are listed in the Patient Instructions below. Patient Instructions  Medication Instructions:  Your physician recommends that you continue on your current medications as directed. Please refer to the Current Medication list given to you today.  *If you need a refill on your cardiac medications before your next appointment, please call your pharmacy*   Lab Work: NONE   If you have labs (Burgess work) drawn today and your tests are completely normal, you will receive your results only by: [name] MyChart Message (if you have MyChart) OR . A paper copy in the mail If you have any lab test that is abnormal or we need to change your treatment, we will call you to review the results.   Testing/Procedures: NONE    Follow-Up: At [facility], you and your health needs are our priority.  As part of our continuing mission  to provide you with exceptional heart care, we have created designated Provider Care Teams.  These Care Teams include your primary Cardiologist (physician) and Advanced Practice Providers (APPs -  Physician Assistants and Nurse Practitioners) who all work together to provide you with the care you need, when you need it.  We recommend signing up for the patient portal called "MyChart".  Sign up information is provided on this After Visit Summary.  MyChart is used to connect with patients for Virtual Visits (Telemedicine).  Patients are able to view lab/test results, encounter notes, upcoming appointments, etc.  Non-urgent messages can be sent to your provider as well.   To learn more about what you can do with MyChart, go to [email or web address].    Your next appointment:   6 month(s)  The format for your next appointment:   In Person  Provider:   Carlyle Dolly, MD   Other Instructions Thank you for choosing [facility]!       Sumner Boast, PA-C  12/28/2019 2:00 PM    [facility] [address], [place], [place]  [address] Phone: [phone]; Fax: [phone]

## 2019-12-28 ENCOUNTER — Other Ambulatory Visit: Payer: Self-pay

## 2019-12-28 ENCOUNTER — Encounter: Payer: Self-pay | Admitting: Physician Assistant

## 2019-12-28 ENCOUNTER — Ambulatory Visit: Payer: Medicaid Other | Admitting: Physician Assistant

## 2019-12-28 VITALS — BP 136/70 | HR 64 | Ht 70.0 in | Wt 237.0 lb

## 2019-12-28 DIAGNOSIS — E1165 Type 2 diabetes mellitus with hyperglycemia: Secondary | ICD-10-CM | POA: Diagnosis not present

## 2019-12-28 DIAGNOSIS — I10 Essential (primary) hypertension: Secondary | ICD-10-CM | POA: Diagnosis not present

## 2019-12-28 DIAGNOSIS — I483 Typical atrial flutter: Secondary | ICD-10-CM | POA: Diagnosis not present

## 2019-12-28 NOTE — Patient Instructions (Signed)
Medication Instructions:  Your physician recommends that you continue on your current medications as directed. Please refer to the Current Medication list given to you today.  *If you need a refill on your cardiac medications before your next appointment, please call your pharmacy*   Lab Work: NONE   If you have labs (blood work) drawn today and your tests are completely normal, you will receive your results only by: . MyChart Message (if you have MyChart) OR . A paper copy in the mail If you have any lab test that is abnormal or we need to change your treatment, we will call you to review the results.   Testing/Procedures: NONE    Follow-Up: At [facility], you and your health needs are our priority.  As part of our continuing mission to provide you with exceptional heart care, we have created designated Provider Care Teams.  These Care Teams include your primary Cardiologist (physician) and Advanced Practice Providers (APPs -  Physician Assistants and Nurse Practitioners) who all work together to provide you with the care you need, when you need it.  We recommend signing up for the patient portal called "MyChart".  Sign up information is provided on this After Visit Summary.  MyChart is used to connect with patients for Virtual Visits (Telemedicine).  Patients are able to view lab/test results, encounter notes, upcoming appointments, etc.  Non-urgent messages can be sent to your provider as well.   To learn more about what you can do with MyChart, go to [email or web address].    Your next appointment:   6 month(s)  The format for your next appointment:   In Person  Provider:   Jonathan [place], MD   Other Instructions Thank you for choosing [facility]!    

## 2020-01-04 ENCOUNTER — Other Ambulatory Visit: Payer: Self-pay

## 2020-01-04 ENCOUNTER — Encounter (INDEPENDENT_AMBULATORY_CARE_PROVIDER_SITE_OTHER): Payer: Self-pay | Admitting: Nurse Practitioner

## 2020-01-04 ENCOUNTER — Ambulatory Visit (INDEPENDENT_AMBULATORY_CARE_PROVIDER_SITE_OTHER): Payer: Medicaid Other | Admitting: Nurse Practitioner

## 2020-01-04 VITALS — BP 145/65 | HR 60 | Temp 97.9°F | Ht 70.0 in | Wt 237.4 lb

## 2020-01-04 DIAGNOSIS — I10 Essential (primary) hypertension: Secondary | ICD-10-CM

## 2020-01-04 DIAGNOSIS — I4891 Unspecified atrial fibrillation: Secondary | ICD-10-CM | POA: Diagnosis not present

## 2020-01-04 DIAGNOSIS — E1165 Type 2 diabetes mellitus with hyperglycemia: Secondary | ICD-10-CM

## 2020-01-04 DIAGNOSIS — E559 Vitamin D deficiency, unspecified: Secondary | ICD-10-CM

## 2020-01-04 MED ORDER — JANUMET XR 50-1000 MG PO TB24: 2.0000 | ORAL_TABLET | Freq: Every day | ORAL | 0 refills | Status: DC

## 2020-01-04 MED ORDER — INSULIN DETEMIR 100 UNIT/ML ~~LOC~~ SOLN: 28.0000 [IU] | Freq: Every day | SUBCUTANEOUS | 3 refills | Status: DC

## 2020-01-04 NOTE — Progress Notes (Signed)
Subjective:  Patient ID: Jeremy Burgess, male    DOB: 01-Jan-1966  Age: 54 y.o. MRN: [id number]  CC:  Chief Complaint  Patient presents with  . Hypertension  . Diabetes  . Medication Refill    Janumet XR  . Follow-up  . Other    Atrial fibrillation, vitamin D deficiency      HPI  This patient comes in today for follow-up for the above.  Hypertension: He has a history of hypertension and continues on losartan 25 mg and amlodipine 10 mg daily.  We increase his amlodipine to 10 mg at his last office visit.  He tells me is tolerating these medications well.  Type 2 diabetes: He does have a history of type 2 diabetes last A1c was collected in March 2021 and showed that his A1c was 8.5.  He is on losartan, he is not on a statin.  We have discussed this in the past and he wants to hold off on taking a statin.  Last foot exam was completed in March 2021.  He reports that his fasting blood sugars at home are in the 120s.  Atrial fibrillation: He has a history of atrial fibrillation and was a bit overdue to see his cardiologist, but since his last office visit he has followed up with them.  From their point of view everything appears to be stable, and he is encouraged to follow-up with him again in 6 months.  He continues on Eliquis.  Vitamin D deficiency: He continues on vitamin D3 supplementation, he takes 10,000 IUs daily.  Last serum level was collected in March and it was 22.   Past Medical History:  Diagnosis Date  . Accidentally struck by falling tree 02/08/2015  . Acute blood loss anemia 02/09/2015  . Acute respiratory failure ([place]) 02/09/2015  . Acute respiratory failure ([place]) 02/09/2015  . Ankle fracture, right 02/09/2015  . Arthritis   . Asthma   . Atrial flutter ([place])    a. diagnosed in 08/2018 b. s/p DCCV in 09/2018 with return to NSR  . Chronic pain due to trauma   . Closed fracture of right ulna 02/08/2015  . Closed right pilon fracture 02/23/2015  . Diabetes mellitus    . Diabetes mellitus ([place])   . Femur fracture, right ([place]) 02/08/2015  . Heart murmur   . History of blood clots    DVT  . Hypertension   . Injury of hand, extensor tendon, right, sequela 09/01/2018  . Pneumothorax   . Postlaminectomy syndrome, lumbar region 11/13/2015  . Rib fracture   . Thrombocytopenia ([place]) 02/09/2015  . Traumatic closed displaced fracture of multiple ribs of right side 02/08/2015  . Traumatic hemopneumothorax 02/09/2015  . Upper GI bleed 05/26/2016  . Upper GI bleeding 05/27/2016   Trivial. EGD unremarkable.      Family History  Problem Relation Age of Onset  . Diabetes Mother   . COPD Mother   . CAD Father        CABG in his 39's  . Diabetes Sister   . Atrial fibrillation Brother   . Colon cancer Neg Hx     Social History   Social History Narrative       Single.Lives alone.On disability secondary to tree fall accident 2016.   Social History   Tobacco Use  . Smoking status: Never Smoker  . Smokeless tobacco: Never Used  Substance Use Topics  . Alcohol use: No    Comment: history of alcohol abuse  but quit in 2008      Current Meds  Medication Sig  . amLODipine (NORVASC) 10 MG tablet Take 1 tablet (10 mg total) by mouth daily.  [name] apixaban (ELIQUIS) 5 MG TABS tablet Take 1 tablet (5 mg total) by mouth 2 (two) times daily.  . Cholecalciferol (VITAMIN D-3) 125 MCG (5000 UT) TABS Take 2 tablets by mouth daily.   . insulin detemir (LEVEMIR) 100 UNIT/ML injection Inject 0.28 mLs (28 Units total) into the skin daily.  [name] JANUMET XR 50-1000 MG TB24 Take 2 tablets by mouth daily.  [name] losartan (COZAAR) 25 MG tablet Take 1 tablet (25 mg total) by mouth daily.  . [DISCONTINUED] insulin detemir (LEVEMIR) 100 UNIT/ML injection Inject 0.25 mLs (25 Units total) into the skin daily.  . [DISCONTINUED] JANUMET XR 50-1000 MG TB24 Take 1 tablet by mouth 2 (two) times daily.    ROS:  Review of Systems  Constitutional: Negative for fever, malaise/fatigue and weight  loss.  Eyes: Negative for blurred vision and double vision.  Respiratory: Negative for cough, shortness of breath and wheezing.   Cardiovascular: Negative for chest pain and palpitations.  Neurological: Negative for dizziness and headaches.     Objective:   Today's Vitals: BP (!) 145/65 (BP Location: Left Arm, Patient Position: Sitting, Cuff Size: Normal)   Pulse 60   Temp 97.9 F (36.6 C) (Temporal)   Ht 5\' 10"  (1.778 m)   Wt 237 lb 6.4 oz (107.7 kg)   SpO2 98%   BMI 34.06 kg/m  Vitals with BMI 01/04/2020 12/28/2019 12/09/2019  Height 5\' 10"  5\' 10"  5\' 10"   Weight 237 lbs 6 oz 237 lbs 233 lbs 13 oz  BMI 34.06 [id number]  Systolic [id number]  Diastolic 65 70 80  Pulse 60 64 66     Physical Exam Vitals reviewed.  Constitutional:      Appearance: Normal appearance.  HENT:     Head: Normocephalic and atraumatic.  Cardiovascular:     Rate and Rhythm: Normal rate and regular rhythm.  Pulmonary:     Effort: Pulmonary effort is normal.     Breath sounds: Normal breath sounds.  Musculoskeletal:     Cervical back: Neck supple.  Skin:    General: Skin is warm and dry.  Neurological:     Mental Status: He is alert and oriented to person, place, and time.  Psychiatric:        Mood and Affect: Mood normal.        Behavior: Behavior normal.        Thought Content: Thought content normal.        Judgment: Judgment normal.          Assessment and Plan   1. Uncontrolled type 2 diabetes mellitus with hyperglycemia ([place])   2. Essential hypertension   3. Atrial fibrillation with RVR ([place])   4. Vitamin D deficiency disease      Plan: 1.  He will continue taking his Janumet daily, per shared decision making the patient would prefer to increase his insulin dose as opposed to add another agent for better control of his blood sugars.  Thus, we will increase his Levemir from 25 units nightly to 28 units nightly.  He is encouraged to continue checking his blood sugars daily, we  also discussed signs and symptoms of hypoglycemia.  We discussed that he should check his blood sugar if he experience any symptoms, and how to treat low blood sugar.  He did  express his understanding.  He will follow-up in approximately 1 month at which point I will collect A1c, and see how his blood sugars have been at home with these changes.    2.  His blood pressure was a bit elevated today, however was better at his last visit with cardiology last week.  Thus, he will continue on his current medication regimen for now.    3.  He will follow-up with cardiology as scheduled.  4.  He will continue taking his vitamin D3 supplement his current dose.  May need to check a vitamin D3 serum level again at next office visit.   Tests ordered No orders of the defined types were placed in this encounter.     Meds ordered this encounter  Medications  . JANUMET XR 50-1000 MG TB24    Sig: Take 2 tablets by mouth daily.    Dispense:  180 tablet    Refill:  0    Order Specific Question:   Supervising Provider    Answer:   Hurshel Party C [id number]  . insulin detemir (LEVEMIR) 100 UNIT/ML injection    Sig: Inject 0.28 mLs (28 Units total) into the skin daily.    Dispense:  10 mL    Refill:  3    Order Specific Question:   Supervising Provider    Answer:   Doree Albee [id number]    Patient to follow-up in 4 to 6 weeks  Ailene Ards, NP

## 2020-01-06 ENCOUNTER — Ambulatory Visit (INDEPENDENT_AMBULATORY_CARE_PROVIDER_SITE_OTHER): Payer: Medicaid Other | Admitting: Nurse Practitioner

## 2020-01-17 ENCOUNTER — Telehealth (INDEPENDENT_AMBULATORY_CARE_PROVIDER_SITE_OTHER): Payer: Self-pay

## 2020-01-17 NOTE — Telephone Encounter (Signed)
[facility] [employer] Approved for Janument 50-1000mg   Valid:01/12/20-01/11/21 UVQQ:[id number]

## 2020-02-17 ENCOUNTER — Other Ambulatory Visit (INDEPENDENT_AMBULATORY_CARE_PROVIDER_SITE_OTHER): Payer: Self-pay | Admitting: Nurse Practitioner

## 2020-02-17 ENCOUNTER — Ambulatory Visit (INDEPENDENT_AMBULATORY_CARE_PROVIDER_SITE_OTHER): Payer: Medicaid Other | Admitting: Nurse Practitioner

## 2020-02-17 ENCOUNTER — Other Ambulatory Visit: Payer: Self-pay

## 2020-02-17 VITALS — BP 150/70 | HR 68 | Temp 97.5°F | Ht 70.0 in | Wt 238.0 lb

## 2020-02-17 DIAGNOSIS — I10 Essential (primary) hypertension: Secondary | ICD-10-CM | POA: Diagnosis not present

## 2020-02-17 DIAGNOSIS — E559 Vitamin D deficiency, unspecified: Secondary | ICD-10-CM | POA: Diagnosis not present

## 2020-02-17 DIAGNOSIS — I4891 Unspecified atrial fibrillation: Secondary | ICD-10-CM

## 2020-02-17 DIAGNOSIS — E1165 Type 2 diabetes mellitus with hyperglycemia: Secondary | ICD-10-CM

## 2020-02-17 MED ORDER — APIXABAN 5 MG PO TABS: 5.0000 mg | ORAL_TABLET | Freq: Two times a day (BID) | ORAL | 3 refills | Status: DC

## 2020-02-17 NOTE — Progress Notes (Signed)
Subjective:  Patient ID: Jeremy Burgess, male    DOB: 1966-04-12  Age: 54 y.o. MRN: [id number]  CC:  Chief Complaint  Patient presents with  . Diabetes  . Hypertension  . Follow-up      HPI  This patient arrives today for the above.  Type 2 diabetes: He continues on his Janumet and his insulin.  At last office visit we did discuss making adjustments to his medication based on A1c being above goal at 8.5 approximately 3 months ago.  With shared decision making we decided to increase his insulin from 25 to 28 units in the evening.  He tells me he has made this change he has been watching his blood sugar at home and his fasting blood sugars have been running around 136.  He denies any hypoglycemic events.  He is up-to-date with foot exam, and tells me he recently saw his eye doctor and was told there is no diabetic retinopathy.  He also continues on his ARB, historically has not been interested in being on statin medication due to concerns related to his liver.  He was injured in an accident and did experience a liver injury in the past.  Although his liver enzymes have been normal on metabolic panels, he is still very hesitant to take a statin.  Hypertension: Continues on his amlodipine and losartan and is tolerating these well.  Atrial fibrillation with RVR: He continues on Eliquis without any signs of bleeding.  He denies any symptoms of heart palpitations or chest pain.  He is not on any rate control medication due to experiencing bradycardia after his cardioversion back in February 2020.  He continues to follow with cardiology.  Vitamin D deficiency: He continues on his supplement of vitamin D3 daily.  Last serum level was 43 and this was collected in March.   Past Medical History:  Diagnosis Date  . Accidentally struck by falling tree 02/08/2015  . Acute blood loss anemia 02/09/2015  . Acute respiratory failure ([place]) 02/09/2015  . Acute respiratory failure ([place]) 02/09/2015  .  Ankle fracture, right 02/09/2015  . Arthritis   . Asthma   . Atrial flutter ([place])    a. diagnosed in 08/2018 b. s/p DCCV in 09/2018 with return to NSR  . Chronic pain due to trauma   . Closed fracture of right ulna 02/08/2015  . Closed right pilon fracture 02/23/2015  . Diabetes mellitus   . Diabetes mellitus ([place])   . Femur fracture, right ([place]) 02/08/2015  . Heart murmur   . History of blood clots    DVT  . Hypertension   . Injury of hand, extensor tendon, right, sequela 09/01/2018  . Pneumothorax   . Postlaminectomy syndrome, lumbar region 11/13/2015  . Rib fracture   . Thrombocytopenia ([place]) 02/09/2015  . Traumatic closed displaced fracture of multiple ribs of right side 02/08/2015  . Traumatic hemopneumothorax 02/09/2015  . Upper GI bleed 05/26/2016  . Upper GI bleeding 05/27/2016   Trivial. EGD unremarkable.      Family History  Problem Relation Age of Onset  . Diabetes Mother   . COPD Mother   . CAD Father        CABG in his 48's  . Diabetes Sister   . Atrial fibrillation Brother   . Colon cancer Neg Hx     Social History   Social History Narrative       Single.Lives alone.On disability secondary to tree fall accident 2016.  Social History   Tobacco Use  . Smoking status: Never Smoker  . Smokeless tobacco: Never Used  Substance Use Topics  . Alcohol use: No    Comment: history of alcohol abuse but quit in 2008      Current Meds  Medication Sig  . amLODipine (NORVASC) 10 MG tablet Take 1 tablet (10 mg total) by mouth daily.  [name] apixaban (ELIQUIS) 5 MG TABS tablet Take 1 tablet (5 mg total) by mouth 2 (two) times daily.  . Cholecalciferol (VITAMIN D-3) 125 MCG (5000 UT) TABS Take 2 tablets by mouth daily.   . insulin detemir (LEVEMIR) 100 UNIT/ML injection Inject 0.28 mLs (28 Units total) into the skin daily.  [name] JANUMET XR 50-1000 MG TB24 Take 2 tablets by mouth daily.  [name] losartan (COZAAR) 25 MG tablet Take 1 tablet (25 mg total) by mouth daily.    ROS:    Review of Systems  Constitutional: Negative for fever and weight loss.  Eyes: Negative for blurred vision.  Respiratory: Negative for shortness of breath.   Cardiovascular: Negative for chest pain and palpitations.  Genitourinary: Negative for frequency.  Neurological: Negative for dizziness and headaches.  Endo/Heme/Allergies: Negative for polydipsia.     Objective:   Today's Vitals: BP (!) 150/70 (BP Location: Left Arm, Patient Position: Sitting, Cuff Size: Normal)   Pulse 68   Temp (!) 97.5 F (36.4 C) (Temporal)   Ht '5\' 10"'  (1.778 m)   Wt 238 lb (108 kg)   SpO2 96%   BMI 34.15 kg/m  Vitals with BMI 02/17/2020 01/04/2020 12/28/2019  Height '5\' 10"'  '5\' 10"'  '5\' 10"'   Weight 238 lbs 237 lbs 6 oz 237 lbs  BMI 34.15 03.15 94.58  Systolic 592 924 462  Diastolic 70 65 70  Pulse 68 60 64     Physical Exam Vitals reviewed.  Constitutional:      Appearance: Normal appearance.  HENT:     Head: Normocephalic and atraumatic.  Cardiovascular:     Rate and Rhythm: Normal rate and regular rhythm.  Pulmonary:     Effort: Pulmonary effort is normal.     Breath sounds: Normal breath sounds.  Musculoskeletal:     Cervical back: Neck supple.  Skin:    General: Skin is warm and dry.  Neurological:     Mental Status: He is alert and oriented to person, place, and time.  Psychiatric:        Mood and Affect: Mood normal.        Behavior: Behavior normal.        Thought Content: Thought content normal.        Judgment: Judgment normal.          Assessment and Plan   1. Uncontrolled type 2 diabetes mellitus with hyperglycemia ([place])   2. Essential hypertension   3. Vitamin D deficiency disease   4. Atrial fibrillation with RVR ([facility])      Plan: 1.-4.  He will continue on his medications as currently prescribed.  We will collect A1c, CMP, and vitamin D level for further evaluation today.  While blood pressure is a bit above goal today, it has been better in the past on his  current regimen.  I am not can make changes today, but may need to be this if blood pressure remains elevated and subsequent visits.   Tests ordered Orders Placed This Encounter  Procedures  . Hemoglobin A1c  . Vitamin D, 25-hydroxy  . CMP with eGFR([facility])  No orders of the defined types were placed in this encounter.   Patient to follow-up in 3 months, or sooner as needed.  Ailene Ards, NP

## 2020-02-18 ENCOUNTER — Other Ambulatory Visit (INDEPENDENT_AMBULATORY_CARE_PROVIDER_SITE_OTHER): Payer: Self-pay | Admitting: Internal Medicine

## 2020-02-18 ENCOUNTER — Other Ambulatory Visit (INDEPENDENT_AMBULATORY_CARE_PROVIDER_SITE_OTHER): Payer: Self-pay | Admitting: Nurse Practitioner

## 2020-02-18 DIAGNOSIS — E1165 Type 2 diabetes mellitus with hyperglycemia: Secondary | ICD-10-CM

## 2020-02-18 LAB — COMPLETE METABOLIC PANEL WITH GFR
AG Ratio: 1.5 (calc) (ref 1.0–2.5)
ALT: 14 U/L (ref 9–46)
AST: 15 U/L (ref 10–35)
Albumin: 4.3 g/dL (ref 3.6–5.1)
Alkaline phosphatase (APISO): 53 U/L (ref 35–144)
BUN: 16 mg/dL (ref 7–25)
CO2: 29 mmol/L (ref 20–32)
Calcium: 9.6 mg/dL (ref 8.6–10.3)
Chloride: 103 mmol/L (ref 98–110)
Creat: 1.1 mg/dL (ref 0.70–1.33)
GFR, Est African American: 87 mL/min/{1.73_m2} (ref >=60)
GFR, Est Non African American: 75 mL/min/{1.73_m2} (ref >=60)
Globulin: 2.8 g/dL (calc) (ref 1.9–3.7)
Glucose, Bld: 121 mg/dL — ABNORMAL HIGH (ref 65–99)
Potassium: 4.4 mmol/L (ref 3.5–5.3)
Sodium: 139 mmol/L (ref 135–146)
Total Bilirubin: 0.5 mg/dL (ref 0.2–1.2)
Total Protein: 7.1 g/dL (ref 6.1–8.1)

## 2020-02-18 LAB — HEMOGLOBIN A1C
Hgb A1c MFr Bld: 8 % of total Hgb — ABNORMAL HIGH (ref <5.7)
Mean Plasma Glucose: 183 (calc)
eAG (mmol/L): 10.1 (calc)

## 2020-02-18 LAB — VITAMIN D 25 HYDROXY (VIT D DEFICIENCY, FRACTURES): Vit D, 25-Hydroxy: 35 ng/mL (ref 30–100)

## 2020-02-18 MED ORDER — INSULIN DETEMIR 100 UNIT/ML ~~LOC~~ SOLN: 30.0000 [IU] | Freq: Every day | SUBCUTANEOUS | 3 refills | Status: DC

## 2020-04-11 ENCOUNTER — Other Ambulatory Visit (INDEPENDENT_AMBULATORY_CARE_PROVIDER_SITE_OTHER): Payer: Self-pay

## 2020-04-11 DIAGNOSIS — E1165 Type 2 diabetes mellitus with hyperglycemia: Secondary | ICD-10-CM

## 2020-04-11 MED ORDER — "INSULIN SYRINGE-NEEDLE U-100 31G X 5/16"" 1 ML MISC": 0 refills | Status: DC

## 2020-04-11 MED ORDER — JANUMET XR 50-1000 MG PO TB24: 2.0000 | ORAL_TABLET | Freq: Every day | ORAL | 0 refills | Status: DC

## 2020-04-11 MED ORDER — INSULIN DETEMIR 100 UNIT/ML ~~LOC~~ SOLN: 30.0000 [IU] | Freq: Every day | SUBCUTANEOUS | 3 refills | Status: DC

## 2020-04-11 MED ORDER — ACCU-CHEK GUIDE VI STRP: ORAL_STRIP | 0 refills | Status: DC

## 2020-04-11 NOTE — Telephone Encounter (Signed)
Levemir and Janumet sent to [facility] and Needles and strips go to [facility]

## 2020-04-17 ENCOUNTER — Other Ambulatory Visit (INDEPENDENT_AMBULATORY_CARE_PROVIDER_SITE_OTHER): Payer: Self-pay | Admitting: Nurse Practitioner

## 2020-04-17 DIAGNOSIS — E1165 Type 2 diabetes mellitus with hyperglycemia: Secondary | ICD-10-CM

## 2020-05-01 ENCOUNTER — Other Ambulatory Visit (INDEPENDENT_AMBULATORY_CARE_PROVIDER_SITE_OTHER): Payer: Self-pay

## 2020-05-01 DIAGNOSIS — E1165 Type 2 diabetes mellitus with hyperglycemia: Secondary | ICD-10-CM

## 2020-05-01 MED ORDER — ACCU-CHEK GUIDE VI STRP: ORAL_STRIP | 11 refills | Status: DC

## 2020-05-01 MED ORDER — INSULIN DETEMIR 100 UNIT/ML ~~LOC~~ SOLN: 25.0000 [IU] | Freq: Every day | SUBCUTANEOUS | 1 refills | Status: DC

## 2020-05-01 MED ORDER — "INSULIN SYRINGE-NEEDLE U-100 31G X 5/16"" 1 ML MISC": 11 refills | Status: DC

## 2020-05-23 ENCOUNTER — Other Ambulatory Visit: Payer: Self-pay

## 2020-05-23 ENCOUNTER — Ambulatory Visit (INDEPENDENT_AMBULATORY_CARE_PROVIDER_SITE_OTHER): Payer: Medicaid Other | Admitting: Internal Medicine

## 2020-05-23 ENCOUNTER — Encounter (INDEPENDENT_AMBULATORY_CARE_PROVIDER_SITE_OTHER): Payer: Self-pay | Admitting: Internal Medicine

## 2020-05-23 VITALS — BP 148/82 | HR 66 | Temp 97.5°F | Ht 70.0 in | Wt 245.0 lb

## 2020-05-23 DIAGNOSIS — Z23 Encounter for immunization: Secondary | ICD-10-CM | POA: Diagnosis not present

## 2020-05-23 DIAGNOSIS — E559 Vitamin D deficiency, unspecified: Secondary | ICD-10-CM | POA: Diagnosis not present

## 2020-05-23 DIAGNOSIS — R079 Chest pain, unspecified: Secondary | ICD-10-CM

## 2020-05-23 DIAGNOSIS — I10 Essential (primary) hypertension: Secondary | ICD-10-CM | POA: Diagnosis not present

## 2020-05-23 DIAGNOSIS — I4891 Unspecified atrial fibrillation: Secondary | ICD-10-CM | POA: Diagnosis not present

## 2020-05-23 DIAGNOSIS — E1165 Type 2 diabetes mellitus with hyperglycemia: Secondary | ICD-10-CM | POA: Diagnosis not present

## 2020-05-23 MED ORDER — APIXABAN 5 MG PO TABS: 5.0000 mg | ORAL_TABLET | Freq: Two times a day (BID) | ORAL | 3 refills | Status: DC

## 2020-05-23 MED ORDER — LOSARTAN POTASSIUM 25 MG PO TABS: 25.0000 mg | ORAL_TABLET | Freq: Every day | ORAL | 1 refills | Status: DC

## 2020-05-23 MED ORDER — INSULIN DETEMIR 100 UNIT/ML ~~LOC~~ SOLN: 25.0000 [IU] | Freq: Every day | SUBCUTANEOUS | 1 refills | Status: DC

## 2020-05-23 MED ORDER — AMLODIPINE BESYLATE 10 MG PO TABS: 10.0000 mg | ORAL_TABLET | Freq: Every day | ORAL | 1 refills | Status: DC

## 2020-05-23 NOTE — Progress Notes (Signed)
Metrics: Intervention Frequency ACO  Documented Smoking Status Yearly  Screened one or more times in 24 months  Cessation Counseling or  Active cessation medication Past 24 months  Past 24 months   Guideline developer: UpToDate (See UpToDate for funding source) Date Released: 2014       Wellness Office Visit  Subjective:  Patient ID: Jeremy Burgess, male    DOB: December 01, 1965  Age: 54 y.o. MRN: [id number]  CC: This man comes in for follow-up of diabetes, hypertension, vitamin D deficiency and atrial fibrillation. HPI Today he has been complaining of a 2-week history of left-sided upper chest pain which apparently radiates to his left arm and up the left neck.  It can occur at rest.  It does not seem to be waxing and waning typical of angina but he is concerned about heart disease.  He does have an appointment with cardiology in November for follow-up regarding his atrial fibrillation. As far as his diabetes is concerned, he continues on the same medications and his last hemoglobin A1c did seem to improve to 8%. He continues on amlodipine and losartan for hypertension and is compliant. He continues on vitamin D3 10,000 units daily according to him but his vitamin D levels on the last visit were suboptimal.  Past Medical History:  Diagnosis Date  . Accidentally struck by falling tree 02/08/2015  . Acute blood loss anemia 02/09/2015  . Acute respiratory failure ([place]) 02/09/2015  . Acute respiratory failure ([place]) 02/09/2015  . Ankle fracture, right 02/09/2015  . Arthritis   . Asthma   . Atrial flutter ([place])    a. diagnosed in 08/2018 b. s/p DCCV in 09/2018 with return to NSR  . Chronic pain due to trauma   . Closed fracture of right ulna 02/08/2015  . Closed right pilon fracture 02/23/2015  . Diabetes mellitus   . Diabetes mellitus ([place])   . Femur fracture, right ([place]) 02/08/2015  . Heart murmur   . History of blood clots    DVT  . Hypertension   . Injury of hand, extensor tendon, right,  sequela 09/01/2018  . Pneumothorax   . Postlaminectomy syndrome, lumbar region 11/13/2015  . Rib fracture   . Thrombocytopenia ([place]) 02/09/2015  . Traumatic closed displaced fracture of multiple ribs of right side 02/08/2015  . Traumatic hemopneumothorax 02/09/2015  . Upper GI bleed 05/26/2016  . Upper GI bleeding 05/27/2016   Trivial. EGD unremarkable.   Past Surgical History:  Procedure Laterality Date  . BACK SURGERY    . CARDIOVERSION N/A 10/06/2018   Procedure: CARDIOVERSION;  Surgeon: [name], MD;  Location: [facility];  Service: Cardiovascular;  Laterality: N/A;  . CHOLECYSTECTOMY N/A 10/30/2016   Procedure: LAPAROSCOPIC CHOLECYSTECTOMY;  Surgeon: [name], MD;  Location: [facility];  Service: General;  Laterality: N/A;  . COLONOSCOPY N/A 07/25/2016   Dr. [name]: 9 mm tubular adenoma. 5 year surveillance   . ESOPHAGOGASTRODUODENOSCOPY  2009   EGD completed by Dr. [name] due to hematemesis. Findings of normal esophagus, tiny antral erosions, normal D1, D2  . ESOPHAGOGASTRODUODENOSCOPY N/A 05/27/2016   Dr. [name]: normal   . EXTERNAL FIXATION LEG Right 02/09/2015   Procedure: EXTERNAL FIXATION LEG;  Surgeon: [name], MD;  Location: [facility];  Service: Orthopedics;  Laterality: Right;  . EXTERNAL FIXATION REMOVAL Right 02/14/2015   Procedure: REMOVAL EXTERNAL FIXATION LEG;  Surgeon: [name], MD;  Location: [place];  Service: Orthopedics;  Laterality: Right;  . FEMUR IM NAIL Right 02/09/2015  Procedure: INTRAMEDULLARY (IM) RETROGRADE FEMORAL NAILING;  Surgeon: [name], MD;  Location: [place];  Service: Orthopedics;  Laterality: Right;  . NOSE SURGERY    . ORIF ANKLE FRACTURE Right 02/14/2015   Procedure: OPEN REDUCTION INTERNAL FIXATION (ORIF) PILON FRACTURE;  Surgeon: [name], MD;  Location: [place];  Service: Orthopedics;  Laterality: Right;  . ORIF ULNAR FRACTURE Right 02/09/2015   Procedure: OPEN REDUCTION INTERNAL FIXATION (ORIF) ULNAR FRACTURE;  Surgeon:  [name], MD;  Location: [place];  Service: Orthopedics;  Laterality: Right;  . POLYPECTOMY  07/25/2016   Procedure: POLYPECTOMY;  Surgeon: [name], MD;  Location[facility];  Service: Endoscopy;;  colon     Family History  Problem Relation Age of Onset  . Diabetes Mother   . COPD Mother   . CAD Father        CABG in his 18's  . Diabetes Sister   . Atrial fibrillation Brother   . Colon cancer Neg Hx     Social History   Social History Narrative       Single.Lives alone.On disability secondary to tree fall accident 2016.   Social History   Tobacco Use  . Smoking status: Never Smoker  . Smokeless tobacco: Never Used  Substance Use Topics  . Alcohol use: No    Comment: history of alcohol abuse but quit in 2008     Current Meds  Medication Sig  . amLODipine (NORVASC) 10 MG tablet Take 1 tablet (10 mg total) by mouth daily.  [name] apixaban (ELIQUIS) 5 MG TABS tablet Take 1 tablet (5 mg total) by mouth 2 (two) times daily.  . Cholecalciferol (VITAMIN D-3) 125 MCG (5000 UT) TABS Take 2 tablets by mouth daily.   [name] glucose blood (ACCU-CHEK GUIDE) test strip USE TO TEST TWICE DAILY.  [name] insulin detemir (LEVEMIR) 100 UNIT/ML injection Inject 0.25 mLs (25 Units total) into the skin daily.  . Insulin Syringe-Needle U-100 (SURE COMFORT INSULIN SYRINGE) 31G X 5/16" 1 ML MISC USE AS DIRECTED.  [name] JANUMET XR 50-1000 MG TB24 Take 2 tablets by mouth daily.  [name] losartan (COZAAR) 25 MG tablet Take 1 tablet (25 mg total) by mouth daily.  . [DISCONTINUED] amLODipine (NORVASC) 10 MG tablet Take 1 tablet (10 mg total) by mouth daily.  . [DISCONTINUED] apixaban (ELIQUIS) 5 MG TABS tablet Take 1 tablet (5 mg total) by mouth 2 (two) times daily.  . [DISCONTINUED] insulin detemir (LEVEMIR) 100 UNIT/ML injection Inject 0.25 mLs (25 Units total) into the skin daily.  . [DISCONTINUED] losartan (COZAAR) 25 MG tablet Take 1 tablet (25 mg total) by mouth daily.      Depression screen [facility]  2/9 10/14/2019 06/23/2017 10/14/2016 12/11/2015 11/13/2015  Decreased Interest 0 1 3 3 3   Down, Depressed, Hopeless 0 1 3 3 3   PHQ - 2 Score 0 2 6 6 6   Altered sleeping - - - - 3  Tired, decreased energy - - - - 3  Change in appetite - - - - 3  Feeling bad or failure about yourself  - - - - 3  Trouble concentrating - - - - 3  Moving slowly or fidgety/restless - - - - 3  Suicidal thoughts - - - - 0  PHQ-9 Score - - - - 24     Objective:   Today's Vitals: BP (!) 148/82   Pulse 66   Temp (!) 97.5 F (36.4 C) (Temporal)   Ht 5\' 10"  (  1.778 m)   Wt 245 lb (111.1 kg)   SpO2 98%   BMI 35.15 kg/m  Vitals with BMI 05/23/2020 02/17/2020 01/04/2020  Height 5\' 10"  5\' 10"  5\' 10"   Weight 245 lbs 238 lbs 237 lbs 6 oz  BMI 35.15 33.35 45.62  Systolic 563 893 734  Diastolic 82 70 65  Pulse 66 68 60     Physical Exam       Assessment   1. Uncontrolled type 2 diabetes mellitus with hyperglycemia ([place])   2. Essential hypertension   3. Vitamin D deficiency disease   4. Atrial fibrillation with RVR ([facility])   5. Chest pain, unspecified type       Tests ordered Orders Placed This Encounter  Procedures  . COMPLETE METABOLIC PANEL WITH GFR  . VITAMIN D 25 Hydroxy (Vit-D Deficiency, Fractures)  . Hemoglobin A1c  . Ambulatory referral to Cardiology     Plan: 1. He will continue with current therapy for diabetes and we will check an A1c. 2. He will continue with losartan and amlodipine and monitor blood pressure closely today's blood pressure was somewhat elevated. 3. I will send him to cardiology regarding his chest pain as I think he probably would benefit from stress test at the minimum due to his cardiac risk factors. 4. Influenza vaccination will be given today.  I did discuss with him COVID-19 vaccination and if strongly urged him to get vaccinated. 5. Follow-up in 3 months with Judson Roch and further recommendations will depend on blood results.   Meds ordered this encounter    Medications  . amLODipine (NORVASC) 10 MG tablet    Sig: Take 1 tablet (10 mg total) by mouth daily.    Dispense:  90 tablet    Refill:  1  . losartan (COZAAR) 25 MG tablet    Sig: Take 1 tablet (25 mg total) by mouth daily.    Dispense:  90 tablet    Refill:  1  . insulin detemir (LEVEMIR) 100 UNIT/ML injection    Sig: Inject 0.25 mLs (25 Units total) into the skin daily.    Dispense:  10 mL    Refill:  1  . apixaban (ELIQUIS) 5 MG TABS tablet    Sig: Take 1 tablet (5 mg total) by mouth 2 (two) times daily.    Dispense:  60 tablet    Refill:  3    Archie Atilano Luther Parody, MD

## 2020-05-23 NOTE — Addendum Note (Signed)
Addended by: Anibal Henderson on: 05/23/2020 08:27 AM   Modules accepted: Orders

## 2020-05-24 LAB — HEMOGLOBIN A1C
Hgb A1c MFr Bld: 8.1 % of total Hgb — ABNORMAL HIGH (ref <5.7)
Mean Plasma Glucose: 186 (calc)
eAG (mmol/L): 10.3 (calc)

## 2020-05-24 LAB — COMPLETE METABOLIC PANEL WITH GFR
AG Ratio: 1.5 (calc) (ref 1.0–2.5)
ALT: 24 U/L (ref 9–46)
AST: 20 U/L (ref 10–35)
Albumin: 4.4 g/dL (ref 3.6–5.1)
Alkaline phosphatase (APISO): 56 U/L (ref 35–144)
BUN: 22 mg/dL (ref 7–25)
CO2: 29 mmol/L (ref 20–32)
Calcium: 9.3 mg/dL (ref 8.6–10.3)
Chloride: 101 mmol/L (ref 98–110)
Creat: 1.11 mg/dL (ref 0.70–1.33)
GFR, Est African American: 86 mL/min/{1.73_m2} (ref >=60)
GFR, Est Non African American: 74 mL/min/{1.73_m2} (ref >=60)
Globulin: 2.9 g/dL (calc) (ref 1.9–3.7)
Glucose, Bld: 157 mg/dL — ABNORMAL HIGH (ref 65–99)
Potassium: 4.4 mmol/L (ref 3.5–5.3)
Sodium: 139 mmol/L (ref 135–146)
Total Bilirubin: 0.6 mg/dL (ref 0.2–1.2)
Total Protein: 7.3 g/dL (ref 6.1–8.1)

## 2020-05-24 LAB — VITAMIN D 25 HYDROXY (VIT D DEFICIENCY, FRACTURES): Vit D, 25-Hydroxy: 35 ng/mL (ref 30–100)

## 2020-05-24 NOTE — Progress Notes (Signed)
Please call patient and let him know that his diabetes is stable.  His vitamin D levels are still not optimal so if he is taking vitamin D3 10,000 units daily as he told me, he needs to increase the vitamin D3 to 15,000 units daily.  Continue to work on nutrition as before.  Follow-up as scheduled.

## 2020-05-28 NOTE — Progress Notes (Signed)
Cardiology Office Note  Date: 05/29/2020   ID: [name], DOB November 24, 1965, MRN [id number]  PCP:  Doree Albee, MD  Cardiologist:  Carlyle Dolly, MD Electrophysiologist:  None   Chief Complaint: Follow-up atrial flutter  History of Present Illness: Jeremy Burgess is a 54 y.o. male with a history of atrial flutter.  Post cardioversion 10/06/2018.  Cardizem stopped due to low heart rates.  Hypertension managed by PCP.  Type 2 diabetes uncontrolled.  Last seen by [name], PA 12/28/2019 for yearly follow-up.  Blood pressure was running high and PCP had started losartan and amlodipine.  He was trying to watch salt intake.  Having chronic pain but denied any chest pain, dyspnea, dizziness, presyncope.  Was attempting to walk some but limited with back pain.  Unable to tolerate AV nodal blocking agents due to bradycardia after cardioversion.  She mentioned chronic pain could be contributing to elevated blood pressures.  He was to continue 2 g sodium diet.  Blood pressure on visit was well controlled.  He is here for complaints of recent chest pain.  He recently saw his primary care provider and expressed the fact that he was having some left-sided chest pain with some radiation to left arm and left neck and jaw.  He stated it could occur at rest.  He denied any associated nausea, vomiting, or diaphoresis.  He does have a long history of diabetes which is uncontrolled.  History of hypertension not currently under control.  Blood pressure on arrival today was 144/60.  Blood pressures appear to be running in the high 585I to 778E systolic.  Otherwise he denies any exertional symptoms, previous cardioversion on 10/06/2018.  Today EKG shows sinus rhythm with a rate of 69.  Denies any recent palpitations or arrhythmias.  No PND or orthopnea no CVA or TIA-like symptoms, lightheadedness, dizziness, presyncope or syncopal episodes.  No bleeding issues.  No claudication-like symptoms.  No DVT or  PE-like symptoms, or lower extremity edema.    Past Medical History:  Diagnosis Date  . Accidentally struck by falling tree 02/08/2015  . Acute blood loss anemia 02/09/2015  . Acute respiratory failure ([place]) 02/09/2015  . Acute respiratory failure ([place]) 02/09/2015  . Ankle fracture, right 02/09/2015  . Arthritis   . Asthma   . Atrial flutter (Jeremy Burgess)    a. diagnosed in 08/2018 b. s/p DCCV in 09/2018 with return to NSR  . Chronic pain due to trauma   . Closed fracture of right ulna 02/08/2015  . Closed right pilon fracture 02/23/2015  . Diabetes mellitus   . Diabetes mellitus (Jeremy Burgess)   . Femur fracture, right (Jeremy Burgess) 02/08/2015  . Heart murmur   . History of blood clots    DVT  . Hypertension   . Injury of hand, extensor tendon, right, sequela 09/01/2018  . Pneumothorax   . Postlaminectomy syndrome, lumbar region 11/13/2015  . Rib fracture   . Thrombocytopenia (Jeremy Burgess) 02/09/2015  . Traumatic closed displaced fracture of multiple ribs of right side 02/08/2015  . Traumatic hemopneumothorax 02/09/2015  . Upper GI bleed 05/26/2016  . Upper GI bleeding 05/27/2016   Trivial. EGD unremarkable.    Past Surgical History:  Procedure Laterality Date  . BACK SURGERY    . CARDIOVERSION N/A 10/06/2018   Procedure: CARDIOVERSION;  Surgeon: [name], MD;  [facility]: [facility];  Service: Cardiovascular;  Laterality: N/A;  . CHOLECYSTECTOMY N/A 10/30/2016   Procedure: LAPAROSCOPIC CHOLECYSTECTOMY;  Surgeon: [name], MD;  Location:  [facility];  Service: General;  Laterality: N/A;  . COLONOSCOPY N/A 07/25/2016   Dr. [name]: 9 mm tubular adenoma. 5 year surveillance   . ESOPHAGOGASTRODUODENOSCOPY  2009   EGD completed by Dr. [name] due to hematemesis. Findings of normal esophagus, tiny antral erosions, normal D1, D2  . ESOPHAGOGASTRODUODENOSCOPY N/A 05/27/2016   Dr. [name]: normal   . EXTERNAL FIXATION LEG Right 02/09/2015   Procedure: EXTERNAL FIXATION LEG;  Surgeon: [name], MD;  Location: [place];   Service: Orthopedics;  Laterality: Right;  . EXTERNAL FIXATION REMOVAL Right 02/14/2015   Procedure: REMOVAL EXTERNAL FIXATION LEG;  Surgeon: [name], MD;  Location: [place];  Service: Orthopedics;  Laterality: Right;  . FEMUR IM NAIL Right 02/09/2015   Procedure: INTRAMEDULLARY (IM) RETROGRADE FEMORAL NAILING;  Surgeon: [name], MD;  Location: [place];  Service: Orthopedics;  Laterality: Right;  . NOSE SURGERY    . ORIF ANKLE FRACTURE Right 02/14/2015   Procedure: OPEN REDUCTION INTERNAL FIXATION (ORIF) PILON FRACTURE;  Surgeon: [name], MD;  Location: [place];  Service: Orthopedics;  Laterality: Right;  . ORIF ULNAR FRACTURE Right 02/09/2015   Procedure: OPEN REDUCTION INTERNAL FIXATION (ORIF) ULNAR FRACTURE;  Surgeon: [name], MD;  Location: [place];  Service: Orthopedics;  Laterality: Right;  . POLYPECTOMY  07/25/2016   Procedure: POLYPECTOMY;  Surgeon: [name], MD;  [facility];  Service: Endoscopy;;  colon    Current Outpatient Medications  Medication Sig Dispense Refill  . amLODipine (NORVASC) 10 MG tablet Take 1 tablet (10 mg total) by mouth daily. 90 tablet 1  . apixaban (ELIQUIS) 5 MG TABS tablet Take 1 tablet (5 mg total) by mouth 2 (two) times daily. 60 tablet 3  . Cholecalciferol (VITAMIN D-3) 125 MCG (5000 UT) TABS Take 2 tablets by mouth daily.     [name] glucose blood (ACCU-CHEK GUIDE) test strip USE TO TEST TWICE DAILY. 200 strip 11  . insulin detemir (LEVEMIR) 100 UNIT/ML injection Inject 0.25 mLs (25 Units total) into the skin daily. 10 mL 1  . Insulin Syringe-Needle U-100 (SURE COMFORT INSULIN SYRINGE) 31G X 5/16" 1 ML MISC USE AS DIRECTED. 100 each 11  . JANUMET XR 50-1000 MG TB24 Take 2 tablets by mouth daily. 180 tablet 0  . losartan (COZAAR) 25 MG tablet Take 1 tablet (25 mg total) by mouth daily. 90 tablet 1   No current facility-administered medications for this visit.   Allergies:  Bee venom and Lisinopril   Social History:  The patient  reports that he has never smoked. He has never used smokeless tobacco. He reports that he does not drink alcohol and does not use drugs.   Family History: The patient's family history includes Atrial fibrillation in his brother; CAD in his father; COPD in his mother; Diabetes in his mother and sister.   ROS:  Please see the history of present illness. Otherwise, complete review of systems is positive for none.  All other systems are reviewed and negative.   Physical Exam: VS:  BP (!) 144/60   Pulse 68   Ht 5\' 11"  (1.803 m)   Wt 247 lb (112 kg)   SpO2 96%   BMI 34.45 kg/m , BMI Body mass index is 34.45 kg/m.  Wt Readings from Last 3 Encounters:  05/29/20 247 lb (112 kg)  05/23/20 245 lb (111.1 kg)  02/17/20 238 lb (108 kg)    General: Patient appears comfortable at rest. Neck: Supple,  no elevated JVP or carotid bruits, no thyromegaly. Lungs: Clear to auscultation, nonlabored breathing at rest. Cardiac: Regular rate and rhythm, no S3 or significant systolic murmur, no pericardial rub. Extremities: No pitting edema, distal pulses 2+. Skin: Warm and dry. Musculoskeletal: No kyphosis. Neuropsychiatric: Alert and oriented x3, affect grossly appropriate.  ECG:  An ECG dated 05/29/2020 was personally reviewed today and demonstrated:  Sinus rhythm rate of 69.  Recent Labwork: 07/13/2019: Hemoglobin 14.9; Platelets 264 05/23/2020: ALT 24; AST 20; BUN 22; Creat 1.11; Potassium 4.4; Sodium 139     Component Value Date/Time   CHOL 120 09/01/2018 0738   TRIG 58 09/01/2018 0738   HDL 35 (L) 09/01/2018 0738   CHOLHDL 3.4 09/01/2018 0738   VLDL 12 09/01/2018 0738   LDLCALC 73 09/01/2018 0738    Other Studies Reviewed Today:  Echocardiogram: 08/2018 Study Conclusions - Left ventricle: The cavity size was normal. Wall thickness was normal. Systolic function was normal. The estimated ejection fraction was in the range of 60% to 65%. Wall motion was normal; there  were no regional wall motion abnormalities. The study is not technically sufficient to allow evaluation of LV diastolic function. - Aortic valve: Valve area (VTI): 2.83 cm^2. Valve area (Vmax): 2.61 cm^2. - Left atrium: The atrium was moderately to severely dilated. - Right atrium: The atrium was mildly dilated. - Atrial septum: No defect or patent foramen ovale was identified.    Assessment and Plan:  1. Chest pain, unspecified type   2. Typical atrial flutter ([place])   3. Essential hypertension   4. Uncontrolled type 2 diabetes mellitus with hyperglycemia ([place])    1. Chest pain, unspecified type Complaining of left precordial chest pain with accompanying left arm pain, left neck pain left jaw pain.  States it can occur at rest and with activity.  States it waxes and wanes.  Intermittent in nature.  Mild to moderate in severity.  Denies any associated nausea, vomiting or diaphoresis.  Given multiple risk factors need to proceed with a Lexiscan stress test.  2. Typical atrial flutter ([place]) Denies any recent palpitations or arrhythmias.  EKG today shows normal sinus rhythm with a rate of 69.  3. Essential hypertension Blood pressure today is 144/60.  Had recent blood pressures on 05/23/2020 148/82 and on 02/17/2020 150/70.  Continue amlodipine 10 mg daily.  Increase losartan to 50 mg daily.   4. Uncontrolled type 2 diabetes mellitus with hyperglycemia [facility]) Patient recently saw his primary care provider Dr. Anastasio Champion.  Recent hemoglobin A1c was 8%.  He is on insulin detemir 25 units subcu daily.  Janumet 50/100 mg tablets daily  Medication Adjustments/Labs and Tests Ordered: Current medicines are reviewed at length with the patient today.  Concerns regarding medicines are outlined above.   Disposition: Follow-up with Dr. Harl Bowie or APP 1 month  Signed, Levell July, NP 05/29/2020 2:04 PM    [facility] [place], [place], [place]  [address] Phone: [phone]; Fax: [phone]

## 2020-05-29 ENCOUNTER — Encounter: Payer: Self-pay | Admitting: Family Medicine

## 2020-05-29 ENCOUNTER — Ambulatory Visit (INDEPENDENT_AMBULATORY_CARE_PROVIDER_SITE_OTHER): Payer: Medicaid Other | Admitting: Family Medicine

## 2020-05-29 ENCOUNTER — Encounter: Payer: Self-pay | Admitting: *Deleted

## 2020-05-29 VITALS — BP 144/60 | HR 68 | Ht 71.0 in | Wt 247.0 lb

## 2020-05-29 DIAGNOSIS — R0789 Other chest pain: Secondary | ICD-10-CM

## 2020-05-29 DIAGNOSIS — I10 Essential (primary) hypertension: Secondary | ICD-10-CM | POA: Diagnosis not present

## 2020-05-29 DIAGNOSIS — I483 Typical atrial flutter: Secondary | ICD-10-CM | POA: Diagnosis not present

## 2020-05-29 DIAGNOSIS — R079 Chest pain, unspecified: Secondary | ICD-10-CM | POA: Diagnosis not present

## 2020-05-29 DIAGNOSIS — E1165 Type 2 diabetes mellitus with hyperglycemia: Secondary | ICD-10-CM | POA: Diagnosis not present

## 2020-05-29 MED ORDER — LOSARTAN POTASSIUM 50 MG PO TABS: 50.0000 mg | ORAL_TABLET | Freq: Every day | ORAL | 6 refills | Status: DC

## 2020-05-29 NOTE — Patient Instructions (Addendum)
Medication Instructions:   Increase Losaratan to 50mg  daily.  Continue all other medications.    Labwork: none  Testing/Procedures:  Your physician has requested that you have a lexiscan myoview. For further information please visit [email or web address]. Please follow instruction sheet, as given.  Office will contact with results via phone or letter.     Follow-Up: 4-6 weeks   Any Other Special Instructions Will Be Listed Below (If Applicable).  If you need a refill on your cardiac medications before your next appointment, please call your pharmacy.

## 2020-05-30 NOTE — Progress Notes (Signed)
Pt called and given lab results. Ptwill work on [employer]. Pt will start taking Vit D3 15,000iu's/daily

## 2020-06-07 ENCOUNTER — Encounter (HOSPITAL_COMMUNITY)
Admission: RE | Admit: 2020-06-07 | Discharge: 2020-06-07 | Disposition: A | Discharge status: Home / Self Care | Payer: Medicaid Other | Source: Ambulatory Visit | Attending: Family Medicine | Admitting: Family Medicine

## 2020-06-07 ENCOUNTER — Other Ambulatory Visit: Payer: Self-pay

## 2020-06-07 ENCOUNTER — Ambulatory Visit (HOSPITAL_COMMUNITY)
Admission: RE | Admit: 2020-06-07 | Discharge: 2020-06-07 | Disposition: A | Discharge status: Home / Self Care | Payer: Medicaid Other | Source: Ambulatory Visit | Attending: Family Medicine | Admitting: Family Medicine

## 2020-06-07 DIAGNOSIS — R0789 Other chest pain: Secondary | ICD-10-CM

## 2020-06-07 LAB — NM MYOCAR MULTI W/SPECT W/WALL MOTION / EF
LV dias vol: 109 mL (ref 62–150)
LV sys vol: 44 mL
Peak HR: 91 {beats}/min
RATE: 0.36
Rest HR: 57 {beats}/min
SDS: 0
SRS: 1
SSS: 1
TID: 1.27

## 2020-06-07 MED ORDER — TECHNETIUM TC 99M TETROFOSMIN IV KIT
10.0000 | PACK | Freq: Once | INTRAVENOUS | Status: AC | PRN
  Administered 2020-06-07: 10.2 via INTRAVENOUS

## 2020-06-07 MED ORDER — TECHNETIUM TC 99M TETROFOSMIN IV KIT
30.0000 | PACK | Freq: Once | INTRAVENOUS | Status: AC | PRN
  Administered 2020-06-07: 31.3 via INTRAVENOUS

## 2020-06-07 MED ORDER — SODIUM CHLORIDE FLUSH 0.9 % IV SOLN
INTRAVENOUS | Status: AC
  Administered 2020-06-07: 10 mL via INTRAVENOUS
  Filled 2020-06-07: qty 10

## 2020-06-07 MED ORDER — REGADENOSON 0.4 MG/5ML IV SOLN
INTRAVENOUS | Status: AC
  Administered 2020-06-07: 0.4 mg via INTRAVENOUS
  Filled 2020-06-07: qty 5

## 2020-06-08 ENCOUNTER — Telehealth: Payer: Self-pay | Admitting: *Deleted

## 2020-06-08 NOTE — Telephone Encounter (Signed)
Laurine Blazer, LPN  [id number]/[id number] 5:70 PM EDT Back to Top    Notified, copy to pcp.

## 2020-06-08 NOTE — Telephone Encounter (Signed)
-----   Message from Verta Ellen., NP sent at 06/07/2020  6:56 PM EDT ----- Please call the patient and let him know the stress test did not show any evidence of lack of blood flow through the arteries in his heart.

## 2020-06-27 ENCOUNTER — Other Ambulatory Visit: Payer: Self-pay

## 2020-06-27 ENCOUNTER — Encounter: Payer: Self-pay | Admitting: Cardiology

## 2020-06-27 ENCOUNTER — Ambulatory Visit (INDEPENDENT_AMBULATORY_CARE_PROVIDER_SITE_OTHER): Payer: Medicaid Other | Admitting: Cardiology

## 2020-06-27 ENCOUNTER — Telehealth: Payer: Self-pay | Admitting: Licensed Clinical Social Worker

## 2020-06-27 VITALS — BP 138/76 | HR 69 | Ht 71.0 in | Wt 238.4 lb

## 2020-06-27 DIAGNOSIS — I10 Essential (primary) hypertension: Secondary | ICD-10-CM | POA: Diagnosis not present

## 2020-06-27 DIAGNOSIS — I483 Typical atrial flutter: Secondary | ICD-10-CM

## 2020-06-27 DIAGNOSIS — R079 Chest pain, unspecified: Secondary | ICD-10-CM

## 2020-06-27 MED ORDER — LOSARTAN POTASSIUM 100 MG PO TABS: 100.0000 mg | ORAL_TABLET | Freq: Every day | ORAL | 3 refills | Status: DC

## 2020-06-27 NOTE — Progress Notes (Signed)
Clinical Summary Mr. Jeremy Jeremy Burgess is a 54 y.o.male  seen today for follow up of the following medical problems.   1. Atrial flutter - new diagnosis during Jan 2020 admission - at that time started on anticoag, later cardioverted 10/06/2018 - during 10/23/2018 visit was still in Jeremy Jeremy Burgess - currently on eliquis. Cardizem 120 stopped after cardioversion due to some low heart rates.    - no recent palpitations - no bleeding on eliquis.   2. HTN - recent changes by pcp. Lisinopril due to cough. Started on norvasc  3. Chest pain - 05/2020 visit with PA Jeremy Jeremy Burgess reported left sided chest pain - 05/2020 nuclear stress: no ischemia.   - symptoms have resolved.    Jan 2020: TC 120 TG 58 HDL 35 LDL 73  Past Medical History:  Diagnosis Date  . Accidentally struck by falling tree 02/08/2015  . Acute blood loss anemia 02/09/2015  . Acute respiratory failure ([place]) 02/09/2015  . Acute respiratory failure ([place]) 02/09/2015  . Ankle fracture, right 02/09/2015  . Arthritis   . Asthma   . Atrial flutter ([place])    a. diagnosed in 08/2018 b. s/p DCCV in 09/2018 with return to NSR  . Chronic pain due to trauma   . Closed fracture of right ulna 02/08/2015  . Closed right pilon fracture 02/23/2015  . Diabetes mellitus   . Diabetes mellitus ([place])   . Femur fracture, right ([place]) 02/08/2015  . Heart murmur   . History of blood clots    DVT  . Hypertension   . Injury of hand, extensor tendon, right, sequela 09/01/2018  . Pneumothorax   . Postlaminectomy syndrome, lumbar region 11/13/2015  . Rib fracture   . Thrombocytopenia ([place]) 02/09/2015  . Traumatic closed displaced fracture of multiple ribs of right side 02/08/2015  . Traumatic hemopneumothorax 02/09/2015  . Upper GI bleed 05/26/2016  . Upper GI bleeding 05/27/2016   Trivial. EGD unremarkable.     Allergies  Allergen Reactions  . Bee Venom Anaphylaxis    HONEY BEES  . Lisinopril Cough     Current Outpatient Medications  Medication Sig  Dispense Refill  . amLODipine (NORVASC) 10 MG tablet Take 1 tablet (10 mg total) by mouth daily. 90 tablet 1  . apixaban (ELIQUIS) 5 MG TABS tablet Take 1 tablet (5 mg total) by mouth 2 (two) times daily. 60 tablet 3  . Cholecalciferol (VITAMIN D-3) 125 MCG (5000 UT) TABS Take 2 tablets by mouth daily.     [name] glucose blood (ACCU-CHEK GUIDE) test strip USE TO TEST TWICE DAILY. 200 strip 11  . insulin detemir (LEVEMIR) 100 UNIT/ML injection Inject 0.25 mLs (25 Units total) into the skin daily. 10 mL 1  . Insulin Syringe-Needle U-100 (SURE COMFORT INSULIN SYRINGE) 31G X 5/16" 1 ML MISC USE AS DIRECTED. 100 each 11  . JANUMET XR 50-1000 MG TB24 Take 2 tablets by mouth daily. 180 tablet 0  . losartan (COZAAR) 50 MG tablet Take 1 tablet (50 mg total) by mouth daily. 30 tablet 6   No current facility-administered medications for this visit.     Past Surgical History:  Procedure Laterality Date  . BACK SURGERY    . CARDIOVERSION N/A 10/06/2018   Procedure: CARDIOVERSION;  Surgeon: Jeremy Sark, MD;  [facility]: [facility];  Service: Cardiovascular;  Laterality: N/A;  . CHOLECYSTECTOMY N/A 10/30/2016   Procedure: LAPAROSCOPIC CHOLECYSTECTOMY;  Surgeon: Jeremy Signs, MD;  Location: [facility];  Service: General;  Laterality: N/A;  . COLONOSCOPY  N/A 07/25/2016   Dr. Gala Burgess: 9 mm tubular adenoma. 5 year surveillance   . ESOPHAGOGASTRODUODENOSCOPY  2009   EGD completed by Dr. Gala Burgess due to hematemesis. Findings of normal esophagus, tiny antral erosions, normal D1, D2  . ESOPHAGOGASTRODUODENOSCOPY N/A 05/27/2016   Dr. Gala Burgess: normal   . EXTERNAL FIXATION LEG Right 02/09/2015   Procedure: EXTERNAL FIXATION LEG;  Surgeon: Jeremy Butters, MD;  Location: [place];  Service: Orthopedics;  Laterality: Right;  . EXTERNAL FIXATION REMOVAL Right 02/14/2015   Procedure: REMOVAL EXTERNAL FIXATION LEG;  Surgeon: Jeremy Butters, MD;  Location: [facility];  Service: Orthopedics;  Laterality: Right;  . FEMUR IM NAIL Right  02/09/2015   Procedure: INTRAMEDULLARY (IM) RETROGRADE FEMORAL NAILING;  Surgeon: Jeremy Butters, MD;  Location: [facility];  Service: Orthopedics;  Laterality: Right;  . NOSE SURGERY    . ORIF ANKLE FRACTURE Right 02/14/2015   Procedure: OPEN REDUCTION INTERNAL FIXATION (ORIF) PILON FRACTURE;  Surgeon: Jeremy Butters, MD;  Location: [place] [facility];  Service: Orthopedics;  Laterality: Right;  . ORIF ULNAR FRACTURE Right 02/09/2015   Procedure: OPEN REDUCTION INTERNAL FIXATION (ORIF) ULNAR FRACTURE;  Surgeon: Jeremy Butters, MD;  Location: [place];  Service: Orthopedics;  Laterality: Right;  . POLYPECTOMY  07/25/2016   Procedure: POLYPECTOMY;  Surgeon: Jeremy Dolin, MD;  [facility];  Service: Endoscopy;;  colon     Allergies  Allergen Reactions  . Bee Venom Anaphylaxis    HONEY BEES  . Lisinopril Cough      Family History  Problem Relation Age of Onset  . Diabetes Mother   . COPD Mother   . CAD Father        CABG in his 38's  . Diabetes Sister   . Atrial fibrillation Brother   . Colon cancer Neg Hx      Social History Mr. Jeremy Burgess reports that he has never smoked. He has never used smokeless tobacco. Mr. Jeremy Burgess reports no history of alcohol use.   Review of Systems CONSTITUTIONAL: No weight loss, fever, chills, weakness or fatigue.  HEENT: Eyes: No visual loss, blurred vision, double vision or yellow sclerae.No hearing loss, sneezing, congestion, runny nose or sore throat.  SKIN: No rash or itching.  CARDIOVASCULAR:  RESPIRATORY: No shortness of breath, cough or sputum.  GASTROINTESTINAL: No anorexia, nausea, vomiting or diarrhea. No abdominal pain or blood.  GENITOURINARY: No burning on urination, no polyuria NEUROLOGICAL: No headache, dizziness, syncope, paralysis, ataxia, numbness or tingling in the extremities. No change in bowel or bladder control.  MUSCULOSKELETAL: No muscle, back pain, joint pain or stiffness.  LYMPHATICS: No enlarged nodes. No history of  splenectomy.  PSYCHIATRIC: No history of depression or anxiety.  ENDOCRINOLOGIC: No reports of sweating, cold or heat intolerance. No polyuria or polydipsia.  [name]   Physical Examination There were no vitals filed for this visit. There were no vitals filed for this visit.  Gen: resting comfortably, no acute distress HEENT: no scleral icterus, pupils equal round and reactive, no palptable cervical adenopathy,  CV Resp: Clear to auscultation bilaterally GI: abdomen is soft, non-tender, non-distended, normal bowel sounds, no hepatosplenomegaly MSK: extremities are warm, no edema.  Skin: warm, no rash Neuro:  no focal deficits Psych: appropriate affect   Diagnostic Studies  Echocardiogram: 08/2018  Study Conclusions - Left ventricle: The cavity size was normal. Wall thickness was normal. Systolic function was normal. The estimated ejection fraction was in the range of 60% to 65%. Wall motion was  normal; there were no regional wall motion abnormalities. The study is not technically sufficient to allow evaluation of LV diastolic function. - Aortic valve: Valve area (VTI): 2.83 cm^2. Valve area (Vmax): 2.61 cm^2. - Left atrium: The atrium was moderately to severely dilated. - Right atrium: The atrium was mildly dilated. - Atrial septum: No defect or patent foramen ovale was identified.   05/2020 nuclear stress  There was no ST segment deviation noted during stress.  The study is normal. There are no perfusion defects  This is a low risk study.  The left ventricular ejection fraction is normal (55-65%).    Assessment and Plan  1. Aflutter - no current symptoms, continue to monitor. Will need EKG at next office visit - continue anticoagulation, currently not requiring an av nodal agent  2. HTN - recent changes by pcp, I defer additional adjustments to pcp at this time      Jeremy Jeremy Burgess, M.D., F.A.C.C.

## 2020-06-27 NOTE — Patient Instructions (Signed)
Medication Instructions:  INCREASE Losartan 100 mg daily  *If you need a refill on your cardiac medications before your next appointment, please call your pharmacy*   Lab Work: None today If you have labs (blood work) drawn today and your tests are completely normal, you will receive your results only by: [name] MyChart Message (if you have MyChart) OR . A paper copy in the mail If you have any lab test that is abnormal or we need to change your treatment, we will call you to review the results.   Testing/Procedures: None today   Follow-Up: At [facility], you and your health needs are our priority.  As part of our continuing mission to provide you with exceptional heart care, we have created designated Provider Care Teams.  These Care Teams include your primary Cardiologist (physician) and Advanced Practice Providers (APPs -  Physician Assistants and Nurse Practitioners) who all work together to provide you with the care you need, when you need it.  We recommend signing up for the patient portal called "MyChart".  Sign up information is provided on this After Visit Summary.  MyChart is used to connect with patients for Virtual Visits (Telemedicine).  Patients are able to view lab/test results, encounter notes, upcoming appointments, etc.  Non-urgent messages can be sent to your provider as well.   To learn more about what you can do with MyChart, go to [email or web address].    Your next appointment:   6 month(s)  The format for your next appointment:   In Person  Provider:   Carlyle Dolly, M.D.  Other Instructions None      Thank you for choosing [facility] !

## 2020-06-27 NOTE — Progress Notes (Signed)
CSW referred to assist patient with obtaining a BP cuff. CSW contacted patient to inform cuff will be delivered to home. Patient grateful for support and assistance. CSW available as needed. Jackie Hoover Grewe, LCSW, CCSW-MCS [phone]  

## 2020-08-24 ENCOUNTER — Other Ambulatory Visit (INDEPENDENT_AMBULATORY_CARE_PROVIDER_SITE_OTHER): Payer: Self-pay | Admitting: Internal Medicine

## 2020-08-24 ENCOUNTER — Telehealth (INDEPENDENT_AMBULATORY_CARE_PROVIDER_SITE_OTHER): Payer: Self-pay

## 2020-08-24 DIAGNOSIS — E1165 Type 2 diabetes mellitus with hyperglycemia: Secondary | ICD-10-CM

## 2020-08-24 MED ORDER — INSULIN DETEMIR 100 UNIT/ML ~~LOC~~ SOLN: 25.0000 [IU] | Freq: Every day | SUBCUTANEOUS | 1 refills | Status: DC

## 2020-08-24 MED ORDER — JANUMET XR 50-1000 MG PO TB24: 2.0000 | ORAL_TABLET | Freq: Every day | ORAL | 0 refills | Status: DC

## 2020-08-24 NOTE — Telephone Encounter (Signed)
Patient called and left a voice message that he needs a refill of the following medications:  insulin detemir (LEVEMIR) 100 UNIT/ML injection  Last filled 05/23/2020, 10 mL with 1 refill  JANUMET XR 50-1000 MG TB24  Last filled 04/11/2020, # 180 with 0 refills  Last OV 05/23/2020  Next OV 08/30/2020

## 2020-08-30 ENCOUNTER — Other Ambulatory Visit: Payer: Self-pay

## 2020-08-30 ENCOUNTER — Encounter (INDEPENDENT_AMBULATORY_CARE_PROVIDER_SITE_OTHER): Payer: Self-pay | Admitting: Nurse Practitioner

## 2020-08-30 ENCOUNTER — Ambulatory Visit (INDEPENDENT_AMBULATORY_CARE_PROVIDER_SITE_OTHER): Payer: Medicaid Other | Admitting: Nurse Practitioner

## 2020-08-30 VITALS — BP 132/78 | HR 63 | Temp 97.8°F | Ht 71.0 in | Wt 251.0 lb

## 2020-08-30 DIAGNOSIS — E1165 Type 2 diabetes mellitus with hyperglycemia: Secondary | ICD-10-CM | POA: Diagnosis not present

## 2020-08-30 DIAGNOSIS — I4891 Unspecified atrial fibrillation: Secondary | ICD-10-CM

## 2020-08-30 DIAGNOSIS — E559 Vitamin D deficiency, unspecified: Secondary | ICD-10-CM | POA: Diagnosis not present

## 2020-08-30 DIAGNOSIS — I10 Essential (primary) hypertension: Secondary | ICD-10-CM | POA: Diagnosis not present

## 2020-08-30 NOTE — Progress Notes (Signed)
Subjective:  Patient ID: Jeremy Burgess, male    DOB: 30-Dec-1965  Age: 55 y.o. MRN: [id number]  CC:  Chief Complaint  Patient presents with  . Follow-up    Patient states he is doing well  . Diabetes  . Hypertension  . Other    Atrial fibrillation, vitamin D deficiency      HPI  This patient arrives today for the above.  Type diabetes: Last A1c was 8.1.  He continues on Janumet and Levemir.  He tells me he is feeling well and does check his blood sugars at home.  He tells me that his fasting blood sugar this morning was 129.  He is due to be checked for albuminuria, however tells me he does not think he can urinate today.  Hypertension: Continues on losartan and amlodipine.  He does follow-up with cardiology on a regular basis and they increased his losartan at last visit with them.  Atrial fibrillation: Continues on Eliquis and rate has been controlled ever since he had cardioversion attempted back in February 2020.  He denies any chest pain or heart palpitations.  Vitamin D deficiency: Last office visit his vitamin D level was increased to 15,000 daily, he reports continue to take his medication as recommended.  Serum check at last blood draw was 35.  Past Medical History:  Diagnosis Date  . Accidentally struck by falling tree 02/08/2015  . Acute blood loss anemia 02/09/2015  . Acute respiratory failure ([place]) 02/09/2015  . Acute respiratory failure ([place]) 02/09/2015  . Ankle fracture, right 02/09/2015  . Arthritis   . Asthma   . Atrial flutter ([place])    a. diagnosed in 08/2018 b. s/p DCCV in 09/2018 with return to NSR  . Chronic pain due to trauma   . Closed fracture of right ulna 02/08/2015  . Closed right pilon fracture 02/23/2015  . Diabetes mellitus   . Diabetes mellitus ([place])   . Femur fracture, right ([place]) 02/08/2015  . Heart murmur   . History of blood clots    DVT  . Hypertension   . Injury of hand, extensor tendon, right, sequela 09/01/2018  . Pneumothorax    . Postlaminectomy syndrome, lumbar region 11/13/2015  . Rib fracture   . Thrombocytopenia ([place]) 02/09/2015  . Traumatic closed displaced fracture of multiple ribs of right side 02/08/2015  . Traumatic hemopneumothorax 02/09/2015  . Upper GI bleed 05/26/2016  . Upper GI bleeding 05/27/2016   Trivial. EGD unremarkable.      Family History  Problem Relation Age of Onset  . Diabetes Mother   . COPD Mother   . CAD Father        CABG in his 61's  . Diabetes Sister   . Atrial fibrillation Brother   . Colon cancer Neg Hx     Social History   Social History Narrative       Single.Lives alone.On disability secondary to tree fall accident 2016.   Social History   Tobacco Use  . Smoking status: Never Smoker  . Smokeless tobacco: Never Used  Substance Use Topics  . Alcohol use: No    Comment: history of alcohol abuse but quit in 2008      Current Meds  Medication Sig  . amLODipine (NORVASC) 10 MG tablet Take 1 tablet (10 mg total) by mouth daily.  [name] apixaban (ELIQUIS) 5 MG TABS tablet Take 1 tablet (5 mg total) by mouth 2 (two) times daily.  . Cholecalciferol (VITAMIN D-3) 125  MCG (5000 UT) TABS Take 3 tablets by mouth daily.  [name] glucose blood (ACCU-CHEK GUIDE) test strip USE TO TEST TWICE DAILY.  [name] insulin detemir (LEVEMIR) 100 UNIT/ML injection Inject 0.25 mLs (25 Units total) into the skin daily.  . Insulin Syringe-Needle U-100 (SURE COMFORT INSULIN SYRINGE) 31G X 5/16" 1 ML MISC USE AS DIRECTED.  [name] JANUMET XR 50-1000 MG TB24 Take 2 tablets by mouth daily.  [name] losartan (COZAAR) 100 MG tablet Take 1 tablet (100 mg total) by mouth daily.    ROS:  Review of Systems  Constitutional: Negative for fever and weight loss.  Respiratory: Negative for cough, shortness of breath and wheezing.   Cardiovascular: Negative for chest pain and palpitations.  Gastrointestinal: Negative for abdominal pain and blood in stool.  Neurological: Negative for dizziness and headaches.      Objective:   Today's Vitals: BP 132/78   Pulse 63   Temp 97.8 F (36.6 C) (Temporal)   Ht 5' 11" (1.803 m)   Wt 251 lb (113.9 kg)   SpO2 98%   BMI 35.01 kg/m  Vitals with BMI 08/30/2020 06/27/2020 05/29/2020  Height 5' 11" 5' 11" 5' 11"  Weight 251 lbs 238 lbs 6 oz 247 lbs  BMI 35.02 60.63 01.60  Systolic 109 323 557  Diastolic 78 76 60  Pulse 63 69 68     Physical Exam Vitals reviewed.  Constitutional:      Appearance: Normal appearance.  HENT:     Head: Normocephalic and atraumatic.  Neck:     Vascular: No carotid bruit.  Cardiovascular:     Rate and Rhythm: Normal rate and regular rhythm.  Pulmonary:     Effort: Pulmonary effort is normal.     Breath sounds: Normal breath sounds.  Musculoskeletal:     Cervical back: Neck supple.  Skin:    General: Skin is warm and dry.  Neurological:     Mental Status: He is alert and oriented to person, place, and time.  Psychiatric:        Mood and Affect: Mood normal.        Behavior: Behavior normal.        Thought Content: Thought content normal.        Judgment: Judgment normal.          Assessment and Plan   1. Uncontrolled type 2 diabetes mellitus with hyperglycemia ([place])   2. Essential hypertension   3. Vitamin D deficiency disease   4. Atrial fibrillation with RVR ([place])      Plan: 1.  We will collect A1c today.  Continue on current medications as prescribed.  We did discuss possibly starting Jardiance if A1c remains above 7.0, he is very hesitant to start this at this time but would be willing to increase his insulin if necessary.  We will make changes pending A1c results. 2.  He will continue on current medications as prescribed for his hypertension. 3.  We will check serum vitamin D level today, in the meantime he will continue on his vitamin D supplement. 4.  Clinically appears to be in sinus rhythm today as heart rhythm was regular on exam.  He will continue on his Eliquis, rate within normal  limits today.   Tests ordered Orders Placed This Encounter  Procedures  . Hemoglobin A1c  . CMP with eGFR([facility])  . Vitamin D, 25-hydroxy      No orders of the defined types were placed in this encounter.   Patient to  follow-up in 3 months or sooner as needed.  Ailene Ards, NP

## 2020-08-31 ENCOUNTER — Other Ambulatory Visit (INDEPENDENT_AMBULATORY_CARE_PROVIDER_SITE_OTHER): Payer: Self-pay | Admitting: Internal Medicine

## 2020-08-31 ENCOUNTER — Telehealth (INDEPENDENT_AMBULATORY_CARE_PROVIDER_SITE_OTHER): Payer: Self-pay

## 2020-08-31 DIAGNOSIS — I4891 Unspecified atrial fibrillation: Secondary | ICD-10-CM

## 2020-08-31 LAB — COMPLETE METABOLIC PANEL WITH GFR
AG Ratio: 1.5 (calc) (ref 1.0–2.5)
ALT: 23 U/L (ref 9–46)
AST: 18 U/L (ref 10–35)
Albumin: 4.3 g/dL (ref 3.6–5.1)
Alkaline phosphatase (APISO): 52 U/L (ref 35–144)
BUN: 21 mg/dL (ref 7–25)
CO2: 28 mmol/L (ref 20–32)
Calcium: 9.5 mg/dL (ref 8.6–10.3)
Chloride: 104 mmol/L (ref 98–110)
Creat: 1.08 mg/dL (ref 0.70–1.33)
GFR, Est African American: 88 mL/min/{1.73_m2} (ref >=60)
GFR, Est Non African American: 76 mL/min/{1.73_m2} (ref >=60)
Globulin: 2.9 g/dL (calc) (ref 1.9–3.7)
Glucose, Bld: 132 mg/dL (ref 65–139)
Potassium: 4.8 mmol/L (ref 3.5–5.3)
Sodium: 139 mmol/L (ref 135–146)
Total Bilirubin: 0.5 mg/dL (ref 0.2–1.2)
Total Protein: 7.2 g/dL (ref 6.1–8.1)

## 2020-08-31 LAB — HEMOGLOBIN A1C
Hgb A1c MFr Bld: 7.9 % of total Hgb — ABNORMAL HIGH (ref <5.7)
Mean Plasma Glucose: 180 mg/dL
eAG (mmol/L): 10 mmol/L

## 2020-08-31 LAB — VITAMIN D 25 HYDROXY (VIT D DEFICIENCY, FRACTURES): Vit D, 25-Hydroxy: 57 ng/mL (ref 30–100)

## 2020-08-31 MED ORDER — APIXABAN 5 MG PO TABS: 5.0000 mg | ORAL_TABLET | Freq: Two times a day (BID) | ORAL | 3 refills | Status: DC

## 2020-08-31 NOTE — Telephone Encounter (Signed)
Patient called and stated that he needs a refill of the following medication:  apixaban (ELIQUIS) 5 MG TABS tablet  Last filled 05/23/2020, # 60 with 3 refills  Last OV was yesterday with Judson Roch  Next OV 11/30/2020

## 2020-08-31 NOTE — Telephone Encounter (Signed)
Called patient and let him know that his medication was sent to [facility]. Patient verbalized that this was the correct pharmacy.

## 2020-08-31 NOTE — Telephone Encounter (Signed)
Please let the patient know that I have sent this prescription to [facility] although other pharmacies were listed, hopefully this is the correct one.

## 2020-11-09 ENCOUNTER — Telehealth (INDEPENDENT_AMBULATORY_CARE_PROVIDER_SITE_OTHER): Payer: Self-pay | Admitting: Internal Medicine

## 2020-11-09 DIAGNOSIS — E1165 Type 2 diabetes mellitus with hyperglycemia: Secondary | ICD-10-CM

## 2020-11-09 MED ORDER — INSULIN DETEMIR 100 UNIT/ML ~~LOC~~ SOLN: 25.0000 [IU] | Freq: Every day | SUBCUTANEOUS | 1 refills | Status: DC

## 2020-11-09 NOTE — Telephone Encounter (Signed)
Refill sent to [facility].

## 2020-11-19 ENCOUNTER — Other Ambulatory Visit (INDEPENDENT_AMBULATORY_CARE_PROVIDER_SITE_OTHER): Payer: Self-pay | Admitting: Internal Medicine

## 2020-11-19 DIAGNOSIS — I10 Essential (primary) hypertension: Secondary | ICD-10-CM

## 2020-11-30 ENCOUNTER — Other Ambulatory Visit: Payer: Self-pay

## 2020-11-30 ENCOUNTER — Ambulatory Visit (INDEPENDENT_AMBULATORY_CARE_PROVIDER_SITE_OTHER): Payer: Medicaid Other | Admitting: Nurse Practitioner

## 2020-11-30 ENCOUNTER — Encounter (INDEPENDENT_AMBULATORY_CARE_PROVIDER_SITE_OTHER): Payer: Self-pay | Admitting: Nurse Practitioner

## 2020-11-30 VITALS — BP 128/76 | HR 55 | Temp 97.3°F | Ht 71.0 in | Wt 244.2 lb

## 2020-11-30 DIAGNOSIS — I4891 Unspecified atrial fibrillation: Secondary | ICD-10-CM | POA: Diagnosis not present

## 2020-11-30 DIAGNOSIS — E559 Vitamin D deficiency, unspecified: Secondary | ICD-10-CM | POA: Diagnosis not present

## 2020-11-30 DIAGNOSIS — E1165 Type 2 diabetes mellitus with hyperglycemia: Secondary | ICD-10-CM | POA: Diagnosis not present

## 2020-11-30 DIAGNOSIS — I10 Essential (primary) hypertension: Secondary | ICD-10-CM

## 2020-11-30 MED ORDER — JANUMET XR 50-1000 MG PO TB24: 2.0000 | ORAL_TABLET | Freq: Every day | ORAL | 0 refills | Status: DC

## 2020-11-30 NOTE — Progress Notes (Signed)
Subjective:  Patient ID: Jeremy Burgess, male    DOB: 1966-07-23  Age: 55 y.o. MRN: [id number]  CC:  Chief Complaint  Patient presents with  . Follow-up    Doing well, no concerns  . Hypertension  . Diabetes  . Atrial Fibrillation  . Other    Vitamin D deficiency      HPI  This patient arrives today for the above.  Hypertension: He continues on amlodipine and losartan.  Diabetes: He continues on Janumet and Levemir.  Last A1c was 7.9.  Atrial fibrillation: He has a history of A. fib with RVR.  He is not rate controlled due to bradycardia after cardioversion back in February 2020.  He continues on Eliquis.  Vitamin D deficiency: Last serum check showed serum level of 57 he continues taking 50,000 IUs of vitamin D3 daily.  Past Medical History:  Diagnosis Date  . Accidentally struck by falling tree 02/08/2015  . Acute blood loss anemia 02/09/2015  . Acute respiratory failure ([place]) 02/09/2015  . Acute respiratory failure ([place]) 02/09/2015  . Ankle fracture, right 02/09/2015  . Arthritis   . Asthma   . Atrial flutter ([place])    a. diagnosed in 08/2018 b. s/p DCCV in 09/2018 with return to NSR  . Chronic pain due to trauma   . Closed fracture of right ulna 02/08/2015  . Closed right pilon fracture 02/23/2015  . Diabetes mellitus   . Diabetes mellitus ([place])   . Femur fracture, right ([place]) 02/08/2015  . Heart murmur   . History of blood clots    DVT  . Hypertension   . Injury of hand, extensor tendon, right, sequela 09/01/2018  . Pneumothorax   . Postlaminectomy syndrome, lumbar region 11/13/2015  . Rib fracture   . Thrombocytopenia ([place]) 02/09/2015  . Traumatic closed displaced fracture of multiple ribs of right side 02/08/2015  . Traumatic hemopneumothorax 02/09/2015  . Upper GI bleed 05/26/2016  . Upper GI bleeding 05/27/2016   Trivial. EGD unremarkable.      Family History  Problem Relation Age of Onset  . Diabetes Mother   . COPD Mother   . CAD Father         CABG in his 56's  . Diabetes Sister   . Atrial fibrillation Brother   . Colon cancer Neg Hx     Social History   Social History Narrative       Single.Lives alone.On disability secondary to tree fall accident 2016.   Social History   Tobacco Use  . Smoking status: Never Smoker  . Smokeless tobacco: Never Used  Substance Use Topics  . Alcohol use: No    Comment: history of alcohol abuse but quit in 2008      Current Meds  Medication Sig  . amLODipine (NORVASC) 10 MG tablet Take 1 tablet by mouth once daily  . apixaban (ELIQUIS) 5 MG TABS tablet Take 1 tablet (5 mg total) by mouth 2 (two) times daily.  . Cholecalciferol (VITAMIN D-3) 125 MCG (5000 UT) TABS Take 3 tablets by mouth daily.  [name] glucose blood (ACCU-CHEK GUIDE) test strip USE TO TEST TWICE DAILY.  [name] insulin detemir (LEVEMIR) 100 UNIT/ML injection Inject 0.25 mLs (25 Units total) into the skin daily.  . Insulin Syringe-Needle U-100 (SURE COMFORT INSULIN SYRINGE) 31G X 5/16" 1 ML MISC USE AS DIRECTED.  . [DISCONTINUED] JANUMET XR 50-1000 MG TB24 Take 2 tablets by mouth daily.    ROS:  Review of Systems  Constitutional: Negative for fever.  Eyes: Negative for blurred vision.  Respiratory: Negative for shortness of breath.   Cardiovascular: Negative for chest pain and palpitations.     Objective:   Today's Vitals: BP 128/76   Pulse (!) 55   Temp (!) 97.3 F (36.3 C) (Temporal)   Ht '5\' 11"'  (1.803 m)   Wt 244 lb 3.2 oz (110.8 kg)   SpO2 97%   BMI 34.06 kg/m  Vitals with BMI 11/30/2020 08/30/2020 06/27/2020  Height '5\' 11"'  '5\' 11"'  '5\' 11"'   Weight 244 lbs 3 oz 251 lbs 238 lbs 6 oz  BMI 34.07 53.64 68.03  Systolic 212 248 250  Diastolic 76 78 76  Pulse 55 63 69     Physical Exam Vitals reviewed.  Constitutional:      Appearance: Normal appearance.  HENT:     Head: Normocephalic and atraumatic.  Cardiovascular:     Rate and Rhythm: Normal rate and regular rhythm.  Pulmonary:     Effort: Pulmonary  effort is normal.     Breath sounds: Normal breath sounds.  Musculoskeletal:     Cervical back: Neck supple.  Skin:    General: Skin is warm and dry.  Neurological:     Mental Status: He is alert and oriented to person, place, and time.  Psychiatric:        Mood and Affect: Mood normal.        Behavior: Behavior normal.        Thought Content: Thought content normal.        Judgment: Judgment normal.          Assessment and Plan   1. Essential hypertension   2. Uncontrolled type 2 diabetes mellitus with hyperglycemia ([place])   3. Vitamin D deficiency disease   4. Atrial fibrillation with RVR ([place])      Plan: 1.  Blood pressure at goal currently.  Continue on his current medications as prescribed. 2.  We will check A1c today as well as check urine for microalbuminuria.  We did discuss that his A1c is still above 7.0 we will increase his Levemir, and patient is agreeable with this. 3.  We will check vitamin D serum level today. 4.  He appears to be clinically in sinus rhythm today.  He will continue taking his Eliquis as prescribed.  Rate is well controlled currently.   Tests ordered Orders Placed This Encounter  Procedures  . Hemoglobin A1c  . CMP with eGFR([facility])  . Vitamin D, 25-hydroxy  . Microalbumin/Creatinine Ratio, Urine      Meds ordered this encounter  Medications  . JANUMET XR 50-1000 MG TB24    Sig: Take 2 tablets by mouth daily.    Dispense:  180 tablet    Refill:  0    Order Specific Question:   Supervising Provider    Answer:   Doree Albee [[id number]]    Patient to follow-up in 3 months or sooner as needed.  Ailene Ards, NP

## 2020-12-01 LAB — COMPLETE METABOLIC PANEL WITH GFR
AG Ratio: 1.6 (calc) (ref 1.0–2.5)
ALT: 15 U/L (ref 9–46)
AST: 18 U/L (ref 10–35)
Albumin: 4.2 g/dL (ref 3.6–5.1)
Alkaline phosphatase (APISO): 47 U/L (ref 35–144)
BUN: 20 mg/dL (ref 7–25)
CO2: 24 mmol/L (ref 20–32)
Calcium: 9.3 mg/dL (ref 8.6–10.3)
Chloride: 103 mmol/L (ref 98–110)
Creat: 1.1 mg/dL (ref 0.70–1.33)
GFR, Est African American: 86 mL/min/{1.73_m2} (ref >=60)
GFR, Est Non African American: 74 mL/min/{1.73_m2} (ref >=60)
Globulin: 2.6 g/dL (calc) (ref 1.9–3.7)
Glucose, Bld: 104 mg/dL — ABNORMAL HIGH (ref 65–99)
Potassium: 4.7 mmol/L (ref 3.5–5.3)
Sodium: 137 mmol/L (ref 135–146)
Total Bilirubin: 0.6 mg/dL (ref 0.2–1.2)
Total Protein: 6.8 g/dL (ref 6.1–8.1)

## 2020-12-01 LAB — MICROALBUMIN / CREATININE URINE RATIO
Creatinine, Urine: 37 mg/dL (ref 20–320)
Microalb Creat Ratio: 30 mcg/mg creat — ABNORMAL HIGH (ref <30)
Microalb, Ur: 1.1 mg/dL

## 2020-12-01 LAB — HEMOGLOBIN A1C
Hgb A1c MFr Bld: 7.7 % of total Hgb — ABNORMAL HIGH (ref <5.7)
Mean Plasma Glucose: 174 mg/dL
eAG (mmol/L): 9.7 mmol/L

## 2020-12-01 LAB — VITAMIN D 25 HYDROXY (VIT D DEFICIENCY, FRACTURES): Vit D, 25-Hydroxy: 91 ng/mL (ref 30–100)

## 2021-01-10 ENCOUNTER — Ambulatory Visit: Payer: Medicaid Other | Admitting: Cardiology

## 2021-02-19 ENCOUNTER — Telehealth (INDEPENDENT_AMBULATORY_CARE_PROVIDER_SITE_OTHER): Payer: Self-pay

## 2021-02-19 DIAGNOSIS — I4891 Unspecified atrial fibrillation: Secondary | ICD-10-CM

## 2021-02-19 DIAGNOSIS — E1165 Type 2 diabetes mellitus with hyperglycemia: Secondary | ICD-10-CM

## 2021-02-19 MED ORDER — APIXABAN 5 MG PO TABS: 5.0000 mg | ORAL_TABLET | Freq: Two times a day (BID) | ORAL | 3 refills | Status: DC

## 2021-02-19 MED ORDER — INSULIN DETEMIR 100 UNIT/ML ~~LOC~~ SOLN: 25.0000 [IU] | Freq: Every day | SUBCUTANEOUS | 1 refills | Status: DC

## 2021-02-19 NOTE — Telephone Encounter (Signed)
Sent refills to [facility] in [place]

## 2021-02-19 NOTE — Telephone Encounter (Signed)
Patient called and requested a refill sent to [facility] in [place] for the following medications:  apixaban (ELIQUIS) 5 MG TABS tablet  Last filled 08/31/2020, # 60 with 3 refills  insulin detemir (LEVEMIR) 100 UNIT/ML injection  Last filled 11/09/2020, # 10 mL with 1 refills  Last OV 11/30/2020  Next OV 02/28/2021

## 2021-02-20 ENCOUNTER — Ambulatory Visit (INDEPENDENT_AMBULATORY_CARE_PROVIDER_SITE_OTHER): Payer: Medicaid Other | Admitting: Cardiology

## 2021-02-20 ENCOUNTER — Other Ambulatory Visit: Payer: Self-pay

## 2021-02-20 ENCOUNTER — Encounter: Payer: Self-pay | Admitting: Cardiology

## 2021-02-20 VITALS — BP 136/60 | HR 62 | Ht 71.0 in | Wt 238.0 lb

## 2021-02-20 DIAGNOSIS — E119 Type 2 diabetes mellitus without complications: Secondary | ICD-10-CM

## 2021-02-20 DIAGNOSIS — I10 Essential (primary) hypertension: Secondary | ICD-10-CM | POA: Diagnosis not present

## 2021-02-20 DIAGNOSIS — Z794 Long term (current) use of insulin: Secondary | ICD-10-CM | POA: Diagnosis not present

## 2021-02-20 DIAGNOSIS — I483 Typical atrial flutter: Secondary | ICD-10-CM

## 2021-02-20 NOTE — Progress Notes (Signed)
Clinical Summary Mr. Canion is a 55 y.o.maleseen today for follow up of the following medical problems.   1. Atrial flutter - new diagnosis during Jan 2020 admission - at that time started on anticoag, later cardioverted 10/06/2018 - during 10/23/2018 visit was still in [place] - currently on eliquis. Cardizem 120 stopped after cardioversion due to some low heart rates.   - recent palpitations.  - compliant with meds, no bleeding on eliquis.    2. HTN - recent changes by pcp. Lisinopril due to cough. Started on norvasc  - compliant with meds   3. Chest pain - 05/2020 visit with PA Leonides Sake reported left sided chest pain - 05/2020 nuclear stress: no ischemia.    -no recent symptoms.      4. DM2 - 2020 LDL was 73, has not been on therapy.     Past Medical History:  Diagnosis Date   Accidentally struck by falling tree 02/08/2015   Acute blood loss anemia 02/09/2015   Acute respiratory failure ([place]) 02/09/2015   Acute respiratory failure ([place]) 02/09/2015   Ankle fracture, right 02/09/2015   Arthritis    Asthma    Atrial flutter ([place])    a. diagnosed in 08/2018 b. s/p DCCV in 09/2018 with return to NSR   Chronic pain due to trauma    Closed fracture of right ulna 02/08/2015   Closed right pilon fracture 02/23/2015   Diabetes mellitus    Diabetes mellitus ([place])    Femur fracture, right ([place]) 02/08/2015   Heart murmur    History of blood clots    DVT   Hypertension    Injury of hand, extensor tendon, right, sequela 09/01/2018   Pneumothorax    Postlaminectomy syndrome, lumbar region 11/13/2015   Rib fracture    Thrombocytopenia ([place]) 02/09/2015   Traumatic closed displaced fracture of multiple ribs of right side 02/08/2015   Traumatic hemopneumothorax 02/09/2015   Upper GI bleed 05/26/2016   Upper GI bleeding 05/27/2016   Trivial. EGD unremarkable.     Allergies  Allergen Reactions   Bee Venom Anaphylaxis    HONEY BEES   Lisinopril Cough     Current Outpatient  Medications  Medication Sig Dispense Refill   amLODipine (NORVASC) 10 MG tablet Take 1 tablet by mouth once daily 90 tablet 0   apixaban (ELIQUIS) 5 MG TABS tablet Take 1 tablet (5 mg total) by mouth 2 (two) times daily. 60 tablet 3   Cholecalciferol (VITAMIN D-3) 125 MCG (5000 UT) TABS Take 3 tablets by mouth daily.     glucose blood (ACCU-CHEK GUIDE) test strip USE TO TEST TWICE DAILY. 200 strip 11   insulin detemir (LEVEMIR) 100 UNIT/ML injection Inject 0.25 mLs (25 Units total) into the skin daily. 10 mL 1   Insulin Syringe-Needle U-100 (SURE COMFORT INSULIN SYRINGE) 31G X 5/16" 1 ML MISC USE AS DIRECTED. 100 each 11   JANUMET XR 50-1000 MG TB24 Take 2 tablets by mouth daily. 180 tablet 0   losartan (COZAAR) 100 MG tablet Take 1 tablet (100 mg total) by mouth daily. 90 tablet 3   No current facility-administered medications for this visit.     Past Surgical History:  Procedure Laterality Date   BACK SURGERY     CARDIOVERSION N/A 10/06/2018   Procedure: CARDIOVERSION;  Surgeon: [name], MD;  Location: [facility];  Service: Cardiovascular;  Laterality: N/A;   CHOLECYSTECTOMY N/A 10/30/2016   Procedure: LAPAROSCOPIC CHOLECYSTECTOMY;  Surgeon: [name], MD;  Location:  [facility];  Service: General;  Laterality: N/A;   COLONOSCOPY N/A 07/25/2016   Dr. [name]: 9 mm tubular adenoma. 5 year surveillance    ESOPHAGOGASTRODUODENOSCOPY  2009   EGD completed by Dr. [name] due to hematemesis. Findings of normal esophagus, tiny antral erosions, normal D1, D2   ESOPHAGOGASTRODUODENOSCOPY N/A 05/27/2016   Dr. [name]: normal    EXTERNAL FIXATION LEG Right 02/09/2015   Procedure: EXTERNAL FIXATION LEG;  Surgeon: [name], MD;  Location: [facility];  Service: Orthopedics;  Laterality: Right;   EXTERNAL FIXATION REMOVAL Right 02/14/2015   Procedure: REMOVAL EXTERNAL FIXATION LEG;  Surgeon: [name], MD;  Location: [facility];  Service: Orthopedics;  Laterality: Right;   FEMUR IM NAIL Right  02/09/2015   Procedure: INTRAMEDULLARY (IM) RETROGRADE FEMORAL NAILING;  Surgeon: [name], MD;  Location: [facility];  Service: Orthopedics;  Laterality: Right;   NOSE SURGERY     ORIF ANKLE FRACTURE Right 02/14/2015   Procedure: OPEN REDUCTION INTERNAL FIXATION (ORIF) PILON FRACTURE;  Surgeon: [name], MD;  Location: [facility];  Service: Orthopedics;  Laterality: Right;   ORIF ULNAR FRACTURE Right 02/09/2015   Procedure: OPEN REDUCTION INTERNAL FIXATION (ORIF) ULNAR FRACTURE;  Surgeon: [name], MD;  Location: [facility];  Service: Orthopedics;  Laterality: Right;   POLYPECTOMY  07/25/2016   Procedure: POLYPECTOMY;  Surgeon: [name], MD;  Location: [facility];  Service: Endoscopy;;  colon     Allergies  Allergen Reactions   Bee Venom Anaphylaxis    HONEY BEES   Lisinopril Cough      Family History  Problem Relation Age of Onset   Diabetes Mother    COPD Mother    CAD Father        CABG in his 22's   Diabetes Sister    Atrial fibrillation Brother    Colon cancer Neg Hx      Social History Mr. Hattabaugh reports that he has never smoked. He has never used smokeless tobacco. Mr. Schreck reports no history of alcohol use.   Review of Systems CONSTITUTIONAL: No weight loss, fever, chills, weakness or fatigue.  HEENT: Eyes: No visual loss, blurred vision, double vision or yellow sclerae.No hearing loss, sneezing, congestion, runny nose or sore throat.  SKIN: No rash or itching.  CARDIOVASCULAR: per hpi RESPIRATORY: No shortness of breath, cough or sputum.  GASTROINTESTINAL: No anorexia, nausea, vomiting or diarrhea. No abdominal pain or blood.  GENITOURINARY: No burning on urination, no polyuria NEUROLOGICAL: No headache, dizziness, syncope, paralysis, ataxia, numbness or tingling in the extremities. No change in bowel or bladder control.  MUSCULOSKELETAL: No muscle, back pain, joint pain or stiffness.  LYMPHATICS: No enlarged nodes. No history of splenectomy.   PSYCHIATRIC: No history of depression or anxiety.  ENDOCRINOLOGIC: No reports of sweating, cold or heat intolerance. No polyuria or polydipsia.  [name]   Physical Examination Today's Vitals   02/20/21 0800  BP: 136/60  Pulse: 62  SpO2: 98%  Weight: 238 lb (108 kg)  Height: 5\' 11"  (1.803 m)   Body mass index is 33.19 kg/m.  Gen: resting comfortably, no acute distress HEENT: no scleral icterus, pupils equal round and reactive, no palptable cervical adenopathy,  CV: RRR, no m/r/g, no jvd Resp: Clear to auscultation bilaterally GI: abdomen is soft, non-tender, non-distended, normal bowel sounds, no hepatosplenomegaly MSK: extremities are warm, no edema.  Skin: warm, no rash Neuro:  no focal deficits Psych: appropriate affect   Diagnostic Studies Echocardiogram:  08/2018   Study Conclusions - Left ventricle: The cavity size was normal. Wall thickness was   normal. Systolic function was normal. The estimated ejection   fraction was in the range of 60% to 65%. Wall motion was normal;   there were no regional wall motion abnormalities. The study is   not technically sufficient to allow evaluation of LV diastolic   function. - Aortic valve: Valve area (VTI): 2.83 cm^2. Valve area (Vmax):   2.61 cm^2. - Left atrium: The atrium was moderately to severely dilated. - Right atrium: The atrium was mildly dilated. - Atrial septum: No defect or patent foramen ovale was identified.     05/2020 nuclear stress There was no ST segment deviation noted during stress. The study is normal. There are no perfusion defects This is a low risk study. The left ventricular ejection fraction is normal (55-65%).    Assessment and Plan   Aflutter - doing well, no recent recurrence since his DCCV a few years ago - continue current meds including anticoag   2. HTN - essentially at goal, continue current meds  3. DM2 - could consider low to moderate dose statin in setting of DM2 based on lipid  guidelines even though his LDL has not been significantlty elevated.     Arnoldo Lenis, M.D.

## 2021-02-20 NOTE — Patient Instructions (Signed)

## 2021-02-28 ENCOUNTER — Ambulatory Visit (INDEPENDENT_AMBULATORY_CARE_PROVIDER_SITE_OTHER): Payer: Medicaid Other | Admitting: Internal Medicine

## 2021-02-28 ENCOUNTER — Other Ambulatory Visit: Payer: Self-pay

## 2021-02-28 ENCOUNTER — Encounter (INDEPENDENT_AMBULATORY_CARE_PROVIDER_SITE_OTHER): Payer: Self-pay | Admitting: Internal Medicine

## 2021-02-28 VITALS — BP 122/66 | HR 60 | Temp 97.8°F | Ht 71.0 in | Wt 238.8 lb

## 2021-02-28 DIAGNOSIS — I10 Essential (primary) hypertension: Secondary | ICD-10-CM

## 2021-02-28 DIAGNOSIS — E1165 Type 2 diabetes mellitus with hyperglycemia: Secondary | ICD-10-CM | POA: Diagnosis not present

## 2021-02-28 DIAGNOSIS — I4891 Unspecified atrial fibrillation: Secondary | ICD-10-CM

## 2021-02-28 MED ORDER — AMLODIPINE BESYLATE 10 MG PO TABS: 10.0000 mg | ORAL_TABLET | Freq: Every day | ORAL | 0 refills | Status: DC

## 2021-02-28 MED ORDER — LOSARTAN POTASSIUM 100 MG PO TABS: 100.0000 mg | ORAL_TABLET | Freq: Every day | ORAL | 0 refills | Status: DC

## 2021-02-28 MED ORDER — JANUMET XR 50-1000 MG PO TB24: 2.0000 | ORAL_TABLET | Freq: Every day | ORAL | 0 refills | Status: DC

## 2021-02-28 NOTE — Progress Notes (Signed)
Metrics: Intervention Frequency ACO  Documented Smoking Status Yearly  Screened one or more times in 24 months  Cessation Counseling or  Active cessation medication Past 24 months  Past 24 months   Guideline developer: UpToDate (See UpToDate for funding source) Date Released: 2014       Wellness Office Visit  Subjective:  Patient ID: Jeremy Burgess, male    DOB: [birth year]  Age: 55 y.o. MRN: [id number]  CC:  HPI   Past Medical History:  Diagnosis Date   Accidentally struck by falling tree 02/08/2015   Acute blood loss anemia 02/09/2015   Acute respiratory failure ([place]) 02/09/2015   Acute respiratory failure ([place]) 02/09/2015   Ankle fracture, right 02/09/2015   Arthritis    Asthma    Atrial flutter ([place])    a. diagnosed in 08/2018 b. s/p DCCV in 09/2018 with return to NSR   Chronic pain due to trauma    Closed fracture of right ulna 02/08/2015   Closed right pilon fracture 02/23/2015   Diabetes mellitus    Diabetes mellitus ([place])    Femur fracture, right ([place]) 02/08/2015   Heart murmur    History of blood clots    DVT   Hypertension    Injury of hand, extensor tendon, right, sequela 09/01/2018   Pneumothorax    Postlaminectomy syndrome, lumbar region 11/13/2015   Rib fracture    Thrombocytopenia ([place]) 02/09/2015   Traumatic closed displaced fracture of multiple ribs of right side 02/08/2015   Traumatic hemopneumothorax 02/09/2015   Upper GI bleed 05/26/2016   Upper GI bleeding 05/27/2016   Trivial. EGD unremarkable.   Past Surgical History:  Procedure Laterality Date   BACK SURGERY     CARDIOVERSION N/A 10/06/2018   Procedure: CARDIOVERSION;  Surgeon: [name], MD;  Location: [facility];  Service: Cardiovascular;  Laterality: N/A;   CHOLECYSTECTOMY N/A 10/30/2016   Procedure: LAPAROSCOPIC CHOLECYSTECTOMY;  Surgeon: [name], MD;  Location: [facility];  Service: General;  Laterality: N/A;   COLONOSCOPY N/A 07/25/2016   Dr. [name]: 9 mm tubular adenoma. 5 year  surveillance    ESOPHAGOGASTRODUODENOSCOPY  2009   EGD completed by Dr. [name] due to hematemesis. Findings of normal esophagus, tiny antral erosions, normal D1, D2   ESOPHAGOGASTRODUODENOSCOPY N/A 05/27/2016   Dr. [name]: normal    EXTERNAL FIXATION LEG Right 02/09/2015   Procedure: EXTERNAL FIXATION LEG;  Surgeon: [name], MD;  Location: [place];  Service: Orthopedics;  Laterality: Right;   EXTERNAL FIXATION REMOVAL Right 02/14/2015   Procedure: REMOVAL EXTERNAL FIXATION LEG;  Surgeon: [name], MD;  Location: [place];  Service: Orthopedics;  Laterality: Right;   FEMUR IM NAIL Right 02/09/2015   Procedure: INTRAMEDULLARY (IM) RETROGRADE FEMORAL NAILING;  Surgeon: [name], MD;  Location: [place];  Service: Orthopedics;  Laterality: Right;   NOSE SURGERY     ORIF ANKLE FRACTURE Right 02/14/2015   Procedure: OPEN REDUCTION INTERNAL FIXATION (ORIF) PILON FRACTURE;  Surgeon: [name], MD;  Location: [place];  Service: Orthopedics;  Laterality: Right;   ORIF ULNAR FRACTURE Right 02/09/2015   Procedure: OPEN REDUCTION INTERNAL FIXATION (ORIF) ULNAR FRACTURE;  Surgeon: [name], MD;  Location: [place];  Service: Orthopedics;  Laterality: Right;   POLYPECTOMY  07/25/2016   Procedure: POLYPECTOMY;  Surgeon: [name], MD;  [facility];  Service: Endoscopy;;  colon     Family History  Problem Relation Age of Onset   Diabetes Mother  COPD Mother    CAD Father        CABG in his 12's   Diabetes Sister    Atrial fibrillation Brother    Colon cancer Neg Hx     Social History   Social History Narrative       Single.Lives alone.On disability secondary to tree fall accident 2016.   Social History   Tobacco Use   Smoking status: Never   Smokeless tobacco: Never  Substance Use Topics   Alcohol use: No    Comment: history of alcohol abuse but quit in 2008     Current Meds  Medication Sig   apixaban (ELIQUIS) 5 MG TABS tablet Take 1 tablet (5 mg  total) by mouth 2 (two) times daily.   Cholecalciferol (VITAMIN D-3) 125 MCG (5000 UT) TABS Take 3 tablets by mouth daily.   glucose blood (ACCU-CHEK GUIDE) test strip USE TO TEST TWICE DAILY.   insulin detemir (LEVEMIR) 100 UNIT/ML injection Inject 0.25 mLs (25 Units total) into the skin daily.   Insulin Syringe-Needle U-100 (SURE COMFORT INSULIN SYRINGE) 31G X 5/16" 1 ML MISC USE AS DIRECTED.   [DISCONTINUED] amLODipine (NORVASC) 10 MG tablet Take 1 tablet by mouth once daily   [DISCONTINUED] JANUMET XR 50-1000 MG TB24 Take 2 tablets by mouth daily.     [place] Office Visit from 11/30/2020 in [facility]  PHQ-9 Total Score 0       Objective:   Today's Vitals: BP 122/66   Pulse 60   Temp 97.8 F (36.6 C) (Temporal)   Ht 5\' 11"  (1.803 m)   Wt 238 lb 12.8 oz (108.3 kg)   SpO2 96%   BMI 33.31 kg/m  Vitals with BMI 02/28/2021 02/20/2021 11/30/2020  Height 5\' 11"  5\' 11"  5\' 11"   Weight 238 lbs 13 oz 238 lbs 244 lbs 3 oz  BMI 33.32 27.03 50.09  Systolic [phone]  Diastolic 66 60 76  Pulse 60 62 55     Physical Exam       Assessment   1. Uncontrolled type 2 diabetes mellitus with hyperglycemia ([place])   2. Atrial fibrillation with RVR ([place])   3. Essential hypertension       Tests ordered Orders Placed This Encounter  Procedures   Basic metabolic panel   Hemoglobin A1c      Plan:    Meds ordered this encounter  Medications   amLODipine (NORVASC) 10 MG tablet    Sig: Take 1 tablet (10 mg total) by mouth daily.    Dispense:  90 tablet    Refill:  0   losartan (COZAAR) 100 MG tablet    Sig: Take 1 tablet (100 mg total) by mouth daily.    Dispense:  90 tablet    Refill:  0    06/27/20 dose increased to 100 mg   JANUMET XR 50-1000 MG TB24    Sig: Take 2 tablets by mouth daily.    Dispense:  180 tablet    Refill:  0     Laurren Lepkowski Luther Parody, MD

## 2021-02-28 NOTE — Progress Notes (Signed)
Metrics: Intervention Frequency ACO  Documented Smoking Status Yearly  Screened one or more times in 24 months  Cessation Counseling or  Active cessation medication Past 24 months  Past 24 months   Guideline developer: UpToDate (See UpToDate for funding source) Date Released: 2014       Wellness Office Visit  Subjective:  Patient ID: Jeremy Burgess, male    DOB: July 06, 1966  Age: 55 y.o. MRN: [id number]  CC: This man comes in for follow-up of diabetes, hypertension, vitamin D deficiency. HPI  Is doing reasonably well.  He does have a history of atrial flutter and he had cardioversion and apparently he has been in sinus rhythm for some time.  He continues to take Eliquis. Continues on Janumet twice a day as well as insulin for his diabetes. He continues on amlodipine and losartan for his hypertension. He denies any chest pain, palpitations or limb weakness. Past Medical History:  Diagnosis Date   Accidentally struck by falling tree 02/08/2015   Acute blood loss anemia 02/09/2015   Acute respiratory failure ([place]) 02/09/2015   Acute respiratory failure ([place]) 02/09/2015   Ankle fracture, right 02/09/2015   Arthritis    Asthma    Atrial flutter ([place])    a. diagnosed in 08/2018 b. s/p DCCV in 09/2018 with return to NSR   Chronic pain due to trauma    Closed fracture of right ulna 02/08/2015   Closed right pilon fracture 02/23/2015   Diabetes mellitus    Diabetes mellitus ([place])    Femur fracture, right ([place]) 02/08/2015   Heart murmur    History of blood clots    DVT   Hypertension    Injury of hand, extensor tendon, right, sequela 09/01/2018   Pneumothorax    Postlaminectomy syndrome, lumbar region 11/13/2015   Rib fracture    Thrombocytopenia ([place]) 02/09/2015   Traumatic closed displaced fracture of multiple ribs of right side 02/08/2015   Traumatic hemopneumothorax 02/09/2015   Upper GI bleed 05/26/2016   Upper GI bleeding 05/27/2016   Trivial. EGD unremarkable.   Past Surgical  History:  Procedure Laterality Date   BACK SURGERY     CARDIOVERSION N/A 10/06/2018   Procedure: CARDIOVERSION;  Surgeon: [name], MD;  [facility];  Service: Cardiovascular;  Laterality: N/A;   CHOLECYSTECTOMY N/A 10/30/2016   Procedure: LAPAROSCOPIC CHOLECYSTECTOMY;  Surgeon: [name], MD;  Location[facility];  Service: General;  Laterality: N/A;   COLONOSCOPY N/A 07/25/2016   Dr. [name]: 9 mm tubular adenoma. 5 year surveillance    ESOPHAGOGASTRODUODENOSCOPY  2009   EGD completed by Dr. [name] due to hematemesis. Findings of normal esophagus, tiny antral erosions, normal D1, D2   ESOPHAGOGASTRODUODENOSCOPY N/A 05/27/2016   Dr. [name]: normal    EXTERNAL FIXATION LEG Right 02/09/2015   Procedure: EXTERNAL FIXATION LEG;  Surgeon: [name], MD;  Location: [place];  Service: Orthopedics;  Laterality: Right;   EXTERNAL FIXATION REMOVAL Right 02/14/2015   Procedure: REMOVAL EXTERNAL FIXATION LEG;  Surgeon: [name], MD;  Location: [place];  Service: Orthopedics;  Laterality: Right;   FEMUR IM NAIL Right 02/09/2015   Procedure: INTRAMEDULLARY (IM) RETROGRADE FEMORAL NAILING;  Surgeon: [name], MD;  Location: [place];  Service: Orthopedics;  Laterality: Right;   NOSE SURGERY     ORIF ANKLE FRACTURE Right 02/14/2015   Procedure: OPEN REDUCTION INTERNAL FIXATION (ORIF) PILON FRACTURE;  Surgeon: [name], MD;  Location: [place];  Service: Orthopedics;  Laterality: Right;  ORIF ULNAR FRACTURE Right 02/09/2015   Procedure: OPEN REDUCTION INTERNAL FIXATION (ORIF) ULNAR FRACTURE;  Surgeon: [name], MD;  Location: [place];  Service: Orthopedics;  Laterality: Right;   POLYPECTOMY  07/25/2016   Procedure: POLYPECTOMY;  Surgeon: [name], MD;  [facility];  Service: Endoscopy;;  colon     Family History  Problem Relation Age of Onset   Diabetes Mother    COPD Mother    CAD Father        CABG in his 26's   Diabetes Sister    Atrial  fibrillation Brother    Colon cancer Neg Hx     Social History   Social History Narrative       Single.Lives alone.On disability secondary to tree fall accident 2016.   Social History   Tobacco Use   Smoking status: Never   Smokeless tobacco: Never  Substance Use Topics   Alcohol use: No    Comment: history of alcohol abuse but quit in 2008     Current Meds  Medication Sig   apixaban (ELIQUIS) 5 MG TABS tablet Take 1 tablet (5 mg total) by mouth 2 (two) times daily.   Cholecalciferol (VITAMIN D-3) 125 MCG (5000 UT) TABS Take 3 tablets by mouth daily.   glucose blood (ACCU-CHEK GUIDE) test strip USE TO TEST TWICE DAILY.   insulin detemir (LEVEMIR) 100 UNIT/ML injection Inject 0.25 mLs (25 Units total) into the skin daily.   Insulin Syringe-Needle U-100 (SURE COMFORT INSULIN SYRINGE) 31G X 5/16" 1 ML MISC USE AS DIRECTED.   [DISCONTINUED] amLODipine (NORVASC) 10 MG tablet Take 1 tablet by mouth once daily   [DISCONTINUED] JANUMET XR 50-1000 MG TB24 Take 2 tablets by mouth daily.     [place] Office Visit from 11/30/2020 in [facility]  PHQ-9 Total Score 0       Objective:   Today's Vitals: BP 122/66   Pulse 60   Temp 97.8 F (36.6 C) (Temporal)   Ht 5\' 11"  (1.803 m)   Wt 238 lb 12.8 oz (108.3 kg)   SpO2 96%   BMI 33.31 kg/m  Vitals with BMI 02/28/2021 02/20/2021 11/30/2020  Height 5\' 11"  5\' 11"  5\' 11"   Weight 238 lbs 13 oz 238 lbs 244 lbs 3 oz  BMI 33.32 81.27 51.70  Systolic [phone]  Diastolic 66 60 76  Pulse 60 62 55     Physical Exam  Looks systemically well.  Blood pressure is excellent.  Pulse is steady.     Assessment   1. Uncontrolled type 2 diabetes mellitus with hyperglycemia ([place])   2. Atrial fibrillation with RVR ([place])   3. Essential hypertension       Tests ordered Orders Placed This Encounter  Procedures   Basic metabolic panel   Hemoglobin A1c     Plan: 1.  Continue with Janumet and insulin for his diabetes  and we will check an A1c. 2.  Continue with losartan and amlodipine and we will check renal function. 3.  Further recommendations will depend on results and I will see him in 3 months time for follow-up    Meds ordered this encounter  Medications   amLODipine (NORVASC) 10 MG tablet    Sig: Take 1 tablet (10 mg total) by mouth daily.    Dispense:  90 tablet    Refill:  0   losartan (COZAAR) 100 MG tablet    Sig: Take 1 tablet (100 mg total) by  mouth daily.    Dispense:  90 tablet    Refill:  0    06/27/20 dose increased to 100 mg   JANUMET XR 50-1000 MG TB24    Sig: Take 2 tablets by mouth daily.    Dispense:  180 tablet    Refill:  0    Marjani Kobel Luther Parody, MD

## 2021-03-01 LAB — HEMOGLOBIN A1C
Hgb A1c MFr Bld: 7.6 % of total Hgb — ABNORMAL HIGH (ref <5.7)
Mean Plasma Glucose: 171 mg/dL
eAG (mmol/L): 9.5 mmol/L

## 2021-03-01 LAB — BASIC METABOLIC PANEL
BUN/Creatinine Ratio: 24 (calc) — ABNORMAL HIGH (ref 6–22)
BUN: 30 mg/dL — ABNORMAL HIGH (ref 7–25)
CO2: 24 mmol/L (ref 20–32)
Calcium: 9.7 mg/dL (ref 8.6–10.3)
Chloride: 108 mmol/L (ref 98–110)
Creat: 1.26 mg/dL (ref 0.70–1.30)
Glucose, Bld: 118 mg/dL — ABNORMAL HIGH (ref 65–99)
Potassium: 5 mmol/L (ref 3.5–5.3)
Sodium: 139 mmol/L (ref 135–146)

## 2021-03-22 ENCOUNTER — Telehealth (INDEPENDENT_AMBULATORY_CARE_PROVIDER_SITE_OTHER): Payer: Self-pay | Admitting: Nurse Practitioner

## 2021-03-22 DIAGNOSIS — E1165 Type 2 diabetes mellitus with hyperglycemia: Secondary | ICD-10-CM

## 2021-03-22 DIAGNOSIS — I10 Essential (primary) hypertension: Secondary | ICD-10-CM

## 2021-03-22 MED ORDER — "INSULIN SYRINGE-NEEDLE U-100 31G X 5/16"" 1 ML MISC": 11 refills | Status: DC

## 2021-03-22 MED ORDER — LOSARTAN POTASSIUM 100 MG PO TABS: 100.0000 mg | ORAL_TABLET | Freq: Every day | ORAL | 0 refills | Status: DC

## 2021-03-22 MED ORDER — INSULIN DETEMIR 100 UNIT/ML ~~LOC~~ SOLN: 25.0000 [IU] | Freq: Every day | SUBCUTANEOUS | 1 refills | Status: DC

## 2021-03-22 NOTE — Telephone Encounter (Signed)
Refills approved and sent to pharmacy

## 2021-06-04 ENCOUNTER — Ambulatory Visit (INDEPENDENT_AMBULATORY_CARE_PROVIDER_SITE_OTHER): Payer: Medicaid Other | Admitting: Internal Medicine

## 2021-06-14 ENCOUNTER — Other Ambulatory Visit: Payer: Self-pay

## 2021-06-14 ENCOUNTER — Encounter: Payer: Self-pay | Admitting: Internal Medicine

## 2021-06-14 ENCOUNTER — Ambulatory Visit (INDEPENDENT_AMBULATORY_CARE_PROVIDER_SITE_OTHER): Payer: Medicaid Other | Admitting: Internal Medicine

## 2021-06-14 VITALS — BP 136/80 | HR 64 | Temp 98.2°F | Resp 18 | Ht 70.0 in | Wt 240.0 lb

## 2021-06-14 DIAGNOSIS — E1165 Type 2 diabetes mellitus with hyperglycemia: Secondary | ICD-10-CM

## 2021-06-14 DIAGNOSIS — Z23 Encounter for immunization: Secondary | ICD-10-CM | POA: Diagnosis not present

## 2021-06-14 DIAGNOSIS — G8921 Chronic pain due to trauma: Secondary | ICD-10-CM

## 2021-06-14 DIAGNOSIS — I483 Typical atrial flutter: Secondary | ICD-10-CM | POA: Diagnosis not present

## 2021-06-14 DIAGNOSIS — M961 Postlaminectomy syndrome, not elsewhere classified: Secondary | ICD-10-CM

## 2021-06-14 DIAGNOSIS — E559 Vitamin D deficiency, unspecified: Secondary | ICD-10-CM

## 2021-06-14 DIAGNOSIS — I10 Essential (primary) hypertension: Secondary | ICD-10-CM | POA: Diagnosis not present

## 2021-06-14 DIAGNOSIS — E782 Mixed hyperlipidemia: Secondary | ICD-10-CM

## 2021-06-14 MED ORDER — LOSARTAN POTASSIUM 100 MG PO TABS: 100.0000 mg | ORAL_TABLET | Freq: Every day | ORAL | 1 refills | Status: DC

## 2021-06-14 MED ORDER — ACCU-CHEK GUIDE VI STRP: ORAL_STRIP | 11 refills | Status: DC

## 2021-06-14 MED ORDER — APIXABAN 5 MG PO TABS: 5.0000 mg | ORAL_TABLET | Freq: Two times a day (BID) | ORAL | 5 refills | Status: DC

## 2021-06-14 MED ORDER — AMLODIPINE BESYLATE 10 MG PO TABS: 10.0000 mg | ORAL_TABLET | Freq: Every day | ORAL | 0 refills | Status: DC

## 2021-06-14 MED ORDER — JANUMET XR 50-1000 MG PO TB24: 2.0000 | ORAL_TABLET | Freq: Every day | ORAL | 1 refills | Status: DC

## 2021-06-14 MED ORDER — "INSULIN SYRINGE-NEEDLE U-100 31G X 5/16"" 1 ML MISC": 11 refills | Status: DC

## 2021-06-14 MED ORDER — INSULIN DETEMIR 100 UNIT/ML ~~LOC~~ SOLN: 25.0000 [IU] | Freq: Every day | SUBCUTANEOUS | 1 refills | Status: DC

## 2021-06-14 NOTE — Assessment & Plan Note (Signed)
Check lipid profile Plan to add statin based on lipid profile.

## 2021-06-14 NOTE — Assessment & Plan Note (Signed)
During hospitalization in 2020 Was cardioverted On Eliquis Had bradycardia with Cardizem Followed by Cardiology 

## 2021-06-14 NOTE — Assessment & Plan Note (Signed)
Tree fell on left ankle in 2016, s/p surgical repair Has chronic right ankle pain, Tylenol PRN

## 2021-06-14 NOTE — Assessment & Plan Note (Signed)
Lab Results  Component Value Date   HGBA1C 7.6 (H) 02/28/2021   On Levemir 25 U qHS and Janumet BID Offered Levemir flexpen, but patient prefers to inject insulin with syringe Advised to follow diabetic diet On ACEi F/u CMP and lipid panel - plan to add statin Diabetic foot exam: Today Diabetic eye exam: Advised to follow up with Ophthalmology for diabetic eye exam

## 2021-06-14 NOTE — Assessment & Plan Note (Signed)
Last vitamin D Lab Results  Component Value Date   VD25OH 91 11/30/2020   Takes vitamin D 10,000 IU QD, advised to take 5000 IU twice in a week only

## 2021-06-14 NOTE — Assessment & Plan Note (Signed)
BP Readings from Last 1 Encounters:  06/14/21 136/80   Well-controlled with Losartan Counseled for compliance with the medications Advised DASH diet and moderate exercise/walking, at least 150 mins/week

## 2021-06-14 NOTE — Progress Notes (Signed)
New Patient Office Visit  Subjective:  Patient ID: Jeremy Burgess, male    DOB: [birth year]  Age: 55 y.o. MRN: [id number]  CC:  Chief Complaint  Patient presents with   New Patient (Initial Visit)    New patient was seeing gosrani needs refills on medications     HPI Jeremy Burgess is a 55 y.o. male with past medical history of HTN, atrial flutter, type II DM with neuropathy, chronic low back pain, chronic right ankle pain due to trauma and obesity who presents for establishing care.  HTN: BP is well-controlled. Takes medications regularly. Patient denies headache, dizziness, chest pain, dyspnea or palpitations.  Atrial flutter: Was noted during 2020 hospitalization, s/p cardioversion at that time.  He is on Eliquis, denies any signs of bleeding currently.  He follows up with Dr. Harl Bowie for it.  Type II DM: His HbA1c was 7.6 in 02/2021.  He takes Levemir 25 U qHS and Janumet twice daily.  He prefers to take insulin with syringe, and does not want flexpen for now.  He checks blood glucose once in a day, and it was 114 today.  He denies any polyuria or polydipsia or polyphagia.  He has history of trauma due to tree fall over his right ankle, s/p surgical repair.  He has chronic right ankle pain. He has numbness over his right great toe area since the injury. He also has had lumbar spine surgery and has chronic low back pain.  He has had 2 doses of COVID-vaccine.  He received flu vaccine in the office today.  Past Medical History:  Diagnosis Date   Accidentally struck by falling tree 02/08/2015   Acute blood loss anemia 02/09/2015   Acute respiratory failure ([place]) 02/09/2015   Acute respiratory failure ([place]) 02/09/2015   Ankle fracture, right 02/09/2015   Arthritis    Asthma    Atrial flutter ([place])    a. diagnosed in 08/2018 b. s/p DCCV in 09/2018 with return to NSR   Chronic pain due to trauma    Closed fracture of right ulna 02/08/2015   Closed right pilon fracture 02/23/2015    Diabetes mellitus    Diabetes mellitus ([place])    Femur fracture, right ([place]) 02/08/2015   Heart murmur    History of blood clots    DVT   Hypertension    Injury of hand, extensor tendon, right, sequela 09/01/2018   Pneumothorax    Postlaminectomy syndrome, lumbar region 11/13/2015   Rib fracture    Thrombocytopenia ([place]) 02/09/2015   Traumatic closed displaced fracture of multiple ribs of right side 02/08/2015   Traumatic hemopneumothorax 02/09/2015   Upper GI bleed 05/26/2016   Upper GI bleeding 05/27/2016   Trivial. EGD unremarkable.    Past Surgical History:  Procedure Laterality Date   BACK SURGERY     CARDIOVERSION N/A 10/06/2018   Procedure: CARDIOVERSION;  Surgeon: [name], MD;  Location: [facility];  Service: Cardiovascular;  Laterality: N/A;   CHOLECYSTECTOMY N/A 10/30/2016   Procedure: LAPAROSCOPIC CHOLECYSTECTOMY;  Surgeon: [name], MD;  Location: [facility];  Service: General;  Laterality: N/A;   COLONOSCOPY N/A 07/25/2016   Dr. [name]: 9 mm tubular adenoma. 5 year surveillance    ESOPHAGOGASTRODUODENOSCOPY  2009   EGD completed by Dr. [name] due to hematemesis. Findings of normal esophagus, tiny antral erosions, normal D1, D2   ESOPHAGOGASTRODUODENOSCOPY N/A 05/27/2016   Dr. [name]: normal    EXTERNAL FIXATION LEG Right 02/09/2015   Procedure: EXTERNAL FIXATION  LEG;  Surgeon: [name], MD;  Location: [place];  Service: Orthopedics;  Laterality: Right;   EXTERNAL FIXATION REMOVAL Right 02/14/2015   Procedure: REMOVAL EXTERNAL FIXATION LEG;  Surgeon: [name], MD;  Location: [place];  Service: Orthopedics;  Laterality: Right;   FEMUR IM NAIL Right 02/09/2015   Procedure: INTRAMEDULLARY (IM) RETROGRADE FEMORAL NAILING;  Surgeon: [name], MD;  Location: [place];  Service: Orthopedics;  Laterality: Right;   NOSE SURGERY     ORIF ANKLE FRACTURE Right 02/14/2015   Procedure: OPEN REDUCTION INTERNAL FIXATION (ORIF) PILON FRACTURE;  Surgeon: [name], MD;   Location: [place];  Service: Orthopedics;  Laterality: Right;   ORIF ULNAR FRACTURE Right 02/09/2015   Procedure: OPEN REDUCTION INTERNAL FIXATION (ORIF) ULNAR FRACTURE;  Surgeon: [name], MD;  Location: [place];  Service: Orthopedics;  Laterality: Right;   POLYPECTOMY  07/25/2016   Procedure: POLYPECTOMY;  Surgeon: [name], MD;  [facility];  Service: Endoscopy;;  colon    Family History  Problem Relation Age of Onset   Diabetes Mother    COPD Mother    CAD Father        CABG in his 66's   Diabetes Sister    Atrial fibrillation Brother    Colon cancer Neg Hx     Social History   Socioeconomic History   Marital status: Single    Spouse name: Not on file   Number of children: Not on file   Years of education: Not on file   Highest education level: Not on file  Occupational History   Not on file  Tobacco Use   Smoking status: Never   Smokeless tobacco: Never  Vaping Use   Vaping Use: Never used  Substance and Sexual Activity   Alcohol use: No    Comment: history of alcohol abuse but quit in 2008    Drug use: No   Sexual activity: Yes  Other Topics Concern   Not on file  Social History Narrative       Single.Lives alone.On disability secondary to tree fall accident 2016.   Social Determinants of Health   Financial Resource Strain: Medium Risk   Difficulty of Paying Living Expenses: Somewhat hard  Food Insecurity: Not on file  Transportation Needs: Not on file  Physical Activity: Not on file  Stress: Not on file  Social Connections: Not on file  Intimate Partner Violence: Not on file    ROS Review of Systems  Constitutional:  Negative for chills and fever.  HENT:  Negative for congestion and sore throat.   Eyes:  Negative for pain and discharge.  Respiratory:  Negative for cough and shortness of breath.   Cardiovascular:  Negative for chest pain and palpitations.  Gastrointestinal:  Negative for constipation, diarrhea, nausea and  vomiting.  Endocrine: Negative for polydipsia and polyuria.  Genitourinary:  Negative for dysuria and hematuria.  Musculoskeletal:  Positive for arthralgias and back pain. Negative for neck pain and neck stiffness.  Skin:  Negative for rash.  Neurological:  Positive for numbness. Negative for dizziness, weakness and headaches.  Psychiatric/Behavioral:  Negative for agitation and behavioral problems.    Objective:   Today's Vitals: BP 136/80 (BP Location: Left Arm, Cuff Size: Normal)   Pulse 64   Temp 98.2 F (36.8 C) (Oral)   Resp 18   Ht _0  (1.778 m)   Wt 240 lb (108.9 kg)   SpO2 96%  BMI 34.44 kg/m   Physical Exam Vitals reviewed.  Constitutional:      General: He is not in acute distress.    Appearance: He is not diaphoretic.  HENT:     Head: Normocephalic and atraumatic.     Nose: Nose normal.     Mouth/Throat:     Mouth: Mucous membranes are moist.  Eyes:     General: No scleral icterus.    Extraocular Movements: Extraocular movements intact.  Cardiovascular:     Rate and Rhythm: Normal rate and regular rhythm.     Pulses: Normal pulses.     Heart sounds: Normal heart sounds. No murmur heard. Pulmonary:     Breath sounds: Normal breath sounds. No wheezing or rales.  Musculoskeletal:     Cervical back: Neck supple. No tenderness.     Right lower leg: No edema.     Left lower leg: No edema.  Skin:    General: Skin is warm.     Findings: No rash.  Neurological:     General: No focal deficit present.     Mental Status: He is alert and oriented to person, place, and time.     Sensory: Sensory deficit present.     Motor: No weakness.  Psychiatric:        Mood and Affect: Mood normal.        Behavior: Behavior normal.    Assessment & Plan:   Problem List Items Addressed This Visit       Cardiovascular and Mediastinum   Essential hypertension - Primary    BP Readings from Last 1 Encounters:  06/14/21 136/80  Well-controlled with Losartan Counseled  for compliance with the medications Advised DASH diet and moderate exercise/walking, at least 150 mins/week       Relevant Medications   amLODipine (NORVASC) 10 MG tablet   apixaban (ELIQUIS) 5 MG TABS tablet   losartan (COZAAR) 100 MG tablet   Other Relevant Orders   CBC with Differential/Platelet   Typical atrial flutter ([place])    During hospitalization in 2020 Was cardioverted On Eliquis Had bradycardia with Cardizem Followed by Cardiology      Relevant Medications   amLODipine (NORVASC) 10 MG tablet   apixaban (ELIQUIS) 5 MG TABS tablet   losartan (COZAAR) 100 MG tablet     Endocrine   Uncontrolled type 2 diabetes mellitus with hyperglycemia (HCC)    Lab Results  Component Value Date   HGBA1C 7.6 (H) 02/28/2021  On Levemir 25 U qHS and Janumet BID Offered Levemir flexpen, but patient prefers to inject insulin with syringe Advised to follow diabetic diet On ACEi F/u CMP and lipid panel - plan to add statin Diabetic foot exam: Today Diabetic eye exam: Advised to follow up with Ophthalmology for diabetic eye exam       Relevant Medications   insulin detemir (LEVEMIR) 100 UNIT/ML injection   Insulin Syringe-Needle U-100 (SURE COMFORT INSULIN SYRINGE) 31G X 5/16" 1 ML MISC   JANUMET XR 50-1000 MG TB24   losartan (COZAAR) 100 MG tablet   glucose blood (ACCU-CHEK GUIDE) test strip   Other Relevant Orders   Hemoglobin A1c   CMP14+EGFR   CBC with Differential/Platelet     Other   Postlaminectomy syndrome, lumbar region    Chronic low back pain Tylenol PRN Avoid NSAIDs      Chronic pain due to trauma    Tree fell on left ankle in 2016, s/p surgical repair Has chronic right ankle pain, Tylenol PRN  Vitamin D deficiency    Last vitamin D Lab Results  Component Value Date   VD25OH 41 11/30/2020  Takes vitamin D 10,000 IU QD, advised to take 5000 IU twice in a week only      Mixed hyperlipidemia    Check lipid profile Plan to add statin based on lipid  profile.      Relevant Medications   amLODipine (NORVASC) 10 MG tablet   apixaban (ELIQUIS) 5 MG TABS tablet   losartan (COZAAR) 100 MG tablet   Other Relevant Orders   Lipid Profile    Outpatient Encounter Medications as of 06/14/2021  Medication Sig   Cholecalciferol (VITAMIN D-3) 125 MCG (5000 UT) TABS Take 3 tablets by mouth daily.   [DISCONTINUED] amLODipine (NORVASC) 10 MG tablet Take 1 tablet (10 mg total) by mouth daily.   [DISCONTINUED] apixaban (ELIQUIS) 5 MG TABS tablet Take 1 tablet (5 mg total) by mouth 2 (two) times daily.   [DISCONTINUED] glucose blood (ACCU-CHEK GUIDE) test strip USE TO TEST TWICE DAILY.   [DISCONTINUED] insulin detemir (LEVEMIR) 100 UNIT/ML injection Inject 0.25 mLs (25 Units total) into the skin daily.   [DISCONTINUED] Insulin Syringe-Needle U-100 (SURE COMFORT INSULIN SYRINGE) 31G X 5/16" 1 ML MISC USE AS DIRECTED.   [DISCONTINUED] JANUMET XR 50-1000 MG TB24 Take 2 tablets by mouth daily.   [DISCONTINUED] losartan (COZAAR) 100 MG tablet Take 1 tablet (100 mg total) by mouth daily.   amLODipine (NORVASC) 10 MG tablet Take 1 tablet (10 mg total) by mouth daily.   apixaban (ELIQUIS) 5 MG TABS tablet Take 1 tablet (5 mg total) by mouth 2 (two) times daily.   glucose blood (ACCU-CHEK GUIDE) test strip USE TO TEST TWICE DAILY.   insulin detemir (LEVEMIR) 100 UNIT/ML injection Inject 0.25 mLs (25 Units total) into the skin daily.   Insulin Syringe-Needle U-100 (SURE COMFORT INSULIN SYRINGE) 31G X 5/16" 1 ML MISC USE AS DIRECTED.   JANUMET XR 50-1000 MG TB24 Take 2 tablets by mouth daily.   losartan (COZAAR) 100 MG tablet Take 1 tablet (100 mg total) by mouth daily.   No facility-administered encounter medications on file as of 06/14/2021.    Follow-up: Return in about 4 months (around 10/12/2021) for HTN and DM.   Lindell Spar, MD

## 2021-06-14 NOTE — Assessment & Plan Note (Signed)
Chronic low back pain Tylenol PRN Avoid NSAIDs

## 2021-06-14 NOTE — Patient Instructions (Signed)
Please continue to take medications as prescribed. ? ?Please continue to follow low carb diet and perform moderate exercise/walking at least 150 mins/week. ?

## 2021-06-15 ENCOUNTER — Telehealth: Payer: Self-pay | Admitting: Internal Medicine

## 2021-06-15 LAB — CMP14+EGFR
ALT: 18 IU/L (ref 0–44)
AST: 19 IU/L (ref 0–40)
Albumin/Globulin Ratio: 1.7 (ref 1.2–2.2)
Albumin: 4.6 g/dL (ref 3.8–4.9)
Alkaline Phosphatase: 65 IU/L (ref 44–121)
BUN/Creatinine Ratio: 11 (ref 9–20)
BUN: 13 mg/dL (ref 6–24)
Bilirubin Total: 0.4 mg/dL (ref 0.0–1.2)
CO2: 23 mmol/L (ref 20–29)
Calcium: 9.6 mg/dL (ref 8.7–10.2)
Chloride: 106 mmol/L (ref 96–106)
Creatinine, Ser: 1.17 mg/dL (ref 0.76–1.27)
Globulin, Total: 2.7 g/dL (ref 1.5–4.5)
Glucose: 120 mg/dL — ABNORMAL HIGH (ref 70–99)
Potassium: 4.6 mmol/L (ref 3.5–5.2)
Sodium: 143 mmol/L (ref 134–144)
Total Protein: 7.3 g/dL (ref 6.0–8.5)
eGFR: 73 mL/min/{1.73_m2} (ref >59)

## 2021-06-15 LAB — HEMOGLOBIN A1C
Est. average glucose Bld gHb Est-mCnc: 163 mg/dL
Hgb A1c MFr Bld: 7.3 % — ABNORMAL HIGH (ref 4.8–5.6)

## 2021-06-15 LAB — LIPID PANEL
Chol/HDL Ratio: 3.3 ratio (ref 0.0–5.0)
Cholesterol, Total: 134 mg/dL (ref 100–199)
HDL: 41 mg/dL (ref >39)
LDL Chol Calc (NIH): 77 mg/dL (ref 0–99)
Triglycerides: 83 mg/dL (ref 0–149)
VLDL Cholesterol Cal: 16 mg/dL (ref 5–40)

## 2021-06-15 LAB — CBC WITH DIFFERENTIAL/PLATELET
Basophils Absolute: 0.1 10*3/uL (ref 0.0–0.2)
Basos: 1 %
EOS (ABSOLUTE): 0.2 10*3/uL (ref 0.0–0.4)
Eos: 2 %
Hematocrit: 40.9 % (ref 37.5–51.0)
Hemoglobin: 13.3 g/dL (ref 13.0–17.7)
Immature Grans (Abs): 0 10*3/uL (ref 0.0–0.1)
Immature Granulocytes: 0 %
Lymphocytes Absolute: 2.1 10*3/uL (ref 0.7–3.1)
Lymphs: 24 %
MCH: 28.2 pg (ref 26.6–33.0)
MCHC: 32.5 g/dL (ref 31.5–35.7)
MCV: 87 fL (ref 79–97)
Monocytes Absolute: 0.7 10*3/uL (ref 0.1–0.9)
Monocytes: 8 %
Neutrophils Absolute: 5.6 10*3/uL (ref 1.4–7.0)
Neutrophils: 65 %
Platelets: 232 10*3/uL (ref 150–450)
RBC: 4.71 x10E6/uL (ref 4.14–5.80)
RDW: 12.9 % (ref 11.6–15.4)
WBC: 8.6 10*3/uL (ref 3.4–10.8)

## 2021-06-15 NOTE — Telephone Encounter (Signed)
Pt advised of lab results with verbal understanding  

## 2021-06-15 NOTE — Telephone Encounter (Signed)
Pt was returning a phone call

## 2021-07-11 ENCOUNTER — Encounter: Payer: Self-pay | Admitting: *Deleted

## 2021-08-30 ENCOUNTER — Other Ambulatory Visit: Payer: Self-pay | Admitting: *Deleted

## 2021-08-30 ENCOUNTER — Telehealth: Payer: Self-pay

## 2021-08-30 DIAGNOSIS — E1165 Type 2 diabetes mellitus with hyperglycemia: Secondary | ICD-10-CM

## 2021-08-30 MED ORDER — "INSULIN SYRINGE-NEEDLE U-100 31G X 5/16"" 1 ML MISC": 11 refills | Status: DC

## 2021-08-30 NOTE — Telephone Encounter (Signed)
Patient came by office need med refill  insulin detemir (LEVEMIR) 100 UNIT/ML injection  [facility] [place]

## 2021-08-30 NOTE — Telephone Encounter (Signed)
Medication sent to pharmacy  

## 2021-09-07 ENCOUNTER — Other Ambulatory Visit: Payer: Self-pay | Admitting: Pharmacist

## 2021-09-07 ENCOUNTER — Encounter: Payer: Self-pay | Admitting: Cardiology

## 2021-09-07 ENCOUNTER — Ambulatory Visit (INDEPENDENT_AMBULATORY_CARE_PROVIDER_SITE_OTHER): Payer: Medicaid Other | Admitting: Cardiology

## 2021-09-07 VITALS — BP 128/60 | HR 58 | Ht 71.0 in | Wt 241.8 lb

## 2021-09-07 DIAGNOSIS — I483 Typical atrial flutter: Secondary | ICD-10-CM | POA: Diagnosis not present

## 2021-09-07 DIAGNOSIS — I480 Paroxysmal atrial fibrillation: Secondary | ICD-10-CM

## 2021-09-07 DIAGNOSIS — D6869 Other thrombophilia: Secondary | ICD-10-CM

## 2021-09-07 DIAGNOSIS — I10 Essential (primary) hypertension: Secondary | ICD-10-CM | POA: Diagnosis not present

## 2021-09-07 MED ORDER — APIXABAN 5 MG PO TABS: 5.0000 mg | ORAL_TABLET | Freq: Two times a day (BID) | ORAL | 5 refills | Status: DC

## 2021-09-07 NOTE — Progress Notes (Signed)
Clinical Summary Jeremy Burgess is a 55 y.o.male seen today for follow up of the following medical problems.   1. Atrial flutter - new diagnosis during Jan 2020 admission - at that time started on anticoag, later cardioverted 10/06/2018 - during 10/23/2018 visit was still in [place] - currently on eliquis. Cardizem 120 stopped after cardioversion due to some low heart rates.     - no recent palpitatoins - compliant with meds - no bleeding on eliquis.      2. HTN - compliant with meds   3. Chest pain - 05/2020 visit with PA Leonides Sake reported left sided chest pain - 05/2020 nuclear stress: no ischemia.    -no recent pain     4. DM2 - 2020 LDL was 73, has not been on therapy.    Past Medical History:  Diagnosis Date   Accidentally struck by falling tree 02/08/2015   Acute blood loss anemia 02/09/2015   Acute respiratory failure ([place]) 02/09/2015   Acute respiratory failure ([place]) 02/09/2015   Ankle fracture, right 02/09/2015   Arthritis    Asthma    Atrial flutter ([place])    a. diagnosed in 08/2018 b. s/p DCCV in 09/2018 with return to NSR   Chronic pain due to trauma    Closed fracture of right ulna 02/08/2015   Closed right pilon fracture 02/23/2015   Diabetes mellitus    Diabetes mellitus ([place])    Femur fracture, right ([place]) 02/08/2015   Heart murmur    History of blood clots    DVT   Hypertension    Injury of hand, extensor tendon, right, sequela 09/01/2018   Pneumothorax    Postlaminectomy syndrome, lumbar region 11/13/2015   Rib fracture    Thrombocytopenia ([place]) 02/09/2015   Traumatic closed displaced fracture of multiple ribs of right side 02/08/2015   Traumatic hemopneumothorax 02/09/2015   Upper GI bleed 05/26/2016   Upper GI bleeding 05/27/2016   Trivial. EGD unremarkable.     Allergies  Allergen Reactions   Bee Venom Anaphylaxis    HONEY BEES   Lisinopril Cough     Current Outpatient Medications  Medication Sig Dispense Refill   amLODipine (NORVASC) 10 MG  tablet Take 1 tablet (10 mg total) by mouth daily. 90 tablet 0   apixaban (ELIQUIS) 5 MG TABS tablet Take 1 tablet (5 mg total) by mouth 2 (two) times daily. 60 tablet 5   Cholecalciferol (VITAMIN D-3) 125 MCG (5000 UT) TABS Take 3 tablets by mouth daily.     glucose blood (ACCU-CHEK GUIDE) test strip USE TO TEST TWICE DAILY. 200 strip 11   insulin detemir (LEVEMIR) 100 UNIT/ML injection Inject 0.25 mLs (25 Units total) into the skin daily. 10 mL 1   Insulin Syringe-Needle U-100 (SURE COMFORT INSULIN SYRINGE) 31G X 5/16" 1 ML MISC USE AS DIRECTED. 100 each 11   JANUMET XR 50-1000 MG TB24 Take 2 tablets by mouth daily. 180 tablet 1   losartan (COZAAR) 100 MG tablet Take 1 tablet (100 mg total) by mouth daily. 90 tablet 1   No current facility-administered medications for this visit.     Past Surgical History:  Procedure Laterality Date   BACK SURGERY     CARDIOVERSION N/A 10/06/2018   Procedure: CARDIOVERSION;  Surgeon: [name], MD;  Location: [facility];  Service: Cardiovascular;  Laterality: N/A;   CHOLECYSTECTOMY N/A 10/30/2016   Procedure: LAPAROSCOPIC CHOLECYSTECTOMY;  Surgeon: [name], MD;  Location: [facility];  Service: General;  Laterality:  N/A;   COLONOSCOPY N/A 07/25/2016   Dr. [name]: 9 mm tubular adenoma. 5 year surveillance    ESOPHAGOGASTRODUODENOSCOPY  2009   EGD completed by Dr. [name] due to hematemesis. Findings of normal esophagus, tiny antral erosions, normal D1, D2   ESOPHAGOGASTRODUODENOSCOPY N/A 05/27/2016   Dr. [name]: normal    EXTERNAL FIXATION LEG Right 02/09/2015   Procedure: EXTERNAL FIXATION LEG;  Surgeon: [name], MD;  Location: [place];  Service: Orthopedics;  Laterality: Right;   EXTERNAL FIXATION REMOVAL Right 02/14/2015   Procedure: REMOVAL EXTERNAL FIXATION LEG;  Surgeon: [name], MD;  Location: [place];  Service: Orthopedics;  Laterality: Right;   FEMUR IM NAIL Right 02/09/2015   Procedure: INTRAMEDULLARY (IM) RETROGRADE FEMORAL  NAILING;  Surgeon: [name], MD;  Location: [place];  Service: Orthopedics;  Laterality: Right;   NOSE SURGERY     ORIF ANKLE FRACTURE Right 02/14/2015   Procedure: OPEN REDUCTION INTERNAL FIXATION (ORIF) PILON FRACTURE;  Surgeon: [name], MD;  Location: [facility] [place];  Service: Orthopedics;  Laterality: Right;   ORIF ULNAR FRACTURE Right 02/09/2015   Procedure: OPEN REDUCTION INTERNAL FIXATION (ORIF) ULNAR FRACTURE;  Surgeon: [name], MD;  Location: [place];  Service: Orthopedics;  Laterality: Right;   POLYPECTOMY  07/25/2016   Procedure: POLYPECTOMY;  Surgeon: [name], MD;  [facility];  Service: Endoscopy;;  colon     Allergies  Allergen Reactions   Bee Venom Anaphylaxis    HONEY BEES   Lisinopril Cough      Family History  Problem Relation Age of Onset   Diabetes Mother    COPD Mother    CAD Father        CABG in his 82's   Diabetes Sister    Atrial fibrillation Brother    Colon cancer Neg Hx      Social History Jeremy Burgess reports that he has never smoked. He has never used smokeless tobacco. Jeremy Burgess reports no history of alcohol use.   Review of Systems CONSTITUTIONAL: No weight loss, fever, chills, weakness or fatigue.  HEENT: Eyes: No visual loss, blurred vision, double vision or yellow sclerae.No hearing loss, sneezing, congestion, runny nose or sore throat.  SKIN: No rash or itching.  CARDIOVASCULAR: per hpi RESPIRATORY: No shortness of breath, cough or sputum.  GASTROINTESTINAL: No anorexia, nausea, vomiting or diarrhea. No abdominal pain or blood.  GENITOURINARY: No burning on urination, no polyuria NEUROLOGICAL: No headache, dizziness, syncope, paralysis, ataxia, numbness or tingling in the extremities. No change in bowel or bladder control.  MUSCULOSKELETAL: No muscle, back pain, joint pain or stiffness.  LYMPHATICS: No enlarged nodes. No history of splenectomy.  PSYCHIATRIC: No history of depression or anxiety.   ENDOCRINOLOGIC: No reports of sweating, cold or heat intolerance. No polyuria or polydipsia.  [name]   Physical Examination Today's Vitals   09/07/21 1334  BP: 138/70  Pulse: (!) 58  SpO2: 98%  Weight: 241 lb 12.8 oz (109.7 kg)  Height: 5\' 11"  (1.803 m)   Body mass index is 33.72 kg/m.  Gen: resting comfortably, no acute distress HEENT: no scleral icterus, pupils equal round and reactive, no palptable cervical adenopathy,  CV: RRR, no m/r/g no jvd Resp: Clear to auscultation bilaterally GI: abdomen is soft, non-tender, non-distended, normal bowel sounds, no hepatosplenomegaly MSK: extremities are warm, no edema.  Skin: warm, no rash Neuro:  no focal deficits Psych: appropriate affect   Diagnostic Studies Echocardiogram: 08/2018   Study  Conclusions - Left ventricle: The cavity size was normal. Wall thickness was   normal. Systolic function was normal. The estimated ejection   fraction was in the range of 60% to 65%. Wall motion was normal;   there were no regional wall motion abnormalities. The study is   not technically sufficient to allow evaluation of LV diastolic   function. - Aortic valve: Valve area (VTI): 2.83 cm^2. Valve area (Vmax):   2.61 cm^2. - Left atrium: The atrium was moderately to severely dilated. - Right atrium: The atrium was mildly dilated. - Atrial septum: No defect or patent foramen ovale was identified.     05/2020 nuclear stress There was no ST segment deviation noted during stress. The study is normal. There are no perfusion defects This is a low risk study. The left ventricular ejection fraction is normal (55-65%).    Assessment and Plan  Aflutter/acquired thrombophilia - s/p DCCV 09/2018, we actually have not seen recurrent aflutter since that time - no recent symptoms - continue current meds including anticoagulation.  - EKG today shows NSR   2. HTN - he is at goal, continue current meds   3. DM2 - could consider low to moderate  dose statin in setting of DM2 based on lipid guidelines even though his LDL has not been significantlty elevated.       Arnoldo Lenis, M.D., F.A.C.C.

## 2021-09-07 NOTE — Patient Instructions (Signed)
Follow-Up: °Follow up with Dr. Branch in 6 months. ° °If you need a refill on your cardiac medications before your next appointment, please call your pharmacy. ° °

## 2021-09-21 ENCOUNTER — Other Ambulatory Visit: Payer: Self-pay | Admitting: Internal Medicine

## 2021-09-21 DIAGNOSIS — I10 Essential (primary) hypertension: Secondary | ICD-10-CM

## 2021-10-12 ENCOUNTER — Ambulatory Visit (INDEPENDENT_AMBULATORY_CARE_PROVIDER_SITE_OTHER): Payer: Medicaid Other | Admitting: Internal Medicine

## 2021-10-12 ENCOUNTER — Encounter: Payer: Self-pay | Admitting: Internal Medicine

## 2021-10-12 ENCOUNTER — Other Ambulatory Visit: Payer: Self-pay

## 2021-10-12 VITALS — BP 150/74 | HR 67 | Resp 18 | Ht 70.0 in | Wt 244.4 lb

## 2021-10-12 DIAGNOSIS — I483 Typical atrial flutter: Secondary | ICD-10-CM | POA: Diagnosis not present

## 2021-10-12 DIAGNOSIS — Z7901 Long term (current) use of anticoagulants: Secondary | ICD-10-CM

## 2021-10-12 DIAGNOSIS — W57XXXA Bitten or stung by nonvenomous insect and other nonvenomous arthropods, initial encounter: Secondary | ICD-10-CM

## 2021-10-12 DIAGNOSIS — E1165 Type 2 diabetes mellitus with hyperglycemia: Secondary | ICD-10-CM

## 2021-10-12 DIAGNOSIS — E782 Mixed hyperlipidemia: Secondary | ICD-10-CM

## 2021-10-12 DIAGNOSIS — M19079 Primary osteoarthritis, unspecified ankle and foot: Secondary | ICD-10-CM | POA: Diagnosis not present

## 2021-10-12 DIAGNOSIS — Z125 Encounter for screening for malignant neoplasm of prostate: Secondary | ICD-10-CM

## 2021-10-12 DIAGNOSIS — I10 Essential (primary) hypertension: Secondary | ICD-10-CM | POA: Diagnosis not present

## 2021-10-12 DIAGNOSIS — E559 Vitamin D deficiency, unspecified: Secondary | ICD-10-CM

## 2021-10-12 LAB — POCT GLYCOSYLATED HEMOGLOBIN (HGB A1C): HbA1c, POC (controlled diabetic range): 7.7 % — AB (ref 0.0–7.0)

## 2021-10-12 MED ORDER — INSULIN DETEMIR 100 UNIT/ML ~~LOC~~ SOLN: 25.0000 [IU] | Freq: Every day | SUBCUTANEOUS | 3 refills | Status: DC

## 2021-10-12 MED ORDER — DOXYCYCLINE HYCLATE 100 MG PO TABS: 100.0000 mg | ORAL_TABLET | Freq: Two times a day (BID) | ORAL | 0 refills | Status: DC

## 2021-10-12 MED ORDER — "INSULIN SYRINGE-NEEDLE U-100 31G X 5/16"" 1 ML MISC": 11 refills | Status: DC

## 2021-10-12 MED ORDER — CLOBETASOL PROPIONATE 0.05 % EX CREA: 1.0000 "application " | TOPICAL_CREAM | Freq: Two times a day (BID) | CUTANEOUS | 0 refills | Status: DC

## 2021-10-12 NOTE — Assessment & Plan Note (Signed)
During hospitalization in 2020 Was cardioverted On Eliquis Had bradycardia with Cardizem Followed by Cardiology 

## 2021-10-12 NOTE — Progress Notes (Signed)
Established Patient Office Visit  Subjective:  Patient ID: Jeremy Burgess, male    DOB: May 01, 1966  Age: 56 y.o. MRN: [id number]  CC:  Chief Complaint  Patient presents with   Follow-up    4 month follow up foot and back hurt when the weather changed pt has tick bite on left shoulder that has been itching since Monday     HPI [name] is a 56 y.o. male with past medical history of HTN, atrial flutter, type II DM with neuropathy, chronic low back pain, chronic right ankle pain due to trauma and obesity who presents for f/u of his chronic medical conditions.  He had a tick bite 4 days ago on left scapular area.  He got it removed with tweezers.  He has been itching in that area.  He also has redness locally.  Denies any fever, chills or recent worsening of fatigue.  HTN: His BP was elevated today.  He attributes it to dental pain and chronic right ankle pain.  His BP usually remains well controlled with amlodipine and losartan.  He denies any headache, dizziness, chest pain, dyspnea or palpitations currently.  Type II DM: His HbA1c was 7.6 in 02/2021.  He takes Levemir 25 U qHS and Janumet twice daily.  He prefers to take insulin with syringe, and does not want flexpen for now. He denies any polyuria or polydipsia or polyphagia. Blood glucose averages are: 7 days: 111 14 days: 120  Disability paperwork signed today.  He prefers to wait for Shingrix vaccine for now.    Past Medical History:  Diagnosis Date   Accidentally struck by falling tree 02/08/2015   Acute blood loss anemia 02/09/2015   Acute respiratory failure ([place]) 02/09/2015   Acute respiratory failure ([place]) 02/09/2015   Ankle fracture, right 02/09/2015   Arthritis    Asthma    Atrial flutter ([place])    a. diagnosed in 08/2018 b. s/p DCCV in 09/2018 with return to NSR   Chronic pain due to trauma    Closed fracture of right ulna 02/08/2015   Closed right pilon fracture 02/23/2015   Diabetes mellitus    Diabetes  mellitus ([place])    Femur fracture, right ([place]) 02/08/2015   Heart murmur    History of blood clots    DVT   Hypertension    Injury of hand, extensor tendon, right, sequela 09/01/2018   Pneumothorax    Postlaminectomy syndrome, lumbar region 11/13/2015   Rib fracture    Thrombocytopenia ([place]) 02/09/2015   Traumatic closed displaced fracture of multiple ribs of right side 02/08/2015   Traumatic hemopneumothorax 02/09/2015   Upper GI bleed 05/26/2016   Upper GI bleeding 05/27/2016   Trivial. EGD unremarkable.    Past Surgical History:  Procedure Laterality Date   BACK SURGERY     CARDIOVERSION N/A 10/06/2018   Procedure: CARDIOVERSION;  Surgeon: [name], MD;  Location: [facility];  Service: Cardiovascular;  Laterality: N/A;   CHOLECYSTECTOMY N/A 10/30/2016   Procedure: LAPAROSCOPIC CHOLECYSTECTOMY;  Surgeon: [name], MD;  Location: [facility];  Service: General;  Laterality: N/A;   COLONOSCOPY N/A 07/25/2016   Dr. [name]: 9 mm tubular adenoma. 5 year surveillance    ESOPHAGOGASTRODUODENOSCOPY  2009   EGD completed by Dr. [name] due to hematemesis. Findings of normal esophagus, tiny antral erosions, normal D1, D2   ESOPHAGOGASTRODUODENOSCOPY N/A 05/27/2016   Dr. [name]: normal    EXTERNAL FIXATION LEG Right 02/09/2015   Procedure: EXTERNAL FIXATION LEG;  Surgeon: [name], MD;  Location: [place];  Service: Orthopedics;  Laterality: Right;   EXTERNAL FIXATION REMOVAL Right 02/14/2015   Procedure: REMOVAL EXTERNAL FIXATION LEG;  Surgeon: [name], MD;  Location: [place];  Service: Orthopedics;  Laterality: Right;   FEMUR IM NAIL Right 02/09/2015   Procedure: INTRAMEDULLARY (IM) RETROGRADE FEMORAL NAILING;  Surgeon: [name], MD;  Location: [place];  Service: Orthopedics;  Laterality: Right;   NOSE SURGERY     ORIF ANKLE FRACTURE Right 02/14/2015   Procedure: OPEN REDUCTION INTERNAL FIXATION (ORIF) PILON FRACTURE;  Surgeon: [name], MD;  Location: [place];  Service:  Orthopedics;  Laterality: Right;   ORIF ULNAR FRACTURE Right 02/09/2015   Procedure: OPEN REDUCTION INTERNAL FIXATION (ORIF) ULNAR FRACTURE;  Surgeon: [name], MD;  Location: [place];  Service: Orthopedics;  Laterality: Right;   POLYPECTOMY  07/25/2016   Procedure: POLYPECTOMY;  Surgeon: [name], MD;  [facility];  Service: Endoscopy;;  colon    Family History  Problem Relation Age of Onset   Diabetes Mother    COPD Mother    CAD Father        CABG in his 13's   Diabetes Sister    Atrial fibrillation Brother    Colon cancer Neg Hx     Social History   Socioeconomic History   Marital status: Single    Spouse name: Not on file   Number of children: Not on file   Years of education: Not on file   Highest education level: Not on file  Occupational History   Not on file  Tobacco Use   Smoking status: Never   Smokeless tobacco: Never  Vaping Use   Vaping Use: Never used  Substance and Sexual Activity   Alcohol use: No    Comment: history of alcohol abuse but quit in 2008    Drug use: No   Sexual activity: Yes  Other Topics Concern   Not on file  Social History Narrative       Single.Lives alone.On disability secondary to tree fall accident 2016.   Social Determinants of Health   Financial Resource Strain: Not on file  Food Insecurity: Not on file  Transportation Needs: Not on file  Physical Activity: Not on file  Stress: Not on file  Social Connections: Not on file  Intimate Partner Violence: Not on file    Outpatient Medications Prior to Visit  Medication Sig Dispense Refill   amLODipine (NORVASC) 10 MG tablet Take 1 tablet by mouth once daily 90 tablet 0   apixaban (ELIQUIS) 5 MG TABS tablet Take 1 tablet (5 mg total) by mouth 2 (two) times daily. 60 tablet 5   Cholecalciferol (VITAMIN D-3) 125 MCG (5000 UT) TABS Take 3 tablets by mouth daily.     glucose blood (ACCU-CHEK GUIDE) test strip USE TO TEST TWICE DAILY. 200 strip 11    JANUMET XR 50-1000 MG TB24 Take 2 tablets by mouth daily. 180 tablet 1   losartan (COZAAR) 100 MG tablet Take 1 tablet (100 mg total) by mouth daily. 90 tablet 1   insulin detemir (LEVEMIR) 100 UNIT/ML injection Inject 0.25 mLs (25 Units total) into the skin daily. 10 mL 1   Insulin Syringe-Needle U-100 (SURE COMFORT INSULIN SYRINGE) 31G X 5/16" 1 ML MISC USE AS DIRECTED. 100 each 11   No facility-administered medications prior to visit.    Allergies  Allergen Reactions   Bee Venom Anaphylaxis  HONEY BEES   Lisinopril Cough    ROS Review of Systems  Constitutional:  Negative for chills and fever.  HENT:  Negative for congestion and sore throat.   Eyes:  Negative for pain and discharge.  Respiratory:  Negative for cough and shortness of breath.   Cardiovascular:  Negative for chest pain and palpitations.  Gastrointestinal:  Negative for constipation, diarrhea, nausea and vomiting.  Endocrine: Negative for polydipsia and polyuria.  Genitourinary:  Negative for dysuria and hematuria.  Musculoskeletal:  Positive for arthralgias and back pain. Negative for neck pain and neck stiffness.  Skin:  Positive for rash.  Neurological:  Positive for numbness. Negative for dizziness, weakness and headaches.  Psychiatric/Behavioral:  Negative for agitation and behavioral problems.      Objective:    Physical Exam Vitals reviewed.  Constitutional:      General: He is not in acute distress.    Appearance: He is not diaphoretic.  HENT:     Head: Normocephalic and atraumatic.     Nose: Nose normal.     Mouth/Throat:     Mouth: Mucous membranes are moist.  Eyes:     General: No scleral icterus.    Extraocular Movements: Extraocular movements intact.  Cardiovascular:     Rate and Rhythm: Normal rate and regular rhythm.     Pulses: Normal pulses.     Heart sounds: Normal heart sounds. No murmur heard. Pulmonary:     Breath sounds: Normal breath sounds. No wheezing or rales.   Musculoskeletal:     Cervical back: Neck supple. No tenderness.     Right lower leg: No edema.     Left lower leg: No edema.  Skin:    General: Skin is warm.     Findings: Rash (Erythema over left scapular area) present.  Neurological:     General: No focal deficit present.     Mental Status: He is alert and oriented to person, place, and time.     Sensory: Sensory deficit present.     Motor: No weakness.  Psychiatric:        Mood and Affect: Mood normal.        Behavior: Behavior normal.    BP (!) 150/74 (BP Location: Left Arm, Cuff Size: Normal)    Pulse 67    Resp 18    Ht '5\' 10"'  (1.778 m)    Wt 244 lb 6.4 oz (110.9 kg)    SpO2 96%    BMI 35.07 kg/m  Wt Readings from Last 3 Encounters:  10/12/21 244 lb 6.4 oz (110.9 kg)  09/07/21 241 lb 12.8 oz (109.7 kg)  06/14/21 240 lb (108.9 kg)    Lab Results  Component Value Date   TSH 4.939 (H) 08/31/2018   Lab Results  Component Value Date   WBC 8.6 06/14/2021   HGB 13.3 06/14/2021   HCT 40.9 06/14/2021   MCV 87 06/14/2021   PLT 232 06/14/2021   Lab Results  Component Value Date   NA 143 06/14/2021   K 4.6 06/14/2021   CO2 23 06/14/2021   GLUCOSE 120 (H) 06/14/2021   BUN 13 06/14/2021   CREATININE 1.17 06/14/2021   BILITOT 0.4 06/14/2021   ALKPHOS 65 06/14/2021   AST 19 06/14/2021   ALT 18 06/14/2021   PROT 7.3 06/14/2021   ALBUMIN 4.6 06/14/2021   CALCIUM 9.6 06/14/2021   ANIONGAP 7 10/05/2018   EGFR 73 06/14/2021   Lab Results  Component Value Date   CHOL  134 06/14/2021   Lab Results  Component Value Date   HDL 41 06/14/2021   Lab Results  Component Value Date   LDLCALC 77 06/14/2021   Lab Results  Component Value Date   TRIG 83 06/14/2021   Lab Results  Component Value Date   CHOLHDL 3.3 06/14/2021   Lab Results  Component Value Date   HGBA1C 7.3 (H) 06/14/2021      Assessment & Plan:   Problem List Items Addressed This Visit       Cardiovascular and Mediastinum   Essential  hypertension - Primary    BP Readings from Last 1 Encounters:  10/12/21 (!) 150/74  Elevated likely due to dental pain and arthritic pain Usually well-controlled with Losartan Counseled for compliance with the medications Advised DASH diet and moderate exercise/walking, at least 150 mins/week       Relevant Orders   TSH   CBC with Differential/Platelet   Typical atrial flutter ([place])    During hospitalization in 2020 Was cardioverted On Eliquis Had bradycardia with Cardizem Followed by Cardiology      Relevant Orders   TSH   CBC with Differential/Platelet     Endocrine   Uncontrolled type 2 diabetes mellitus with hyperglycemia ([place])    Lab Results  Component Value Date   HGBA1C 7.3 (H) 06/14/2021  On Levemir 25 U qHS and Janumet BID Offered Levemir flexpen, but patient prefers to inject insulin with syringe Advised to follow diabetic diet On ACEi F/u CMP and lipid panel - plan to add statin Diabetic eye exam: Advised to follow up with Ophthalmology for diabetic eye exam      Relevant Medications   insulin detemir (LEVEMIR) 100 UNIT/ML injection   Insulin Syringe-Needle U-100 (SURE COMFORT INSULIN SYRINGE) 31G X 5/16" 1 ML MISC   Other Relevant Orders   Hemoglobin A1c   CMP14+EGFR     Musculoskeletal and Integument   Ankle arthritis    Chronic right ankle pain, has had traumatic injury and has had surgical exploration of the right ankle in the past Advised to use Tylenol and Voltaren gel as needed        Other   Vitamin D deficiency   Relevant Orders   VITAMIN D 25 Hydroxy (Vit-D Deficiency, Fractures)   Mixed hyperlipidemia   Relevant Orders   Lipid Profile   Prostate cancer screening    Ordered PSA after discussing its limitations for prostate cancer screening, including false positive results leading additional investigations.      Relevant Orders   PSA   Other Visit Diagnoses     Tick bite, unspecified site, initial encounter       Relevant  Medications   doxycycline (VIBRA-TABS) 100 MG tablet   clobetasol cream (TEMOVATE) 0.05 %   On continuous oral anticoagulation       Relevant Orders   CBC with Differential/Platelet        Meds ordered this encounter  Medications   insulin detemir (LEVEMIR) 100 UNIT/ML injection    Sig: Inject 0.25 mLs (25 Units total) into the skin daily.    Dispense:  10 mL    Refill:  3   Insulin Syringe-Needle U-100 (SURE COMFORT INSULIN SYRINGE) 31G X 5/16" 1 ML MISC    Sig: USE AS DIRECTED.    Dispense:  100 each    Refill:  11   doxycycline (VIBRA-TABS) 100 MG tablet    Sig: Take 1 tablet (100 mg total) by mouth 2 (two) times daily.  Dispense:  14 tablet    Refill:  0   clobetasol cream (TEMOVATE) 0.05 %    Sig: Apply 1 application topically 2 (two) times daily.    Dispense:  30 g    Refill:  0    Follow-up: Return in about 4 months (around 02/11/2022) for Annual physical.    Lindell Spar, MD

## 2021-10-12 NOTE — Patient Instructions (Addendum)
Please continue to take medications as prescribed. ? ?Please take Tylenol for ankle pain. Okay to apply Voltaren gel for arthritis. ? ?Please avoid taking Ibuprofen or Aleve for pain as you take Eliquis. ? ?Please get fasting blood tests done before the next visit. ?

## 2021-10-12 NOTE — Assessment & Plan Note (Signed)
BP Readings from Last 1 Encounters:  ?10/12/21 (!) 150/74  ? ?Elevated likely due to dental pain and arthritic pain ?Usually well-controlled with Losartan ?Counseled for compliance with the medications ?Advised DASH diet and moderate exercise/walking, at least 150 mins/week ? ?

## 2021-10-12 NOTE — Assessment & Plan Note (Signed)
Chronic right ankle pain, has had traumatic injury and has had surgical exploration of the right ankle in the past ?Advised to use Tylenol and Voltaren gel as needed ?

## 2021-10-12 NOTE — Addendum Note (Signed)
Addended by: Zacarias Pontes R on: 10/12/2021 09:20 AM ? ? Modules accepted: Orders ? ?

## 2021-10-12 NOTE — Assessment & Plan Note (Signed)
Ordered PSA after discussing its limitations for prostate cancer screening, including false positive results leading additional investigations. 

## 2021-10-12 NOTE — Assessment & Plan Note (Signed)
Lab Results  ?Component Value Date  ? HGBA1C 7.3 (H) 06/14/2021  ? ?On Levemir 25 U qHS and Janumet BID ?Offered Levemir flexpen, but patient prefers to inject insulin with syringe ?Advised to follow diabetic diet ?On ACEi ?F/u CMP and lipid panel - plan to add statin ?Diabetic eye exam: Advised to follow up with Ophthalmology for diabetic eye exam ?

## 2021-11-06 ENCOUNTER — Ambulatory Visit: Payer: Medicaid Other

## 2021-11-06 ENCOUNTER — Other Ambulatory Visit: Payer: Self-pay

## 2021-11-06 LAB — HM DIABETES EYE EXAM

## 2021-12-24 ENCOUNTER — Telehealth: Payer: Self-pay

## 2021-12-24 NOTE — Telephone Encounter (Signed)
Patient asked if nurse will give him a call about his last eye exam here in the office. Call back # [phone]. ?

## 2021-12-24 NOTE — Telephone Encounter (Signed)
Pt wanted to know results of eye exam that was done here in office. Was scanned and documented but no one let him know results. Let him know negative.  ?

## 2022-01-07 ENCOUNTER — Other Ambulatory Visit: Payer: Self-pay | Admitting: Internal Medicine

## 2022-01-07 DIAGNOSIS — E1165 Type 2 diabetes mellitus with hyperglycemia: Secondary | ICD-10-CM

## 2022-01-08 ENCOUNTER — Other Ambulatory Visit: Payer: Self-pay | Admitting: *Deleted

## 2022-01-08 ENCOUNTER — Telehealth: Payer: Self-pay | Admitting: Internal Medicine

## 2022-01-08 DIAGNOSIS — I10 Essential (primary) hypertension: Secondary | ICD-10-CM

## 2022-01-08 MED ORDER — LOSARTAN POTASSIUM 100 MG PO TABS: 100.0000 mg | ORAL_TABLET | Freq: Every day | ORAL | 1 refills | Status: DC

## 2022-01-08 MED ORDER — AMLODIPINE BESYLATE 10 MG PO TABS: 10.0000 mg | ORAL_TABLET | Freq: Every day | ORAL | 0 refills | Status: DC

## 2022-01-08 NOTE — Telephone Encounter (Signed)
Pt came into office stating that he is needing refills on his medications to hold him over to his appt in July. Wants to know if you can please refill Janumet 50-'100mg'$ , Losartain '100mg'$  and Amlodipine '10mg'$ ? He will be out of the medications tonight!!!!    [facility]

## 2022-01-08 NOTE — Telephone Encounter (Signed)
Medications sent to pharmacy

## 2022-01-15 ENCOUNTER — Emergency Department (HOSPITAL_COMMUNITY)
Admission: EM | Admit: 2022-01-15 | Discharge: 2022-01-15 | Disposition: A | Discharge status: Home / Self Care | Payer: Medicaid Other | Attending: Emergency Medicine | Admitting: Emergency Medicine

## 2022-01-15 ENCOUNTER — Encounter (HOSPITAL_COMMUNITY): Payer: Self-pay

## 2022-01-15 ENCOUNTER — Emergency Department (HOSPITAL_COMMUNITY): Payer: Medicaid Other

## 2022-01-15 ENCOUNTER — Other Ambulatory Visit: Payer: Self-pay

## 2022-01-15 DIAGNOSIS — M130 Polyarthritis, unspecified: Secondary | ICD-10-CM | POA: Insufficient documentation

## 2022-01-15 DIAGNOSIS — M7989 Other specified soft tissue disorders: Secondary | ICD-10-CM | POA: Diagnosis not present

## 2022-01-15 DIAGNOSIS — M19032 Primary osteoarthritis, left wrist: Secondary | ICD-10-CM | POA: Diagnosis not present

## 2022-01-15 DIAGNOSIS — M2550 Pain in unspecified joint: Secondary | ICD-10-CM | POA: Diagnosis present

## 2022-01-15 DIAGNOSIS — M19111 Post-traumatic osteoarthritis, right shoulder: Secondary | ICD-10-CM | POA: Diagnosis not present

## 2022-01-15 DIAGNOSIS — Z7901 Long term (current) use of anticoagulants: Secondary | ICD-10-CM | POA: Diagnosis not present

## 2022-01-15 MED ORDER — PREDNISONE 50 MG PO TABS
60.0000 mg | ORAL_TABLET | ORAL | Status: AC
  Administered 2022-01-15: 60 mg via ORAL
  Filled 2022-01-15: qty 1

## 2022-01-15 MED ORDER — PREDNISONE 20 MG PO TABS: 40.0000 mg | ORAL_TABLET | Freq: Every day | ORAL | 0 refills | Status: DC

## 2022-01-15 NOTE — ED Triage Notes (Addendum)
Pt states he had a tick bite approx. 3 months ago, and is now having joint pain. Pt went to PCP about this and was given cream for joint pain and antibiotics to cover for tick bite. Pt states the pain in joints has gotten worse and is worried he has lyme disease.

## 2022-01-15 NOTE — Discharge Instructions (Signed)
As discussed, your ongoing joint pain requires continued outpatient follow-up.  However, today's emergency department evaluation was generally reassuring.  Please take all medication as prescribed and follow-up with both your primary care physician and our orthopedic colleagues.

## 2022-01-15 NOTE — ED Provider Notes (Signed)
[facility] Provider Note   CSN: [id number] Arrival date & time: 01/15/22  3474     History  Chief Complaint  Patient presents with   Joint Pain    Jeremy Burgess is a 56 y.o. male.  HPI Patient presents with concern of pain in multiple areas.  History is notable for being crushed by a tree several years ago, and being on disability secondary to that, with ongoing pain in multiple joints.  History is also notable for patient sustaining a tick bite about 3 months ago, subsequently completing course of antibiotics, doxycycline.  He notes that he continues to have occasional pain in his joints, currently worse in the left wrist, but present in the right wrist, and his knees as well.  No fever, erythema in any joint, warmth, or complete inability to move any extremity.  No nausea, vomiting.  He has not seen an orthopedist nor his primary care physician.    Home Medications Prior to Admission medications   Medication Sig Start Date End Date Taking? Authorizing Provider  predniSONE (DELTASONE) 20 MG tablet Take 2 tablets (40 mg total) by mouth daily with breakfast. For the next four days 01/15/22  Yes Carmin Muskrat, MD  amLODipine (NORVASC) 10 MG tablet Take 1 tablet (10 mg total) by mouth daily. 01/08/22   Lindell Spar, MD  apixaban (ELIQUIS) 5 MG TABS tablet Take 1 tablet (5 mg total) by mouth 2 (two) times daily. 09/07/21   Arnoldo Lenis, MD  Cholecalciferol (VITAMIN D-3) 125 MCG (5000 UT) TABS Take 3 tablets by mouth daily.    [provider]  clobetasol cream (TEMOVATE) 2.59 % Apply 1 application topically 2 (two) times daily. 10/12/21   Lindell Spar, MD  doxycycline (VIBRA-TABS) 100 MG tablet Take 1 tablet (100 mg total) by mouth 2 (two) times daily. 10/12/21   Lindell Spar, MD  glucose blood (ACCU-CHEK GUIDE) test strip USE TO TEST TWICE DAILY. 06/14/21   Lindell Spar, MD  insulin detemir (LEVEMIR) 100 UNIT/ML injection Inject 0.25 mLs (25  Units total) into the skin daily. 10/12/21   Lindell Spar, MD  Insulin Syringe-Needle U-100 (SURE COMFORT INSULIN SYRINGE) 31G X 5/16" 1 ML MISC USE AS DIRECTED. 10/12/21   Lindell Spar, MD  JANUMET XR 50-1000 MG TB24 Take 2 tablets by mouth once daily 01/08/22   Lindell Spar, MD  losartan (COZAAR) 100 MG tablet Take 1 tablet (100 mg total) by mouth daily. 01/08/22   Lindell Spar, MD      Allergies    Bee venom and Lisinopril    Review of Systems   Review of Systems  All other systems reviewed and are negative.  Physical Exam Updated Vital Signs BP (!) 145/79 (BP Location: Right Arm)   Pulse 82   Temp 99.1 F (37.3 C) (Oral)   Resp 16   Ht '5\' 11"'$  (1.803 m)   Wt 111.1 kg   SpO2 96%   BMI 34.17 kg/m  Physical Exam Vitals and nursing note reviewed.  Constitutional:      General: He is not in acute distress.    Appearance: He is well-developed.  HENT:     Head: Normocephalic and atraumatic.  Eyes:     Conjunctiva/sclera: Conjunctivae normal.  Cardiovascular:     Rate and Rhythm: Normal rate and regular rhythm.  Pulmonary:     Effort: Pulmonary effort is normal. No respiratory distress.     Breath sounds:  No stridor.  Abdominal:     General: There is no distension.  Musculoskeletal:     Comments: Both wrists with pain with range of motion exercises, but he grossly can flex and extend each, as well as all digits on both.  Left wrist with swelling diffusely, no erythema, warmth.  Skin:    General: Skin is warm and dry.  Neurological:     Mental Status: He is alert and oriented to person, place, and time.    ED Results / Procedures / Treatments   Labs (all labs ordered are listed, but only abnormal results are displayed) Labs Reviewed - No data to display  EKG None  Radiology DG Wrist Complete Left  Result Date: 01/15/2022 CLINICAL DATA:  Pain, swelling and limited range of motion. Tick bite 3 months ago. EXAM: LEFT WRIST - COMPLETE 3+ VIEW COMPARISON:   Radiographs 05/24/2018 FINDINGS: The joint spaces are maintained. No acute bony findings or destructive bony changes. Mild degenerative changes involving the wrist and CMC joint of the thumb. Advanced calcification involving the radial artery appears stable. IMPRESSION: 1. No acute bony findings. 2. Mild degenerative changes. Electronically Signed   By: Marijo Sanes M.D.   On: 01/15/2022 07:57    Procedures Procedures    Medications Ordered in ED Medications  predniSONE (DELTASONE) tablet 60 mg (has no administration in time range)    ED Course/ Medical Decision Making/ A&P This patient with a Hx of Lyme disease, presumably, on disability for pain secondary to prior tree crush injury presents to the ED for concern of polyarthralgia, this involves an extensive number of treatment options, and is a complaint that carries with it a high risk of complications and morbidity.    The differential diagnosis includes polyarthralgia, possibly secondary to prior infection disease, polyarthritis secondary to intrinsic process, less likely diffuse septic joints   Social Determinants of Health:  On disability    After the initial evaluation, orders, including: X-ray left wrist given more swelling in the swollen area compared to other joints were initiated.   On repeat evaluation of the patient stayed the same   Imaging Studies ordered:  I independently visualized and interpreted imaging which showed unremarkable x-ray of the wrist I agree with the radiologist interpretation   Dispostion / Final MDM:  After consideration of the diagnostic results and the patient's response to treatment, patient is appropriate for discharge with initiation of steroids for polyarthralgia.  No evidence for systemic processes with no fever, no hypotension, no systemic complaints.  Patient does have pain in multiple joints, this may be reactive, lower suspicion for gout given the polyarthralgia, though this remains  a consideration as well, though less like likely.  Patient appropriate for initiation of steroids, outpatient orthopedics and primary care follow-up.  Final Clinical Impression(s) / ED Diagnoses Final diagnoses:  Polyarthralgia    Rx / DC Orders ED Discharge Orders          Ordered    predniSONE (DELTASONE) 20 MG tablet  Daily with breakfast        01/15/22 0908              Carmin Muskrat, MD 01/15/22 [phone]

## 2022-01-15 NOTE — ED Notes (Signed)
Ok by RN to give pt [employer]

## 2022-02-11 ENCOUNTER — Other Ambulatory Visit: Payer: Self-pay

## 2022-02-11 ENCOUNTER — Encounter (HOSPITAL_COMMUNITY): Payer: Self-pay | Admitting: *Deleted

## 2022-02-11 ENCOUNTER — Emergency Department (HOSPITAL_COMMUNITY)
Admission: EM | Admit: 2022-02-11 | Discharge: 2022-02-11 | Disposition: A | Discharge status: Home / Self Care | Payer: Medicaid Other | Attending: Emergency Medicine | Admitting: Emergency Medicine

## 2022-02-11 ENCOUNTER — Emergency Department (HOSPITAL_COMMUNITY): Payer: Medicaid Other

## 2022-02-11 DIAGNOSIS — I10 Essential (primary) hypertension: Secondary | ICD-10-CM | POA: Insufficient documentation

## 2022-02-11 DIAGNOSIS — K5730 Diverticulosis of large intestine without perforation or abscess without bleeding: Secondary | ICD-10-CM | POA: Diagnosis not present

## 2022-02-11 DIAGNOSIS — Z7901 Long term (current) use of anticoagulants: Secondary | ICD-10-CM | POA: Diagnosis not present

## 2022-02-11 DIAGNOSIS — Z794 Long term (current) use of insulin: Secondary | ICD-10-CM | POA: Diagnosis not present

## 2022-02-11 DIAGNOSIS — Z79899 Other long term (current) drug therapy: Secondary | ICD-10-CM | POA: Diagnosis not present

## 2022-02-11 DIAGNOSIS — E119 Type 2 diabetes mellitus without complications: Secondary | ICD-10-CM | POA: Insufficient documentation

## 2022-02-11 DIAGNOSIS — N3001 Acute cystitis with hematuria: Secondary | ICD-10-CM

## 2022-02-11 DIAGNOSIS — I4891 Unspecified atrial fibrillation: Secondary | ICD-10-CM | POA: Diagnosis not present

## 2022-02-11 DIAGNOSIS — Z7984 Long term (current) use of oral hypoglycemic drugs: Secondary | ICD-10-CM | POA: Insufficient documentation

## 2022-02-11 DIAGNOSIS — D72829 Elevated white blood cell count, unspecified: Secondary | ICD-10-CM | POA: Diagnosis not present

## 2022-02-11 DIAGNOSIS — I700 Atherosclerosis of aorta: Secondary | ICD-10-CM | POA: Diagnosis not present

## 2022-02-11 DIAGNOSIS — R109 Unspecified abdominal pain: Secondary | ICD-10-CM | POA: Diagnosis present

## 2022-02-11 DIAGNOSIS — Z9049 Acquired absence of other specified parts of digestive tract: Secondary | ICD-10-CM | POA: Diagnosis not present

## 2022-02-11 LAB — URINALYSIS, ROUTINE W REFLEX MICROSCOPIC
Bilirubin Urine: NEGATIVE
Glucose, UA: NEGATIVE mg/dL
Ketones, ur: NEGATIVE mg/dL
Nitrite: NEGATIVE
Protein, ur: 100 mg/dL — AB
RBC / HPF: >50 RBC/hpf — ABNORMAL HIGH (ref 0–5)
Specific Gravity, Urine: 1.023 (ref 1.005–1.030)
pH: 5 (ref 5.0–8.0)

## 2022-02-11 LAB — CBC WITH DIFFERENTIAL/PLATELET
Abs Immature Granulocytes: 0.03 10*3/uL (ref 0.00–0.07)
Basophils Absolute: 0.1 10*3/uL (ref 0.0–0.1)
Basophils Relative: 0 %
Eosinophils Absolute: 0 10*3/uL (ref 0.0–0.5)
Eosinophils Relative: 0 %
HCT: 38.9 % — ABNORMAL LOW (ref 39.0–52.0)
Hemoglobin: 12.8 g/dL — ABNORMAL LOW (ref 13.0–17.0)
Immature Granulocytes: 0 %
Lymphocytes Relative: 14 %
Lymphs Abs: 1.7 10*3/uL (ref 0.7–4.0)
MCH: 28.3 pg (ref 26.0–34.0)
MCHC: 32.9 g/dL (ref 30.0–36.0)
MCV: 86.1 fL (ref 80.0–100.0)
Monocytes Absolute: 0.8 10*3/uL (ref 0.1–1.0)
Monocytes Relative: 7 %
Neutro Abs: 9.3 10*3/uL — ABNORMAL HIGH (ref 1.7–7.7)
Neutrophils Relative %: 79 %
Platelets: 266 10*3/uL (ref 150–400)
RBC: 4.52 MIL/uL (ref 4.22–5.81)
RDW: 13.1 % (ref 11.5–15.5)
WBC: 11.9 10*3/uL — ABNORMAL HIGH (ref 4.0–10.5)
nRBC: 0 % (ref 0.0–0.2)

## 2022-02-11 LAB — BASIC METABOLIC PANEL
Anion gap: 11 (ref 5–15)
BUN: 29 mg/dL — ABNORMAL HIGH (ref 6–20)
CO2: 23 mmol/L (ref 22–32)
Calcium: 9.4 mg/dL (ref 8.9–10.3)
Chloride: 107 mmol/L (ref 98–111)
Creatinine, Ser: 2.06 mg/dL — ABNORMAL HIGH (ref 0.61–1.24)
GFR, Estimated: 37 mL/min — ABNORMAL LOW (ref >60)
Glucose, Bld: 169 mg/dL — ABNORMAL HIGH (ref 70–99)
Potassium: 4.3 mmol/L (ref 3.5–5.1)
Sodium: 141 mmol/L (ref 135–145)

## 2022-02-11 MED ORDER — SODIUM CHLORIDE 0.9 % IV SOLN
1.0000 g | Freq: Once | INTRAVENOUS | Status: AC
  Administered 2022-02-11: 1 g via INTRAVENOUS
  Filled 2022-02-11: qty 10

## 2022-02-11 MED ORDER — CEPHALEXIN 500 MG PO CAPS: 500.0000 mg | ORAL_CAPSULE | Freq: Four times a day (QID) | ORAL | 0 refills | Status: DC

## 2022-02-11 MED ORDER — SODIUM CHLORIDE 0.9 % IV BOLUS
1000.0000 mL | Freq: Once | INTRAVENOUS | Status: AC
  Administered 2022-02-11: 1000 mL via INTRAVENOUS

## 2022-02-11 NOTE — ED Provider Triage Note (Signed)
Emergency Medicine Provider Triage Evaluation Note  Jeremy Burgess , a 56 y.o. male  was evaluated in triage.  Pt complains of left flank pain radiating to left testicle, blood in his urine..  Review of Systems  Positive: Flank pain Negative: fever  Physical Exam  BP 138/72 (BP Location: Right Arm)   Pulse 72   Temp 98.3 F (36.8 C) (Oral)   Resp 17   Ht 1.803 m ('5\' 11"'$ )   Wt 111.1 kg   SpO2 97%   BMI 34.17 kg/m  Gen:   Awake, no distress   Resp:  Normal effort  MSK:   Moves extremities without difficulty  Other:    Medical Decision Making  Medically screening exam initiated at 4:57 PM.  Appropriate orders placed.  Leron Croak was informed that the remainder of the evaluation will be completed by another provider, this initial triage assessment does not replace that evaluation, and the importance of remaining in the ED until their evaluation is complete.  Labs reviewed creatinine elevated.  Will CT to evaluate for stone, obstruction.  Will order fluids.  Pt notified to provide urine sample when he is able   Dorie Rank, MD 02/11/22 1658

## 2022-02-11 NOTE — ED Triage Notes (Signed)
Pt with left flank pain that started last night.  Denies burning on urination.  +hematuria

## 2022-02-11 NOTE — Discharge Instructions (Addendum)
As we discussed, your work-up in the ER was reassuring for acute abnormalities.  Imaging and laboratory evaluation was unremarkable for emergent concerns.  Your urine did show some signs of infection which could very well be the cause of the blood as well as your pain.  I have therefore given you a dose of antibiotics in the emergency department this evening and discharged you with a prescription for additional antibiotics for you to take as prescribed.  Additionally, it is very important that you maintain adequate hydration at home to restore your kidney function.  You also need to have your creatinine rechecked at your doctor's appointment on Wednesday to ensure that it has returned to normal.  Return if development of any new or worsening symptoms.

## 2022-02-11 NOTE — ED Provider Notes (Signed)
[facility] EMERGENCY DEPARTMENT Provider Note   CSN: [id number] Arrival date & time: 02/11/22  1410     History  Chief Complaint  Patient presents with   Flank Pain    Jeremy Burgess is a 56 y.o. male.  Patient with afib on Elliquis, T2DM, hypertension presents today with complaints of left flank pain. He states that same began suddenly last night and was severe last night and through the night and then got better today but is still present. Also endorses gross hematuria last night with darker urine today. He states that the pain radiates into his left testicle. No history of similar pain in the past. He denies any nausea, vomiting, or diarrhea. No recent illness.  The history is provided by the patient. No language interpreter was used.  Flank Pain       Home Medications Prior to Admission medications   Medication Sig Start Date End Date Taking? Authorizing Provider  amLODipine (NORVASC) 10 MG tablet Take 1 tablet (10 mg total) by mouth daily. 01/08/22   Lindell Spar, MD  apixaban (ELIQUIS) 5 MG TABS tablet Take 1 tablet (5 mg total) by mouth 2 (two) times daily. 09/07/21   Arnoldo Lenis, MD  Cholecalciferol (VITAMIN D-3) 125 MCG (5000 UT) TABS Take 3 tablets by mouth daily.    [provider]  clobetasol cream (TEMOVATE) 0.93 % Apply 1 application topically 2 (two) times daily. 10/12/21   Lindell Spar, MD  doxycycline (VIBRA-TABS) 100 MG tablet Take 1 tablet (100 mg total) by mouth 2 (two) times daily. 10/12/21   Lindell Spar, MD  glucose blood (ACCU-CHEK GUIDE) test strip USE TO TEST TWICE DAILY. 06/14/21   Lindell Spar, MD  insulin detemir (LEVEMIR) 100 UNIT/ML injection Inject 0.25 mLs (25 Units total) into the skin daily. 10/12/21   Lindell Spar, MD  Insulin Syringe-Needle U-100 (SURE COMFORT INSULIN SYRINGE) 31G X 5/16" 1 ML MISC USE AS DIRECTED. 10/12/21   Lindell Spar, MD  JANUMET XR 50-1000 MG TB24 Take 2 tablets by mouth once daily 01/08/22    Lindell Spar, MD  losartan (COZAAR) 100 MG tablet Take 1 tablet (100 mg total) by mouth daily. 01/08/22   Lindell Spar, MD  predniSONE (DELTASONE) 20 MG tablet Take 2 tablets (40 mg total) by mouth daily with breakfast. For the next four days 01/15/22   Carmin Muskrat, MD      Allergies    Bee venom and Lisinopril    Review of Systems   Review of Systems  Genitourinary:  Positive for flank pain.  All other systems reviewed and are negative.   Physical Exam Updated Vital Signs BP (!) 109/53   Pulse (!) 54   Temp 98.3 F (36.8 C) (Oral)   Resp 19   Ht '5\' 11"'$  (1.803 m)   Wt 111.1 kg   SpO2 99%   BMI 34.17 kg/m  Physical Exam Vitals and nursing note reviewed.  Constitutional:      General: He is not in acute distress.    Appearance: Normal appearance. He is normal weight. He is not ill-appearing, toxic-appearing or diaphoretic.  HENT:     Head: Normocephalic and atraumatic.  Cardiovascular:     Rate and Rhythm: Normal rate.  Pulmonary:     Effort: Pulmonary effort is normal. No respiratory distress.  Abdominal:     General: Abdomen is flat.     Palpations: Abdomen is soft.     Tenderness:  There is no abdominal tenderness. There is no left CVA tenderness.  Musculoskeletal:        General: Normal range of motion.     Cervical back: Normal range of motion.  Skin:    General: Skin is warm and dry.  Neurological:     General: No focal deficit present.     Mental Status: He is alert.  Psychiatric:        Mood and Affect: Mood normal.        Behavior: Behavior normal.     ED Results / Procedures / Treatments   Labs (all labs ordered are listed, but only abnormal results are displayed) Labs Reviewed  URINALYSIS, ROUTINE W REFLEX MICROSCOPIC - Abnormal; Notable for the following components:      Result Value   Color, Urine AMBER (*)    APPearance CLOUDY (*)    Hgb urine dipstick LARGE (*)    Protein, ur 100 (*)    Leukocytes,Ua TRACE (*)    RBC / HPF >50 (*)     Bacteria, UA RARE (*)    All other components within normal limits  CBC WITH DIFFERENTIAL/PLATELET - Abnormal; Notable for the following components:   WBC 11.9 (*)    Hemoglobin 12.8 (*)    HCT 38.9 (*)    Neutro Abs 9.3 (*)    All other components within normal limits  BASIC METABOLIC PANEL - Abnormal; Notable for the following components:   Glucose, Bld 169 (*)    BUN 29 (*)    Creatinine, Ser 2.06 (*)    GFR, Estimated 37 (*)    All other components within normal limits    EKG None  Radiology CT Renal Stone Study  Result Date: 02/11/2022 CLINICAL DATA:  Flank pain, kidney stone suspected EXAM: CT ABDOMEN AND PELVIS WITHOUT CONTRAST TECHNIQUE: Multidetector CT imaging of the abdomen and pelvis was performed following the standard protocol without IV contrast. RADIATION DOSE REDUCTION: This exam was performed according to the departmental dose-optimization program which includes automated exposure control, adjustment of the mA and/or kV according to patient size and/or use of iterative reconstruction technique. COMPARISON:  None Available. FINDINGS: Lower chest: Linear atelectasis versus scarring. Coronary artery calcification. No acute abnormality. Likely old healed right rib fracture. Hepatobiliary: No focal liver abnormality. Status post cholecystectomy. No biliary dilatation. Pancreas: No focal lesion. Normal pancreatic contour. No surrounding inflammatory changes. No main pancreatic ductal dilatation. Spleen: Normal in size without focal abnormality. Adrenals/Urinary Tract: No adrenal nodule bilaterally. No nephrolithiasis and no hydronephrosis. No definite contour-deforming renal mass. No ureterolithiasis or hydroureter. The urinary bladder is unremarkable. Stomach/Bowel: Stomach is within normal limits. No evidence of bowel wall thickening or dilatation. Scattered colonic diverticulosis. Appendix appears normal. Vascular/Lymphatic: No abdominal aorta or iliac aneurysm. Mild  atherosclerotic plaque of the aorta and its branches. No abdominal, pelvic, or inguinal lymphadenopathy. Reproductive: Prostate is unremarkable. Other: No intraperitoneal free fluid. No intraperitoneal free gas. No organized fluid collection. Musculoskeletal: No abdominal wall hernia or abnormality. No suspicious lytic or blastic osseous lesions. No acute displaced fracture. Multilevel degenerative changes of the spine. Intramedullary nail fixation of the right femur with partially visualized chronic periosteal reaction. IMPRESSION: Colonic diverticulosis with no acute diverticulitis. Electronically Signed   By: Iven Finn M.D.   On: 02/11/2022 17:41    Procedures Procedures    Medications Ordered in ED Medications  sodium chloride 0.9 % bolus 1,000 mL (1,000 mLs Intravenous New Bag/Given 02/11/22 2140)  cefTRIAXone (ROCEPHIN) 1 g in  sodium chloride 0.9 % 100 mL IVPB (1 g Intravenous New Bag/Given 02/11/22 2141)    ED Course/ Medical Decision Making/ A&P                           Medical Decision Making Amount and/or Complexity of Data Reviewed Labs: ordered. Radiology: ordered.   This patient presents to the ED for concern of flank pain, this involves an extensive number of treatment options, and is a complaint that carries with it a high risk of complications and morbidity.   Lab Tests:  I Ordered, and personally interpreted labs.  The pertinent results include:  WBC 11.9, hemoglobin 12.8. Creatinine 2.06 up from 1.17 8 months ago. UA with hemoglobin, proteinuria, trace leukocytes, 11-20 WBCs and rare bacteria   Imaging Studies ordered:  I ordered imaging studies including CT renal  I independently visualized and interpreted imaging which showed  Colonic diverticulosis with no acute diverticulitis  I agree with the radiologist interpretation   Problem List / ED Course / Critical interventions / Medication management  I ordered medication including rocephin and fluids  for  UTI and dehydration  Reevaluation of the patient after these medicines showed that the patient improved I have reviewed the patients home medicines and have made adjustments as needed   Test / Admission - Considered:  Patient presents today with flank pain and hematuria.  He is afebrile, nontoxic-appearing, and in no acute distress with reassuring vital signs.  Physical exam reveals no CVA tenderness or palpable abdominal pain.  Urine did show some signs of infection and therefore he was treated with Rocephin and will be discharged with Keflex.  Some concerns related to the patient's increase in creatinine, was rehydrated in the ER today.  Given his proteinuria and hematuria with increasing creatinine, some thought that this could be glomerulonephritis or other intrarenal cause of symptoms, however he has not been sick recently and has no clinical signs of this.  Suspect that this symptoms could be due to a kidney stone that passed last night and will therefore improve on its own.  He does have an appointment with his PCP on Wednesday. Given this with work-up that is overall not concerning for emergent findings, will discharge with antibiotics and emphasized the importance of having his creatinine rechecked at his appointment on Wednesday.  If his creatinine continues to be elevated he will likely need a nephrology referral for further evaluation.  He is stable for discharge at this time, educated on red flag symptoms that would prompt immediate return.  Discharged in stable condition.   This is a shared visit with supervising physician Dr. Tomi Bamberger who has independently evaluated patient & provided guidance in evaluation/management/disposition, in agreement with care    Final Clinical Impression(s) / ED Diagnoses Final diagnoses:  Acute cystitis with hematuria    Rx / DC Orders ED Discharge Orders          Ordered    cephALEXin (KEFLEX) 500 MG capsule  4 times daily        02/11/22 2330           An After Visit Summary was printed and given to the patient.     Nestor Lewandowsky 02/11/22 2332    Dorie Rank, MD 02/12/22 [phone]

## 2022-02-13 ENCOUNTER — Encounter: Payer: Self-pay | Admitting: Internal Medicine

## 2022-02-13 ENCOUNTER — Ambulatory Visit (INDEPENDENT_AMBULATORY_CARE_PROVIDER_SITE_OTHER): Payer: Medicaid Other | Admitting: Internal Medicine

## 2022-02-13 VITALS — BP 136/70 | HR 61 | Resp 18 | Ht 71.0 in | Wt 238.6 lb

## 2022-02-13 DIAGNOSIS — Z794 Long term (current) use of insulin: Secondary | ICD-10-CM

## 2022-02-13 DIAGNOSIS — I483 Typical atrial flutter: Secondary | ICD-10-CM

## 2022-02-13 DIAGNOSIS — E559 Vitamin D deficiency, unspecified: Secondary | ICD-10-CM | POA: Diagnosis not present

## 2022-02-13 DIAGNOSIS — E1169 Type 2 diabetes mellitus with other specified complication: Secondary | ICD-10-CM | POA: Diagnosis not present

## 2022-02-13 DIAGNOSIS — I10 Essential (primary) hypertension: Secondary | ICD-10-CM | POA: Diagnosis not present

## 2022-02-13 DIAGNOSIS — E782 Mixed hyperlipidemia: Secondary | ICD-10-CM

## 2022-02-13 DIAGNOSIS — Z125 Encounter for screening for malignant neoplasm of prostate: Secondary | ICD-10-CM | POA: Diagnosis not present

## 2022-02-13 DIAGNOSIS — Z23 Encounter for immunization: Secondary | ICD-10-CM

## 2022-02-13 DIAGNOSIS — Z0001 Encounter for general adult medical examination with abnormal findings: Secondary | ICD-10-CM | POA: Diagnosis not present

## 2022-02-13 DIAGNOSIS — E1165 Type 2 diabetes mellitus with hyperglycemia: Secondary | ICD-10-CM | POA: Diagnosis not present

## 2022-02-13 DIAGNOSIS — Z7901 Long term (current) use of anticoagulants: Secondary | ICD-10-CM | POA: Diagnosis not present

## 2022-02-13 MED ORDER — JANUMET XR 50-1000 MG PO TB24
2.0000 | ORAL_TABLET | Freq: Every day | ORAL | 3 refills | Status: DC
Start: 2022-02-13 — End: 2022-12-18

## 2022-02-13 MED ORDER — ATORVASTATIN CALCIUM 10 MG PO TABS: 10.0000 mg | ORAL_TABLET | Freq: Every day | ORAL | 3 refills | Status: DC

## 2022-02-13 MED ORDER — LOSARTAN POTASSIUM 100 MG PO TABS: 100.0000 mg | ORAL_TABLET | Freq: Every day | ORAL | 3 refills | Status: DC

## 2022-02-13 MED ORDER — AMLODIPINE BESYLATE 10 MG PO TABS: 10.0000 mg | ORAL_TABLET | Freq: Every day | ORAL | 3 refills | Status: DC

## 2022-02-13 NOTE — Patient Instructions (Signed)
Please start taking Atorvastatin as prescribed.  Please continue taking medications as prescribed.  Please continue to follow low carb diet and ambulate as tolerated.  Please maintain at least 64 ounces of fluid intake in a day.

## 2022-02-13 NOTE — Progress Notes (Signed)
Established Patient Office Visit  Subjective:  Patient ID: Jeremy Burgess, male    DOB: 04-15-1966  Age: 56 y.o. MRN: [id number]  CC:  Chief Complaint  Patient presents with   Annual Exam    Annual exam if blood work is checked pt went to ed for kidney stone was told to have pcp check kidney function    HPI Jeremy Burgess is a 56 y.o. male with past medical history of HTN, atrial flutter, type II DM with neuropathy, chronic low back pain, chronic right ankle pain due to trauma and obesity who presents for annual physical.  He was recently seen in ER for flank pain and materia.  His CT abdomen was negative for nephrolithiasis or hydronephrosis.  He also had AKI, but he denies any dysuria, hematuria, urinary hesitancy or resistance currently.  Denies any fever, chills, nausea vomiting currently.  He is currently on Keflex for UTI.  Type II DM: His HbA1c was 7.7 in 03/23.  He takes Levemir 25 U qHS and Janumet twice daily.  He prefers to take insulin with syringe, and does not want flexpen for now. He denies any polyuria or polydipsia or polyphagia. He has not brought his glucometer log today.  HTN: BP is well-controlled. Takes medications regularly. Patient denies headache, dizziness, chest pain, dyspnea or palpitations.  He received first dose of Shingrix vaccine today.  Past Medical History:  Diagnosis Date   Accidentally struck by falling tree 02/08/2015   Acute blood loss anemia 02/09/2015   Acute respiratory failure ([place]) 02/09/2015   Acute respiratory failure ([place]) 02/09/2015   Ankle fracture, right 02/09/2015   Arthritis    Asthma    Atrial flutter ([place])    a. diagnosed in 08/2018 b. s/p DCCV in 09/2018 with return to NSR   Chronic pain due to trauma    Closed fracture of right ulna 02/08/2015   Closed right pilon fracture 02/23/2015   Diabetes mellitus    Diabetes mellitus ([place])    Femur fracture, right ([place]) 02/08/2015   Heart murmur    History of blood clots    DVT    Hypertension    Injury of hand, extensor tendon, right, sequela 09/01/2018   Pneumothorax    Postlaminectomy syndrome, lumbar region 11/13/2015   Rib fracture    Thrombocytopenia ([place]) 02/09/2015   Traumatic closed displaced fracture of multiple ribs of right side 02/08/2015   Traumatic hemopneumothorax 02/09/2015   Upper GI bleed 05/26/2016   Upper GI bleeding 05/27/2016   Trivial. EGD unremarkable.    Past Surgical History:  Procedure Laterality Date   BACK SURGERY     CARDIOVERSION N/A 10/06/2018   Procedure: CARDIOVERSION;  Surgeon: [name], MD;  Location: [facility];  Service: Cardiovascular;  Laterality: N/A;   CHOLECYSTECTOMY N/A 10/30/2016   Procedure: LAPAROSCOPIC CHOLECYSTECTOMY;  Surgeon: [name], MD;  Location: [facility];  Service: General;  Laterality: N/A;   COLONOSCOPY N/A 07/25/2016   Dr. [name]: 9 mm tubular adenoma. 5 year surveillance    ESOPHAGOGASTRODUODENOSCOPY  2009   EGD completed by Dr. [name] due to hematemesis. Findings of normal esophagus, tiny antral erosions, normal D1, D2   ESOPHAGOGASTRODUODENOSCOPY N/A 05/27/2016   Dr. [name]: normal    EXTERNAL FIXATION LEG Right 02/09/2015   Procedure: EXTERNAL FIXATION LEG;  Surgeon: [name], MD;  Location: [place];  Service: Orthopedics;  Laterality: Right;   EXTERNAL FIXATION REMOVAL Right 02/14/2015   Procedure: REMOVAL EXTERNAL FIXATION LEG;  Surgeon:  [name], MD;  Location: [place];  Service: Orthopedics;  Laterality: Right;   FEMUR IM NAIL Right 02/09/2015   Procedure: INTRAMEDULLARY (IM) RETROGRADE FEMORAL NAILING;  Surgeon: [name], MD;  Location: [place];  Service: Orthopedics;  Laterality: Right;   NOSE SURGERY     ORIF ANKLE FRACTURE Right 02/14/2015   Procedure: OPEN REDUCTION INTERNAL FIXATION (ORIF) PILON FRACTURE;  Surgeon: [name], MD;  Location: [place];  Service: Orthopedics;  Laterality: Right;   ORIF ULNAR FRACTURE Right 02/09/2015   Procedure: OPEN REDUCTION INTERNAL  FIXATION (ORIF) ULNAR FRACTURE;  Surgeon: [name], MD;  Location: [place];  Service: Orthopedics;  Laterality: Right;   POLYPECTOMY  07/25/2016   Procedure: POLYPECTOMY;  Surgeon: [name], MD;  Location[facility];  Service: Endoscopy;;  colon    Family History  Problem Relation Age of Onset   Diabetes Mother    COPD Mother    CAD Father        CABG in his 68's   Diabetes Sister    Atrial fibrillation Brother    Colon cancer Neg Hx     Social History   Socioeconomic History   Marital status: Single    Spouse name: Not on file   Number of children: Not on file   Years of education: Not on file   Highest education level: Not on file  Occupational History   Not on file  Tobacco Use   Smoking status: Never   Smokeless tobacco: Never  Vaping Use   Vaping Use: Never used  Substance and Sexual Activity   Alcohol use: No    Comment: history of alcohol abuse but quit in 2008    Drug use: No   Sexual activity: Yes  Other Topics Concern   Not on file  Social History Narrative       Single.Lives alone.On disability secondary to tree fall accident 2016.   Social Determinants of Health   Financial Resource Strain: Medium Risk (06/27/2020)   Overall Financial Resource Strain (CARDIA)    Difficulty of Paying Living Expenses: Somewhat hard  Food Insecurity: Not on file  Transportation Needs: Not on file  Physical Activity: Not on file  Stress: Not on file  Social Connections: Not on file  Intimate Partner Violence: Not on file    Outpatient Medications Prior to Visit  Medication Sig Dispense Refill   apixaban (ELIQUIS) 5 MG TABS tablet Take 1 tablet (5 mg total) by mouth 2 (two) times daily. 60 tablet 5   cephALEXin (KEFLEX) 500 MG capsule Take 1 capsule (500 mg total) by mouth 4 (four) times daily. 20 capsule 0   Cholecalciferol (VITAMIN D-3) 125 MCG (5000 UT) TABS Take 3 tablets by mouth daily.     clobetasol cream (TEMOVATE) 9.56 % Apply 1 application  topically 2 (two) times daily. 30 g 0   glucose blood (ACCU-CHEK GUIDE) test strip USE TO TEST TWICE DAILY. 200 strip 11   insulin detemir (LEVEMIR) 100 UNIT/ML injection Inject 0.25 mLs (25 Units total) into the skin daily. 10 mL 3   Insulin Syringe-Needle U-100 (SURE COMFORT INSULIN SYRINGE) 31G X 5/16" 1 ML MISC USE AS DIRECTED. 100 each 11   amLODipine (NORVASC) 10 MG tablet Take 1 tablet (10 mg total) by mouth daily. 90 tablet 0   JANUMET XR 50-1000 MG TB24 Take 2 tablets by mouth once daily 180 tablet 0   losartan (COZAAR) 100 MG tablet Take 1 tablet (  100 mg total) by mouth daily. 90 tablet 1   doxycycline (VIBRA-TABS) 100 MG tablet Take 1 tablet (100 mg total) by mouth 2 (two) times daily. (Patient not taking: Reported on 02/13/2022) 14 tablet 0   predniSONE (DELTASONE) 20 MG tablet Take 2 tablets (40 mg total) by mouth daily with breakfast. For the next four days (Patient not taking: Reported on 02/13/2022) 8 tablet 0   No facility-administered medications prior to visit.    Allergies  Allergen Reactions   Bee Venom Anaphylaxis    HONEY BEES   Lisinopril Cough    ROS Review of Systems  Constitutional:  Negative for chills and fever.  HENT:  Negative for congestion and sore throat.   Eyes:  Negative for pain and discharge.  Respiratory:  Negative for cough and shortness of breath.   Cardiovascular:  Negative for chest pain and palpitations.  Gastrointestinal:  Negative for constipation, diarrhea, nausea and vomiting.  Endocrine: Negative for polydipsia and polyuria.  Genitourinary:  Negative for dysuria and hematuria.  Musculoskeletal:  Positive for arthralgias and back pain. Negative for neck pain and neck stiffness.  Skin:  Negative for rash.  Neurological:  Positive for numbness. Negative for dizziness, weakness and headaches.  Psychiatric/Behavioral:  Negative for agitation and behavioral problems.       Objective:    Physical Exam Vitals reviewed.  Constitutional:       General: He is not in acute distress.    Appearance: He is obese. He is not diaphoretic.  HENT:     Head: Normocephalic and atraumatic.     Nose: Nose normal.     Mouth/Throat:     Mouth: Mucous membranes are moist.  Eyes:     General: No scleral icterus.    Extraocular Movements: Extraocular movements intact.  Cardiovascular:     Rate and Rhythm: Normal rate and regular rhythm.     Pulses: Normal pulses.     Heart sounds: Normal heart sounds. No murmur heard. Pulmonary:     Breath sounds: Normal breath sounds. No wheezing or rales.  Abdominal:     Palpations: Abdomen is soft.     Tenderness: There is no abdominal tenderness. There is no right CVA tenderness or left CVA tenderness.  Musculoskeletal:     Cervical back: Neck supple. No tenderness.     Right lower leg: No edema.     Left lower leg: No edema.  Skin:    General: Skin is warm.     Findings: No rash.  Neurological:     General: No focal deficit present.     Mental Status: He is alert and oriented to person, place, and time.     Cranial Nerves: No cranial nerve deficit.     Sensory: Sensory deficit present.     Motor: No weakness.  Psychiatric:        Mood and Affect: Mood normal.        Behavior: Behavior normal.     BP 136/70 (BP Location: Right Arm, Patient Position: Sitting, Cuff Size: Normal)   Pulse 61   Resp 18   Ht '5\' 11"'  (1.803 m)   Wt 238 lb 9.6 oz (108.2 kg)   SpO2 97%   BMI 33.28 kg/m  Wt Readings from Last 3 Encounters:  02/13/22 238 lb 9.6 oz (108.2 kg)  02/11/22 245 lb (111.1 kg)  01/15/22 245 lb (111.1 kg)    Lab Results  Component Value Date   TSH 4.939 (H) 08/31/2018  Lab Results  Component Value Date   WBC 11.9 (H) 02/11/2022   HGB 12.8 (L) 02/11/2022   HCT 38.9 (L) 02/11/2022   MCV 86.1 02/11/2022   PLT 266 02/11/2022   Lab Results  Component Value Date   NA 141 02/11/2022   K 4.3 02/11/2022   CO2 23 02/11/2022   GLUCOSE 169 (H) 02/11/2022   BUN 29 (H) 02/11/2022    CREATININE 2.06 (H) 02/11/2022   BILITOT 0.4 06/14/2021   ALKPHOS 65 06/14/2021   AST 19 06/14/2021   ALT 18 06/14/2021   PROT 7.3 06/14/2021   ALBUMIN 4.6 06/14/2021   CALCIUM 9.4 02/11/2022   ANIONGAP 11 02/11/2022   EGFR 73 06/14/2021   Lab Results  Component Value Date   CHOL 134 06/14/2021   Lab Results  Component Value Date   HDL 41 06/14/2021   Lab Results  Component Value Date   LDLCALC 77 06/14/2021   Lab Results  Component Value Date   TRIG 83 06/14/2021   Lab Results  Component Value Date   CHOLHDL 3.3 06/14/2021   Lab Results  Component Value Date   HGBA1C 7.7 (A) 10/12/2021      Assessment & Plan:   Problem List Items Addressed This Visit       Cardiovascular and Mediastinum   Essential hypertension   Relevant Medications   losartan (COZAAR) 100 MG tablet   amLODipine (NORVASC) 10 MG tablet   atorvastatin (LIPITOR) 10 MG tablet   Typical atrial flutter ([place])    During hospitalization in 2020 Was cardioverted On Eliquis Had bradycardia with Cardizem Followed by Cardiology      Relevant Medications   losartan (COZAAR) 100 MG tablet   amLODipine (NORVASC) 10 MG tablet   atorvastatin (LIPITOR) 10 MG tablet     Endocrine   Type 2 diabetes mellitus with other specified complication (HCC)    Lab Results  Component Value Date   HGBA1C 7.7 (A) 10/12/2021  On Levemir 25 U qHS and Janumet BID Offered Levemir flexpen, but patient prefers to inject insulin with syringe Advised to follow diabetic diet On ACEi and statin now F/u CMP and lipid panel Diabetic eye exam: Advised to follow up with Ophthalmology for diabetic eye exam      Relevant Medications   losartan (COZAAR) 100 MG tablet   JANUMET XR 50-1000 MG TB24   atorvastatin (LIPITOR) 10 MG tablet   Other Relevant Orders   Microalbumin / creatinine urine ratio     Other   Mixed hyperlipidemia    Check lipid profile Added statin      Relevant Medications   losartan (COZAAR) 100  MG tablet   amLODipine (NORVASC) 10 MG tablet   atorvastatin (LIPITOR) 10 MG tablet   Encounter for general adult medical examination with abnormal findings - Primary    Physical exam as documented. Counseling done  re healthy lifestyle involving commitment to 150 minutes exercise per week, heart healthy diet, and attaining healthy weight.The importance of adequate sleep also discussed. Changes in health habits are decided on by the patient with goals and time frames  set for achieving them. Immunization and cancer screening needs are specifically addressed at this visit.      Other Visit Diagnoses     Need for varicella vaccine       Relevant Orders   Varicella-zoster vaccine IM (Shingrix)       Meds ordered this encounter  Medications   losartan (COZAAR) 100 MG tablet  Sig: Take 1 tablet (100 mg total) by mouth daily.    Dispense:  90 tablet    Refill:  3   JANUMET XR 50-1000 MG TB24    Sig: Take 2 tablets by mouth daily.    Dispense:  180 tablet    Refill:  3   amLODipine (NORVASC) 10 MG tablet    Sig: Take 1 tablet (10 mg total) by mouth daily.    Dispense:  90 tablet    Refill:  3   atorvastatin (LIPITOR) 10 MG tablet    Sig: Take 1 tablet (10 mg total) by mouth daily.    Dispense:  90 tablet    Refill:  3    Follow-up: Return in about 4 months (around 06/16/2022) for DM and HTN.    Lindell Spar, MD

## 2022-02-13 NOTE — Assessment & Plan Note (Signed)
During hospitalization in 2020 Was cardioverted On Eliquis Had bradycardia with Cardizem Followed by Cardiology 

## 2022-02-13 NOTE — Assessment & Plan Note (Signed)

## 2022-02-13 NOTE — Assessment & Plan Note (Signed)
Check lipid profile Added statin

## 2022-02-13 NOTE — Assessment & Plan Note (Signed)
Lab Results  Component Value Date   HGBA1C 7.7 (A) 10/12/2021   On Levemir 25 U qHS and Janumet BID Offered Levemir flexpen, but patient prefers to inject insulin with syringe Advised to follow diabetic diet On ACEi and statin now F/u CMP and lipid panel Diabetic eye exam: Advised to follow up with Ophthalmology for diabetic eye exam

## 2022-02-14 LAB — CMP14+EGFR
ALT: 16 IU/L (ref 0–44)
AST: 17 IU/L (ref 0–40)
Albumin/Globulin Ratio: 1.7 (ref 1.2–2.2)
Albumin: 4.5 g/dL (ref 3.8–4.9)
Alkaline Phosphatase: 70 IU/L (ref 44–121)
BUN/Creatinine Ratio: 17 (ref 9–20)
BUN: 21 mg/dL (ref 6–24)
Bilirubin Total: 0.3 mg/dL (ref 0.0–1.2)
CO2: 21 mmol/L (ref 20–29)
Calcium: 10.1 mg/dL (ref 8.7–10.2)
Chloride: 102 mmol/L (ref 96–106)
Creatinine, Ser: 1.22 mg/dL (ref 0.76–1.27)
Globulin, Total: 2.7 g/dL (ref 1.5–4.5)
Glucose: 143 mg/dL — ABNORMAL HIGH (ref 70–99)
Potassium: 4.6 mmol/L (ref 3.5–5.2)
Sodium: 139 mmol/L (ref 134–144)
Total Protein: 7.2 g/dL (ref 6.0–8.5)
eGFR: 69 mL/min/{1.73_m2} (ref >59)

## 2022-02-14 LAB — CBC WITH DIFFERENTIAL/PLATELET
Basophils Absolute: 0.1 10*3/uL (ref 0.0–0.2)
Basos: 1 %
EOS (ABSOLUTE): 0.2 10*3/uL (ref 0.0–0.4)
Eos: 2 %
Hematocrit: 39.2 % (ref 37.5–51.0)
Hemoglobin: 12.8 g/dL — ABNORMAL LOW (ref 13.0–17.7)
Immature Grans (Abs): 0 10*3/uL (ref 0.0–0.1)
Immature Granulocytes: 0 %
Lymphocytes Absolute: 1.5 10*3/uL (ref 0.7–3.1)
Lymphs: 20 %
MCH: 27.6 pg (ref 26.6–33.0)
MCHC: 32.7 g/dL (ref 31.5–35.7)
MCV: 85 fL (ref 79–97)
Monocytes Absolute: 0.7 10*3/uL (ref 0.1–0.9)
Monocytes: 9 %
Neutrophils Absolute: 5.2 10*3/uL (ref 1.4–7.0)
Neutrophils: 68 %
Platelets: 236 10*3/uL (ref 150–450)
RBC: 4.63 x10E6/uL (ref 4.14–5.80)
RDW: 13.1 % (ref 11.6–15.4)
WBC: 7.6 10*3/uL (ref 3.4–10.8)

## 2022-02-14 LAB — TSH: TSH: 2.72 u[IU]/mL (ref 0.450–4.500)

## 2022-02-14 LAB — LIPID PANEL
Chol/HDL Ratio: 3.4 ratio (ref 0.0–5.0)
Cholesterol, Total: 143 mg/dL (ref 100–199)
HDL: 42 mg/dL (ref >39)
LDL Chol Calc (NIH): 85 mg/dL (ref 0–99)
Triglycerides: 85 mg/dL (ref 0–149)
VLDL Cholesterol Cal: 16 mg/dL (ref 5–40)

## 2022-02-14 LAB — VITAMIN D 25 HYDROXY (VIT D DEFICIENCY, FRACTURES): Vit D, 25-Hydroxy: 50.4 ng/mL (ref 30.0–100.0)

## 2022-02-14 LAB — HEMOGLOBIN A1C
Est. average glucose Bld gHb Est-mCnc: 186 mg/dL
Hgb A1c MFr Bld: 8.1 % — ABNORMAL HIGH (ref 4.8–5.6)

## 2022-02-14 LAB — PSA: Prostate Specific Ag, Serum: 2.3 ng/mL (ref 0.0–4.0)

## 2022-02-15 LAB — MICROALBUMIN / CREATININE URINE RATIO
Creatinine, Urine: 149.1 mg/dL
Microalb/Creat Ratio: 43 mg/g creat — ABNORMAL HIGH (ref 0–29)
Microalbumin, Urine: 63.4 ug/mL

## 2022-03-20 ENCOUNTER — Ambulatory Visit (INDEPENDENT_AMBULATORY_CARE_PROVIDER_SITE_OTHER): Payer: Medicaid Other | Admitting: Cardiology

## 2022-03-20 ENCOUNTER — Encounter: Payer: Self-pay | Admitting: Cardiology

## 2022-03-20 VITALS — BP 132/70 | HR 66 | Ht 71.0 in | Wt 241.0 lb

## 2022-03-20 DIAGNOSIS — D6869 Other thrombophilia: Secondary | ICD-10-CM

## 2022-03-20 DIAGNOSIS — I483 Typical atrial flutter: Secondary | ICD-10-CM | POA: Diagnosis not present

## 2022-03-20 DIAGNOSIS — I10 Essential (primary) hypertension: Secondary | ICD-10-CM

## 2022-03-20 NOTE — Patient Instructions (Signed)
Medication Instructions:  Your physician recommends that you continue on your current medications as directed. Please refer to the Current Medication list given to you today.   Labwork: None  Testing/Procedures: None  Follow-Up: Follow up with Dr. Branch in 6 months.   Any Other Special Instructions Will Be Listed Below (If Applicable).     If you need a refill on your cardiac medications before your next appointment, please call your pharmacy.  

## 2022-03-20 NOTE — Progress Notes (Signed)
Clinical Summary Mr. Sjogren is a 56 y.o.male seen today for follow up of the following medical problems.   1. Atrial flutter - new diagnosis during Jan 2020 admission - at that time started on anticoag, later cardioverted 10/06/2018 - during 10/23/2018 visit was still in [place] - currently on eliquis. Cardizem 120 stopped after cardioversion due to some low heart rates.    - no recent symptoms - compliant with meds, no bleeding on eliquis    2. HTN - compliant with meds   3. Chest pain - 05/2020 visit with PA Leonides Sake reported left sided chest pain - 05/2020 nuclear stress: no ischemia.    - no recent symptoms.    4. DM2 - he is on atorvastatin '10mg'$  daily.    02/2022 TC 143 TG 85 HDL 42 LDL 85 Past Medical History:  Diagnosis Date   Accidentally struck by falling tree 02/08/2015   Acute blood loss anemia 02/09/2015   Acute respiratory failure ([place]) 02/09/2015   Acute respiratory failure ([place]) 02/09/2015   Ankle fracture, right 02/09/2015   Arthritis    Asthma    Atrial flutter ([place])    a. diagnosed in 08/2018 b. s/p DCCV in 09/2018 with return to [facility]   Chronic pain due to trauma    Closed fracture of right ulna 02/08/2015   Closed right pilon fracture 02/23/2015   Diabetes mellitus    Diabetes mellitus ([place])    Femur fracture, right ([place]) 02/08/2015   Heart murmur    History of blood clots    DVT   Hypertension    Injury of hand, extensor tendon, right, sequela 09/01/2018   Pneumothorax    Postlaminectomy syndrome, lumbar region 11/13/2015   Rib fracture    Thrombocytopenia ([place]) 02/09/2015   Traumatic closed displaced fracture of multiple ribs of right side 02/08/2015   Traumatic hemopneumothorax 02/09/2015   Upper GI bleed 05/26/2016   Upper GI bleeding 05/27/2016   Trivial. EGD unremarkable.     Allergies  Allergen Reactions   Bee Venom Anaphylaxis    HONEY BEES   Lisinopril Cough     Current Outpatient Medications  Medication Sig Dispense Refill    amLODipine (NORVASC) 10 MG tablet Take 1 tablet (10 mg total) by mouth daily. 90 tablet 3   apixaban (ELIQUIS) 5 MG TABS tablet Take 1 tablet (5 mg total) by mouth 2 (two) times daily. 60 tablet 5   atorvastatin (LIPITOR) 10 MG tablet Take 1 tablet (10 mg total) by mouth daily. 90 tablet 3   cephALEXin (KEFLEX) 500 MG capsule Take 1 capsule (500 mg total) by mouth 4 (four) times daily. 20 capsule 0   Cholecalciferol (VITAMIN D-3) 125 MCG (5000 UT) TABS Take 3 tablets by mouth daily.     clobetasol cream (TEMOVATE) 4.03 % Apply 1 application topically 2 (two) times daily. 30 g 0   glucose blood (ACCU-CHEK GUIDE) test strip USE TO TEST TWICE DAILY. 200 strip 11   insulin detemir (LEVEMIR) 100 UNIT/ML injection Inject 0.25 mLs (25 Units total) into the skin daily. 10 mL 3   Insulin Syringe-Needle U-100 (SURE COMFORT INSULIN SYRINGE) 31G X 5/16" 1 ML MISC USE AS DIRECTED. 100 each 11   JANUMET XR 50-1000 MG TB24 Take 2 tablets by mouth daily. 180 tablet 3   losartan (COZAAR) 100 MG tablet Take 1 tablet (100 mg total) by mouth daily. 90 tablet 3   No current facility-administered medications for this visit.     Past  Surgical History:  Procedure Laterality Date   BACK SURGERY     CARDIOVERSION N/A 10/06/2018   Procedure: CARDIOVERSION;  Surgeon: [name], MD;  Location: [facility];  Service: Cardiovascular;  Laterality: N/A;   CHOLECYSTECTOMY N/A 10/30/2016   Procedure: LAPAROSCOPIC CHOLECYSTECTOMY;  Surgeon: [name], MD;  Location: [facility];  Service: General;  Laterality: N/A;   COLONOSCOPY N/A 07/25/2016   Dr. [name]: 9 mm tubular adenoma. 5 year surveillance    ESOPHAGOGASTRODUODENOSCOPY  2009   EGD completed by Dr. [name] due to hematemesis. Findings of normal esophagus, tiny antral erosions, normal D1, D2   ESOPHAGOGASTRODUODENOSCOPY N/A 05/27/2016   Dr. [name]: normal    EXTERNAL FIXATION LEG Right 02/09/2015   Procedure: EXTERNAL FIXATION LEG;  Surgeon: [name], MD;   Location: [facility];  Service: Orthopedics;  Laterality: Right;   EXTERNAL FIXATION REMOVAL Right 02/14/2015   Procedure: REMOVAL EXTERNAL FIXATION LEG;  Surgeon: [name], MD;  Location: [facility];  Service: Orthopedics;  Laterality: Right;   FEMUR IM NAIL Right 02/09/2015   Procedure: INTRAMEDULLARY (IM) RETROGRADE FEMORAL NAILING;  Surgeon: [name], MD;  Location: [facility];  Service: Orthopedics;  Laterality: Right;   NOSE SURGERY     ORIF ANKLE FRACTURE Right 02/14/2015   Procedure: OPEN REDUCTION INTERNAL FIXATION (ORIF) PILON FRACTURE;  Surgeon: [name], MD;  Location: [facility];  Service: Orthopedics;  Laterality: Right;   ORIF ULNAR FRACTURE Right 02/09/2015   Procedure: OPEN REDUCTION INTERNAL FIXATION (ORIF) ULNAR FRACTURE;  Surgeon: [name], MD;  Location: [facility];  Service: Orthopedics;  Laterality: Right;   POLYPECTOMY  07/25/2016   Procedure: POLYPECTOMY;  Surgeon: [name], MD;  [facility];  Service: Endoscopy;;  colon     Allergies  Allergen Reactions   Bee Venom Anaphylaxis    HONEY BEES   Lisinopril Cough      Family History  Problem Relation Age of Onset   Diabetes Mother    COPD Mother    CAD Father        CABG in his 34's   Diabetes Sister    Atrial fibrillation Brother    Colon cancer Neg Hx      Social History Mr. Boquet reports that he has never smoked. He has never used smokeless tobacco. Mr. Mclear reports no history of alcohol use.   Review of Systems CONSTITUTIONAL: No weight loss, fever, chills, weakness or fatigue.  HEENT: Eyes: No visual loss, blurred vision, double vision or yellow sclerae.No hearing loss, sneezing, congestion, runny nose or sore throat.  SKIN: No rash or itching.  CARDIOVASCULAR: per hpi RESPIRATORY: No shortness of breath, cough or sputum.  GASTROINTESTINAL: No anorexia, nausea, vomiting or diarrhea. No abdominal pain or blood.  GENITOURINARY: No burning on urination, no  polyuria NEUROLOGICAL: No headache, dizziness, syncope, paralysis, ataxia, numbness or tingling in the extremities. No change in bowel or bladder control.  MUSCULOSKELETAL: No muscle, back pain, joint pain or stiffness.  LYMPHATICS: No enlarged nodes. No history of splenectomy.  PSYCHIATRIC: No history of depression or anxiety.  ENDOCRINOLOGIC: No reports of sweating, cold or heat intolerance. No polyuria or polydipsia.  [name]   Physical Examination Today's Vitals   03/20/22 1335  BP: 138/70  Pulse: 66  SpO2: 97%  Weight: 241 lb (109.3 kg)  Height: '5\' 11"'$  (1.803 m)   Body mass index is 33.61 kg/m.  Gen: resting comfortably, no acute distress HEENT: no scleral icterus, pupils equal  round and reactive, no palptable cervical adenopathy,  CV: RRR, no mrg, no jvd Resp: Clear to auscultation bilaterally GI: abdomen is soft, non-tender, non-distended, normal bowel sounds, no hepatosplenomegaly MSK: extremities are warm, no edema.  Skin: warm, no rash Neuro:  no focal deficits Psych: appropriate affect   Diagnostic Studies  Echocardiogram: 08/2018   Study Conclusions - Left ventricle: The cavity size was normal. Wall thickness was   normal. Systolic function was normal. The estimated ejection   fraction was in the range of 60% to 65%. Wall motion was normal;   there were no regional wall motion abnormalities. The study is   not technically sufficient to allow evaluation of LV diastolic   function. - Aortic valve: Valve area (VTI): 2.83 cm^2. Valve area (Vmax):   2.61 cm^2. - Left atrium: The atrium was moderately to severely dilated. - Right atrium: The atrium was mildly dilated. - Atrial septum: No defect or patent foramen ovale was identified.     05/2020 nuclear stress There was no ST segment deviation noted during stress. The study is normal. There are no perfusion defects This is a low risk study. The left ventricular ejection fraction is normal (55-65%).      Assessment and Plan  Aflutter/acquired thrombophilia - s/p DCCV 09/2018, we actually have not seen recurrent aflutter since that time - no symptoms, EKG today shows SR - continue current meds incluiding eliquis for stroke prvention   2. HTN - essentially at goal, continue current meds         Arnoldo Lenis, M.D.

## 2022-03-27 ENCOUNTER — Telehealth: Payer: Self-pay | Admitting: Cardiology

## 2022-03-27 NOTE — Telephone Encounter (Signed)
   Pre-operative Risk Assessment    Patient Name: Jeremy Burgess  DOB: 1965/09/19 MRN: [id number]      Request for Surgical Clearance    Procedure:  Dental extraction 2   Date of Surgery:  Clearance TBD                                 Surgeon:  Annamaria Helling  Surgeon's Group or Practice Name:  [facility]  Phone number:  [phone] Fax number:  [phone]   Type of Clearance Requested:   - Pharmacy:  Hold Apixaban (Eliquis) ?   Type of Anesthesia:  Local    Additional requests/questions:    Sandrea Hammond   03/27/2022, 4:44 PM

## 2022-03-27 NOTE — Telephone Encounter (Signed)
   Patient Name: Jeremy Burgess  DOB: [birth year] MRN: [id number]  Primary Cardiologist: Carlyle Dolly, MD  Chart reviewed as part of pre-operative protocol coverage.   Simple dental extractions (i.e. 1-2 teeth) are considered low risk procedures per guidelines and generally do not require any specific cardiac clearance. It is also generally accepted that for simple extractions and dental cleanings, there is no need to interrupt blood thinner therapy.  SBE prophylaxis is not required for the patient from a cardiac standpoint.  I will route this recommendation to the requesting party via Epic fax function and remove from pre-op pool.  Please call with questions.  Lenna Sciara, NP 03/27/2022, 5:14 PM

## 2022-04-02 ENCOUNTER — Other Ambulatory Visit: Payer: Self-pay | Admitting: Internal Medicine

## 2022-04-02 DIAGNOSIS — E1165 Type 2 diabetes mellitus with hyperglycemia: Secondary | ICD-10-CM

## 2022-05-08 ENCOUNTER — Telehealth: Payer: Self-pay

## 2022-05-08 ENCOUNTER — Other Ambulatory Visit: Payer: Self-pay | Admitting: *Deleted

## 2022-05-08 DIAGNOSIS — E1165 Type 2 diabetes mellitus with hyperglycemia: Secondary | ICD-10-CM

## 2022-05-08 MED ORDER — INSULIN DETEMIR 100 UNIT/ML ~~LOC~~ SOLN: SUBCUTANEOUS | 0 refills | Status: DC

## 2022-05-08 NOTE — Telephone Encounter (Signed)
Med refill  LEVEMIR 100 UNIT/ML injection   Pharmacy:  [facility]

## 2022-05-08 NOTE — Telephone Encounter (Signed)
Medication sent to pharmacy  

## 2022-05-22 ENCOUNTER — Emergency Department (HOSPITAL_COMMUNITY)
Admission: EM | Admit: 2022-05-22 | Discharge: 2022-05-22 | Disposition: A | Discharge status: Home / Self Care | Payer: Medicaid Other | Attending: Emergency Medicine | Admitting: Emergency Medicine

## 2022-05-22 ENCOUNTER — Emergency Department (HOSPITAL_COMMUNITY): Payer: Medicaid Other

## 2022-05-22 ENCOUNTER — Encounter (HOSPITAL_COMMUNITY): Payer: Self-pay | Admitting: Emergency Medicine

## 2022-05-22 ENCOUNTER — Other Ambulatory Visit: Payer: Self-pay

## 2022-05-22 DIAGNOSIS — I480 Paroxysmal atrial fibrillation: Secondary | ICD-10-CM | POA: Diagnosis not present

## 2022-05-22 DIAGNOSIS — E119 Type 2 diabetes mellitus without complications: Secondary | ICD-10-CM | POA: Diagnosis not present

## 2022-05-22 DIAGNOSIS — S61011A Laceration without foreign body of right thumb without damage to nail, initial encounter: Secondary | ICD-10-CM | POA: Diagnosis not present

## 2022-05-22 DIAGNOSIS — S62522A Displaced fracture of distal phalanx of left thumb, initial encounter for closed fracture: Secondary | ICD-10-CM | POA: Diagnosis not present

## 2022-05-22 DIAGNOSIS — J45909 Unspecified asthma, uncomplicated: Secondary | ICD-10-CM | POA: Insufficient documentation

## 2022-05-22 DIAGNOSIS — Z79899 Other long term (current) drug therapy: Secondary | ICD-10-CM | POA: Insufficient documentation

## 2022-05-22 DIAGNOSIS — S6992XA Unspecified injury of left wrist, hand and finger(s), initial encounter: Secondary | ICD-10-CM | POA: Insufficient documentation

## 2022-05-22 DIAGNOSIS — Z794 Long term (current) use of insulin: Secondary | ICD-10-CM | POA: Insufficient documentation

## 2022-05-22 DIAGNOSIS — Z23 Encounter for immunization: Secondary | ICD-10-CM | POA: Insufficient documentation

## 2022-05-22 DIAGNOSIS — Z7901 Long term (current) use of anticoagulants: Secondary | ICD-10-CM | POA: Insufficient documentation

## 2022-05-22 DIAGNOSIS — S6710XA Crushing injury of unspecified finger(s), initial encounter: Secondary | ICD-10-CM

## 2022-05-22 DIAGNOSIS — W230XXA Caught, crushed, jammed, or pinched between moving objects, initial encounter: Secondary | ICD-10-CM | POA: Diagnosis not present

## 2022-05-22 DIAGNOSIS — I10 Essential (primary) hypertension: Secondary | ICD-10-CM | POA: Insufficient documentation

## 2022-05-22 MED ORDER — STERILE WATER FOR INJECTION IJ SOLN
INTRAMUSCULAR | Status: AC
  Administered 2022-05-22: 10 mL
  Filled 2022-05-22: qty 10

## 2022-05-22 MED ORDER — OXYCODONE-ACETAMINOPHEN 5-325 MG PO TABS
2.0000 | ORAL_TABLET | Freq: Once | ORAL | Status: AC
  Administered 2022-05-22: 2 via ORAL
  Filled 2022-05-22: qty 2

## 2022-05-22 MED ORDER — BUPIVACAINE HCL (PF) 0.5 % IJ SOLN
10.0000 mL | Freq: Once | INTRAMUSCULAR | Status: AC
  Administered 2022-05-22: 10 mL
  Filled 2022-05-22: qty 30

## 2022-05-22 MED ORDER — CEPHALEXIN 500 MG PO CAPS: 500.0000 mg | ORAL_CAPSULE | Freq: Four times a day (QID) | ORAL | 0 refills | Status: DC

## 2022-05-22 MED ORDER — HYDROCODONE-ACETAMINOPHEN 5-325 MG PO TABS: 1.0000 | ORAL_TABLET | ORAL | 0 refills | Status: AC | PRN

## 2022-05-22 MED ORDER — TETANUS-DIPHTH-ACELL PERTUSSIS 5-2.5-18.5 LF-MCG/0.5 IM SUSY
0.5000 mL | PREFILLED_SYRINGE | Freq: Once | INTRAMUSCULAR | Status: AC
  Administered 2022-05-22: 0.5 mL via INTRAMUSCULAR
  Filled 2022-05-22: qty 0.5

## 2022-05-22 MED ORDER — CEFAZOLIN SODIUM 1 G IJ SOLR
2.0000 g | Freq: Once | INTRAMUSCULAR | Status: AC
  Administered 2022-05-22: 2 g via INTRAMUSCULAR
  Filled 2022-05-22: qty 20

## 2022-05-22 NOTE — ED Provider Notes (Signed)
[facility] EMERGENCY DEPARTMENT Provider Note   CSN: [id number] Arrival date & time: 05/22/22  1510     History  Chief Complaint  Patient presents with   Finger Injury    Jeremy Burgess is a 56 y.o. male.  HPI Patient presents for finger injury.  Medical history includes paroxysmal atrial fibrillation, T2DM, HTN, HLD, asthma, arthritis, chronic pain.  He is on Eliquis.  Shortly prior to arrival, patient had a left thumb injury.  This occurred when he was unloading wood from a trailer.  While moving a piece of wood, a large, estimated 50 pound, piece of wood rolled and pinched his thumb between the wood and the trailer.  He did not wash out his wound.  He did rapid and electrical tape to help with hemostasis.  He denies any other areas of injury.    Home Medications Prior to Admission medications   Medication Sig Start Date End Date Taking? Authorizing Provider  cephALEXin (KEFLEX) 500 MG capsule Take 1 capsule (500 mg total) by mouth 4 (four) times daily. 05/22/22  Yes Godfrey Pick, MD  HYDROcodone-acetaminophen (NORCO/VICODIN) 5-325 MG tablet Take 1 tablet by mouth every 4 (four) hours as needed for up to 3 days. 05/22/22 05/25/22 Yes Godfrey Pick, MD  amLODipine (NORVASC) 10 MG tablet Take 1 tablet (10 mg total) by mouth daily. 02/13/22   Lindell Spar, MD  apixaban (ELIQUIS) 5 MG TABS tablet Take 1 tablet (5 mg total) by mouth 2 (two) times daily. 09/07/21   Arnoldo Lenis, MD  atorvastatin (LIPITOR) 10 MG tablet Take 1 tablet (10 mg total) by mouth daily. 02/13/22   Lindell Spar, MD  Cholecalciferol (VITAMIN D-3) 125 MCG (5000 UT) TABS Take 15,000 mg by mouth once a week.    [provider]  glucose blood (ACCU-CHEK GUIDE) test strip USE TO TEST TWICE DAILY. 06/14/21   Lindell Spar, MD  insulin detemir (LEVEMIR) 100 UNIT/ML injection INJECT 25 UNITS SUBCUTANEOUSLY DAILY 05/08/22   Lindell Spar, MD  Insulin Syringe-Needle U-100 (SURE COMFORT INSULIN SYRINGE) 31G X  5/16" 1 ML MISC USE AS DIRECTED. 10/12/21   Patel, Colin Broach, MD  JANUMET XR 50-1000 MG TB24 Take 2 tablets by mouth daily. 02/13/22   Lindell Spar, MD  losartan (COZAAR) 100 MG tablet Take 1 tablet (100 mg total) by mouth daily. 02/13/22   Lindell Spar, MD      Allergies    Bee venom and Lisinopril    Review of Systems   Review of Systems  Skin:  Positive for wound.  All other systems reviewed and are negative.   Physical Exam Updated Vital Signs BP 132/70 (BP Location: Right Arm)   Pulse 71   Temp 98.6 F (37 C) (Oral)   Resp 20   Ht '5\' 11"'$  (1.803 m)   Wt 108.9 kg   SpO2 96%   BMI 33.47 kg/m  Physical Exam Vitals and nursing note reviewed.  Constitutional:      General: He is not in acute distress.    Appearance: Normal appearance. He is well-developed and normal weight. He is not ill-appearing, toxic-appearing or diaphoretic.  HENT:     Head: Normocephalic and atraumatic.     Right Ear: External ear normal.     Left Ear: External ear normal.     Nose: Nose normal.     Mouth/Throat:     Mouth: Mucous membranes are moist.     Pharynx: Oropharynx is clear.  Eyes:     Extraocular Movements: Extraocular movements intact.     Conjunctiva/sclera: Conjunctivae normal.  Cardiovascular:     Rate and Rhythm: Normal rate and regular rhythm.  Pulmonary:     Effort: Pulmonary effort is normal. No respiratory distress.  Abdominal:     General: There is no distension.     Palpations: Abdomen is soft.     Tenderness: There is no abdominal tenderness.  Musculoskeletal:        General: Signs of injury present. No swelling. Normal range of motion.     Cervical back: Normal range of motion and neck supple.  Skin:    General: Skin is warm and dry.     Capillary Refill: Capillary refill takes less than 2 seconds.     Coloration: Skin is not jaundiced or pale.  Neurological:     General: No focal deficit present.     Mental Status: He is alert and oriented to person, place, and  time.     Cranial Nerves: No cranial nerve deficit.     Sensory: No sensory deficit.     Motor: No weakness.     Coordination: Coordination normal.  Psychiatric:        Mood and Affect: Mood normal.        Behavior: Behavior normal.        Thought Content: Thought content normal.        Judgment: Judgment normal.        ED Results / Procedures / Treatments   Labs (all labs ordered are listed, but only abnormal results are displayed) Labs Reviewed - No data to display  EKG None  Radiology DG Finger Thumb Left  Result Date: 05/22/2022 CLINICAL DATA:  Laceration EXAM: LEFT THUMB 2+V COMPARISON:  None Available. FINDINGS: Acute mildly comminuted and displaced fracture involving tuft of the distal phalanx with overlying soft tissue injury. Punctate bone fragment or small foreign body at the site of laceration. IMPRESSION: 1. Acute mildly comminuted tuft fracture first distal phalanx with overlying soft tissue injury. 2. Punctate bone fragment or possible small foreign body in the region of the laceration Electronically Signed   By: Donavan Foil M.D.   On: 05/22/2022 15:53    Procedures .Marland KitchenLaceration Repair  Date/Time: 05/22/2022 5:30 PM  Performed by: Godfrey Pick, MD Authorized by: Godfrey Pick, MD   Consent:    Consent obtained:  Verbal   Consent given by:  Patient   Risks, benefits, and alternatives were discussed: yes     Risks discussed:  Need for additional repair, infection, poor cosmetic result and pain   Alternatives discussed:  No treatment and delayed treatment Universal protocol:    Procedure explained and questions answered to patient or proxy's satisfaction: yes     Imaging studies available: yes     Patient identity confirmed:  Verbally with patient Anesthesia:    Anesthesia method:  Nerve block   Block location:  Left first digit   Block needle gauge:  25 G   Block anesthetic:  Bupivacaine 0.5% w/o epi   Block technique:  Digital block   Block injection  procedure:  Anatomic landmarks identified, negative aspiration for blood and incremental injection   Block outcome:  Anesthesia achieved Laceration details:    Location:  Finger   Finger location:  L thumb   Length (cm):  5   Depth (mm):  10 Pre-procedure details:    Preparation:  Patient was prepped and draped in usual sterile fashion and  imaging obtained to evaluate for foreign bodies Treatment:    Area cleansed with:  Saline   Amount of cleaning:  Extensive   Irrigation solution:  Sterile saline   Irrigation volume:  1L   Irrigation method:  Syringe   Visualized foreign bodies/material removed: no   Skin repair:    Repair method:  Sutures   Suture size:  5-0   Suture material:  Nylon   Suture technique:  Simple interrupted   Number of sutures:  2 Approximation:    Approximation:  Loose Repair type:    Repair type:  Simple Post-procedure details:    Dressing: Xeroform, gauze, splint, and Coban.   Procedure completion:  Tolerated well, no immediate complications     Medications Ordered in ED Medications  oxyCODONE-acetaminophen (PERCOCET/ROXICET) 5-325 MG per tablet 2 tablet (2 tablets Oral Given 05/22/22 1603)  Tdap (BOOSTRIX) injection 0.5 mL (0.5 mLs Intramuscular Given 05/22/22 1604)  ceFAZolin (ANCEF) injection 2 g (2 g Intramuscular Given 05/22/22 1604)  bupivacaine(PF) (MARCAINE) 0.5 % injection 10 mL (10 mLs Infiltration Given by Other 05/22/22 1642)  sterile water (preservative free) injection (10 mLs  Given 05/22/22 1604)    ED Course/ Medical Decision Making/ A&P                           Medical Decision Making Amount and/or Complexity of Data Reviewed Radiology: ordered.  Risk Prescription drug management.   Patient presents for left thumb injury.  This occurred shortly prior to arrival.  Patient describes a crush type mechanism while unloading wood.  A large, estimated 50 pound, he is a wood fell on his thumb, pinning it to the metal trailer.  He  denies any other injuries.  On arrival in the ED, patient is overall well-appearing.  On inspection of his thumb, he does appear to have exposed bone, avulsed nail, and a large laceration.  Wound is oozing blood at this time.  Patient was instructed to soak his thumb and sterile saline.  Percocet was ordered for analgesia.  X-ray imaging did show distal phalanx fracture.  Ancef was given for open fracture.  Tetanus was updated.  Patient was consented for digital nerve block.  This was done with Marcaine with good effect after first round of injections.  Following this, he underwent further irrigation of his wound.  Patient's avulsed nail was replaced.  It was sewn in place.  Loose stitching was applied to large laceration.  Xeroform and gauze dressing was placed.  Patient's finger was splinted with AlumaFoam and Coban wrap.  Patient was informed that he will need to see a hand surgeon for likely revision of what is effectively a distal amputation.  He was provided contact information and advised to call tomorrow morning to set up an appointment for soon as possible.  He expressed understanding.  He was prescribed Ancef and narcotic pain medication.  He was discharged in good condition.        Final Clinical Impression(s) / ED Diagnoses Final diagnoses:  Crushing injury of finger, initial encounter    Rx / DC Orders ED Discharge Orders          Ordered    cephALEXin (KEFLEX) 500 MG capsule  4 times daily        05/22/22 1807    HYDROcodone-acetaminophen (NORCO/VICODIN) 5-325 MG tablet  Every 4 hours PRN        05/22/22 1807  Godfrey Pick, MD 05/23/22 [phone]

## 2022-05-22 NOTE — ED Notes (Signed)
Pt has wound covered with electrical tape. Tape left in place until MD can assess.  Pt given basin of sterile water to soak hand in. LAC Cart and betadine placed at bedside.

## 2022-05-22 NOTE — ED Triage Notes (Signed)
Pt states getting wood off trailer, smashed left thumb, tip of finger amputated, nail hanging from skin. Pt states on eliquis.

## 2022-05-22 NOTE — ED Notes (Signed)
ED Provider at bedside. 

## 2022-05-22 NOTE — Discharge Instructions (Addendum)
Prescriptions for antibiotics and narcotic pain medication were sent to your pharmacy.  Take antibiotics as prescribed.  Take narcotic pain medication only as needed.    You will need to see a Copy.  There is a telephone number below that you can call to set up that appointment.  Call in the morning to set up an appointment for soon as possible.

## 2022-05-23 ENCOUNTER — Telehealth: Payer: Self-pay | Admitting: Orthopedic Surgery

## 2022-05-23 NOTE — Telephone Encounter (Signed)
Called patient this morning. Done; patient aware of appointment tomorrow morning.

## 2022-05-23 NOTE — Telephone Encounter (Signed)
Patient came to office in person this morning, 05/23/22, following [facility] emergency room visit yesterday for problem: left thumb crush injury;  per chart note, he is on Eliquis. Patient relays the thumb is throbbing. Aware Dr Amedeo Kinsman is in surgery today and in office tomorrow morning.  Please review and advise due to acuity of injury. Patient ph# Phone: [phone]

## 2022-05-24 ENCOUNTER — Ambulatory Visit (INDEPENDENT_AMBULATORY_CARE_PROVIDER_SITE_OTHER): Payer: Medicaid Other | Admitting: Orthopedic Surgery

## 2022-05-24 ENCOUNTER — Encounter: Payer: Self-pay | Admitting: Orthopedic Surgery

## 2022-05-24 VITALS — BP 152/72 | HR 68 | Ht 71.0 in | Wt 240.0 lb

## 2022-05-24 DIAGNOSIS — S62639B Displaced fracture of distal phalanx of unspecified finger, initial encounter for open fracture: Secondary | ICD-10-CM

## 2022-05-24 DIAGNOSIS — S6710XA Crushing injury of unspecified finger(s), initial encounter: Secondary | ICD-10-CM

## 2022-05-24 DIAGNOSIS — S6702XA Crushing injury of left thumb, initial encounter: Secondary | ICD-10-CM | POA: Diagnosis not present

## 2022-05-24 NOTE — Patient Instructions (Signed)
Elevate your hand to help with swelling  Finish the antibiotics provided  Keep hand clean and dry.  OK to change your dressings  Follow up in 10 days for suture removal

## 2022-05-25 ENCOUNTER — Encounter: Payer: Self-pay | Admitting: Orthopedic Surgery

## 2022-05-25 NOTE — Progress Notes (Signed)
New Patient Visit  Assessment: Jeremy Burgess is a 56 y.o. male with the following: 1. Crushing injury of finger, initial encounter 2. Open fracture of tuft of distal phalanx of finger  Plan: Jeremy Burgess crushed his left thumb.  Some comminution of the distal phalanx.  Nail replaced.  Laceration looks clean and dry.  Complete antibiotics.  Keep wound clean.  Alumafoam splint.  Meds as needed.  Follow up in 10 days.  Follow-up: Return in about 10 days (around 06/03/2022).  Subjective:  Chief Complaint  Patient presents with   New Patient (Initial Visit)   crush injury finger    LT thumb DOI 05/22/22    History of Present Illness: Jeremy Burgess is a 56 y.o. male who presents for evaluation of left thumb pain.  His left thumb was crushed by a log a couple of days ago.  Immediate pain and bleeding.  Presented to ED.  Wound cleaned and sutured.  He has been in a splint.  Keflex x 5 days.  No other injuries.    Review of Systems: No fevers or chills No numbness or tingling No chest pain No shortness of breath No bowel or bladder dysfunction No GI distress No headaches   Medical History:  Past Medical History:  Diagnosis Date   Accidentally struck by falling tree 02/08/2015   Acute blood loss anemia 02/09/2015   Acute respiratory failure ([place]) 02/09/2015   Acute respiratory failure ([place]) 02/09/2015   Ankle fracture, right 02/09/2015   Arthritis    Asthma    Atrial flutter ([place])    a. diagnosed in 08/2018 b. s/p DCCV in 09/2018 with return to NSR   Chronic pain due to trauma    Closed fracture of right ulna 02/08/2015   Closed right pilon fracture 02/23/2015   Diabetes mellitus    Diabetes mellitus ([place])    Femur fracture, right ([place]) 02/08/2015   Heart murmur    History of blood clots    DVT   Hypertension    Injury of hand, extensor tendon, right, sequela 09/01/2018   Pneumothorax    Postlaminectomy syndrome, lumbar region 11/13/2015   Rib fracture     Thrombocytopenia ([place]) 02/09/2015   Traumatic closed displaced fracture of multiple ribs of right side 02/08/2015   Traumatic hemopneumothorax 02/09/2015   Upper GI bleed 05/26/2016   Upper GI bleeding 05/27/2016   Trivial. EGD unremarkable.    Past Surgical History:  Procedure Laterality Date   BACK SURGERY     CARDIOVERSION N/A 10/06/2018   Procedure: CARDIOVERSION;  Surgeon: [name], MD;  Location: [facility];  Service: Cardiovascular;  Laterality: N/A;   CHOLECYSTECTOMY N/A 10/30/2016   Procedure: LAPAROSCOPIC CHOLECYSTECTOMY;  Surgeon: [name], MD;  Location: [facility];  Service: General;  Laterality: N/A;   COLONOSCOPY N/A 07/25/2016   Dr. [name]: 9 mm tubular adenoma. 5 year surveillance    ESOPHAGOGASTRODUODENOSCOPY  2009   EGD completed by Dr. [name] due to hematemesis. Findings of normal esophagus, tiny antral erosions, normal D1, D2   ESOPHAGOGASTRODUODENOSCOPY N/A 05/27/2016   Dr. [name]: normal    EXTERNAL FIXATION LEG Right 02/09/2015   Procedure: EXTERNAL FIXATION LEG;  Surgeon: [name], MD;  Location: [place];  Service: Orthopedics;  Laterality: Right;   EXTERNAL FIXATION REMOVAL Right 02/14/2015   Procedure: REMOVAL EXTERNAL FIXATION LEG;  Surgeon: [name], MD;  Location: [place];  Service: Orthopedics;  Laterality: Right;   FEMUR IM NAIL Right 02/09/2015  Procedure: INTRAMEDULLARY (IM) RETROGRADE FEMORAL NAILING;  Surgeon: [name], MD;  Location: [place];  Service: Orthopedics;  Laterality: Right;   NOSE SURGERY     ORIF ANKLE FRACTURE Right 02/14/2015   Procedure: OPEN REDUCTION INTERNAL FIXATION (ORIF) PILON FRACTURE;  Surgeon: [name], MD;  Location: [place];  Service: Orthopedics;  Laterality: Right;   ORIF ULNAR FRACTURE Right 02/09/2015   Procedure: OPEN REDUCTION INTERNAL FIXATION (ORIF) ULNAR FRACTURE;  Surgeon: [name], MD;  Location: [place];  Service: Orthopedics;  Laterality: Right;   POLYPECTOMY  07/25/2016   Procedure:  POLYPECTOMY;  Surgeon: [name], MD;  [facility];  Service: Endoscopy;;  colon    Family History  Problem Relation Age of Onset   Diabetes Mother    COPD Mother    CAD Father        CABG in his 28's   Diabetes Sister    Atrial fibrillation Brother    Colon cancer Neg Hx    Social History   Tobacco Use   Smoking status: Never   Smokeless tobacco: Never  Vaping Use   Vaping Use: Never used  Substance Use Topics   Alcohol use: No    Comment: history of alcohol abuse but quit in 2008    Drug use: No    Allergies  Allergen Reactions   Bee Venom Anaphylaxis    HONEY BEES   Lisinopril Cough    Current Meds  Medication Sig   amLODipine (NORVASC) 10 MG tablet Take 1 tablet (10 mg total) by mouth daily.   apixaban (ELIQUIS) 5 MG TABS tablet Take 1 tablet (5 mg total) by mouth 2 (two) times daily.   atorvastatin (LIPITOR) 10 MG tablet Take 1 tablet (10 mg total) by mouth daily.   cephALEXin (KEFLEX) 500 MG capsule Take 1 capsule (500 mg total) by mouth 4 (four) times daily.   Cholecalciferol (VITAMIN D-3) 125 MCG (5000 UT) TABS Take 15,000 mg by mouth once a week.   glucose blood (ACCU-CHEK GUIDE) test strip USE TO TEST TWICE DAILY.   HYDROcodone-acetaminophen (NORCO/VICODIN) 5-325 MG tablet Take 1 tablet by mouth every 4 (four) hours as needed for up to 3 days.   insulin detemir (LEVEMIR) 100 UNIT/ML injection INJECT 25 UNITS SUBCUTANEOUSLY DAILY   Insulin Syringe-Needle U-100 (SURE COMFORT INSULIN SYRINGE) 31G X 5/16" 1 ML MISC USE AS DIRECTED.   JANUMET XR 50-1000 MG TB24 Take 2 tablets by mouth daily.   losartan (COZAAR) 100 MG tablet Take 1 tablet (100 mg total) by mouth daily.    Objective: BP (!) 152/72   Pulse 68   Ht '5\' 11"'$  (1.803 m)   Wt 240 lb (108.9 kg)   BMI 33.47 kg/m   Physical Exam:  General: Alert and oriented. and No acute distress. Gait: Normal gait.  Left thumb laceration is clean, dry and intact.  Sutures in place.  Some  swelling.  Sensation intact to distal tip.  Brisk capillary refill.        IMAGING: I personally reviewed images previously obtained from the ED  Comminuted distal phalanx fracture, left thumb   New Medications:  No orders of the defined types were placed in this encounter.     Mordecai Rasmussen, MD  05/25/2022 8:04 PM

## 2022-06-03 ENCOUNTER — Ambulatory Visit (INDEPENDENT_AMBULATORY_CARE_PROVIDER_SITE_OTHER): Payer: Medicaid Other

## 2022-06-03 ENCOUNTER — Ambulatory Visit (INDEPENDENT_AMBULATORY_CARE_PROVIDER_SITE_OTHER): Payer: Medicaid Other | Admitting: Orthopedic Surgery

## 2022-06-03 ENCOUNTER — Encounter: Payer: Self-pay | Admitting: Orthopedic Surgery

## 2022-06-03 DIAGNOSIS — S62522B Displaced fracture of distal phalanx of left thumb, initial encounter for open fracture: Secondary | ICD-10-CM | POA: Diagnosis not present

## 2022-06-03 DIAGNOSIS — S62639B Displaced fracture of distal phalanx of unspecified finger, initial encounter for open fracture: Secondary | ICD-10-CM

## 2022-06-03 NOTE — Patient Instructions (Signed)
Ok to work on range of motion  No lifting with the left hand

## 2022-06-03 NOTE — Progress Notes (Signed)
Return patient Visit  Assessment: Jeremy Burgess is a 56 y.o. male with the following: 1. Crushing injury of finger 2. Open fracture of tuft of distal phalanx of finger  Plan: Jeremy Burgess crushed his left thumb.  Repeat radiographs look good.  Sutures were removed.  Intact sensation to the radial and ulnar aspect of the thumb.  Skin to the distal aspect of the thumb is firm, but not painful.  No drainage.  He is healing this well.  Continue with dressing as needed.  Okay to start working on gentle range of motion of the hand, thumb and wrist.  I think this will improve some of the pain that he is having in the distal forearm.  He states understanding.  Follow-up in 2 weeks.  Follow-up: Return in about 2 weeks (around 06/17/2022).  Subjective:  Chief Complaint  Patient presents with   Hand Injury    Left thumb DOI 05/22/22    History of Present Illness: Jeremy Burgess is a 56 y.o. male who returns for evaluation of left thumb pain.  His left thumb was crushed by a log a couple of weeks ago.  His pain has improved.  He was wearing a dressing, with an AlumaFoam splint.  He has removed it, and has been dressing the thumb.  No fevers or chills.  He has completed his Keflex.  He states the sensation to the left thumb has improved.  Review of Systems: No fevers or chills No numbness or tingling No chest pain No shortness of breath No bowel or bladder dysfunction No GI distress No headaches     Objective: There were no vitals taken for this visit.  Physical Exam:  General: Alert and oriented. and No acute distress. Gait: Normal gait.  Left thumb laceration is clean, dry and intact.  Minimal swelling.  Sensation intact to distal tip.  Distal skin is firm, and appears to be scabbing.  Slightly restricted range of motion at the first MCP, as well as the IP joint.       IMAGING: I personally reviewed images previously obtained from the ED  X-rays of the left thumb were  obtained in clinic today.  These are compared to prior x-rays.  Prior comminution is not appreciated.  IP joint is located.  No additional injuries are noted.  Distal phalanx is in good alignment.  Impression: Healing left distal phalanx fracture   New Medications:  No orders of the defined types were placed in this encounter.     Mordecai Rasmussen, MD  06/03/2022 12:06 PM

## 2022-06-18 ENCOUNTER — Ambulatory Visit (INDEPENDENT_AMBULATORY_CARE_PROVIDER_SITE_OTHER): Payer: Medicaid Other

## 2022-06-18 ENCOUNTER — Ambulatory Visit (INDEPENDENT_AMBULATORY_CARE_PROVIDER_SITE_OTHER): Payer: Medicaid Other | Admitting: Orthopedic Surgery

## 2022-06-18 ENCOUNTER — Encounter: Payer: Self-pay | Admitting: Internal Medicine

## 2022-06-18 ENCOUNTER — Encounter: Payer: Self-pay | Admitting: Orthopedic Surgery

## 2022-06-18 ENCOUNTER — Ambulatory Visit: Payer: Medicaid Other | Admitting: Internal Medicine

## 2022-06-18 VITALS — BP 138/74 | HR 79 | Ht 71.0 in | Wt 241.2 lb

## 2022-06-18 DIAGNOSIS — I483 Typical atrial flutter: Secondary | ICD-10-CM | POA: Diagnosis not present

## 2022-06-18 DIAGNOSIS — S62522D Displaced fracture of distal phalanx of left thumb, subsequent encounter for fracture with routine healing: Secondary | ICD-10-CM | POA: Diagnosis not present

## 2022-06-18 DIAGNOSIS — Z7901 Long term (current) use of anticoagulants: Secondary | ICD-10-CM | POA: Diagnosis not present

## 2022-06-18 DIAGNOSIS — S6702XD Crushing injury of left thumb, subsequent encounter: Secondary | ICD-10-CM

## 2022-06-18 DIAGNOSIS — Z23 Encounter for immunization: Secondary | ICD-10-CM

## 2022-06-18 DIAGNOSIS — E1169 Type 2 diabetes mellitus with other specified complication: Secondary | ICD-10-CM

## 2022-06-18 DIAGNOSIS — S62639B Displaced fracture of distal phalanx of unspecified finger, initial encounter for open fracture: Secondary | ICD-10-CM

## 2022-06-18 DIAGNOSIS — E1165 Type 2 diabetes mellitus with hyperglycemia: Secondary | ICD-10-CM

## 2022-06-18 DIAGNOSIS — I10 Essential (primary) hypertension: Secondary | ICD-10-CM | POA: Diagnosis not present

## 2022-06-18 DIAGNOSIS — Z794 Long term (current) use of insulin: Secondary | ICD-10-CM

## 2022-06-18 MED ORDER — INSULIN DETEMIR 100 UNIT/ML ~~LOC~~ SOLN: SUBCUTANEOUS | 3 refills | Status: DC

## 2022-06-18 NOTE — Progress Notes (Signed)
Return patient Visit  Assessment: Jeremy Burgess is a 56 y.o. male with the following: 1. Crushing injury of finger 2. Open fracture of tuft of distal phalanx of finger  Plan: Jeremy Burgess crushed his left thumb.  Repeat radiographs demonstrates excellent alignment.  His laceration and injuries are healing well.  His nail remains in place, but I anticipate that this will fall out with growth of the new nail.  Continue to keep the hand clean.  Work on range of motion of the IP joint.  If he has issues, he will contact the clinic.  Otherwise follow-up as needed.    Follow-up: Return if symptoms worsen or fail to improve.  Subjective:  Chief Complaint  Patient presents with   Left Hand - Fracture    Thumb/ improving     History of Present Illness: Jeremy Burgess is a 56 y.o. male who returns for evaluation of left thumb pain.  He sustained an open fracture of the left distal phalanx to his thumb approximately 1 month ago.  He has completed antibiotics.  No fevers or chills.  He states his thumb is getting better.  He has some sensitivity to the tip, but otherwise has minimal pain.  Review of Systems: No fevers or chills No numbness or tingling No chest pain No shortness of breath No bowel or bladder dysfunction No GI distress No headaches     Objective: There were no vitals taken for this visit.  Physical Exam:  General: Alert and oriented. and No acute distress. Gait: Normal gait.  Left thumb laceration is clean, dry and intact.  Laceration is healing.  There is some scabbing, including over the nailbed.  Nail remains in place, but appears close to falling off.  Restricted range of motion at the IP joint.  Mild tenderness to palpation of the distal tip of the thumb.        IMAGING: I personally reviewed images previously obtained from the ED  X-rays of the left thumb were obtained in clinic today.  These are compared to prior x-rays.  There has been no  interval displacement of the distal phalanx fracture.  Mild fracture fragments are appreciated.  No dislocation.  Impression: Healing left distal phalanx fracture   New Medications:  No orders of the defined types were placed in this encounter.     Mordecai Rasmussen, MD  06/18/2022 9:57 PM

## 2022-06-18 NOTE — Assessment & Plan Note (Addendum)
Lab Results  Component Value Date   HGBA1C 8.1 (H) 02/13/2022   Uncontrolled, but improving On Levemir 25 U qHS and Janumet BID Offered Levemir flexpen, but patient prefers to inject insulin with syringe Advised to follow diabetic diet On ACEi and statin now F/u CMP and lipid panel Diabetic eye exam: Advised to follow up with Ophthalmology for diabetic eye exam

## 2022-06-18 NOTE — Patient Instructions (Signed)
Keep hand clean  Call the clinic if you have issues

## 2022-06-18 NOTE — Patient Instructions (Addendum)
Please continue taking medications as prescribed.  Please continue to follow low carb diet and perform moderate exercise/walking at least 150 mins/week. 

## 2022-06-18 NOTE — Assessment & Plan Note (Signed)
During hospitalization in 2020 Was cardioverted On Eliquis Had bradycardia with Cardizem Followed by Cardiology

## 2022-06-21 ENCOUNTER — Other Ambulatory Visit: Payer: Self-pay

## 2022-06-21 ENCOUNTER — Telehealth: Payer: Self-pay | Admitting: Internal Medicine

## 2022-06-21 DIAGNOSIS — S6702XA Crushing injury of left thumb, initial encounter: Secondary | ICD-10-CM | POA: Insufficient documentation

## 2022-06-21 DIAGNOSIS — I483 Typical atrial flutter: Secondary | ICD-10-CM

## 2022-06-21 DIAGNOSIS — I480 Paroxysmal atrial fibrillation: Secondary | ICD-10-CM

## 2022-06-21 MED ORDER — APIXABAN 5 MG PO TABS: 5.0000 mg | ORAL_TABLET | Freq: Two times a day (BID) | ORAL | 5 refills | Status: DC

## 2022-06-21 NOTE — Telephone Encounter (Signed)
Refills

## 2022-06-21 NOTE — Progress Notes (Signed)
Established Patient Office Visit  Subjective:  Patient ID: Jeremy Burgess, male    DOB: 08-08-1966  Age: 56 y.o. MRN: [id number]  CC:  Chief Complaint  Patient presents with   Follow-up    Follow up recently had crush injury to L thumb    HPI Jeremy Burgess is a 56 y.o. male with past medical history of HTN, atrial flutter, type II DM with neuropathy, chronic low back pain, chronic right ankle pain due to trauma and obesity who presents for f/u of his chronic medical conditions.  He had crushing injury of left thumb, for which he went to ER and has been following up with orthopedic surgeon.  His wound has been slowly healing.  Denies any active bleeding, but has tingling over the thumb area.  Type II DM: His HbA1c was 8.1 in 07/23.  He takes Levemir 25 U qHS and Janumet twice daily.  He prefers to take insulin with syringe, and does not want flexpen for now. He denies any polyuria or polydipsia or polyphagia. He has not brought his glucometer log today.  HTN: BP is well-controlled. Takes medications regularly. Patient denies headache, dizziness, chest pain, dyspnea or palpitations.   He received flu and second dose of Shingrix vaccine today.  Past Medical History:  Diagnosis Date   Accidentally struck by falling tree 02/08/2015   Acute blood loss anemia 02/09/2015   Acute respiratory failure ([place]) 02/09/2015   Acute respiratory failure ([place]) 02/09/2015   Ankle fracture, right 02/09/2015   Arthritis    Asthma    Atrial flutter ([place])    a. diagnosed in 08/2018 b. s/p DCCV in 09/2018 with return to NSR   Chronic pain due to trauma    Closed fracture of right ulna 02/08/2015   Closed right pilon fracture 02/23/2015   Diabetes mellitus    Diabetes mellitus ([place])    Femur fracture, right ([place]) 02/08/2015   Heart murmur    History of blood clots    DVT   Hypertension    Injury of hand, extensor tendon, right, sequela 09/01/2018   Pneumothorax    Postlaminectomy syndrome, lumbar  region 11/13/2015   Rib fracture    Thrombocytopenia ([place]) 02/09/2015   Traumatic closed displaced fracture of multiple ribs of right side 02/08/2015   Traumatic hemopneumothorax 02/09/2015   Upper GI bleed 05/26/2016   Upper GI bleeding 05/27/2016   Trivial. EGD unremarkable.    Past Surgical History:  Procedure Laterality Date   BACK SURGERY     CARDIOVERSION N/A 10/06/2018   Procedure: CARDIOVERSION;  Surgeon: [name], MD;  Location: [facility];  Service: Cardiovascular;  Laterality: N/A;   CHOLECYSTECTOMY N/A 10/30/2016   Procedure: LAPAROSCOPIC CHOLECYSTECTOMY;  Surgeon: [name], MD;  Location: [facility];  Service: General;  Laterality: N/A;   COLONOSCOPY N/A 07/25/2016   Dr. [name]: 9 mm tubular adenoma. 5 year surveillance    ESOPHAGOGASTRODUODENOSCOPY  2009   EGD completed by Dr. [name] due to hematemesis. Findings of normal esophagus, tiny antral erosions, normal D1, D2   ESOPHAGOGASTRODUODENOSCOPY N/A 05/27/2016   Dr. [name]: normal    EXTERNAL FIXATION LEG Right 02/09/2015   Procedure: EXTERNAL FIXATION LEG;  Surgeon: [name], MD;  Location: [place];  Service: Orthopedics;  Laterality: Right;   EXTERNAL FIXATION REMOVAL Right 02/14/2015   Procedure: REMOVAL EXTERNAL FIXATION LEG;  Surgeon: [name], MD;  Location: [place];  Service: Orthopedics;  Laterality: Right;   FEMUR IM NAIL Right  02/09/2015   Procedure: INTRAMEDULLARY (IM) RETROGRADE FEMORAL NAILING;  Surgeon: [name], MD;  Location: [place];  Service: Orthopedics;  Laterality: Right;   NOSE SURGERY     ORIF ANKLE FRACTURE Right 02/14/2015   Procedure: OPEN REDUCTION INTERNAL FIXATION (ORIF) PILON FRACTURE;  Surgeon: [name], MD;  Location: [facility];  Service: Orthopedics;  Laterality: Right;   ORIF ULNAR FRACTURE Right 02/09/2015   Procedure: OPEN REDUCTION INTERNAL FIXATION (ORIF) ULNAR FRACTURE;  Surgeon: [name], MD;  Location: [facility];  Service: Orthopedics;  Laterality: Right;    POLYPECTOMY  07/25/2016   Procedure: POLYPECTOMY;  Surgeon: [name], MD;  [facility];  Service: Endoscopy;;  colon    Family History  Problem Relation Age of Onset   Diabetes Mother    COPD Mother    CAD Father        CABG in his 15's   Diabetes Sister    Atrial fibrillation Brother    Colon cancer Neg Hx     Social History   Socioeconomic History   Marital status: Single    Spouse name: Not on file   Number of children: Not on file   Years of education: Not on file   Highest education level: Not on file  Occupational History   Not on file  Tobacco Use   Smoking status: Never   Smokeless tobacco: Never  Vaping Use   Vaping Use: Never used  Substance and Sexual Activity   Alcohol use: No    Comment: history of alcohol abuse but quit in 2008    Drug use: No   Sexual activity: Yes  Other Topics Concern   Not on file  Social History Narrative       Single.Lives alone.On disability secondary to tree fall accident 2016.   Social Determinants of Health   Financial Resource Strain: Medium Risk (06/27/2020)   Overall Financial Resource Strain (CARDIA)    Difficulty of Paying Living Expenses: Somewhat hard  Food Insecurity: Not on file  Transportation Needs: Not on file  Physical Activity: Not on file  Stress: Not on file  Social Connections: Not on file  Intimate Partner Violence: Not on file    Outpatient Medications Prior to Visit  Medication Sig Dispense Refill   amLODipine (NORVASC) 10 MG tablet Take 1 tablet (10 mg total) by mouth daily. 90 tablet 3   atorvastatin (LIPITOR) 10 MG tablet Take 1 tablet (10 mg total) by mouth daily. 90 tablet 3   Cholecalciferol (VITAMIN D-3) 125 MCG (5000 UT) TABS Take 15,000 mg by mouth once a week.     glucose blood (ACCU-CHEK GUIDE) test strip USE TO TEST TWICE DAILY. 200 strip 11   Insulin Syringe-Needle U-100 (SURE COMFORT INSULIN SYRINGE) 31G X 5/16" 1 ML MISC USE AS DIRECTED. 100 each 11   JANUMET XR  50-1000 MG TB24 Take 2 tablets by mouth daily. 180 tablet 3   losartan (COZAAR) 100 MG tablet Take 1 tablet (100 mg total) by mouth daily. 90 tablet 3   apixaban (ELIQUIS) 5 MG TABS tablet Take 1 tablet (5 mg total) by mouth 2 (two) times daily. 60 tablet 5   insulin detemir (LEVEMIR) 100 UNIT/ML injection INJECT 25 UNITS SUBCUTANEOUSLY DAILY 10 mL 0   cephALEXin (KEFLEX) 500 MG capsule Take 1 capsule (500 mg total) by mouth 4 (four) times daily. (Patient not taking: Reported on 06/18/2022) 20 capsule 0   No facility-administered medications prior to visit.  Allergies  Allergen Reactions   Bee Venom Anaphylaxis    HONEY BEES   Lisinopril Cough    ROS Review of Systems  Constitutional:  Negative for chills and fever.  HENT:  Negative for congestion and sore throat.   Eyes:  Negative for pain and discharge.  Respiratory:  Negative for cough and shortness of breath.   Cardiovascular:  Negative for chest pain and palpitations.  Gastrointestinal:  Negative for constipation, diarrhea, nausea and vomiting.  Endocrine: Negative for polydipsia and polyuria.  Genitourinary:  Negative for dysuria and hematuria.  Musculoskeletal:  Positive for arthralgias and back pain. Negative for neck pain and neck stiffness.  Skin:  Positive for wound. Negative for rash.  Neurological:  Positive for numbness. Negative for dizziness, weakness and headaches.  Psychiatric/Behavioral:  Negative for agitation and behavioral problems.       Objective:    Physical Exam Vitals reviewed.  Constitutional:      General: He is not in acute distress.    Appearance: He is obese. He is not diaphoretic.  HENT:     Head: Normocephalic and atraumatic.     Nose: Nose normal.     Mouth/Throat:     Mouth: Mucous membranes are moist.  Eyes:     General: No scleral icterus.    Extraocular Movements: Extraocular movements intact.  Cardiovascular:     Rate and Rhythm: Normal rate and regular rhythm.     Pulses:  Normal pulses.     Heart sounds: Normal heart sounds. No murmur heard. Pulmonary:     Breath sounds: Normal breath sounds. No wheezing or rales.  Abdominal:     Palpations: Abdomen is soft.     Tenderness: There is no abdominal tenderness. There is no right CVA tenderness or left CVA tenderness.  Musculoskeletal:     Cervical back: Neck supple. No tenderness.     Right lower leg: No edema.     Left lower leg: No edema.     Comments: Crush injury of left thumb, no active bleeding or discharge  Skin:    General: Skin is warm.     Findings: No rash.  Neurological:     General: No focal deficit present.     Mental Status: He is alert and oriented to person, place, and time.     Sensory: Sensory deficit present.     Motor: No weakness.  Psychiatric:        Mood and Affect: Mood normal.        Behavior: Behavior normal.     BP 138/74 (BP Location: Right Arm, Cuff Size: Normal)   Pulse 79   Ht _0  (1.803 m)   Wt 241 lb 3.2 oz (109.4 kg)   SpO2 96%   BMI 33.64 kg/m  Wt Readings from Last 3 Encounters:  06/18/22 241 lb 3.2 oz (109.4 kg)  05/24/22 240 lb (108.9 kg)  05/22/22 240 lb (108.9 kg)    Lab Results  Component Value Date   TSH 2.720 02/13/2022   Lab Results  Component Value Date   WBC 7.6 02/13/2022   HGB 12.8 (L) 02/13/2022   HCT 39.2 02/13/2022   MCV 85 02/13/2022   PLT 236 02/13/2022   Lab Results  Component Value Date   NA 139 02/13/2022   K 4.6 02/13/2022   CO2 21 02/13/2022   GLUCOSE 143 (H) 02/13/2022   BUN 21 02/13/2022   CREATININE 1.22 02/13/2022   BILITOT 0.3 02/13/2022   ALKPHOS 70  02/13/2022   AST 17 02/13/2022   ALT 16 02/13/2022   PROT 7.2 02/13/2022   ALBUMIN 4.5 02/13/2022   CALCIUM 10.1 02/13/2022   ANIONGAP 11 02/11/2022   EGFR 69 02/13/2022   Lab Results  Component Value Date   CHOL 143 02/13/2022   Lab Results  Component Value Date   HDL 42 02/13/2022   Lab Results  Component Value Date   LDLCALC 85 02/13/2022    Lab Results  Component Value Date   TRIG 85 02/13/2022   Lab Results  Component Value Date   CHOLHDL 3.4 02/13/2022   Lab Results  Component Value Date   HGBA1C 8.1 (H) 02/13/2022      Assessment & Plan:   Problem List Items Addressed This Visit       Cardiovascular and Mediastinum   Essential hypertension    BP Readings from Last 1 Encounters:  06/18/22 138/74  Usually well-controlled with Losartan Counseled for compliance with the medications Advised DASH diet and moderate exercise/walking, at least 150 mins/week      Typical atrial flutter ([employer])    During hospitalization in 2020 Was cardioverted On Eliquis Had bradycardia with Cardizem Followed by Cardiology        Endocrine   Type 2 diabetes mellitus with other specified complication ([place]) - Primary    Lab Results  Component Value Date   HGBA1C 8.1 (H) 02/13/2022  Uncontrolled, but improving On Levemir 25 U qHS and Janumet BID Offered Levemir flexpen, but patient prefers to inject insulin with syringe Advised to follow diabetic diet On ACEi and statin now F/u CMP and lipid panel Diabetic eye exam: Advised to follow up with Ophthalmology for diabetic eye exam      Relevant Medications   insulin detemir (LEVEMIR) 100 UNIT/ML injection   Other Relevant Orders   Hemoglobin A1c   CMP14+EGFR     Other   Crushing injury of left thumb    Followed by Orthopedic surgery      Other Visit Diagnoses     Chronic anticoagulation       Relevant Orders   CBC with Differential/Platelet   Need for zoster vaccination       Relevant Orders   Varicella-zoster vaccine IM (Completed)   Need for immunization against influenza       Relevant Orders   Flu Vaccine QUAD 72moIM (Fluarix, Fluzone & Alfiuria Quad PF) (Completed)       Meds ordered this encounter  Medications   insulin detemir (LEVEMIR) 100 UNIT/ML injection    Sig: INJECT 25 UNITS SUBCUTANEOUSLY DAILY    Dispense:  10 mL    Refill:  3     Follow-up: Return in about 4 months (around 10/17/2022) for DM.    RLindell Spar MD

## 2022-06-21 NOTE — Assessment & Plan Note (Signed)
Followed by Orthopedic surgery

## 2022-06-21 NOTE — Telephone Encounter (Signed)
Patient needs refill on   apixaban Arne [place]) 5 MG TABS tablet    [place] [place] - [address] [place], [place] [address] Phone: [phone]  Fax: [phone]

## 2022-06-21 NOTE — Assessment & Plan Note (Signed)
BP Readings from Last 1 Encounters:  06/18/22 138/74   Usually well-controlled with Losartan Counseled for compliance with the medications Advised DASH diet and moderate exercise/walking, at least 150 mins/week

## 2022-07-22 ENCOUNTER — Other Ambulatory Visit: Payer: Self-pay | Admitting: Internal Medicine

## 2022-07-22 DIAGNOSIS — E1165 Type 2 diabetes mellitus with hyperglycemia: Secondary | ICD-10-CM

## 2022-08-09 DIAGNOSIS — Z7901 Long term (current) use of anticoagulants: Secondary | ICD-10-CM | POA: Diagnosis not present

## 2022-08-09 DIAGNOSIS — E1169 Type 2 diabetes mellitus with other specified complication: Secondary | ICD-10-CM | POA: Diagnosis not present

## 2022-08-09 DIAGNOSIS — Z794 Long term (current) use of insulin: Secondary | ICD-10-CM | POA: Diagnosis not present

## 2022-08-10 LAB — CBC WITH DIFFERENTIAL/PLATELET
Basophils Absolute: 0.1 10*3/uL (ref 0.0–0.2)
Basos: 1 %
EOS (ABSOLUTE): 0.1 10*3/uL (ref 0.0–0.4)
Eos: 2 %
Hematocrit: 39.6 % (ref 37.5–51.0)
Hemoglobin: 13.2 g/dL (ref 13.0–17.7)
Immature Grans (Abs): 0 10*3/uL (ref 0.0–0.1)
Immature Granulocytes: 0 %
Lymphocytes Absolute: 1.9 10*3/uL (ref 0.7–3.1)
Lymphs: 21 %
MCH: 28.4 pg (ref 26.6–33.0)
MCHC: 33.3 g/dL (ref 31.5–35.7)
MCV: 85 fL (ref 79–97)
Monocytes Absolute: 0.6 10*3/uL (ref 0.1–0.9)
Monocytes: 7 %
Neutrophils Absolute: 6.6 10*3/uL (ref 1.4–7.0)
Neutrophils: 69 %
Platelets: 214 10*3/uL (ref 150–450)
RBC: 4.65 x10E6/uL (ref 4.14–5.80)
RDW: 13.1 % (ref 11.6–15.4)
WBC: 9.3 10*3/uL (ref 3.4–10.8)

## 2022-08-10 LAB — CMP14+EGFR
ALT: 15 IU/L (ref 0–44)
AST: 19 IU/L (ref 0–40)
Albumin/Globulin Ratio: 1.6 (ref 1.2–2.2)
Albumin: 4.5 g/dL (ref 3.8–4.9)
Alkaline Phosphatase: 67 IU/L (ref 44–121)
BUN/Creatinine Ratio: 13 (ref 9–20)
BUN: 15 mg/dL (ref 6–24)
Bilirubin Total: 0.3 mg/dL (ref 0.0–1.2)
CO2: 22 mmol/L (ref 20–29)
Calcium: 9.2 mg/dL (ref 8.7–10.2)
Chloride: 104 mmol/L (ref 96–106)
Creatinine, Ser: 1.15 mg/dL (ref 0.76–1.27)
Globulin, Total: 2.8 g/dL (ref 1.5–4.5)
Glucose: 128 mg/dL — ABNORMAL HIGH (ref 70–99)
Potassium: 4.2 mmol/L (ref 3.5–5.2)
Sodium: 142 mmol/L (ref 134–144)
Total Protein: 7.3 g/dL (ref 6.0–8.5)
eGFR: 74 mL/min/{1.73_m2} (ref >59)

## 2022-08-10 LAB — HEMOGLOBIN A1C
Est. average glucose Bld gHb Est-mCnc: 169 mg/dL
Hgb A1c MFr Bld: 7.5 % — ABNORMAL HIGH (ref 4.8–5.6)

## 2022-08-28 ENCOUNTER — Emergency Department (HOSPITAL_COMMUNITY): Payer: Medicaid Other

## 2022-08-28 ENCOUNTER — Other Ambulatory Visit: Payer: Self-pay

## 2022-08-28 ENCOUNTER — Emergency Department (HOSPITAL_COMMUNITY)
Admission: EM | Admit: 2022-08-28 | Discharge: 2022-08-28 | Disposition: A | Discharge status: Home / Self Care | Payer: Medicaid Other | Attending: Emergency Medicine | Admitting: Emergency Medicine

## 2022-08-28 DIAGNOSIS — I4891 Unspecified atrial fibrillation: Secondary | ICD-10-CM | POA: Insufficient documentation

## 2022-08-28 DIAGNOSIS — M25461 Effusion, right knee: Secondary | ICD-10-CM

## 2022-08-28 DIAGNOSIS — X501XXA Overexertion from prolonged static or awkward postures, initial encounter: Secondary | ICD-10-CM | POA: Diagnosis not present

## 2022-08-28 DIAGNOSIS — M1711 Unilateral primary osteoarthritis, right knee: Secondary | ICD-10-CM | POA: Diagnosis not present

## 2022-08-28 DIAGNOSIS — Z79899 Other long term (current) drug therapy: Secondary | ICD-10-CM | POA: Diagnosis not present

## 2022-08-28 DIAGNOSIS — M25561 Pain in right knee: Secondary | ICD-10-CM | POA: Diagnosis present

## 2022-08-28 DIAGNOSIS — Z794 Long term (current) use of insulin: Secondary | ICD-10-CM | POA: Diagnosis not present

## 2022-08-28 DIAGNOSIS — Z7984 Long term (current) use of oral hypoglycemic drugs: Secondary | ICD-10-CM | POA: Diagnosis not present

## 2022-08-28 DIAGNOSIS — E119 Type 2 diabetes mellitus without complications: Secondary | ICD-10-CM | POA: Diagnosis not present

## 2022-08-28 DIAGNOSIS — Z7901 Long term (current) use of anticoagulants: Secondary | ICD-10-CM | POA: Insufficient documentation

## 2022-08-28 DIAGNOSIS — I10 Essential (primary) hypertension: Secondary | ICD-10-CM | POA: Diagnosis not present

## 2022-08-28 MED ORDER — LIDOCAINE 5 % EX PTCH: 1.0000 | MEDICATED_PATCH | CUTANEOUS | 0 refills | Status: DC

## 2022-08-28 MED ORDER — LIDOCAINE 5 % EX PTCH
1.0000 | MEDICATED_PATCH | CUTANEOUS | Status: DC
  Administered 2022-08-28: 1 via TRANSDERMAL
  Filled 2022-08-28: qty 1

## 2022-08-28 MED ORDER — OXYCODONE-ACETAMINOPHEN 5-325 MG PO TABS: 1.0000 | ORAL_TABLET | Freq: Three times a day (TID) | ORAL | 0 refills | Status: DC | PRN

## 2022-08-28 NOTE — ED Provider Notes (Signed)
[facility] EMERGENCY DEPARTMENT Provider Note   CSN: [id number] Arrival date & time: 08/28/22  0932     History  Chief Complaint  Patient presents with   Knee Pain    Jeremy Burgess is a 57 y.o. male with a history including type 2 diabetes, hypertension, paroxysmal A-fib on Eliquis, surgical history significant for right femur nail secondary to fracture that occurred when he was struck by a falling tree in 2016, has some chronic pain issues in this right lower extremity but over the past several days has had increased pain and swelling of his right knee.  He noted a full sensation behind his knee along with some tenderness along his right outer knee joint space and has developed swelling in the knee along with a popping sensation with significant range of motion.  He denies any new injuries or overuse.  Has had no fevers or chills, he does not have a history of gout.  He has had no medications prior to arrival for his symptoms, he has employed rest, minimal pain when he is not moving the joint.  The history is provided by the patient.       Home Medications Prior to Admission medications   Medication Sig Start Date End Date Taking? Authorizing Provider  lidocaine (LIDODERM) 5 % Place 1 patch onto the skin daily. Remove & Discard patch within 12 hours or as directed by MD 08/28/22  Yes Lyman Balingit, Almyra Free, PA-C  oxyCODONE-acetaminophen (PERCOCET/ROXICET) 5-325 MG tablet Take 1 tablet by mouth every 8 (eight) hours as needed. 08/28/22  Yes Cameron Katayama, Almyra Free, PA-C  amLODipine (NORVASC) 10 MG tablet Take 1 tablet (10 mg total) by mouth daily. 02/13/22   Lindell Spar, MD  apixaban (ELIQUIS) 5 MG TABS tablet Take 1 tablet (5 mg total) by mouth 2 (two) times daily. 06/21/22   Lindell Spar, MD  atorvastatin (LIPITOR) 10 MG tablet Take 1 tablet (10 mg total) by mouth daily. 02/13/22   Lindell Spar, MD  Cholecalciferol (VITAMIN D-3) 125 MCG (5000 UT) TABS Take 15,000 mg by mouth once a week.    [provider]  glucose blood (ACCU-CHEK GUIDE) test strip USE 1 STRIP TO CHECK GLUCOSE TWICE DAILY 07/22/22   Lindell Spar, MD  insulin detemir (LEVEMIR) 100 UNIT/ML injection INJECT 25 UNITS SUBCUTANEOUSLY DAILY 06/18/22   Lindell Spar, MD  Insulin Syringe-Needle U-100 (SURE COMFORT INSULIN SYRINGE) 31G X 5/16" 1 ML MISC USE AS DIRECTED. 10/12/21   Patel, Colin Broach, MD  JANUMET XR 50-1000 MG TB24 Take 2 tablets by mouth daily. 02/13/22   Lindell Spar, MD  losartan (COZAAR) 100 MG tablet Take 1 tablet (100 mg total) by mouth daily. 02/13/22   Lindell Spar, MD      Allergies    Bee venom and Lisinopril    Review of Systems   Review of Systems  Constitutional:  Negative for chills and fever.  Musculoskeletal:  Positive for arthralgias and joint swelling. Negative for myalgias.  Neurological:  Negative for weakness and numbness.  All other systems reviewed and are negative.   Physical Exam Updated Vital Signs BP (!) 154/80   Pulse 76   Temp 98.3 F (36.8 C) (Oral)   Resp 18   Ht '5\' 11"'$  (1.803 m)   Wt 111.1 kg   SpO2 100%   BMI 34.17 kg/m  Physical Exam Constitutional:      Appearance: He is well-developed.  HENT:     Head:  Atraumatic.  Cardiovascular:     Comments: Pulses equal bilaterally Musculoskeletal:        General: Swelling and tenderness present.     Cervical back: Normal range of motion.     Right lower leg: No edema.     Left lower leg: No edema.     Comments: Suprapatella effusion appreciated right knee.  TTP lateral knee joint space,  subtle edema posterior knee suggesting bakers cyst.  No erythema or skin changes.  ROM limited by pain, positive crepitus.   Calf nontender.   Skin:    General: Skin is warm and dry.  Neurological:     Mental Status: He is alert.     Sensory: No sensory deficit.     Motor: No weakness.     Deep Tendon Reflexes: Reflexes normal.     ED Results / Procedures / Treatments   Labs (all labs ordered are listed, but only  abnormal results are displayed) Labs Reviewed - No data to display  EKG None  Radiology DG Knee Complete 4 Views Right  Result Date: 08/28/2022 CLINICAL DATA:  Pain and effusion. EXAM: RIGHT KNEE - COMPLETE 4+ VIEW COMPARISON:  None Available. FINDINGS: No evidence of acute fracture or dislocation intramedullary nail in the femur with horizontal transfixing screws. No perihardware loosening. Tricompartmental knee joint space narrowing with marginal osteophytes prominent in the medial tibiofemoral and patellofemoral compartments. Small suprapatellar knee joint effusion. IMPRESSION: 1. No acute fracture or dislocation. 2. Moderate tricompartmental osteoarthritis, worst in the medial tibiofemoral and patellofemoral compartments. Electronically Signed   By: Keane Police D.O.   On: 08/28/2022 10:27    Procedures Procedures    Medications Ordered in ED Medications  lidocaine (LIDODERM) 5 % 1 patch (1 patch Transdermal Patch Applied 08/28/22 1138)    ED Course/ Medical Decision Making/ A&P                             Medical Decision Making Patient presenting with a right knee effusion, has advanced osteoarthritis, possibly trauma induced secondary to prior femur fracture, no new obvious injury or overuse, patient states he has had an effusion in the past which has been drained but it has been sometime.  Afebrile, no history of gout.  His exam does not suggest a septic joint.  He does have hardware, femur pin but has been present x 7 years.  We discussed RICE treatment, he was placed in a knee immobilizer, pain medicine is prescribed along with a Lidoderm pain patch.  He was advised close follow-up with orthopedics, referral was given back to Dr. Amedeo Kinsman.  We discussed nonweightbearing, he states he has a walker, crutches and a cane at home from his prior injuries.  Amount and/or Complexity of Data Reviewed Radiology: ordered and independent interpretation performed.    Details: Reviewed, agree  with small effusion, advanced degenerative joint disease.  Risk Prescription drug management.           Final Clinical Impression(s) / ED Diagnoses Final diagnoses:  Effusion of right knee  Osteoarthritis of right knee, unspecified osteoarthritis type    Rx / DC Orders ED Discharge Orders          Ordered    oxyCODONE-acetaminophen (PERCOCET/ROXICET) 5-325 MG tablet  Every 8 hours PRN        08/28/22 1047    lidocaine (LIDODERM) 5 %  Every 24 hours        08/28/22 1047  Evalee Jefferson, PA-C 08/28/22 1149    Cristie Hem, MD 08/29/22 [phone]

## 2022-08-28 NOTE — Discharge Instructions (Signed)
Wear the knee immobilizer as much it is comfortable to do, minimizing movement of your knee joint will help minimize further accumulation of swelling in the knee joint.  Your x-ray confirms that you have advanced arthritis in the knee and will require the assistance of orthopedics.  Please call Dr. Amedeo Kinsman for further evaluation and management of your symptoms.  I am prescribing you several medications to help you with discomfort, ice and elevation will also help with swelling and pain as well.  Do not drive within 4 hours of taking oxycodone as this medication will make you drowsy.

## 2022-08-28 NOTE — ED Triage Notes (Signed)
Pt c/o right knee pain since yesterday. No injury noted.

## 2022-08-30 ENCOUNTER — Telehealth: Payer: Self-pay | Admitting: *Deleted

## 2022-08-30 NOTE — Patient Outreach (Signed)
  Care Coordination [facility] Note Transition Care Management Follow-up Telephone Call Date of discharge and from where: 08/28/22 from [facility] ED How have you been since you were released from the hospital? Patient reports he is doing ok Any questions or concerns? No  Items Reviewed: Did the pt receive and understand the discharge instructions provided? Yes  Medications obtained and verified? Yes  Other? No  Any new allergies since your discharge? No  Dietary orders reviewed? No Do you have support at home? Yes   Home Care and Equipment/Supplies: Were home health services ordered? no If so, what is the name of the agency? N/A  Has the agency set up a time to come to the patient's home? not applicable Were any new equipment or medical supplies ordered?  No What is the name of the medical supply agency? N/A Were you able to get the supplies/equipment? not applicable Do you have any questions related to the use of the equipment or supplies? No  Functional Questionnaire: (I = Independent and D = Dependent) ADLs: I  Bathing/Dressing- I  Meal Prep- I  Eating- I  Maintaining continence- I  Transferring/Ambulation- I  Managing Meds- I  Follow up appointments reviewed:  PCP Hospital f/u appt confirmed?  N/A, ED visit  Scheduled to see PCP on 10/18/22  Specialist Hospital f/u appt confirmed?  N/A, ED visit . Are transportation arrangements needed? No  If their condition worsens, is the pt aware to call PCP or go to the Emergency Dept.? Yes Was the patient provided with contact information for the PCP's office or ED? No Was to pt encouraged to call back with questions or concerns? Yes  SDOH assessments and interventions completed:   Yes  RNCM provided information for Case Management with [facility] Care Management Team. Patient declines CM services at this time.  Lurena Joiner RN, BSN [place]  [employer] manager

## 2022-09-25 ENCOUNTER — Encounter: Payer: Self-pay | Admitting: Cardiology

## 2022-09-25 ENCOUNTER — Ambulatory Visit: Payer: Medicaid Other | Attending: Cardiology | Admitting: Cardiology

## 2022-09-25 ENCOUNTER — Telehealth: Payer: Self-pay | Admitting: Cardiology

## 2022-09-25 VITALS — BP 150/90 | HR 64 | Ht 71.0 in | Wt 237.2 lb

## 2022-09-25 DIAGNOSIS — I483 Typical atrial flutter: Secondary | ICD-10-CM

## 2022-09-25 DIAGNOSIS — Z79899 Other long term (current) drug therapy: Secondary | ICD-10-CM

## 2022-09-25 DIAGNOSIS — I10 Essential (primary) hypertension: Secondary | ICD-10-CM

## 2022-09-25 DIAGNOSIS — D6869 Other thrombophilia: Secondary | ICD-10-CM | POA: Diagnosis not present

## 2022-09-25 MED ORDER — CHLORTHALIDONE 25 MG PO TABS: 12.5000 mg | ORAL_TABLET | Freq: Every day | ORAL | 3 refills | Status: DC

## 2022-09-25 NOTE — Patient Instructions (Signed)
Medication Instructions:  Your physician has recommended you make the following change in your medication:  - Start Chlorthalidone 12.5 mg tablets once daily   Labwork: In 2 Weeks: -BMET -MAG  Testing/Procedures: None  Follow-Up: Follow up with Dr. Harl Bowie in 6 months.   Any Other Special Instructions Will Be Listed Below (If Applicable).     If you need a refill on your cardiac medications before your next appointment, please call your pharmacy.

## 2022-09-25 NOTE — Progress Notes (Signed)
Clinical Summary Jeremy Burgess is a 57 y.o.male seen today for follow up of the following medical problems.   1. Atrial flutter - new diagnosis during Jan 2020 admission - at that time started on anticoag, later cardioverted 10/06/2018 - during 10/23/2018 visit was still in [place] - currently on eliquis. Cardizem 120 stopped after cardioversion due to some low heart rates.   - s/p DCCV 09/2018, we actually have not seen recurrent aflutter since that time   - no recent palpitations -compliant with meds. No bleeding on eliquis.    2. HTN - he is compliant with meds   3. Chest pain - 05/2020 visit with PA Leonides Sake reported left sided chest pain - 05/2020 nuclear stress: no ischemia.    - no recent chest pains.    4. DM2 - he is on atorvastatin 58m daily.    02/2022 TC 143 TG 85 HDL 42 LDL 85 Past Medical History:  Diagnosis Date   Accidentally struck by falling tree 02/08/2015   Acute blood loss anemia 02/09/2015   Acute respiratory failure ([facility] 02/09/2015   [place]ute respiratory failure ([facility] 02/09/2015   Ankle fracture, right 02/09/2015   Arthritis    Asthma    Atrial flutter ([facility]    a. diagnosed in 08/2018 b. s/p DCCV in 09/2018 with return to [facility]   Chronic pain due to trauma    Closed fracture of right ulna 02/08/2015   Closed right pilon fracture 02/23/2015   Diabetes mellitus    Diabetes mellitus ([facility]    Femur fracture, right ([facility] 02/08/2015   Heart murmur    History of blood clots    DVT   Hypertension    Injury of hand, extensor tendon, right, sequela 09/01/2018   Pneumothorax    Postlaminectomy syndrome, lumbar region 11/13/2015   Rib fracture    [place]rombocyt[place] 02/09/2015   Traumatic closed displaced fracture of multiple ribs of right side 02/08/2015   Traumatic hemopneumothorax 02/09/2015   Upper GI bleed 05/26/2016   Upper GI bleeding 05/27/2016   Trivial. EGD unremarkable.     Allergies  Allergen Reactions   Bee Venom Anaphylaxis    HONEY BEES    Lisinopril Cough     Current Outpatient Medications  Medication Sig Dispense Refill   amLODipine (NORVASC) 10 MG tablet Take 1 tablet (10 mg total) by mouth daily. 90 tablet 3   apixaban (ELIQUIS) 5 MG TABS tablet Take 1 tablet (5 mg total) by mouth 2 (two) times daily. 60 tablet 5   atorvastatin (LIPITOR) 10 MG tablet Take 1 tablet (10 mg total) by mouth daily. 90 tablet 3   Cholecalciferol (VITAMIN D-3) 125 MCG (5000 UT) TABS Take 15,000 mg by mouth once a week.     glucose blood (ACCU-CHEK GUIDE) test strip USE 1 STRIP TO CHECK GLUCOSE TWICE DAILY 50 each 0   insulin detemir (LEVEMIR) 100 UNIT/ML injection INJECT 25 UNITS SUBCUTANEOUSLY DAILY 10 mL 3   Insulin Syringe-Needle U-100 (SURE COMFORT INSULIN SYRINGE) 31G X 5/16" 1 ML MISC USE AS DIRECTED. 100 each 11   JANUMET XR 50-1000 MG TB24 Take 2 tablets by mouth daily. 180 tablet 3   lidocaine (LIDODERM) 5 % Place 1 patch onto the skin daily. Remove & Discard patch within 12 hours or as directed by MD 30 patch 0   losartan (COZAAR) 100 MG tablet Take 1 tablet (100 mg total) by mouth daily. 90 tablet 3   oxyCODONE-acetaminophen (PERCOCET/ROXICET) 5-325 MG tablet Take  1 tablet by mouth every 8 (eight) hours as needed. 15 tablet 0   No current facility-administered medications for this visit.     Past Surgical History:  Procedure Laterality Date   BACK SURGERY     CARDIOVERSION N/A 10/06/2018   Procedure: CARDIOVERSION;  Surgeon: [name], MD;  Location: [facility];  Service: Cardiovascular;  Laterality: N/A;   CHOLECYSTECTOMY N/A 10/30/2016   Procedure: LAPAROSCOPIC CHOLECYSTECTOMY;  Surgeon: [name], MD;  Location: [facility];  Service: General;  Laterality: N/A;   COLONOSCOPY N/A 07/25/2016   Dr. [name]: 9 mm tubular adenoma. 5 year surveillance    ESOPHAGOGASTRODUODENOSCOPY  2009   EGD completed by Dr. [name] due to hematemesis. Findings of normal esophagus, tiny antral erosions, normal D1, D2   ESOPHAGOGASTRODUODENOSCOPY N/A  05/27/2016   Dr. [name]: normal    EXTERNAL FIXATION LEG Right 02/09/2015   Procedure: EXTERNAL FIXATION LEG;  Surgeon: [name], MD;  Location: [place];  Service: Orthopedics;  Laterality: Right;   EXTERNAL FIXATION REMOVAL Right 02/14/2015   Procedure: REMOVAL EXTERNAL FIXATION LEG;  Surgeon: [name], MD;  Location: [place];  Service: Orthopedics;  Laterality: Right;   FEMUR IM NAIL Right 02/09/2015   Procedure: INTRAMEDULLARY (IM) RETROGRADE FEMORAL NAILING;  Surgeon: [name], MD;  Location: [place];  Service: Orthopedics;  Laterality: Right;   NOSE SURGERY     ORIF ANKLE FRACTURE Right 02/14/2015   Procedure: OPEN REDUCTION INTERNAL FIXATION (ORIF) PILON FRACTURE;  Surgeon: [name], MD;  Location: [place];  Service: Orthopedics;  Laterality: Right;   ORIF ULNAR FRACTURE Right 02/09/2015   Procedure: OPEN REDUCTION INTERNAL FIXATION (ORIF) ULNAR FRACTURE;  Surgeon: [name], MD;  Location: [place];  Service: Orthopedics;  Laterality: Right;   POLYPECTOMY  07/25/2016   Procedure: POLYPECTOMY;  Surgeon: [name], MD;  [facility];  Service: Endoscopy;;  colon     Allergies  Allergen Reactions   Bee Venom Anaphylaxis    HONEY BEES   Lisinopril Cough      Family History  Problem Relation Age of Onset   Diabetes Mother    COPD Mother    CAD Father        CABG in his 52's   Diabetes Sister    Atrial fibrillation Brother    Colon cancer Neg Hx      Social History Jeremy Burgess reports that he has never smoked. He has never used smokeless tobacco. Jeremy Burgess reports no history of alcohol use.   Review of Systems CONSTITUTIONAL: No weight loss, fever, chills, weakness or fatigue.  HEENT: Eyes: No visual loss, blurred vision, double vision or yellow sclerae.No hearing loss, sneezing, congestion, runny nose or sore throat.  SKIN: No rash or itching.  CARDIOVASCULAR: per hpi RESPIRATORY: No shortness of breath, cough or sputum.   GASTROINTESTINAL: No anorexia, nausea, vomiting or diarrhea. No abdominal pain or blood.  GENITOURINARY: No burning on urination, no polyuria NEUROLOGICAL: No headache, dizziness, syncope, paralysis, ataxia, numbness or tingling in the extremities. No change in bowel or bladder control.  MUSCULOSKELETAL: No muscle, back pain, joint pain or stiffness.  LYMPHATICS: No enlarged nodes. No history of splenectomy.  PSYCHIATRIC: No history of depression or anxiety.  ENDOCRINOLOGIC: No reports of sweating, cold or heat intolerance. No polyuria or polydipsia.  [name]   Physical Examination Today's Vitals   09/25/22 1020  BP: 138/86  Pulse: 64  SpO2: 95%  Weight: 237 lb 3.2 oz (  107.6 kg)  Height: 5' 11"$  (1.803 m)   Body mass index is 33.08 kg/m.  Gen: resting comfortably, no acute distress HEENT: no scleral icterus, pupils equal round and reactive, no palptable cervical adenopathy,  CV: RRR, no m/r/g no jvd Resp: Clear to auscultation bilaterally GI: abdomen is soft, non-tender, non-distended, normal bowel sounds, no hepatosplenomegaly MSK: extremities are warm, no edema.  Skin: warm, no rash Neuro:  no focal deficits Psych: appropriate affect   Diagnostic Studies  Echocardiogram: 08/2018   Study Conclusions - Left ventricle: The cavity size was normal. Wall thickness was   normal. Systolic function was normal. The estimated ejection   fraction was in the range of 60% to 65%. Wall motion was normal;   there were no regional wall motion abnormalities. The study is   not technically sufficient to allow evaluation of LV diastolic   function. - Aortic valve: Valve area (VTI): 2.83 cm^2. Valve area (Vmax):   2.61 cm^2. - Left atrium: The atrium was moderately to severely dilated. - Right atrium: The atrium was mildly dilated. - Atrial septum: No defect or patent foramen ovale was identified.     05/2020 nuclear stress There was no ST segment deviation noted during stress. The study  is normal. There are no perfusion defects This is a low risk study. The left ventricular ejection fraction is normal (55-65%).   Assessment and Plan   Aflutter/acquired thrombophilia - s/p DCCV 09/2018, we actually have not seen recurrent aflutter since that time - denies any symptoms, continue eliquis for stroke prevention.    2. HTN -above goal, start chlorthalidone 12.48m daily - check bmet/mg 2 weeks.    F/u 6 months     JArnoldo Lenis M.D.

## 2022-09-25 NOTE — Telephone Encounter (Signed)
Patient notified that Chlorthalidone is an added on medication that will not be replacing any of his current cardiac medications. Patient verbalized understanding. Patient had no further questions or concerns at this time.

## 2022-09-25 NOTE — Telephone Encounter (Signed)
  Pt c/o medication issue:  1. Name of Medication:   chlorthalidone (HYGROTON) 25 MG tablet    2. How are you currently taking this medication (dosage and times per day)?   Take 0.5 tablets (12.5 mg total) by mouth daily.    3. Are you having a reaction (difficulty breathing--STAT)? No   4. What is your medication issue? Pt said, this is a new prescription and wanted to ask if this is a add on medication or will be replacing one of his current heart medications

## 2022-10-09 ENCOUNTER — Other Ambulatory Visit (HOSPITAL_COMMUNITY)
Admission: RE | Admit: 2022-10-09 | Discharge: 2022-10-09 | Disposition: A | Discharge status: Home / Self Care | Payer: Medicaid Other | Source: Ambulatory Visit | Attending: Cardiology | Admitting: Cardiology

## 2022-10-09 DIAGNOSIS — Z79899 Other long term (current) drug therapy: Secondary | ICD-10-CM | POA: Diagnosis present

## 2022-10-09 LAB — BASIC METABOLIC PANEL
Anion gap: 8 (ref 5–15)
BUN: 25 mg/dL — ABNORMAL HIGH (ref 6–20)
CO2: 22 mmol/L (ref 22–32)
Calcium: 8.8 mg/dL — ABNORMAL LOW (ref 8.9–10.3)
Chloride: 106 mmol/L (ref 98–111)
Creatinine, Ser: 1.5 mg/dL — ABNORMAL HIGH (ref 0.61–1.24)
GFR, Estimated: 53 mL/min — ABNORMAL LOW (ref >60)
Glucose, Bld: 131 mg/dL — ABNORMAL HIGH (ref 70–99)
Potassium: 4.5 mmol/L (ref 3.5–5.1)
Sodium: 136 mmol/L (ref 135–145)

## 2022-10-09 LAB — MAGNESIUM: Magnesium: 1.3 mg/dL — ABNORMAL LOW (ref 1.7–2.4)

## 2022-10-10 ENCOUNTER — Encounter: Payer: Self-pay | Admitting: Radiology

## 2022-10-15 ENCOUNTER — Telehealth: Payer: Self-pay

## 2022-10-15 DIAGNOSIS — Z79899 Other long term (current) drug therapy: Secondary | ICD-10-CM

## 2022-10-15 MED ORDER — MAGNESIUM OXIDE 400 MG PO TABS: ORAL_TABLET | ORAL | 3 refills | Status: AC

## 2022-10-15 MED ORDER — CHLORTHALIDONE 25 MG PO TABS: 12.5000 mg | ORAL_TABLET | ORAL | 3 refills | Status: DC

## 2022-10-15 NOTE — Telephone Encounter (Signed)
Patient notified and verbalized understanding. Patient had no questions or concerns at this time. 

## 2022-10-15 NOTE — Telephone Encounter (Signed)
-----   Message from Arnoldo Lenis, MD sent at 10/14/2022  9:48 AM EST ----- Magnesium is low, mild decrease in kidney function. Can he try taking the chlorthalidone 12.'5mg'$  every other day. Needs to stay well hdyrated, at least 4-6 bottles of water (2-3L) per day. Magnesium is low, start magnesium oxide '400mg'$  bid x 5 days then '400mg'$  daily. Recheck bmet/mg in 2 weeks  Zandra Abts MD

## 2022-10-18 ENCOUNTER — Ambulatory Visit (INDEPENDENT_AMBULATORY_CARE_PROVIDER_SITE_OTHER): Payer: Medicaid Other | Admitting: Internal Medicine

## 2022-10-18 ENCOUNTER — Encounter: Payer: Self-pay | Admitting: Internal Medicine

## 2022-10-18 VITALS — BP 136/66 | HR 65 | Ht 71.0 in | Wt 233.0 lb

## 2022-10-18 DIAGNOSIS — E782 Mixed hyperlipidemia: Secondary | ICD-10-CM

## 2022-10-18 DIAGNOSIS — I10 Essential (primary) hypertension: Secondary | ICD-10-CM

## 2022-10-18 DIAGNOSIS — R140 Abdominal distension (gaseous): Secondary | ICD-10-CM | POA: Insufficient documentation

## 2022-10-18 DIAGNOSIS — Z794 Long term (current) use of insulin: Secondary | ICD-10-CM | POA: Diagnosis not present

## 2022-10-18 DIAGNOSIS — E1169 Type 2 diabetes mellitus with other specified complication: Secondary | ICD-10-CM | POA: Diagnosis not present

## 2022-10-18 DIAGNOSIS — S6702XD Crushing injury of left thumb, subsequent encounter: Secondary | ICD-10-CM | POA: Diagnosis not present

## 2022-10-18 DIAGNOSIS — I483 Typical atrial flutter: Secondary | ICD-10-CM | POA: Diagnosis not present

## 2022-10-18 MED ORDER — GLIPIZIDE 5 MG PO TABS: 5.0000 mg | ORAL_TABLET | Freq: Two times a day (BID) | ORAL | 3 refills | Status: DC

## 2022-10-18 MED ORDER — INSULIN DETEMIR 100 UNIT/ML ~~LOC~~ SOLN: SUBCUTANEOUS | 3 refills | Status: DC

## 2022-10-18 NOTE — Progress Notes (Signed)
Established Patient Office Visit  Subjective:  Patient ID: Jeremy Burgess, male    DOB: July 15, 1966  Age: 57 y.o. MRN: [id number]  CC:  Chief Complaint  Patient presents with   Diabetes    Four month follow up for diabetes. Patient is complaining of being bloated    HPI Jeremy Burgess is a 57 y.o. male with past medical history of HTN, atrial flutter, type II DM with neuropathy, chronic low back pain, chronic right ankle pain due to trauma and obesity who presents for f/u of his chronic medical conditions.  He had crushing injury of left thumb, for which he went to ER and had followed up with orthopedic surgeon.  His wound has been slowly healing. He reports ingrown nail.  Denies any active bleeding, but has tingling over the thumb area.  Type II DM: His HbA1c was 7.5 in 12/23.  He takes Levemir 25 U qHS and Janumet twice daily.  He prefers to take insulin with syringe, and does not want flexpen for now.  He has not tried other oral hypoglycemic agent.  He denies any polyuria or polydipsia or polyphagia. He has not brought his glucometer log today, but states that his blood glucose ranges around 150.  HTN: BP is well-controlled. Takes medications regularly.  He was recently placed on chlorthalidone for elevated BP by cardiology.  His serum creatinine increased after starting it, and was advised to take it every other day, but report reports taking once daily currently.  Patient denies headache, dizziness, chest pain, dyspnea or palpitations.   Past Medical History:  Diagnosis Date   Accidentally struck by falling tree 02/08/2015   Acute blood loss anemia 02/09/2015   Acute respiratory failure ([place]) 02/09/2015   Acute respiratory failure ([place]) 02/09/2015   Ankle fracture, right 02/09/2015   Arthritis    Asthma    Atrial flutter ([place])    a. diagnosed in 08/2018 b. s/p DCCV in 09/2018 with return to [facility]   Chronic pain due to trauma    Closed fracture of right ulna 02/08/2015   Closed  right pilon fracture 02/23/2015   Diabetes mellitus    Diabetes mellitus ([place])    Femur fracture, right ([place]) 02/08/2015   Heart murmur    History of blood clots    DVT   Hypertension    Injury of hand, extensor tendon, right, sequela 09/01/2018   Pneumothorax    Postlaminectomy syndrome, lumbar region 11/13/2015   Rib fracture    Thrombocytopenia ([place]) 02/09/2015   Traumatic closed displaced fracture of multiple ribs of right side 02/08/2015   Traumatic hemopneumothorax 02/09/2015   Upper GI bleed 05/26/2016   Upper GI bleeding 05/27/2016   Trivial. EGD unremarkable.    Past Surgical History:  Procedure Laterality Date   BACK SURGERY     CARDIOVERSION N/A 10/06/2018   Procedure: CARDIOVERSION;  Surgeon: [name], MD;  Location: [facility];  Service: Cardiovascular;  Laterality: N/A;   CHOLECYSTECTOMY N/A 10/30/2016   Procedure: LAPAROSCOPIC CHOLECYSTECTOMY;  Surgeon: [name], MD;  Location: [facility];  Service: General;  Laterality: N/A;   COLONOSCOPY N/A 07/25/2016   Dr. [name]: 9 mm tubular adenoma. 5 year surveillance    ESOPHAGOGASTRODUODENOSCOPY  2009   EGD completed by Dr. [name] due to hematemesis. Findings of normal esophagus, tiny antral erosions, normal D1, D2   ESOPHAGOGASTRODUODENOSCOPY N/A 05/27/2016   Dr. [name]: normal    EXTERNAL FIXATION LEG Right 02/09/2015   Procedure: EXTERNAL FIXATION LEG;  Surgeon: [name], MD;  Location: [facility];  Service: Orthopedics;  Laterality: Right;   EXTERNAL FIXATION REMOVAL Right 02/14/2015   Procedure: REMOVAL EXTERNAL FIXATION LEG;  Surgeon: [name], MD;  Location: [facility];  Service: Orthopedics;  Laterality: Right;   FEMUR IM NAIL Right 02/09/2015   Procedure: INTRAMEDULLARY (IM) RETROGRADE FEMORAL NAILING;  Surgeon: [name], MD;  Location: [facility];  Service: Orthopedics;  Laterality: Right;   NOSE SURGERY     ORIF ANKLE FRACTURE Right 02/14/2015   Procedure: OPEN REDUCTION INTERNAL FIXATION (ORIF) PILON FRACTURE;   Surgeon: [name], MD;  Location: [facility];  Service: Orthopedics;  Laterality: Right;   ORIF ULNAR FRACTURE Right 02/09/2015   Procedure: OPEN REDUCTION INTERNAL FIXATION (ORIF) ULNAR FRACTURE;  Surgeon: [name], MD;  Location: [facility];  Service: Orthopedics;  Laterality: Right;   POLYPECTOMY  07/25/2016   Procedure: POLYPECTOMY;  Surgeon: [name], MD;  [facility];  Service: Endoscopy;;  colon    Family History  Problem Relation Age of Onset   Diabetes Mother    COPD Mother    CAD Father        CABG in his 32's   Diabetes Sister    Atrial fibrillation Brother    Colon cancer Neg Hx     Social History   Socioeconomic History   Marital status: Single    Spouse name: Not on file   Number of children: Not on file   Years of education: Not on file   Highest education level: Not on file  Occupational History   Not on file  Tobacco Use   Smoking status: Never   Smokeless tobacco: Never  Vaping Use   Vaping Use: Never used  Substance and Sexual Activity   Alcohol use: No    Comment: history of alcohol abuse but quit in 2008    Drug use: No   Sexual activity: Yes  Other Topics Concern   Not on file  Social History Narrative       Single.Lives alone.On disability secondary to tree fall accident 2016.   Social Determinants of Health   Financial Resource Strain: Medium Risk (06/27/2020)   Overall Financial Resource Strain (CARDIA)    Difficulty of Paying Living Expenses: Somewhat hard  Food Insecurity: Not on file  Transportation Needs: No Transportation Needs (08/30/2022)   PRAPARE - Hydrologist (Medical): No    Lack of Transportation (Non-Medical): No  Physical Activity: Not on file  Stress: Not on file  Social Connections: Not on file  Intimate Partner Violence: Not on file    Outpatient Medications Prior to Visit  Medication Sig Dispense Refill   amLODipine (NORVASC) 10 MG tablet Take 1 tablet (10 mg  total) by mouth daily. 90 tablet 3   apixaban (ELIQUIS) 5 MG TABS tablet Take 1 tablet (5 mg total) by mouth 2 (two) times daily. 60 tablet 5   atorvastatin (LIPITOR) 10 MG tablet Take 1 tablet (10 mg total) by mouth daily. 90 tablet 3   chlorthalidone (HYGROTON) 25 MG tablet Take 0.5 tablets (12.5 mg total) by mouth every other day. 22 tablet 3   Cholecalciferol (VITAMIN D-3) 125 MCG (5000 UT) TABS Take 15,000 mg by mouth once a week.     glucose blood (ACCU-CHEK GUIDE) test strip USE 1 STRIP TO CHECK GLUCOSE TWICE DAILY 50 each 0   Insulin Syringe-Needle U-100 (SURE COMFORT INSULIN SYRINGE) 31G X  5/16" 1 ML MISC USE AS DIRECTED. 100 each 11   JANUMET XR 50-1000 MG TB24 Take 2 tablets by mouth daily. 180 tablet 3   lidocaine (LIDODERM) 5 % Place 1 patch onto the skin daily. Remove & Discard patch within 12 hours or as directed by MD (Patient not taking: Reported on 09/25/2022) 30 patch 0   losartan (COZAAR) 100 MG tablet Take 1 tablet (100 mg total) by mouth daily. 90 tablet 3   magnesium oxide (MAG-OX) 400 MG tablet Take 1 tablet (400 mg total) by mouth 2 (two) times daily for 5 days, THEN 1 tablet (400 mg total) daily. 90 tablet 3   oxyCODONE-acetaminophen (PERCOCET/ROXICET) 5-325 MG tablet Take 1 tablet by mouth every 8 (eight) hours as needed. (Patient not taking: Reported on 09/25/2022) 15 tablet 0   insulin detemir (LEVEMIR) 100 UNIT/ML injection INJECT 25 UNITS SUBCUTANEOUSLY DAILY 10 mL 3   No facility-administered medications prior to visit.    Allergies  Allergen Reactions   Bee Venom Anaphylaxis    HONEY BEES   Lisinopril Cough    ROS Review of Systems  Constitutional:  Negative for chills and fever.  HENT:  Negative for congestion and sore throat.   Eyes:  Negative for pain and discharge.  Respiratory:  Negative for cough and shortness of breath.   Cardiovascular:  Negative for chest pain and palpitations.  Gastrointestinal:  Negative for constipation, diarrhea, nausea and  vomiting.  Endocrine: Negative for polydipsia and polyuria.  Genitourinary:  Negative for dysuria and hematuria.  Musculoskeletal:  Positive for arthralgias and back pain. Negative for neck pain and neck stiffness.  Skin:  Positive for wound. Negative for rash.  Neurological:  Positive for numbness. Negative for dizziness, weakness and headaches.  Psychiatric/Behavioral:  Negative for agitation and behavioral problems.       Objective:    Physical Exam Vitals reviewed.  Constitutional:      General: He is not in acute distress.    Appearance: He is obese. He is not diaphoretic.  HENT:     Head: Normocephalic and atraumatic.     Nose: Nose normal.     Mouth/Throat:     Mouth: Mucous membranes are moist.  Eyes:     General: No scleral icterus.    Extraocular Movements: Extraocular movements intact.  Cardiovascular:     Rate and Rhythm: Normal rate and regular rhythm.     Pulses: Normal pulses.     Heart sounds: Normal heart sounds. No murmur heard. Pulmonary:     Breath sounds: Normal breath sounds. No wheezing or rales.  Abdominal:     Palpations: Abdomen is soft.     Tenderness: There is no abdominal tenderness. There is no right CVA tenderness or left CVA tenderness.  Musculoskeletal:     Cervical back: Neck supple. No tenderness.     Right lower leg: No edema.     Left lower leg: No edema.     Comments: Crush injury of left thumb, no active bleeding or discharge  Skin:    General: Skin is warm.     Findings: No rash.  Neurological:     General: No focal deficit present.     Mental Status: He is alert and oriented to person, place, and time.     Sensory: Sensory deficit present.     Motor: No weakness.  Psychiatric:        Mood and Affect: Mood normal.        Behavior: Behavior  normal.     BP 136/66 (BP Location: Left Arm, Patient Position: Sitting, Cuff Size: Large)   Pulse 65   Ht '5\' 11"'$  (1.803 m)   Wt 233 lb (105.7 kg)   SpO2 95%   BMI 32.50 kg/m  Wt  Readings from Last 3 Encounters:  10/18/22 233 lb (105.7 kg)  09/25/22 237 lb 3.2 oz (107.6 kg)  08/28/22 245 lb (111.1 kg)    Lab Results  Component Value Date   TSH 2.720 02/13/2022   Lab Results  Component Value Date   WBC 9.3 08/09/2022   HGB 13.2 08/09/2022   HCT 39.6 08/09/2022   MCV 85 08/09/2022   PLT 214 08/09/2022   Lab Results  Component Value Date   NA 136 10/09/2022   K 4.5 10/09/2022   CO2 22 10/09/2022   GLUCOSE 131 (H) 10/09/2022   BUN 25 (H) 10/09/2022   CREATININE 1.50 (H) 10/09/2022   BILITOT 0.3 08/09/2022   ALKPHOS 67 08/09/2022   AST 19 08/09/2022   ALT 15 08/09/2022   PROT 7.3 08/09/2022   ALBUMIN 4.5 08/09/2022   CALCIUM 8.8 (L) 10/09/2022   ANIONGAP 8 10/09/2022   EGFR 74 08/09/2022   Lab Results  Component Value Date   CHOL 143 02/13/2022   Lab Results  Component Value Date   HDL 42 02/13/2022   Lab Results  Component Value Date   LDLCALC 85 02/13/2022   Lab Results  Component Value Date   TRIG 85 02/13/2022   Lab Results  Component Value Date   CHOLHDL 3.4 02/13/2022   Lab Results  Component Value Date   HGBA1C 7.5 (H) 08/09/2022      Assessment & Plan:   Problem List Items Addressed This Visit       Cardiovascular and Mediastinum   Essential hypertension    BP Readings from Last 1 Encounters:  10/18/22 136/66  Well-controlled with Losartan and Chlorthalidone now Needs to take chlorthalidone every other day instead of daily and improved fluid intake Counseled for compliance with the medications Advised DASH diet and moderate exercise/walking, at least 150 mins/week      Typical atrial flutter ([place])    During hospitalization in 2020 Was cardioverted On Eliquis Had bradycardia with Cardizem Followed by Cardiology        Endocrine   Type 2 diabetes mellitus with other specified complication ([place]) - Primary    Lab Results  Component Value Date   HGBA1C 7.5 (H) 08/09/2022  Uncontrolled, but improving On  Levemir 25 U qHS and Janumet BID Offered Levemir flexpen, but patient prefers to inject insulin with syringe Started glipizide 5 mg BID Decreased dose of Levemir to 10 U qHS Would prefer to start GLP 1 agonist, but has bloating currently - advised diet modification for now Advised to follow diabetic diet On ACEi and statin now F/u CMP and lipid panel Diabetic eye exam: Advised to follow up with Ophthalmology for diabetic eye exam      Relevant Medications   glipiZIDE (GLUCOTROL) 5 MG tablet   insulin detemir (LEVEMIR) 100 UNIT/ML injection     Other   Mixed hyperlipidemia    Check lipid profile On statin      Crushing injury of left thumb    Currently healing well If persistent concern of ingrown fingernail, will refer to Hand surgeon Used to be followed by Orthopedic surgery      Bloating    Since cholecystectomy Advised to follow low cholesterol diet-avoid oily and  fried food Keep food diary Gas-X/simethicone as needed      Meds ordered this encounter  Medications   glipiZIDE (GLUCOTROL) 5 MG tablet    Sig: Take 1 tablet (5 mg total) by mouth 2 (two) times daily before a meal.    Dispense:  60 tablet    Refill:  3   insulin detemir (LEVEMIR) 100 UNIT/ML injection    Sig: INJECT 10 UNITS SUBCUTANEOUSLY DAILY    Dispense:  10 mL    Refill:  3    Follow-up: Return in about 2 months (around 12/18/2022) for DM.    Lindell Spar, MD

## 2022-10-18 NOTE — Assessment & Plan Note (Addendum)
Check lipid profile On statin

## 2022-10-18 NOTE — Assessment & Plan Note (Addendum)
Lab Results  Component Value Date   HGBA1C 7.5 (H) 08/09/2022   Uncontrolled, but improving On Levemir 25 U qHS and Janumet BID Offered Levemir flexpen, but patient prefers to inject insulin with syringe Started glipizide 5 mg BID Decreased dose of Levemir to 10 U qHS Would prefer to start GLP 1 agonist, but has bloating currently - advised diet modification for now Advised to follow diabetic diet On ACEi and statin now F/u CMP and lipid panel Diabetic eye exam: Advised to follow up with Ophthalmology for diabetic eye exam

## 2022-10-18 NOTE — Assessment & Plan Note (Signed)
Since cholecystectomy Advised to follow low cholesterol diet-avoid oily and fried food Keep food diary Gas-X/simethicone as needed

## 2022-10-18 NOTE — Assessment & Plan Note (Signed)
Currently healing well If persistent concern of ingrown fingernail, will refer to Hand surgeon Used to be followed by Orthopedic surgery

## 2022-10-18 NOTE — Patient Instructions (Signed)
Please start taking Glipizide twice daily.  Please take Levemir only 10 units at bedtime.  Please continue checking blood glucose and bring meter and log in the next visit.  Please take Chlorthalidone every other day and maintain at least 64 ounces of fluid intake.  Okay to take GasX for bloating. Please avoid oily and fried food.  Please continue to follow low carb diet and ambulate as tolerated.

## 2022-10-18 NOTE — Assessment & Plan Note (Addendum)
BP Readings from Last 1 Encounters:  10/18/22 136/66   Well-controlled with Losartan and Chlorthalidone now Needs to take chlorthalidone every other day instead of daily and improved fluid intake Counseled for compliance with the medications Advised DASH diet and moderate exercise/walking, at least 150 mins/week

## 2022-10-18 NOTE — Assessment & Plan Note (Signed)
During hospitalization in 2020 Was cardioverted On Eliquis Had bradycardia with Cardizem Followed by Cardiology 

## 2022-12-03 ENCOUNTER — Telehealth: Payer: Self-pay | Admitting: Internal Medicine

## 2022-12-03 ENCOUNTER — Other Ambulatory Visit: Payer: Self-pay | Admitting: Internal Medicine

## 2022-12-03 DIAGNOSIS — E1165 Type 2 diabetes mellitus with hyperglycemia: Secondary | ICD-10-CM

## 2022-12-03 NOTE — Telephone Encounter (Signed)
Need refill BD INSULIN SYRINGE U/F 31G X 5/16" 1 ML MISC [[id number]]   Test trips also  Pharmacy: [facility]

## 2022-12-03 NOTE — Telephone Encounter (Signed)
Refill sent to pharmacy.   

## 2022-12-18 ENCOUNTER — Ambulatory Visit (INDEPENDENT_AMBULATORY_CARE_PROVIDER_SITE_OTHER): Payer: Medicaid Other | Admitting: Internal Medicine

## 2022-12-18 ENCOUNTER — Encounter: Payer: Self-pay | Admitting: Internal Medicine

## 2022-12-18 VITALS — BP 137/69 | HR 67 | Ht 71.0 in | Wt 229.2 lb

## 2022-12-18 DIAGNOSIS — I483 Typical atrial flutter: Secondary | ICD-10-CM

## 2022-12-18 DIAGNOSIS — I10 Essential (primary) hypertension: Secondary | ICD-10-CM

## 2022-12-18 DIAGNOSIS — E1169 Type 2 diabetes mellitus with other specified complication: Secondary | ICD-10-CM

## 2022-12-18 DIAGNOSIS — Z7901 Long term (current) use of anticoagulants: Secondary | ICD-10-CM

## 2022-12-18 DIAGNOSIS — Z794 Long term (current) use of insulin: Secondary | ICD-10-CM

## 2022-12-18 DIAGNOSIS — E782 Mixed hyperlipidemia: Secondary | ICD-10-CM | POA: Diagnosis not present

## 2022-12-18 MED ORDER — LOSARTAN POTASSIUM 100 MG PO TABS: 100.0000 mg | ORAL_TABLET | Freq: Every day | ORAL | 3 refills | Status: DC

## 2022-12-18 MED ORDER — GLIPIZIDE 5 MG PO TABS: 5.0000 mg | ORAL_TABLET | Freq: Two times a day (BID) | ORAL | 3 refills | Status: DC

## 2022-12-18 MED ORDER — APIXABAN 5 MG PO TABS: 5.0000 mg | ORAL_TABLET | Freq: Two times a day (BID) | ORAL | 5 refills | Status: DC

## 2022-12-18 MED ORDER — JANUMET XR 50-1000 MG PO TB24: 2.0000 | ORAL_TABLET | Freq: Every day | ORAL | 3 refills | Status: DC

## 2022-12-18 MED ORDER — AMLODIPINE BESYLATE 10 MG PO TABS: 10.0000 mg | ORAL_TABLET | Freq: Every day | ORAL | 3 refills | Status: DC

## 2022-12-18 MED ORDER — ATORVASTATIN CALCIUM 10 MG PO TABS: 10.0000 mg | ORAL_TABLET | Freq: Every day | ORAL | 3 refills | Status: DC

## 2022-12-18 NOTE — Assessment & Plan Note (Signed)
During hospitalization in 2020 Was cardioverted On Eliquis Had bradycardia with Cardizem Followed by Cardiology 

## 2022-12-18 NOTE — Assessment & Plan Note (Signed)
Lab Results  Component Value Date   HGBA1C 7.5 (H) 08/09/2022   Uncontrolled, but improving On Levemir 10 U qHS and Janumet BID On glipizide 5 mg BID DC Levemir to 10 U qHS as he has had hypoglycemia episodes at times, if needed, can adjust dose of Glipizide Would prefer to start GLP 1 agonist, but has bloating currently - advised diet modification for now Advised to follow diabetic diet On ACEi and statin now F/u CMP and lipid panel Diabetic eye exam: Advised to follow up with Ophthalmology for diabetic eye exam

## 2022-12-18 NOTE — Patient Instructions (Signed)
Please stop taking Levemir.  Please continue to take other medications as prescribed.  Please continue to follow low carb diet and perform moderate exercise/walking at least 150 mins/week.

## 2022-12-18 NOTE — Progress Notes (Signed)
Established Patient Office Visit  Subjective:  Patient ID: Jeremy Burgess, male    DOB: [birth year]  Age: 57 y.o. MRN: [id number]  CC:  Chief Complaint  Patient presents with   Diabetes    Follow up, sugar will drop when taking the glipizide and bottom out    HPI Jeremy Burgess is a 57 y.o. male with past medical history of HTN, atrial flutter, type II DM with neuropathy, chronic low back pain, chronic right ankle pain due to trauma and obesity who presents for f/u of his chronic medical conditions.  Type II DM: His HbA1c was 7.5 in 12/23.  He takes Levemir 10 U qHS, Janumet 50-1000 mg twice daily.  He was recently placed on glipizide 5 mg twice daily, but reports episodic hypoglycemia especially at the nighttime and early morning.  He denies any polyuria or polydipsia or polyphagia. He has brought his glucometer log today. His blood glucose ranges between 80-140 mostly, with some readings around 70s.  HTN: BP is well-controlled. Takes medications regularly.  He was recently placed on chlorthalidone for elevated BP by cardiology.  His serum creatinine increased after starting it, and was advised to take it every other day.  Patient denies headache, dizziness, chest pain, dyspnea or palpitations.   Past Medical History:  Diagnosis Date   Accidentally struck by falling tree 02/08/2015   Acute blood loss anemia 02/09/2015   Acute respiratory failure (HCC) 02/09/2015   Acute respiratory failure (HCC) 02/09/2015   Ankle fracture, right 02/09/2015   Arthritis    Asthma    Atrial flutter (HCC)    a. diagnosed in 08/2018 b. s/p DCCV in 09/2018 with return to NSR   Chronic pain due to trauma    Closed fracture of right ulna 02/08/2015   Closed right pilon fracture 02/23/2015   Diabetes mellitus    Diabetes mellitus (HCC)    Femur fracture, right (HCC) 02/08/2015   Heart murmur    History of blood clots    DVT   Hypertension    Injury of hand, extensor tendon, right, sequela 09/01/2018    Pneumothorax    Postlaminectomy syndrome, lumbar region 11/13/2015   Rib fracture    Thrombocytopenia (HCC) 02/09/2015   Traumatic closed displaced fracture of multiple ribs of right side 02/08/2015   Traumatic hemopneumothorax 02/09/2015   Upper GI bleed 05/26/2016   Upper GI bleeding 05/27/2016   Trivial. EGD unremarkable.    Past Surgical History:  Procedure Laterality Date   BACK SURGERY     CARDIOVERSION N/A 10/06/2018   Procedure: CARDIOVERSION;  Surgeon: Jonelle Sidle, MD;  Location: [facility];  Service: Cardiovascular;  Laterality: N/A;   CHOLECYSTECTOMY N/A 10/30/2016   Procedure: LAPAROSCOPIC CHOLECYSTECTOMY;  Surgeon: Franky Macho, MD;  Location: [facility];  Service: General;  Laterality: N/A;   COLONOSCOPY N/A 07/25/2016   Dr. Jena Gauss: 9 mm tubular adenoma. 5 year surveillance    ESOPHAGOGASTRODUODENOSCOPY  2009   EGD completed by Dr. Jena Gauss due to hematemesis. Findings of normal esophagus, tiny antral erosions, normal D1, D2   ESOPHAGOGASTRODUODENOSCOPY N/A 05/27/2016   Dr. Jena Gauss: normal    EXTERNAL FIXATION LEG Right 02/09/2015   Procedure: EXTERNAL FIXATION LEG;  Surgeon: Sheral Apley, MD;  Location: [facility];  Service: Orthopedics;  Laterality: Right;   EXTERNAL FIXATION REMOVAL Right 02/14/2015   Procedure: REMOVAL EXTERNAL FIXATION LEG;  Surgeon: Sheral Apley, MD;  Location: [facility] OR;  Service: Orthopedics;  Laterality: Right;   FEMUR IM NAIL  Right 02/09/2015   Procedure: INTRAMEDULLARY (IM) RETROGRADE FEMORAL NAILING;  Surgeon: Sheral Apley, MD;  Location: [facility] OR;  Service: Orthopedics;  Laterality: Right;   NOSE SURGERY     ORIF ANKLE FRACTURE Right 02/14/2015   Procedure: OPEN REDUCTION INTERNAL FIXATION (ORIF) PILON FRACTURE;  Surgeon: Sheral Apley, MD;  Location: [facility] OR;  Service: Orthopedics;  Laterality: Right;   ORIF ULNAR FRACTURE Right 02/09/2015   Procedure: OPEN REDUCTION INTERNAL FIXATION (ORIF) ULNAR FRACTURE;  Surgeon: Sheral Apley, MD;  Location: [facility] OR;   Service: Orthopedics;  Laterality: Right;   POLYPECTOMY  07/25/2016   Procedure: POLYPECTOMY;  Surgeon: Corbin Ade, MD;  Location: [facility];  Service: Endoscopy;;  colon    Family History  Problem Relation Age of Onset   Diabetes Mother    COPD Mother    CAD Father        CABG in his 85's   Diabetes Sister    Atrial fibrillation Brother    Colon cancer Neg Hx     Social History   Socioeconomic History   Marital status: Single    Spouse name: Not on file   Number of children: Not on file   Years of education: Not on file   Highest education level: Not on file  Occupational History   Not on file  Tobacco Use   Smoking status: Never   Smokeless tobacco: Never  Vaping Use   Vaping Use: Never used  Substance and Sexual Activity   Alcohol use: No    Comment: history of alcohol abuse but quit in 2008    Drug use: No   Sexual activity: Yes  Other Topics Concern   Not on file  Social History Narrative       Single.Lives alone.On disability secondary to tree fall accident 2016.   Social Determinants of Health   Financial Resource Strain: Medium Risk (06/27/2020)   Overall Financial Resource Strain (CARDIA)    Difficulty of Paying Living Expenses: Somewhat hard  Food Insecurity: Not on file  Transportation Needs: No Transportation Needs (08/30/2022)   PRAPARE - Administrator, Civil Service (Medical): No    Lack of Transportation (Non-Medical): No  Physical Activity: Not on file  Stress: Not on file  Social Connections: Not on file  Intimate Partner Violence: Not on file    Outpatient Medications Prior to Visit  Medication Sig Dispense Refill   ACCU-CHEK GUIDE test strip USE 1 STRIP TO CHECK GLUCOSE TWICE DAILY 50 each 0   BD INSULIN SYRINGE U/F 31G X 5/16" 1 ML MISC USE AS DIRECTED 100 each 0   chlorthalidone (HYGROTON) 25 MG tablet Take 0.5 tablets (12.5 mg total) by mouth every other day. 22 tablet 3   Cholecalciferol (VITAMIN D-3) 125 MCG  (5000 UT) TABS Take 15,000 mg by mouth once a week.     lidocaine (LIDODERM) 5 % Place 1 patch onto the skin daily. Remove & Discard patch within 12 hours or as directed by MD 30 patch 0   magnesium oxide (MAG-OX) 400 MG tablet Take 1 tablet (400 mg total) by mouth 2 (two) times daily for 5 days, THEN 1 tablet (400 mg total) daily. 90 tablet 3   oxyCODONE-acetaminophen (PERCOCET/ROXICET) 5-325 MG tablet Take 1 tablet by mouth every 8 (eight) hours as needed. 15 tablet 0   amLODipine (NORVASC) 10 MG tablet Take 1 tablet (10 mg total) by mouth daily. 90 tablet 3   apixaban (ELIQUIS) 5  MG TABS tablet Take 1 tablet (5 mg total) by mouth 2 (two) times daily. 60 tablet 5   atorvastatin (LIPITOR) 10 MG tablet Take 1 tablet (10 mg total) by mouth daily. 90 tablet 3   glipiZIDE (GLUCOTROL) 5 MG tablet Take 1 tablet (5 mg total) by mouth 2 (two) times daily before a meal. 60 tablet 3   insulin detemir (LEVEMIR) 100 UNIT/ML injection INJECT 10 UNITS SUBCUTANEOUSLY DAILY 10 mL 3   JANUMET XR 50-1000 MG TB24 Take 2 tablets by mouth daily. 180 tablet 3   losartan (COZAAR) 100 MG tablet Take 1 tablet (100 mg total) by mouth daily. 90 tablet 3   No facility-administered medications prior to visit.    Allergies  Allergen Reactions   Bee Venom Anaphylaxis    HONEY BEES   Lisinopril Cough    ROS Review of Systems  Constitutional:  Negative for chills and fever.  HENT:  Negative for congestion and sore throat.   Eyes:  Negative for pain and discharge.  Respiratory:  Negative for cough and shortness of breath.   Cardiovascular:  Negative for chest pain and palpitations.  Gastrointestinal:  Negative for constipation, diarrhea, nausea and vomiting.  Endocrine: Negative for polydipsia and polyuria.  Genitourinary:  Negative for dysuria and hematuria.  Musculoskeletal:  Positive for arthralgias and back pain. Negative for neck pain and neck stiffness.  Skin:  Positive for wound. Negative for rash.   Neurological:  Positive for numbness. Negative for dizziness, weakness and headaches.  Psychiatric/Behavioral:  Negative for agitation and behavioral problems.       Objective:    Physical Exam Vitals reviewed.  Constitutional:      General: He is not in acute distress.    Appearance: He is obese. He is not diaphoretic.  HENT:     Head: Normocephalic and atraumatic.     Nose: Nose normal.     Mouth/Throat:     Mouth: Mucous membranes are moist.  Eyes:     General: No scleral icterus.    Extraocular Movements: Extraocular movements intact.  Cardiovascular:     Rate and Rhythm: Normal rate and regular rhythm.     Pulses: Normal pulses.     Heart sounds: Normal heart sounds. No murmur heard. Pulmonary:     Breath sounds: Normal breath sounds. No wheezing or rales.  Abdominal:     Palpations: Abdomen is soft.     Tenderness: There is no abdominal tenderness. There is no right CVA tenderness or left CVA tenderness.  Musculoskeletal:     Cervical back: Neck supple. No tenderness.     Right lower leg: No edema.     Left lower leg: No edema.     Comments: Crush injury of left thumb, no active bleeding or discharge  Skin:    General: Skin is warm.     Findings: No rash.  Neurological:     General: No focal deficit present.     Mental Status: He is alert and oriented to person, place, and time.     Sensory: Sensory deficit present.     Motor: No weakness.  Psychiatric:        Mood and Affect: Mood normal.        Behavior: Behavior normal.     BP 137/69   Pulse 67   Ht 5\' 11"  (1.803 m)   Wt 229 lb 3.2 oz (104 kg)   SpO2 96%   BMI 31.97 kg/m  Wt Readings from Last 3  Encounters:  12/18/22 229 lb 3.2 oz (104 kg)  10/18/22 233 lb (105.7 kg)  09/25/22 237 lb 3.2 oz (107.6 kg)    Lab Results  Component Value Date   TSH 2.720 02/13/2022   Lab Results  Component Value Date   WBC 9.3 08/09/2022   HGB 13.2 08/09/2022   HCT 39.6 08/09/2022   MCV 85 08/09/2022   PLT  214 08/09/2022   Lab Results  Component Value Date   NA 136 10/09/2022   K 4.5 10/09/2022   CO2 22 10/09/2022   GLUCOSE 131 (H) 10/09/2022   BUN 25 (H) 10/09/2022   CREATININE 1.50 (H) 10/09/2022   BILITOT 0.3 08/09/2022   ALKPHOS 67 08/09/2022   AST 19 08/09/2022   ALT 15 08/09/2022   PROT 7.3 08/09/2022   ALBUMIN 4.5 08/09/2022   CALCIUM 8.8 (L) 10/09/2022   ANIONGAP 8 10/09/2022   EGFR 74 08/09/2022   Lab Results  Component Value Date   CHOL 143 02/13/2022   Lab Results  Component Value Date   HDL 42 02/13/2022   Lab Results  Component Value Date   LDLCALC 85 02/13/2022   Lab Results  Component Value Date   TRIG 85 02/13/2022   Lab Results  Component Value Date   CHOLHDL 3.4 02/13/2022   Lab Results  Component Value Date   HGBA1C 7.5 (H) 08/09/2022      Assessment & Plan:   Problem List Items Addressed This Visit       Cardiovascular and Mediastinum   Essential hypertension    BP Readings from Last 1 Encounters:  12/18/22 137/69  Well-controlled with Losartan and Chlorthalidone now Needs to take chlorthalidone every other day Counseled for compliance with the medications Advised DASH diet and moderate exercise/walking, at least 150 mins/week      Relevant Medications   amLODipine (NORVASC) 10 MG tablet   atorvastatin (LIPITOR) 10 MG tablet   losartan (COZAAR) 100 MG tablet   apixaban (ELIQUIS) 5 MG TABS tablet   Typical atrial flutter (HCC)    During hospitalization in 2020 Was cardioverted On Eliquis Had bradycardia with Cardizem Followed by Cardiology      Relevant Medications   amLODipine (NORVASC) 10 MG tablet   atorvastatin (LIPITOR) 10 MG tablet   losartan (COZAAR) 100 MG tablet   apixaban (ELIQUIS) 5 MG TABS tablet     Endocrine   Type 2 diabetes mellitus with other specified complication (HCC) - Primary    Lab Results  Component Value Date   HGBA1C 7.5 (H) 08/09/2022  Uncontrolled, but improving On Levemir 10 U qHS and  Janumet BID On glipizide 5 mg BID DC Levemir to 10 U qHS as he has had hypoglycemia episodes at times, if needed, can adjust dose of Glipizide Would prefer to start GLP 1 agonist, but has bloating currently - advised diet modification for now Advised to follow diabetic diet On ACEi and statin now F/u CMP and lipid panel Diabetic eye exam: Advised to follow up with Ophthalmology for diabetic eye exam      Relevant Medications   atorvastatin (LIPITOR) 10 MG tablet   glipiZIDE (GLUCOTROL) 5 MG tablet   JANUMET XR 50-1000 MG TB24   losartan (COZAAR) 100 MG tablet   Other Relevant Orders   CMP14+EGFR   Hemoglobin A1c   Ambulatory referral to Optometry     Other   Mixed hyperlipidemia   Relevant Medications   amLODipine (NORVASC) 10 MG tablet   atorvastatin (LIPITOR) 10 MG  tablet   losartan (COZAAR) 100 MG tablet   apixaban (ELIQUIS) 5 MG TABS tablet   Other Relevant Orders   Lipid Profile   Other Visit Diagnoses     Chronic anticoagulation       Relevant Orders   CBC      Meds ordered this encounter  Medications   amLODipine (NORVASC) 10 MG tablet    Sig: Take 1 tablet (10 mg total) by mouth daily.    Dispense:  90 tablet    Refill:  3   atorvastatin (LIPITOR) 10 MG tablet    Sig: Take 1 tablet (10 mg total) by mouth daily.    Dispense:  90 tablet    Refill:  3   glipiZIDE (GLUCOTROL) 5 MG tablet    Sig: Take 1 tablet (5 mg total) by mouth 2 (two) times daily before a meal.    Dispense:  60 tablet    Refill:  3   JANUMET XR 50-1000 MG TB24    Sig: Take 2 tablets by mouth daily.    Dispense:  180 tablet    Refill:  3   losartan (COZAAR) 100 MG tablet    Sig: Take 1 tablet (100 mg total) by mouth daily.    Dispense:  90 tablet    Refill:  3   apixaban (ELIQUIS) 5 MG TABS tablet    Sig: Take 1 tablet (5 mg total) by mouth 2 (two) times daily.    Dispense:  60 tablet    Refill:  5    Follow-up: Return in about 3 months (around 03/20/2023) for Annual physical.     [name], MD

## 2022-12-18 NOTE — Assessment & Plan Note (Signed)
BP Readings from Last 1 Encounters:  12/18/22 137/69   Well-controlled with Losartan and Chlorthalidone now Needs to take chlorthalidone every other day Counseled for compliance with the medications Advised DASH diet and moderate exercise/walking, at least 150 mins/week

## 2022-12-19 ENCOUNTER — Other Ambulatory Visit: Payer: Self-pay | Admitting: Internal Medicine

## 2022-12-19 DIAGNOSIS — E1165 Type 2 diabetes mellitus with hyperglycemia: Secondary | ICD-10-CM

## 2022-12-19 LAB — LIPID PANEL
Chol/HDL Ratio: 2.6 ratio (ref 0.0–5.0)
Cholesterol, Total: 101 mg/dL (ref 100–199)
HDL: 39 mg/dL — ABNORMAL LOW (ref >39)
LDL Chol Calc (NIH): 47 mg/dL (ref 0–99)
Triglycerides: 67 mg/dL (ref 0–149)
VLDL Cholesterol Cal: 15 mg/dL (ref 5–40)

## 2022-12-19 LAB — CBC
Hematocrit: 39.9 % (ref 37.5–51.0)
Hemoglobin: 12.9 g/dL — ABNORMAL LOW (ref 13.0–17.7)
MCH: 28 pg (ref 26.6–33.0)
MCHC: 32.3 g/dL (ref 31.5–35.7)
MCV: 87 fL (ref 79–97)
Platelets: 253 10*3/uL (ref 150–450)
RBC: 4.6 x10E6/uL (ref 4.14–5.80)
RDW: 13.4 % (ref 11.6–15.4)
WBC: 9.4 10*3/uL (ref 3.4–10.8)

## 2022-12-19 LAB — CMP14+EGFR
ALT: 17 IU/L (ref 0–44)
AST: 22 IU/L (ref 0–40)
Albumin/Globulin Ratio: 1.5 (ref 1.2–2.2)
Albumin: 4.3 g/dL (ref 3.8–4.9)
Alkaline Phosphatase: 67 IU/L (ref 44–121)
BUN/Creatinine Ratio: 15 (ref 9–20)
BUN: 22 mg/dL (ref 6–24)
Bilirubin Total: 0.4 mg/dL (ref 0.0–1.2)
CO2: 20 mmol/L (ref 20–29)
Calcium: 9.7 mg/dL (ref 8.7–10.2)
Chloride: 103 mmol/L (ref 96–106)
Creatinine, Ser: 1.42 mg/dL — ABNORMAL HIGH (ref 0.76–1.27)
Globulin, Total: 2.9 g/dL (ref 1.5–4.5)
Glucose: 100 mg/dL — ABNORMAL HIGH (ref 70–99)
Potassium: 5 mmol/L (ref 3.5–5.2)
Sodium: 141 mmol/L (ref 134–144)
Total Protein: 7.2 g/dL (ref 6.0–8.5)
eGFR: 57 mL/min/{1.73_m2} — ABNORMAL LOW (ref >59)

## 2022-12-19 LAB — HEMOGLOBIN A1C
Est. average glucose Bld gHb Est-mCnc: 151 mg/dL
Hgb A1c MFr Bld: 6.9 % — ABNORMAL HIGH (ref 4.8–5.6)

## 2023-01-07 ENCOUNTER — Other Ambulatory Visit: Payer: Self-pay | Admitting: Internal Medicine

## 2023-01-07 DIAGNOSIS — E1165 Type 2 diabetes mellitus with hyperglycemia: Secondary | ICD-10-CM

## 2023-01-13 ENCOUNTER — Telehealth: Payer: Self-pay | Admitting: Cardiology

## 2023-01-13 NOTE — Telephone Encounter (Signed)
   Pre-operative Risk Assessment    Patient Name: Jeremy Burgess  DOB: [birth year] MRN: [id number]     Request for Surgical Clearance    Procedure:   Possible treatments: cleaning (simple or deep), radiographs, fillings, crowns, bridges, extraction (simple or surgical), local anesthetic (with epinephrine)  Date of Surgery:  Clearance TBD                                 Surgeon:  Guadelupe Sabin, DDS, MHA Surgeon's Group or Practice Name:  [facility] Phone number:  [phone] Fax number:  [phone]   Type of Clearance Requested:   - Medical  - Pharmacy:  Hold Pt takes a blood thinner      Type of Anesthesia:  Not Indicated   Additional requests/questions:    Signed, Seymour Bars   01/13/2023, 10:45 AM

## 2023-01-13 NOTE — Telephone Encounter (Signed)
   Patient Name: Jeremy Burgess  DOB: 19-Nov-1965 MRN: [id number]  Primary Cardiologist: Dina Rich, MD  Chart reviewed as part of pre-operative protocol coverage.   Dental extractions of 1-2 teeth are considered low risk procedures per guidelines and generally do not require any specific cardiac clearance. It is also generally accepted that for extractions of 1-2 teeth and dental cleanings, there is no need to interrupt blood thinner therapy.  SBE prophylaxis is not required for the patient from a cardiac standpoint.  I will route this recommendation to the requesting party via Epic fax function and remove from pre-op pool.  Please call with questions.  Carlos Levering, NP 01/13/2023, 4:02 PM

## 2023-01-13 NOTE — Telephone Encounter (Signed)
Pre-op team,  Please clarify with dental practice if they are requesting recommendations for a specific procedure. If dental extractions are being performed we will need the specific number of teeth being extracted.   Thank you!  DW

## 2023-01-13 NOTE — Telephone Encounter (Signed)
Spoke with Inetta Fermo from requesting office and she stated that patient is scheduled to have 2 extractions on September 4th

## 2023-01-20 ENCOUNTER — Other Ambulatory Visit: Payer: Self-pay

## 2023-01-20 ENCOUNTER — Emergency Department (HOSPITAL_COMMUNITY): Payer: Medicaid Other

## 2023-01-20 ENCOUNTER — Emergency Department (HOSPITAL_COMMUNITY)
Admission: EM | Admit: 2023-01-20 | Discharge: 2023-01-20 | Disposition: A | Discharge status: Home / Self Care | Payer: Medicaid Other | Attending: Emergency Medicine | Admitting: Emergency Medicine

## 2023-01-20 DIAGNOSIS — R519 Headache, unspecified: Secondary | ICD-10-CM | POA: Diagnosis not present

## 2023-01-20 DIAGNOSIS — Z7901 Long term (current) use of anticoagulants: Secondary | ICD-10-CM | POA: Insufficient documentation

## 2023-01-20 DIAGNOSIS — R42 Dizziness and giddiness: Secondary | ICD-10-CM | POA: Diagnosis not present

## 2023-01-20 DIAGNOSIS — R0600 Dyspnea, unspecified: Secondary | ICD-10-CM | POA: Diagnosis not present

## 2023-01-20 LAB — CBC
HCT: 36.4 % — ABNORMAL LOW (ref 39.0–52.0)
Hemoglobin: 12.1 g/dL — ABNORMAL LOW (ref 13.0–17.0)
MCH: 28.6 pg (ref 26.0–34.0)
MCHC: 33.2 g/dL (ref 30.0–36.0)
MCV: 86.1 fL (ref 80.0–100.0)
Platelets: 214 10*3/uL (ref 150–400)
RBC: 4.23 MIL/uL (ref 4.22–5.81)
RDW: 13.3 % (ref 11.5–15.5)
WBC: 7.7 10*3/uL (ref 4.0–10.5)
nRBC: 0 % (ref 0.0–0.2)

## 2023-01-20 LAB — TROPONIN I (HIGH SENSITIVITY): Troponin I (High Sensitivity): 4 ng/L (ref <18)

## 2023-01-20 LAB — BASIC METABOLIC PANEL
Anion gap: 7 (ref 5–15)
BUN: 16 mg/dL (ref 6–20)
CO2: 24 mmol/L (ref 22–32)
Calcium: 8.5 mg/dL — ABNORMAL LOW (ref 8.9–10.3)
Chloride: 103 mmol/L (ref 98–111)
Creatinine, Ser: 1.15 mg/dL (ref 0.61–1.24)
GFR, Estimated: >60 mL/min (ref >60)
Glucose, Bld: 117 mg/dL — ABNORMAL HIGH (ref 70–99)
Potassium: 4 mmol/L (ref 3.5–5.1)
Sodium: 134 mmol/L — ABNORMAL LOW (ref 135–145)

## 2023-01-20 LAB — URINALYSIS, ROUTINE W REFLEX MICROSCOPIC
Bilirubin Urine: NEGATIVE
Glucose, UA: NEGATIVE mg/dL
Hgb urine dipstick: NEGATIVE
Ketones, ur: NEGATIVE mg/dL
Leukocytes,Ua: NEGATIVE
Nitrite: NEGATIVE
Protein, ur: NEGATIVE mg/dL
Specific Gravity, Urine: 1.005 (ref 1.005–1.030)
pH: 6 (ref 5.0–8.0)

## 2023-01-20 LAB — CBG MONITORING, ED: Glucose-Capillary: 114 mg/dL — ABNORMAL HIGH (ref 70–99)

## 2023-01-20 MED ORDER — LACTATED RINGERS IV BOLUS
1000.0000 mL | Freq: Once | INTRAVENOUS | Status: AC
  Administered 2023-01-20: 1000 mL via INTRAVENOUS

## 2023-01-20 MED ORDER — ACETAMINOPHEN 325 MG PO TABS
650.0000 mg | ORAL_TABLET | Freq: Once | ORAL | Status: AC
  Administered 2023-01-20: 650 mg via ORAL
  Filled 2023-01-20: qty 2

## 2023-01-20 NOTE — ED Provider Notes (Signed)
[facility] EMERGENCY DEPARTMENT AT [facility] Provider Note   CSN: [id number] Arrival date & time: 01/20/23  0930     History  Chief Complaint  Patient presents with   Dizziness    Jeremy Burgess is a 57 y.o. male.  HPI 57 year old male presents with a chief complaints of lightheadedness and headache. Symptoms started yesterday. Has gradually worsening pressure to the back of his head. Feels short of breath, but no chest pain. Lightheadedness seems to happen with movement. No focal weakness. He's had this before and associated it with BP changes. Last week it happened and his BP was 100 systolic. Other times it's been high or fluctuating. He's on multiple BP meds (which he's been compliant with) as well as Eliquis.   Home Medications Prior to Admission medications   Medication Sig Start Date End Date Taking? Authorizing Provider  amLODipine (NORVASC) 10 MG tablet Take 1 tablet (10 mg total) by mouth daily. 12/18/22  Yes [name], MD  apixaban (ELIQUIS) 5 MG TABS tablet Take 1 tablet (5 mg total) by mouth 2 (two) times daily. 12/18/22  Yes [name], MD  atorvastatin (LIPITOR) 10 MG tablet Take 1 tablet (10 mg total) by mouth daily. 12/18/22  Yes [name], MD  chlorhexidine (PERIDEX) 0.12 % solution Use as directed 15 mLs in the mouth or throat 2 (two) times daily. 01/13/23  Yes [provider]  chlorthalidone (HYGROTON) 25 MG tablet Take 0.5 tablets (12.5 mg total) by mouth every other day. 10/15/22 10/10/23 Yes BranchDorothe Pea, MD  Cholecalciferol (VITAMIN D-3) 125 MCG (5000 UT) TABS Take 15,000 mg by mouth once a week.   Yes [provider]  glipiZIDE (GLUCOTROL) 5 MG tablet Take 1 tablet (5 mg total) by mouth 2 (two) times daily before a meal. 12/18/22  Yes Patel, Earlie Lou, MD  JANUMET XR 50-1000 MG TB24 Take 2 tablets by mouth daily. 12/18/22  Yes [name], MD  losartan (COZAAR) 100 MG tablet Take 1 tablet (100 mg total) by mouth daily.  12/18/22  Yes [name], MD  magnesium oxide (MAG-OX) 400 MG tablet Take 1 tablet (400 mg total) by mouth 2 (two) times daily for 5 days, THEN 1 tablet (400 mg total) daily. Patient taking differently: 400mg  QD 10/15/22 10/15/23 Yes Branch, Dorothe Pea, MD  ACCU-CHEK GUIDE test strip USE 1 STRIP TO CHECK GLUCOSE TWICE DAILY 01/08/23   [name], MD  BD INSULIN SYRINGE U/F 31G X 5/16" 1 ML MISC USE AS DIRECTED 12/03/22   [name], MD      Allergies    Bee venom and Lisinopril    Review of Systems   Review of Systems  Constitutional:  Positive for fatigue.  Respiratory:  Positive for shortness of breath.   Cardiovascular:  Negative for chest pain and leg swelling.  Neurological:  Positive for light-headedness and headaches. Negative for syncope.    Physical Exam Updated Vital Signs BP 135/66   Pulse (!) 55   Temp 97.9 F (36.6 C) (Oral)   Resp 15   Ht 5\' 11"  (1.803 m)   Wt 108.9 kg   SpO2 99%   BMI 33.47 kg/m  Physical Exam Vitals and nursing note reviewed.  Constitutional:      General: He is not in acute distress.    Appearance: He is well-developed. He is not ill-appearing or diaphoretic.  HENT:     Head: Normocephalic and atraumatic.  Eyes:     Extraocular Movements: Extraocular movements intact.     Pupils: Pupils are equal, round, and reactive to light.  Cardiovascular:     Rate and Rhythm: Normal rate and regular rhythm.     Heart sounds: Normal heart sounds.  Pulmonary:     Effort: Pulmonary effort is normal.     Breath sounds: Normal breath sounds.  Abdominal:     Palpations: Abdomen is soft.     Tenderness: There is no abdominal tenderness.  Musculoskeletal:     Cervical back: No rigidity.  Skin:    General: Skin is warm and dry.  Neurological:     Mental Status: He is alert.     Comments: CN 3-12 grossly intact. 5/5 strength in all 4 extremities. Grossly normal sensation. Normal finger to nose. Normal gait     ED Results / Procedures /  Treatments   Labs (all labs ordered are listed, but only abnormal results are displayed) Labs Reviewed  BASIC METABOLIC PANEL - Abnormal; Notable for the following components:      Result Value   Sodium 134 (*)    Glucose, Bld 117 (*)    Calcium 8.5 (*)    All other components within normal limits  CBC - Abnormal; Notable for the following components:   Hemoglobin 12.1 (*)    HCT 36.4 (*)    All other components within normal limits  URINALYSIS, ROUTINE W REFLEX MICROSCOPIC - Abnormal; Notable for the following components:   Color, Urine COLORLESS (*)    All other components within normal limits  CBG MONITORING, ED - Abnormal; Notable for the following components:   Glucose-Capillary 114 (*)    All other components within normal limits  TROPONIN I (HIGH SENSITIVITY)    EKG EKG Interpretation  Date/Time:  Monday January 20 2023 10:05:45 EDT Ventricular Rate:  64 PR Interval:  152 QRS Duration: 90 QT Interval:  385 QTC Calculation: 398 R Axis:   63 Text Interpretation: Sinus rhythm no acute ST/T changes Confirmed by Pricilla Loveless [phone]) on 01/20/2023 10:08:50 AM  Radiology CT Head Wo Contrast  Result Date: 01/20/2023 CLINICAL DATA:  Headache, new onset. Dizziness and shortness of breath since yesterday. EXAM: CT HEAD WITHOUT CONTRAST TECHNIQUE: Contiguous axial images were obtained from the base of the skull through the vertex without intravenous contrast. RADIATION DOSE REDUCTION: This exam was performed according to the departmental dose-optimization program which includes automated exposure control, adjustment of the mA and/or kV according to patient size and/or use of iterative reconstruction technique. COMPARISON:  None Available. FINDINGS: Brain: No evidence of acute infarction, hemorrhage, hydrocephalus, extra-axial collection or mass lesion/mass effect. Vascular: No hyperdense vessel or unexpected calcification. Skull: Normal. Negative for fracture or focal lesion.  Sinuses/Orbits: No acute finding. IMPRESSION: Normal head CT. Electronically Signed   By: Tiburcio Pea M.D.   On: 01/20/2023 13:00   DG Chest 2 View  Result Date: 01/20/2023 CLINICAL DATA:  Dyspnea, lightheaded EXAM: CHEST - 2 VIEW COMPARISON:  08/31/2018 FINDINGS: Mild cardiomegaly. No focal pulmonary opacity. No pleural effusion or pneumothorax. No acute osseous abnormality. IMPRESSION: Mild cardiomegaly. No acute cardiopulmonary process. Electronically Signed   By: Wiliam Ke M.D.   On: 01/20/2023 12:09    Procedures Procedures    Medications Ordered in ED Medications  acetaminophen (TYLENOL) tablet 650 mg (650 mg Oral Given 01/20/23 1121)  lactated ringers bolus 1,000 mL (0 mLs Intravenous Stopped 01/20/23 1405)    ED Course/ Medical Decision Making/ A&P  Medical Decision Making Amount and/or Complexity of Data Reviewed Labs: ordered.    Details: No significant electrolyte disturbance.  Chronic anemia.  Normal troponin. Radiology: ordered and independent interpretation performed.    Details: No head bleed.  No CHF. ECG/medicine tests: ordered and independent interpretation performed.    Details: No ischemia.  Risk OTC drugs.   Patient with waxing and waning dizziness that sounds like lightheadedness.  Able to ambulate without difficulty and has a benign neuroexam as I think stroke is highly unlikely.  He does have an on and off headache and is on Xarelto so a CT head was obtained but this is negative.  I doubt PE.  No tachycardia and he has been on anticoagulation.  At this point, he feels well and feels better after some fluids.  He is not hypertensive and now his blood pressure is actually normal.  I think is reasonable to have him follow-up with PCP for outpatient blood pressure control.  Will discharge home with return precautions.        Final Clinical Impression(s) / ED Diagnoses Final diagnoses:  Dizziness    Rx / DC Orders ED  Discharge Orders     None         Pricilla Loveless, MD 01/20/23 1513

## 2023-01-20 NOTE — Discharge Instructions (Signed)
Call your doctor to discuss blood pressure medicine management.  Otherwise be sure you are drinking appropriate amount of fluids.  If he develop new or worsening headache, chest pain, shortness of breath, dizziness, weakness, numbness, or any other new/concerning symptoms then return to the ER or call 911.

## 2023-01-20 NOTE — ED Triage Notes (Signed)
Pt c/o dizziness, headache, SOB, since yesterday. Pt believes his bp is going up and down because these are the same symptoms that happened last time his bp was out of control .

## 2023-02-02 ENCOUNTER — Encounter (HOSPITAL_COMMUNITY): Payer: Self-pay | Admitting: Emergency Medicine

## 2023-02-02 ENCOUNTER — Emergency Department (HOSPITAL_COMMUNITY)
Admission: EM | Admit: 2023-02-02 | Discharge: 2023-02-03 | Disposition: A | Discharge status: Home / Self Care | Payer: Medicaid Other | Attending: Emergency Medicine | Admitting: Emergency Medicine

## 2023-02-02 ENCOUNTER — Other Ambulatory Visit: Payer: Self-pay

## 2023-02-02 ENCOUNTER — Emergency Department (HOSPITAL_COMMUNITY): Payer: Medicaid Other

## 2023-02-02 DIAGNOSIS — M79602 Pain in left arm: Secondary | ICD-10-CM | POA: Diagnosis not present

## 2023-02-02 DIAGNOSIS — M79632 Pain in left forearm: Secondary | ICD-10-CM | POA: Diagnosis not present

## 2023-02-02 DIAGNOSIS — M7989 Other specified soft tissue disorders: Secondary | ICD-10-CM | POA: Diagnosis not present

## 2023-02-02 DIAGNOSIS — Z7901 Long term (current) use of anticoagulants: Secondary | ICD-10-CM | POA: Diagnosis not present

## 2023-02-02 MED ORDER — IBUPROFEN 400 MG PO TABS
400.0000 mg | ORAL_TABLET | Freq: Once | ORAL | Status: AC
  Administered 2023-02-03: 400 mg via ORAL
  Filled 2023-02-02: qty 1

## 2023-02-02 NOTE — ED Triage Notes (Signed)
Pt with c/o pain in L arm that he states runs from his fingers to his elbow. States he has "broken that arm 3 times" and that yesterday a wrench slipped out of his hand and "jarred" it.

## 2023-02-02 NOTE — ED Provider Notes (Signed)
[place] EMERGENCY DEPARTMENT AT [facility] Provider Note   CSN: [id number] Arrival date & time: 02/02/23  2317     History {Add pertinent medical, surgical, social history, OB history to HPI:1} Chief Complaint  Patient presents with   Arm Pain    Jeremy Burgess is a 57 y.o. male.  The history is provided by the patient.   Patient reports he has had pain in the left forearm and wrist for a while.  He reports previous injuries.  Reports several days ago he jarred the left arm when he was using a wrench.  He reports he hit the arm using a wrench and has been hurting since.  He reports pain and numbness in the arm.  No numbness or weakness anywhere else in his body.    Home Medications Prior to Admission medications   Medication Sig Start Date End Date Taking? Authorizing Provider  ACCU-CHEK GUIDE test strip USE 1 STRIP TO CHECK GLUCOSE TWICE DAILY 01/08/23   [name], MD  amLODipine (NORVASC) 10 MG tablet Take 1 tablet (10 mg total) by mouth daily. 12/18/22   [name], MD  apixaban (ELIQUIS) 5 MG TABS tablet Take 1 tablet (5 mg total) by mouth 2 (two) times daily. 12/18/22   [name], MD  atorvastatin (LIPITOR) 10 MG tablet Take 1 tablet (10 mg total) by mouth daily. 12/18/22   [name], MD  BD INSULIN SYRINGE U/F 31G X 5/16" 1 ML MISC USE AS DIRECTED 12/03/22   [name], MD  chlorhexidine (PERIDEX) 0.12 % solution Use as directed 15 mLs in the mouth or throat 2 (two) times daily. 01/13/23   [provider]  chlorthalidone (HYGROTON) 25 MG tablet Take 0.5 tablets (12.5 mg total) by mouth every other day. 10/15/22 10/10/23  Antoine Poche, MD  Cholecalciferol (VITAMIN D-3) 125 MCG (5000 UT) TABS Take 15,000 mg by mouth once a week.    [provider]  glipiZIDE (GLUCOTROL) 5 MG tablet Take 1 tablet (5 mg total) by mouth 2 (two) times daily before a meal. 12/18/22   Patel, Earlie Lou, MD  JANUMET XR 50-1000 MG TB24 Take 2 tablets by  mouth daily. 12/18/22   [name], MD  losartan (COZAAR) 100 MG tablet Take 1 tablet (100 mg total) by mouth daily. 12/18/22   [name], MD  magnesium oxide (MAG-OX) 400 MG tablet Take 1 tablet (400 mg total) by mouth 2 (two) times daily for 5 days, THEN 1 tablet (400 mg total) daily. Patient taking differently: 400mg  QD 10/15/22 10/15/23  Antoine Poche, MD      Allergies    Bee venom and Lisinopril    Review of Systems   Review of Systems  Musculoskeletal:  Positive for arthralgias.    Physical Exam Updated Vital Signs BP (!) 154/75 (BP Location: Right Arm)   Pulse 70   Temp 98.9 F (37.2 C) (Oral)   Resp 16   Ht 1.803 m (5\' 11" )   Wt 109 kg   SpO2 96%   BMI 33.52 kg/m  Physical Exam CONSTITUTIONAL: Well developed/well nourished HEAD: Normocephalic/atraumatic EYES: EOMI NEURO: Pt is awake/alert/appropriate, moves all extremitiesx4.  No facial droop.  No arm or leg drift Distal radial/media/ulnar motor/sensory intact EXTREMITIES: pulses normal/equal, full ROM Tenderness noted throughout the left forearm and wrist.  Mild swelling noted.  No erythema, no deformities or bruising.  Full range of motion of left elbow.  Range  of motion of left wrist is limited due to pain. SKIN: warm, color normal PSYCH: no abnormalities of mood noted, alert and oriented to situation  ED Results / Procedures / Treatments   Labs (all labs ordered are listed, but only abnormal results are displayed) Labs Reviewed - No data to display  EKG None  Radiology No results found.  Procedures Procedures  {Document cardiac monitor, telemetry assessment procedure when appropriate:1}  Medications Ordered in ED Medications  ibuprofen (ADVIL) tablet 400 mg (has no administration in time range)    ED Course/ Medical Decision Making/ A&P   {   Click here for ABCD2, HEART and other calculatorsREFRESH Note before signing :1}                          Medical Decision Making Amount and/or  Complexity of Data Reviewed Radiology: ordered.  Risk Prescription drug management.   ***  {Document critical care time when appropriate:1} {Document review of labs and clinical decision tools ie heart score, Chads2Vasc2 etc:1}  {Document your independent review of radiology images, and any outside records:1} {Document your discussion with family members, caretakers, and with consultants:1} {Document social determinants of health affecting pt's care:1} {Document your decision making why or why not admission, treatments were needed:1} Final Clinical Impression(s) / ED Diagnoses Final diagnoses:  None    Rx / DC Orders ED Discharge Orders     None

## 2023-02-09 ENCOUNTER — Other Ambulatory Visit: Payer: Self-pay

## 2023-02-09 ENCOUNTER — Encounter (HOSPITAL_COMMUNITY): Payer: Self-pay | Admitting: Emergency Medicine

## 2023-02-09 ENCOUNTER — Emergency Department (HOSPITAL_COMMUNITY)
Admission: EM | Admit: 2023-02-09 | Discharge: 2023-02-09 | Disposition: A | Discharge status: Home / Self Care | Payer: Medicaid Other | Attending: Emergency Medicine | Admitting: Emergency Medicine

## 2023-02-09 ENCOUNTER — Emergency Department (HOSPITAL_COMMUNITY): Payer: Medicaid Other

## 2023-02-09 DIAGNOSIS — M25532 Pain in left wrist: Secondary | ICD-10-CM | POA: Diagnosis not present

## 2023-02-09 DIAGNOSIS — Z7901 Long term (current) use of anticoagulants: Secondary | ICD-10-CM | POA: Diagnosis not present

## 2023-02-09 DIAGNOSIS — M19032 Primary osteoarthritis, left wrist: Secondary | ICD-10-CM | POA: Diagnosis not present

## 2023-02-09 DIAGNOSIS — M79642 Pain in left hand: Secondary | ICD-10-CM | POA: Diagnosis not present

## 2023-02-09 DIAGNOSIS — M19042 Primary osteoarthritis, left hand: Secondary | ICD-10-CM | POA: Diagnosis not present

## 2023-02-09 DIAGNOSIS — M7989 Other specified soft tissue disorders: Secondary | ICD-10-CM | POA: Diagnosis not present

## 2023-02-09 MED ORDER — OXYCODONE-ACETAMINOPHEN 5-325 MG PO TABS
1.0000 | ORAL_TABLET | Freq: Once | ORAL | Status: AC
  Administered 2023-02-09: 1 via ORAL
  Filled 2023-02-09: qty 1

## 2023-02-09 MED ORDER — OXYCODONE-ACETAMINOPHEN 5-325 MG PO TABS: 1.0000 | ORAL_TABLET | Freq: Four times a day (QID) | ORAL | 0 refills | Status: DC | PRN

## 2023-02-09 NOTE — ED Notes (Signed)
Patient verbalizes understanding of discharge instructions. Opportunity for questioning and answers were provided. Armband removed by staff, pt discharged from ED. Ambulated out to lobby  

## 2023-02-09 NOTE — Discharge Instructions (Signed)
You were seen in the emergency department for wrist forearm and hand pain after getting struck with a wrench.  Your x-rays did not show any fracture.  Please continue to use your wrist splint and ice the area.  We are prescribing a short course of narcotic pain medication.  Follow-up with hand surgery on-call

## 2023-02-09 NOTE — ED Provider Notes (Signed)
[place] EMERGENCY DEPARTMENT AT [facility] Provider Note   CSN: [id number] Arrival date & time: 02/09/23  2146     History {Add pertinent medical, surgical, social history, OB history to HPI:1} Chief Complaint  Patient presents with   Hand Pain    Jeremy Burgess is a 57 y.o. male.  He is left-hand dominant.  He says he hit his left wrist and forearm with a wrench accidentally a few weeks ago.  Since then has had severe pain that is getting worse.  He was seen here a week ago for same and had x-rays of his forearm that were negative.  He has tried nothing for his symptoms except for a brace because he is on blood thinners and cannot take NSAIDs.  He says Tylenol does not help.  The history is provided by the patient.  Hand Pain This is a new problem. The current episode started more than 1 week ago. The problem occurs constantly. The problem has been gradually worsening. Pertinent negatives include no chest pain, no abdominal pain, no headaches and no shortness of breath. The symptoms are aggravated by bending and twisting. Nothing relieves the symptoms. He has tried nothing for the symptoms. The treatment provided no relief.       Home Medications Prior to Admission medications   Medication Sig Start Date End Date Taking? Authorizing Provider  ACCU-CHEK GUIDE test strip USE 1 STRIP TO CHECK GLUCOSE TWICE DAILY 01/08/23   [name], MD  amLODipine (NORVASC) 10 MG tablet Take 1 tablet (10 mg total) by mouth daily. 12/18/22   [name], MD  apixaban (ELIQUIS) 5 MG TABS tablet Take 1 tablet (5 mg total) by mouth 2 (two) times daily. 12/18/22   [name], MD  atorvastatin (LIPITOR) 10 MG tablet Take 1 tablet (10 mg total) by mouth daily. 12/18/22   [name], MD  BD INSULIN SYRINGE U/F 31G X 5/16" 1 ML MISC USE AS DIRECTED 12/03/22   [name], MD  chlorhexidine (PERIDEX) 0.12 % solution Use as directed 15 mLs in the mouth or throat 2 (two) times daily.  01/13/23   [provider]  chlorthalidone (HYGROTON) 25 MG tablet Take 0.5 tablets (12.5 mg total) by mouth every other day. 10/15/22 10/10/23  Antoine Poche, MD  Cholecalciferol (VITAMIN D-3) 125 MCG (5000 UT) TABS Take 15,000 mg by mouth once a week.    [provider]  glipiZIDE (GLUCOTROL) 5 MG tablet Take 1 tablet (5 mg total) by mouth 2 (two) times daily before a meal. 12/18/22   Patel, Earlie Lou, MD  JANUMET XR 50-1000 MG TB24 Take 2 tablets by mouth daily. 12/18/22   [name], MD  losartan (COZAAR) 100 MG tablet Take 1 tablet (100 mg total) by mouth daily. 12/18/22   [name], MD  magnesium oxide (MAG-OX) 400 MG tablet Take 1 tablet (400 mg total) by mouth 2 (two) times daily for 5 days, THEN 1 tablet (400 mg total) daily. Patient taking differently: 400mg  QD 10/15/22 10/15/23  Antoine Poche, MD      Allergies    Bee venom and Lisinopril    Review of Systems   Review of Systems  Constitutional:  Negative for fever.  Respiratory:  Negative for shortness of breath.   Cardiovascular:  Negative for chest pain.  Gastrointestinal:  Negative for abdominal pain.  Neurological:  Negative for headaches.    Physical Exam Updated Vital Signs BP [name])  163/74   Pulse 77   Temp 98.7 F (37.1 C) (Oral)   Resp 18   Ht 5\' 11"  (1.803 m)   Wt 108.9 kg   SpO2 98%   BMI 33.47 kg/m  Physical Exam Vitals and nursing note reviewed.  Constitutional:      Appearance: Normal appearance. He is well-developed.  HENT:     Head: Normocephalic and atraumatic.  Eyes:     Conjunctiva/sclera: Conjunctivae normal.  Pulmonary:     Effort: Pulmonary effort is normal.  Musculoskeletal:        General: Swelling and tenderness present.     Cervical back: Neck supple.     Comments: He has diffuse swelling and tenderness over his left distal forearm wrist and dorsum of hand.  There are no open wounds.  Patient has limited range of motion secondary to pain.  There is no overlying  erythema.  No fluctuance.  Radial pulse 2+  Skin:    General: Skin is warm and dry.  Neurological:     General: No focal deficit present.     Mental Status: He is alert.     GCS: GCS eye subscore is 4. GCS verbal subscore is 5. GCS motor subscore is 6.     Sensory: No sensory deficit.     Motor: No weakness.     ED Results / Procedures / Treatments   Labs (all labs ordered are listed, but only abnormal results are displayed) Labs Reviewed - No data to display  EKG None  Radiology No results found.  Procedures Procedures  {Document cardiac monitor, telemetry assessment procedure when appropriate:1}  Medications Ordered in ED Medications - No data to display  ED Course/ Medical Decision Making/ A&P   {   Click here for ABCD2, HEART and other calculatorsREFRESH Note before signing :1}                          Medical Decision Making Amount and/or Complexity of Data Reviewed Radiology: ordered.   This patient complains of ***; this involves an extensive number of treatment Options and is a complaint that carries with it a high risk of complications and morbidity. The differential includes ***  I ordered, reviewed and interpreted labs, which included *** I ordered medication *** and reviewed PMP when indicated. I ordered imaging studies which included *** and I independently    visualized and interpreted imaging which showed *** Additional history obtained from *** Previous records obtained and reviewed *** I consulted *** and discussed lab and imaging findings and discussed disposition.  Cardiac monitoring reviewed, *** Social determinants considered, *** Critical Interventions: ***  After the interventions stated above, I reevaluated the patient and found *** Admission and further testing considered, ***   {Document critical care time when appropriate:1} {Document review of labs and clinical decision tools ie heart score, Chads2Vasc2 etc:1}  {Document your  independent review of radiology images, and any outside records:1} {Document your discussion with family members, caretakers, and with consultants:1} {Document social determinants of health affecting pt's care:1} {Document your decision making why or why not admission, treatments were needed:1} Final Clinical Impression(s) / ED Diagnoses Final diagnoses:  None    Rx / DC Orders ED Discharge Orders     None

## 2023-02-09 NOTE — ED Triage Notes (Signed)
Pt c/o left hand pain x 2 weeks, reports he has been seen for same but pain continues to worsen

## 2023-02-12 DIAGNOSIS — S52251D Displaced comminuted fracture of shaft of ulna, right arm, subsequent encounter for closed fracture with routine healing: Secondary | ICD-10-CM | POA: Diagnosis not present

## 2023-02-13 ENCOUNTER — Other Ambulatory Visit: Payer: Self-pay | Admitting: Internal Medicine

## 2023-02-13 DIAGNOSIS — E1165 Type 2 diabetes mellitus with hyperglycemia: Secondary | ICD-10-CM

## 2023-02-26 ENCOUNTER — Telehealth: Payer: Self-pay | Admitting: Internal Medicine

## 2023-02-26 ENCOUNTER — Other Ambulatory Visit: Payer: Self-pay

## 2023-02-26 DIAGNOSIS — E1165 Type 2 diabetes mellitus with hyperglycemia: Secondary | ICD-10-CM

## 2023-02-26 MED ORDER — ACCU-CHEK GUIDE VI STRP
ORAL_STRIP | 0 refills | Status: DC
Start: 2023-02-26 — End: 2023-04-10

## 2023-02-26 NOTE — Telephone Encounter (Signed)
Patient came by office need med refill  atorvastatin (LIPITOR) 10 MG tablet [[id number]]   ACCU-CHEK GUIDE test strip [[id number]]    Pharmacy: [facility]

## 2023-02-26 NOTE — Telephone Encounter (Signed)
Spoke to patient, refills sent to pharmacy

## 2023-03-11 ENCOUNTER — Telehealth: Payer: Self-pay | Admitting: Internal Medicine

## 2023-03-11 ENCOUNTER — Other Ambulatory Visit: Payer: Self-pay

## 2023-03-11 DIAGNOSIS — Z794 Long term (current) use of insulin: Secondary | ICD-10-CM

## 2023-03-11 MED ORDER — JANUMET XR 50-1000 MG PO TB24
2.0000 | ORAL_TABLET | Freq: Every day | ORAL | 3 refills | Status: DC
Start: 2023-03-11 — End: 2024-01-07

## 2023-03-11 NOTE — Telephone Encounter (Signed)
patient called need refill  JANUMET XR 50-1000 MG TB24 [[id number]   [facility]

## 2023-03-11 NOTE — Telephone Encounter (Signed)
Refills sent

## 2023-03-14 DIAGNOSIS — G5622 Lesion of ulnar nerve, left upper limb: Secondary | ICD-10-CM | POA: Diagnosis not present

## 2023-03-20 ENCOUNTER — Encounter: Payer: Medicaid Other | Admitting: Internal Medicine

## 2023-03-24 DIAGNOSIS — R200 Anesthesia of skin: Secondary | ICD-10-CM | POA: Diagnosis not present

## 2023-04-07 ENCOUNTER — Encounter: Payer: Medicaid Other | Admitting: Internal Medicine

## 2023-04-10 ENCOUNTER — Other Ambulatory Visit: Payer: Self-pay | Admitting: Internal Medicine

## 2023-04-10 DIAGNOSIS — E1165 Type 2 diabetes mellitus with hyperglycemia: Secondary | ICD-10-CM

## 2023-04-13 HISTORY — PX: TOOTH EXTRACTION: SUR596

## 2023-04-23 ENCOUNTER — Encounter: Payer: Self-pay | Admitting: Cardiology

## 2023-04-23 ENCOUNTER — Ambulatory Visit: Payer: Medicaid Other | Attending: Cardiology | Admitting: Cardiology

## 2023-04-23 VITALS — BP 126/62 | HR 65 | Ht 71.0 in | Wt 225.4 lb

## 2023-04-23 DIAGNOSIS — I483 Typical atrial flutter: Secondary | ICD-10-CM

## 2023-04-23 DIAGNOSIS — E782 Mixed hyperlipidemia: Secondary | ICD-10-CM

## 2023-04-23 DIAGNOSIS — D6869 Other thrombophilia: Secondary | ICD-10-CM

## 2023-04-23 DIAGNOSIS — I10 Essential (primary) hypertension: Secondary | ICD-10-CM | POA: Diagnosis not present

## 2023-04-23 NOTE — Progress Notes (Signed)
Clinical Summary Mr. Sthill is a 57 y.o.male seen today for follow up of the following medical problems.   1. Atrial flutter - new diagnosis during Jan 2020 admission - at that time started on anticoag, later cardioverted 10/06/2018 - during 10/23/2018 visit was still in SR -  Cardizem 120 stopped after cardioversion due to some low heart rates.   - s/p DCCV 09/2018, we actually have not seen recurrent aflutter since that time   - no recent palpitations - compliant with meds, no bleeding on eliquis.    2. HTN - compliant with meds - takes chlorthalidone every other day due to prior AKI   3. Chest pain - 05/2020 visit with PA Vincenza Hews reported left sided chest pain - 05/2020 nuclear stress: no ischemia.    - denies recent symptoms.    4. DM2 - he is on atorvastatin 10mg  daily.    02/2022 TC 143 TG 85 HDL 42 LDL 85 12/2022 TC 101 TG 67 HDL 39 LDL 47 Past Medical History:  Diagnosis Date   Accidentally struck by falling tree 02/08/2015   Acute blood loss anemia 02/09/2015   Acute respiratory failure (HCC) 02/09/2015   Acute respiratory failure (HCC) 02/09/2015   Ankle fracture, right 02/09/2015   Arthritis    Asthma    Atrial flutter (HCC)    a. diagnosed in 08/2018 b. s/p DCCV in 09/2018 with return to NSR   Chronic pain due to trauma    Closed fracture of right ulna 02/08/2015   Closed right pilon fracture 02/23/2015   Diabetes mellitus    Diabetes mellitus (HCC)    Femur fracture, right (HCC) 02/08/2015   Heart murmur    History of blood clots    DVT   Hypertension    Injury of hand, extensor tendon, right, sequela 09/01/2018   Pneumothorax    Postlaminectomy syndrome, lumbar region 11/13/2015   Rib fracture    Thrombocytopenia (HCC) 02/09/2015   Traumatic closed displaced fracture of multiple ribs of right side 02/08/2015   Traumatic hemopneumothorax 02/09/2015   Upper GI bleed 05/26/2016   Upper GI bleeding 05/27/2016   Trivial. EGD unremarkable.     Allergies   Allergen Reactions   Bee Venom Anaphylaxis    HONEY BEES   Lisinopril Cough     Current Outpatient Medications  Medication Sig Dispense Refill   ACCU-CHEK GUIDE test strip USE 1 STRIP TO CHECK GLUCOSE TWICE DAILY 50 each 0   amLODipine (NORVASC) 10 MG tablet Take 1 tablet (10 mg total) by mouth daily. 90 tablet 3   apixaban (ELIQUIS) 5 MG TABS tablet Take 1 tablet (5 mg total) by mouth 2 (two) times daily. 60 tablet 5   atorvastatin (LIPITOR) 10 MG tablet Take 1 tablet (10 mg total) by mouth daily. 90 tablet 3   BD INSULIN SYRINGE U/F 31G X 5/16" 1 ML MISC USE AS DIRECTED 100 each 0   chlorhexidine (PERIDEX) 0.12 % solution Use as directed 15 mLs in the mouth or throat 2 (two) times daily.     chlorthalidone (HYGROTON) 25 MG tablet Take 0.5 tablets (12.5 mg total) by mouth every other day. 22 tablet 3   Cholecalciferol (VITAMIN D-3) 125 MCG (5000 UT) TABS Take 15,000 mg by mouth once a week.     glipiZIDE (GLUCOTROL) 5 MG tablet Take 1 tablet (5 mg total) by mouth 2 (two) times daily before a meal. 60 tablet 3   JANUMET XR 50-1000 MG TB24 Take  2 tablets by mouth daily. 180 tablet 3   losartan (COZAAR) 100 MG tablet Take 1 tablet (100 mg total) by mouth daily. 90 tablet 3   magnesium oxide (MAG-OX) 400 MG tablet Take 1 tablet (400 mg total) by mouth 2 (two) times daily for 5 days, THEN 1 tablet (400 mg total) daily. (Patient taking differently: 400mg  QD) 90 tablet 3   oxyCODONE-acetaminophen (PERCOCET/ROXICET) 5-325 MG tablet Take 1 tablet by mouth every 6 (six) hours as needed for severe pain. 10 tablet 0   No current facility-administered medications for this visit.     Past Surgical History:  Procedure Laterality Date   BACK SURGERY     CARDIOVERSION N/A 10/06/2018   Procedure: CARDIOVERSION;  Surgeon: Jonelle Sidle, MD;  Location: [facility];  Service: Cardiovascular;  Laterality: N/A;   CHOLECYSTECTOMY N/A 10/30/2016   Procedure: LAPAROSCOPIC CHOLECYSTECTOMY;  Surgeon: Franky Macho, MD;  Location: [facility];  Service: General;  Laterality: N/A;   COLONOSCOPY N/A 07/25/2016   Dr. Jena Gauss: 9 mm tubular adenoma. 5 year surveillance    ESOPHAGOGASTRODUODENOSCOPY  2009   EGD completed by Dr. Jena Gauss due to hematemesis. Findings of normal esophagus, tiny antral erosions, normal D1, D2   ESOPHAGOGASTRODUODENOSCOPY N/A 05/27/2016   Dr. Jena Gauss: normal    EXTERNAL FIXATION LEG Right 02/09/2015   Procedure: EXTERNAL FIXATION LEG;  Surgeon: Sheral Apley, MD;  [facility];  Service: Orthopedics;  Laterality: Right;   EXTERNAL FIXATION REMOVAL Right 02/14/2015   Procedure: REMOVAL EXTERNAL FIXATION LEG;  Surgeon: Sheral Apley, MD;  Location: [facility] OR;  Service: Orthopedics;  Laterality: Right;   FEMUR IM NAIL Right 02/09/2015   Procedure: INTRAMEDULLARY (IM) RETROGRADE FEMORAL NAILING;  Surgeon: Sheral Apley, MD;  Location: [facility] OR;  Service: Orthopedics;  Laterality: Right;   NOSE SURGERY     ORIF ANKLE FRACTURE Right 02/14/2015   Procedure: OPEN REDUCTION INTERNAL FIXATION (ORIF) PILON FRACTURE;  Surgeon: Sheral Apley, MD;  Location: [facility] OR;  Service: Orthopedics;  Laterality: Right;   ORIF ULNAR FRACTURE Right 02/09/2015   Procedure: OPEN REDUCTION INTERNAL FIXATION (ORIF) ULNAR FRACTURE;  Surgeon: Sheral Apley, MD;  Location: [facility] OR;  Service: Orthopedics;  Laterality: Right;   POLYPECTOMY  07/25/2016   Procedure: POLYPECTOMY;  Surgeon: Corbin Ade, MD;  Location: [facility];  Service: Endoscopy;;  colon     Allergies  Allergen Reactions   Bee Venom Anaphylaxis    HONEY BEES   Lisinopril Cough      Family History  Problem Relation Age of Onset   Diabetes Mother    COPD Mother    CAD Father        CABG in his 36's   Diabetes Sister    Atrial fibrillation Brother    Colon cancer Neg Hx      Social History Mr. Cappelletti reports that he has never smoked. He has never used smokeless tobacco. Mr. Clermont reports no history of alcohol use.   Review  of Systems CONSTITUTIONAL: No weight loss, fever, chills, weakness or fatigue.  HEENT: Eyes: No visual loss, blurred vision, double vision or yellow sclerae.No hearing loss, sneezing, congestion, runny nose or sore throat.  SKIN: No rash or itching.  CARDIOVASCULAR: per hpi RESPIRATORY: No shortness of breath, cough or sputum.  GASTROINTESTINAL: No anorexia, nausea, vomiting or diarrhea. No abdominal pain or blood.  GENITOURINARY: No burning on urination, no polyuria NEUROLOGICAL: No headache, dizziness, syncope, paralysis, ataxia, numbness or tingling in the  extremities. No change in bowel or bladder control.  MUSCULOSKELETAL: No muscle, back pain, joint pain or stiffness.  LYMPHATICS: No enlarged nodes. No history of splenectomy.  PSYCHIATRIC: No history of depression or anxiety.  ENDOCRINOLOGIC: No reports of sweating, cold or heat intolerance. No polyuria or polydipsia.  [name]   Physical Examination Today's Vitals   04/23/23 0955  BP: 126/62  Pulse: 65  SpO2: 96%  Weight: 225 lb 6.4 oz (102.2 kg)  Height: 5\' 11"  (1.803 m)   Body mass index is 31.44 kg/m.  Gen: resting comfortably, no acute distress HEENT: no scleral icterus, pupils equal round and reactive, no palptable cervical adenopathy,  CV: RRR, no m/rg, no jvd Resp: Clear to auscultation bilaterally GI: abdomen is soft, non-tender, non-distended, normal bowel sounds, no hepatosplenomegaly MSK: extremities are warm, no edema.  Skin: warm, no rash Neuro:  no focal deficits Psych: appropriate affect   Diagnostic Studies  Echocardiogram: 08/2018   Study Conclusions - Left ventricle: The cavity size was normal. Wall thickness was   normal. Systolic function was normal. The estimated ejection   fraction was in the range of 60% to 65%. Wall motion was normal;   there were no regional wall motion abnormalities. The study is   not technically sufficient to allow evaluation of LV diastolic   function. - Aortic valve:  Valve area (VTI): 2.83 cm^2. Valve area (Vmax):   2.61 cm^2. - Left atrium: The atrium was moderately to severely dilated. - Right atrium: The atrium was mildly dilated. - Atrial septum: No defect or patent foramen ovale was identified.     05/2020 nuclear stress There was no ST segment deviation noted during stress. The study is normal. There are no perfusion defects This is a low risk study. The left ventricular ejection fraction is normal (55-65%).   Assessment and Plan    Aflutter/acquired thrombophilia - s/p DCCV 09/2018, we actually have not seen recurrent aflutter since that time - no symptoms, continue current meds including eliquis for stroke prevention.    2. HTN -at goal, continue current meds  3. HLD - at goal, continue current meds  F/u 1 year  Antoine Poche, M.D

## 2023-04-23 NOTE — Patient Instructions (Signed)
Medication Instructions:  Your physician recommends that you continue on your current medications as directed. Please refer to the Current Medication list given to you today.  *If you need a refill on your cardiac medications before your next appointment, please call your pharmacy*   Lab Work: None  If you have labs (blood work) drawn today and your tests are completely normal, you will receive your results only by: MyChart Message (if you have MyChart) OR A paper copy in the mail If you have any lab test that is abnormal or we need to change your treatment, we will call you to review the results.   Testing/Procedures: None   Follow-Up: At [facility], you and your health needs are our priority.  As part of our continuing mission to provide you with exceptional heart care, we have created designated Provider Care Teams.  These Care Teams include your primary Cardiologist (physician) and Advanced Practice Providers (APPs -  Physician Assistants and Nurse Practitioners) who all work together to provide you with the care you need, when you need it.  We recommend signing up for the patient portal called "MyChart".  Sign up information is provided on this After Visit Summary.  MyChart is used to connect with patients for Virtual Visits (Telemedicine).  Patients are able to view lab/test results, encounter notes, upcoming appointments, etc.  Non-urgent messages can be sent to your provider as well.   To learn more about what you can do with MyChart, go to [email or web address].    Your next appointment:   1 year(s)  Provider:   You may see Dina Rich, MD or one of the following Advanced Practice Providers on your designated Care Team:   Randall An, PA-C  Jacolyn Reedy, [place]     Other Instructions

## 2023-04-24 ENCOUNTER — Ambulatory Visit (INDEPENDENT_AMBULATORY_CARE_PROVIDER_SITE_OTHER): Payer: Medicaid Other

## 2023-04-24 DIAGNOSIS — Z794 Long term (current) use of insulin: Secondary | ICD-10-CM | POA: Diagnosis not present

## 2023-04-24 DIAGNOSIS — E1169 Type 2 diabetes mellitus with other specified complication: Secondary | ICD-10-CM | POA: Diagnosis not present

## 2023-04-24 DIAGNOSIS — E1165 Type 2 diabetes mellitus with hyperglycemia: Secondary | ICD-10-CM

## 2023-04-24 LAB — HM DIABETES EYE EXAM

## 2023-04-24 NOTE — Progress Notes (Signed)
Jeremy Burgess arrived 04/24/2023 and has given verbal consent to obtain images and complete their overdue diabetic retinal screening.  The images have been sent to an ophthalmologist or optometrist for review and interpretation.  Results will be sent back to [name], MD for review.  Patient has been informed they will be contacted when we receive the results via telephone or MyChart

## 2023-05-07 DIAGNOSIS — G5602 Carpal tunnel syndrome, left upper limb: Secondary | ICD-10-CM | POA: Diagnosis not present

## 2023-05-08 ENCOUNTER — Encounter: Payer: Self-pay | Admitting: Internal Medicine

## 2023-05-08 ENCOUNTER — Ambulatory Visit: Payer: Medicaid Other | Admitting: Internal Medicine

## 2023-05-08 VITALS — BP 170/74 | HR 77 | Ht 71.0 in | Wt 225.2 lb

## 2023-05-08 DIAGNOSIS — M5416 Radiculopathy, lumbar region: Secondary | ICD-10-CM | POA: Insufficient documentation

## 2023-05-08 DIAGNOSIS — E782 Mixed hyperlipidemia: Secondary | ICD-10-CM | POA: Diagnosis not present

## 2023-05-08 DIAGNOSIS — Z125 Encounter for screening for malignant neoplasm of prostate: Secondary | ICD-10-CM

## 2023-05-08 DIAGNOSIS — Z794 Long term (current) use of insulin: Secondary | ICD-10-CM

## 2023-05-08 DIAGNOSIS — Z23 Encounter for immunization: Secondary | ICD-10-CM | POA: Diagnosis not present

## 2023-05-08 DIAGNOSIS — Z0001 Encounter for general adult medical examination with abnormal findings: Secondary | ICD-10-CM

## 2023-05-08 DIAGNOSIS — E1169 Type 2 diabetes mellitus with other specified complication: Secondary | ICD-10-CM

## 2023-05-08 DIAGNOSIS — E559 Vitamin D deficiency, unspecified: Secondary | ICD-10-CM | POA: Diagnosis not present

## 2023-05-08 DIAGNOSIS — I10 Essential (primary) hypertension: Secondary | ICD-10-CM

## 2023-05-08 MED ORDER — ACCU-CHEK GUIDE VI STRP
ORAL_STRIP | 11 refills | Status: DC
Start: 2023-05-08 — End: 2024-06-02

## 2023-05-08 MED ORDER — GLIPIZIDE ER 2.5 MG PO TB24
2.5000 mg | ORAL_TABLET | Freq: Every day | ORAL | 1 refills | Status: DC
Start: 2023-05-08 — End: 2023-11-17

## 2023-05-08 MED ORDER — LOSARTAN POTASSIUM 100 MG PO TABS: 50.0000 mg | ORAL_TABLET | Freq: Every day | ORAL | Status: DC

## 2023-05-08 MED ORDER — GABAPENTIN 100 MG PO CAPS
100.0000 mg | ORAL_CAPSULE | Freq: Every day | ORAL | 1 refills | Status: DC
Start: 2023-05-08 — End: 2023-09-09

## 2023-05-08 MED ORDER — CHLORTHALIDONE 25 MG PO TABS: 12.5000 mg | ORAL_TABLET | ORAL | 1 refills | Status: DC

## 2023-05-08 NOTE — Assessment & Plan Note (Signed)
Check lipid profile On statin

## 2023-05-08 NOTE — Assessment & Plan Note (Signed)
Has postlaminectomy syndrome Has radiating pain towards LLE Unable to take oral NSAID as he takes Eliquis Started gabapentin 100 mg nightly considering his neuropathic pain

## 2023-05-08 NOTE — Assessment & Plan Note (Signed)

## 2023-05-08 NOTE — Assessment & Plan Note (Addendum)
Lab Results  Component Value Date   HGBA1C 6.9 (H) 12/18/2022   Well-controlled Associated with HTN and HLD On Janumet BID Had DCed Levemir as he had hypoglycemia episodes at times, if needed Decreased dose of glipizide to 2.5 mg QD as he had hypoglycemia Advised to follow diabetic diet On ACEi and statin now F/u CMP and lipid panel Diabetic eye exam: Advised to follow up with Ophthalmology for diabetic eye exam

## 2023-05-08 NOTE — Progress Notes (Signed)
Established Patient Office Visit  Subjective:  Patient ID: Jeremy Burgess, male    DOB: May 31, 1966  Age: 57 y.o. MRN: [id number]  CC:  Chief Complaint  Patient presents with   Annual Exam    HPI Jeremy Burgess is a 57 y.o. male with past medical history of HTN, atrial flutter, type II DM with neuropathy, chronic low back pain, chronic right ankle pain due to trauma and obesity who presents for annual physical.  Type II DM: His HbA1c was 6.9 in 05/24.  He takes Janumet 50-1000 mg twice daily. He was recently placed on glipizide 5 mg twice daily, but reports episodic hypoglycemia and has stopped taking it. He denies any polyuria or polydipsia or polyphagia. His blood glucose ranges around 120-140 mostly.   HTN: BP is elevated today. Takes amlodipine 10 mg QD regularly.  He stopped taking losartan due to hypotensive episodes.  He has run out of chlorthalidone as well.  Patient denies headache, dizziness, chest pain, dyspnea or palpitations.  He reports chronic low back pain, radiating to LLE.  He is back pain is constant, sharp and is associated with numbness of the left leg and foot. He also has numbness of LUE and had NCS at [facility] clinic.   Past Medical History:  Diagnosis Date   Accidentally struck by falling tree 02/08/2015   Acute blood loss anemia 02/09/2015   Acute respiratory failure (HCC) 02/09/2015   Acute respiratory failure (HCC) 02/09/2015   Ankle fracture, right 02/09/2015   Arthritis    Asthma    Atrial flutter (HCC)    a. diagnosed in 08/2018 b. s/p DCCV in 09/2018 with return to NSR   Chronic pain due to trauma    Closed fracture of right ulna 02/08/2015   Closed right pilon fracture 02/23/2015   Diabetes mellitus    Diabetes mellitus (HCC)    Femur fracture, right (HCC) 02/08/2015   Heart murmur    History of blood clots    DVT   Hypertension    Injury of hand, extensor tendon, right, sequela 09/01/2018   Pneumothorax    Postlaminectomy syndrome, lumbar  region 11/13/2015   Rib fracture    Thrombocytopenia (HCC) 02/09/2015   Traumatic closed displaced fracture of multiple ribs of right side 02/08/2015   Traumatic hemopneumothorax 02/09/2015   Upper GI bleed 05/26/2016   Upper GI bleeding 05/27/2016   Trivial. EGD unremarkable.    Past Surgical History:  Procedure Laterality Date   BACK SURGERY     CARDIOVERSION N/A 10/06/2018   Procedure: CARDIOVERSION;  Surgeon: Jonelle Sidle, MD;  Location: [facility];  Service: Cardiovascular;  Laterality: N/A;   CHOLECYSTECTOMY N/A 10/30/2016   Procedure: LAPAROSCOPIC CHOLECYSTECTOMY;  Surgeon: Franky Macho, MD;  Location: [facility];  Service: General;  Laterality: N/A;   COLONOSCOPY N/A 07/25/2016   Dr. Jena Gauss: 9 mm tubular adenoma. 5 year surveillance    ESOPHAGOGASTRODUODENOSCOPY  2009   EGD completed by Dr. Jena Gauss due to hematemesis. Findings of normal esophagus, tiny antral erosions, normal D1, D2   ESOPHAGOGASTRODUODENOSCOPY N/A 05/27/2016   Dr. Jena Gauss: normal    EXTERNAL FIXATION LEG Right 02/09/2015   Procedure: EXTERNAL FIXATION LEG;  Surgeon: Sheral Apley, MD;  Location: [facility] OR;  Service: Orthopedics;  Laterality: Right;   EXTERNAL FIXATION REMOVAL Right 02/14/2015   Procedure: REMOVAL EXTERNAL FIXATION LEG;  Surgeon: Sheral Apley, MD;  Location: [facility] OR;  Service: Orthopedics;  Laterality: Right;   FEMUR IM NAIL Right 02/09/2015  Procedure: INTRAMEDULLARY (IM) RETROGRADE FEMORAL NAILING;  Surgeon: Sheral Apley, MD;  Location: [facility] OR;  Service: Orthopedics;  Laterality: Right;   NOSE SURGERY     ORIF ANKLE FRACTURE Right 02/14/2015   Procedure: OPEN REDUCTION INTERNAL FIXATION (ORIF) PILON FRACTURE;  Surgeon: Sheral Apley, MD;  Location: [facility] OR;  Service: Orthopedics;  Laterality: Right;   ORIF ULNAR FRACTURE Right 02/09/2015   Procedure: OPEN REDUCTION INTERNAL FIXATION (ORIF) ULNAR FRACTURE;  Surgeon: Sheral Apley, MD;  Location: [facility] OR;  Service: Orthopedics;  Laterality: Right;    POLYPECTOMY  07/25/2016   Procedure: POLYPECTOMY;  Surgeon: Corbin Ade, MD;  Location: [facility];  Service: Endoscopy;;  colon   TOOTH EXTRACTION  04/2023    Family History  Problem Relation Age of Onset   Diabetes Mother    COPD Mother    CAD Father        CABG in his 57's   Diabetes Sister    Atrial fibrillation Brother    Colon cancer Neg Hx     Social History   Socioeconomic History   Marital status: Single    Spouse name: Not on file   Number of children: Not on file   Years of education: Not on file   Highest education level: Not on file  Occupational History   Not on file  Tobacco Use   Smoking status: Never   Smokeless tobacco: Never  Vaping Use   Vaping status: Never Used  Substance and Sexual Activity   Alcohol use: No    Comment: history of alcohol abuse but quit in 2008    Drug use: No   Sexual activity: Yes  Other Topics Concern   Not on file  Social History Narrative       Single.Lives alone.On disability secondary to tree fall accident 2016.   Social Determinants of Health   Financial Resource Strain: Medium Risk (06/27/2020)   Overall Financial Resource Strain (CARDIA)    Difficulty of Paying Living Expenses: Somewhat hard  Food Insecurity: Not on file  Transportation Needs: No Transportation Needs (08/30/2022)   PRAPARE - Administrator, Civil Service (Medical): No    Lack of Transportation (Non-Medical): No  Physical Activity: Not on file  Stress: Not on file  Social Connections: Not on file  Intimate Partner Violence: Not on file    Outpatient Medications Prior to Visit  Medication Sig Dispense Refill   amLODipine (NORVASC) 10 MG tablet Take 1 tablet (10 mg total) by mouth daily. 90 tablet 3   apixaban (ELIQUIS) 5 MG TABS tablet Take 1 tablet (5 mg total) by mouth 2 (two) times daily. 60 tablet 5   atorvastatin (LIPITOR) 10 MG tablet Take 1 tablet (10 mg total) by mouth daily. 90 tablet 3   BD INSULIN SYRINGE U/F  31G X 5/16" 1 ML MISC USE AS DIRECTED 100 each 0   Cholecalciferol (VITAMIN D-3) 125 MCG (5000 UT) TABS Take 15,000 mg by mouth once a week.     JANUMET XR 50-1000 MG TB24 Take 2 tablets by mouth daily. 180 tablet 3   magnesium oxide (MAG-OX) 400 MG tablet Take 1 tablet (400 mg total) by mouth 2 (two) times daily for 5 days, THEN 1 tablet (400 mg total) daily. (Patient taking differently: 400mg  QD) 90 tablet 3   oxyCODONE-acetaminophen (PERCOCET/ROXICET) 5-325 MG tablet Take 1 tablet by mouth every 6 (six) hours as needed for severe pain. (Patient not taking: Reported on 04/23/2023)  10 tablet 0   ACCU-CHEK GUIDE test strip USE 1 STRIP TO CHECK GLUCOSE TWICE DAILY 50 each 0   chlorthalidone (HYGROTON) 25 MG tablet Take 0.5 tablets (12.5 mg total) by mouth every other day. 22 tablet 3   glipiZIDE (GLUCOTROL) 5 MG tablet Take 1 tablet (5 mg total) by mouth 2 (two) times daily before a meal. 60 tablet 3   losartan (COZAAR) 100 MG tablet Take 1 tablet (100 mg total) by mouth daily. 90 tablet 3   No facility-administered medications prior to visit.    Allergies  Allergen Reactions   Bee Venom Anaphylaxis    HONEY BEES   Lisinopril Cough    ROS Review of Systems  Constitutional:  Negative for chills and fever.  HENT:  Negative for congestion and sore throat.   Eyes:  Negative for pain and discharge.  Respiratory:  Negative for cough and shortness of breath.   Cardiovascular:  Negative for chest pain and palpitations.  Gastrointestinal:  Negative for constipation, diarrhea, nausea and vomiting.  Endocrine: Negative for polydipsia and polyuria.  Genitourinary:  Negative for dysuria and hematuria.  Musculoskeletal:  Positive for arthralgias and back pain. Negative for neck pain and neck stiffness.  Skin:  Negative for rash.  Neurological:  Positive for numbness. Negative for dizziness, weakness and headaches.  Psychiatric/Behavioral:  Negative for agitation and behavioral problems.        Objective:    Physical Exam Vitals reviewed.  Constitutional:      General: He is not in acute distress.    Appearance: He is obese. He is not diaphoretic.  HENT:     Head: Normocephalic and atraumatic.     Nose: Nose normal.     Mouth/Throat:     Mouth: Mucous membranes are moist.  Eyes:     General: No scleral icterus.    Extraocular Movements: Extraocular movements intact.  Cardiovascular:     Rate and Rhythm: Normal rate and regular rhythm.     Pulses: Normal pulses.     Heart sounds: Normal heart sounds. No murmur heard. Pulmonary:     Breath sounds: Normal breath sounds. No wheezing or rales.  Abdominal:     Palpations: Abdomen is soft.     Tenderness: There is no abdominal tenderness.  Musculoskeletal:     Cervical back: Neck supple. No tenderness.     Lumbar back: Tenderness present. Decreased range of motion.     Right lower leg: No edema.     Left lower leg: No edema.  Skin:    General: Skin is warm.     Findings: No rash.  Neurological:     General: No focal deficit present.     Mental Status: He is alert and oriented to person, place, and time.     Sensory: Sensory deficit (LUE, LLE) present.     Motor: No weakness.  Psychiatric:        Mood and Affect: Mood normal.        Behavior: Behavior normal.     BP (!) 170/74 (BP Location: Right Arm)   Pulse 77   Ht 5\' 11"  (1.803 m)   Wt 225 lb 3.2 oz (102.2 kg)   SpO2 96%   BMI 31.41 kg/m  Wt Readings from Last 3 Encounters:  05/08/23 225 lb 3.2 oz (102.2 kg)  04/23/23 225 lb 6.4 oz (102.2 kg)  02/09/23 240 lb (108.9 kg)    Lab Results  Component Value Date   TSH 2.720 02/13/2022  Lab Results  Component Value Date   WBC 7.7 01/20/2023   HGB 12.1 (L) 01/20/2023   HCT 36.4 (L) 01/20/2023   MCV 86.1 01/20/2023   PLT 214 01/20/2023   Lab Results  Component Value Date   NA 134 (L) 01/20/2023   K 4.0 01/20/2023   CO2 24 01/20/2023   GLUCOSE 117 (H) 01/20/2023   BUN 16 01/20/2023   CREATININE  1.15 01/20/2023   BILITOT 0.4 12/18/2022   ALKPHOS 67 12/18/2022   AST 22 12/18/2022   ALT 17 12/18/2022   PROT 7.2 12/18/2022   ALBUMIN 4.3 12/18/2022   CALCIUM 8.5 (L) 01/20/2023   ANIONGAP 7 01/20/2023   EGFR 57 (L) 12/18/2022   Lab Results  Component Value Date   CHOL 101 12/18/2022   Lab Results  Component Value Date   HDL 39 (L) 12/18/2022   Lab Results  Component Value Date   LDLCALC 47 12/18/2022   Lab Results  Component Value Date   TRIG 67 12/18/2022   Lab Results  Component Value Date   CHOLHDL 2.6 12/18/2022   Lab Results  Component Value Date   HGBA1C 6.9 (H) 12/18/2022      Assessment & Plan:   Problem List Items Addressed This Visit       Cardiovascular and Mediastinum   Essential hypertension    BP Readings from Last 1 Encounters:  05/08/23 (!) 170/74   Uncontrolled with amlodipine 10 mg QD now Had hypotension with losartan 100 mg QD, reduced dose of losartan to 50 mg QD, advised to take half tablet of 100 mg for now Used to take chlorthalidone every other day - refilled Counseled for compliance with the medications Advised DASH diet and moderate exercise/walking, at least 150 mins/week      Relevant Medications   losartan (COZAAR) 100 MG tablet   chlorthalidone (HYGROTON) 25 MG tablet   Other Relevant Orders   TSH   CMP14+EGFR   CBC with Differential/Platelet     Endocrine   Type 2 diabetes mellitus with other specified complication (HCC)    Lab Results  Component Value Date   HGBA1C 6.9 (H) 12/18/2022   Well-controlled Associated with HTN and HLD On Janumet BID Had DCed Levemir as he had hypoglycemia episodes at times, if needed Decreased dose of glipizide to 2.5 mg QD as he had hypoglycemia Advised to follow diabetic diet On ACEi and statin now F/u CMP and lipid panel Diabetic eye exam: Advised to follow up with Ophthalmology for diabetic eye exam      Relevant Medications   losartan (COZAAR) 100 MG tablet   glucose  blood (ACCU-CHEK GUIDE) test strip   glipiZIDE (GLUCOTROL XL) 2.5 MG 24 hr tablet   Other Relevant Orders   Hemoglobin A1c   CMP14+EGFR   Urine Microalbumin w/creat. ratio     Nervous and Auditory   Lumbar radiculopathy    Has postlaminectomy syndrome Has radiating pain towards LLE Unable to take oral NSAID as he takes Eliquis Started gabapentin 100 mg nightly considering his neuropathic pain      Relevant Medications   gabapentin (NEURONTIN) 100 MG capsule     Other   Vitamin D deficiency   Relevant Orders   VITAMIN D 25 Hydroxy (Vit-D Deficiency, Fractures)   Mixed hyperlipidemia    Check lipid profile On statin      Relevant Medications   losartan (COZAAR) 100 MG tablet   chlorthalidone (HYGROTON) 25 MG tablet   Other Relevant Orders  Lipid panel   Prostate cancer screening    Ordered PSA after discussing its limitations for prostate cancer screening, including false positive results leading to additional investigations.      Relevant Orders   PSA   Encounter for general adult medical examination with abnormal findings - Primary    Physical exam as documented. Counseling done  re healthy lifestyle involving commitment to 150 minutes exercise per week, heart healthy diet, and attaining healthy weight.The importance of adequate sleep also discussed. Changes in health habits are decided on by the patient with goals and time frames  set for achieving them. Immunization and cancer screening needs are specifically addressed at this visit.      Other Visit Diagnoses     Encounter for immunization       Relevant Orders   Flu vaccine trivalent PF, 6mos and older(Flulaval,Afluria,Fluarix,Fluzone) (Completed)       Meds ordered this encounter  Medications   losartan (COZAAR) 100 MG tablet    Sig: Take 0.5 tablets (50 mg total) by mouth daily.   chlorthalidone (HYGROTON) 25 MG tablet    Sig: Take 0.5 tablets (12.5 mg total) by mouth every other day.    Dispense:   22 tablet    Refill:  1   glucose blood (ACCU-CHEK GUIDE) test strip    Sig: USE 1 STRIP TO CHECK GLUCOSE TWICE DAILY    Dispense:  100 each    Refill:  11   glipiZIDE (GLUCOTROL XL) 2.5 MG 24 hr tablet    Sig: Take 1 tablet (2.5 mg total) by mouth daily with breakfast.    Dispense:  90 tablet    Refill:  1   gabapentin (NEURONTIN) 100 MG capsule    Sig: Take 1 capsule (100 mg total) by mouth at bedtime.    Dispense:  30 capsule    Refill:  1    Follow-up: Return in about 4 weeks (around 06/05/2023) for HTN and DM.    [name], MD

## 2023-05-08 NOTE — Assessment & Plan Note (Addendum)
Ordered PSA after discussing its limitations for prostate cancer screening, including false positive results leading to additional investigations. 

## 2023-05-08 NOTE — Assessment & Plan Note (Addendum)
BP Readings from Last 1 Encounters:  05/08/23 (!) 170/74   Uncontrolled with amlodipine 10 mg QD now Had hypotension with losartan 100 mg QD, reduced dose of losartan to 50 mg QD, advised to take half tablet of 100 mg for now Used to take chlorthalidone every other day - refilled Counseled for compliance with the medications Advised DASH diet and moderate exercise/walking, at least 150 mins/week

## 2023-05-08 NOTE — Patient Instructions (Addendum)
Please start taking Losartan half tablet once daily. Please continue taking Amlodipine and Chlorthalidone as prescribed.  Please start taking Glipizide 2.5 mg once daily. Continue taking Janumet as prescribed.  Please start taking Gabapentin 100 mg at bedtime.  Please continue to take medications as prescribed.  Please continue to follow low carb diet and perform moderate exercise/walking at least 150 mins/week.

## 2023-05-10 LAB — CBC WITH DIFFERENTIAL/PLATELET
Basophils Absolute: 0.1 10*3/uL (ref 0.0–0.2)
Basos: 1 %
EOS (ABSOLUTE): 0.2 10*3/uL (ref 0.0–0.4)
Eos: 2 %
Hematocrit: 43.5 % (ref 37.5–51.0)
Hemoglobin: 13.7 g/dL (ref 13.0–17.7)
Immature Grans (Abs): 0 10*3/uL (ref 0.0–0.1)
Immature Granulocytes: 0 %
Lymphocytes Absolute: 2.2 10*3/uL (ref 0.7–3.1)
Lymphs: 22 %
MCH: 27.6 pg (ref 26.6–33.0)
MCHC: 31.5 g/dL (ref 31.5–35.7)
MCV: 88 fL (ref 79–97)
Monocytes Absolute: 1 10*3/uL — ABNORMAL HIGH (ref 0.1–0.9)
Monocytes: 10 %
Neutrophils Absolute: 6.7 10*3/uL (ref 1.4–7.0)
Neutrophils: 65 %
Platelets: 254 10*3/uL (ref 150–450)
RBC: 4.96 x10E6/uL (ref 4.14–5.80)
RDW: 13.2 % (ref 11.6–15.4)
WBC: 10.1 10*3/uL (ref 3.4–10.8)

## 2023-05-10 LAB — HEMOGLOBIN A1C
Est. average glucose Bld gHb Est-mCnc: 186 mg/dL
Hgb A1c MFr Bld: 8.1 % — ABNORMAL HIGH (ref 4.8–5.6)

## 2023-05-10 LAB — PSA: Prostate Specific Ag, Serum: 1.3 ng/mL (ref 0.0–4.0)

## 2023-05-10 LAB — CMP14+EGFR
ALT: 16 [IU]/L (ref 0–44)
AST: 21 [IU]/L (ref 0–40)
Albumin: 4.6 g/dL (ref 3.8–4.9)
Alkaline Phosphatase: 92 [IU]/L (ref 44–121)
BUN/Creatinine Ratio: 16 (ref 9–20)
BUN: 18 mg/dL (ref 6–24)
Bilirubin Total: 0.3 mg/dL (ref 0.0–1.2)
CO2: 23 mmol/L (ref 20–29)
Calcium: 10.1 mg/dL (ref 8.7–10.2)
Chloride: 100 mmol/L (ref 96–106)
Creatinine, Ser: 1.15 mg/dL (ref 0.76–1.27)
Globulin, Total: 2.8 g/dL (ref 1.5–4.5)
Glucose: 148 mg/dL — ABNORMAL HIGH (ref 70–99)
Potassium: 4.4 mmol/L (ref 3.5–5.2)
Sodium: 139 mmol/L (ref 134–144)
Total Protein: 7.4 g/dL (ref 6.0–8.5)
eGFR: 73 mL/min/{1.73_m2} (ref >59)

## 2023-05-10 LAB — LIPID PANEL
Chol/HDL Ratio: 2.3 {ratio} (ref 0.0–5.0)
Cholesterol, Total: 116 mg/dL (ref 100–199)
HDL: 51 mg/dL (ref >39)
LDL Chol Calc (NIH): 49 mg/dL (ref 0–99)
Triglycerides: 77 mg/dL (ref 0–149)
VLDL Cholesterol Cal: 16 mg/dL (ref 5–40)

## 2023-05-10 LAB — MICROALBUMIN / CREATININE URINE RATIO
Creatinine, Urine: 119.3 mg/dL
Microalb/Creat Ratio: 81 mg/g{creat} — ABNORMAL HIGH (ref 0–29)
Microalbumin, Urine: 96.7 ug/mL

## 2023-05-10 LAB — VITAMIN D 25 HYDROXY (VIT D DEFICIENCY, FRACTURES): Vit D, 25-Hydroxy: 53.7 ng/mL (ref 30.0–100.0)

## 2023-05-10 LAB — TSH: TSH: 3.14 u[IU]/mL (ref 0.450–4.500)

## 2023-05-15 ENCOUNTER — Encounter: Payer: Self-pay | Admitting: Internal Medicine

## 2023-05-30 ENCOUNTER — Encounter: Payer: Self-pay | Admitting: Internal Medicine

## 2023-05-30 ENCOUNTER — Ambulatory Visit: Payer: Medicaid Other | Admitting: Internal Medicine

## 2023-05-30 VITALS — BP 138/88 | HR 67 | Ht 71.0 in | Wt 223.0 lb

## 2023-05-30 DIAGNOSIS — J019 Acute sinusitis, unspecified: Secondary | ICD-10-CM | POA: Insufficient documentation

## 2023-05-30 DIAGNOSIS — I483 Typical atrial flutter: Secondary | ICD-10-CM | POA: Diagnosis not present

## 2023-05-30 MED ORDER — APIXABAN 5 MG PO TABS: 5.0000 mg | ORAL_TABLET | Freq: Two times a day (BID) | ORAL | 5 refills | Status: DC

## 2023-05-30 MED ORDER — GUAIFENESIN-CODEINE 100-10 MG/5ML PO SYRP
5.0000 mL | ORAL_SOLUTION | Freq: Three times a day (TID) | ORAL | 0 refills | Status: DC | PRN
Start: 2023-05-30 — End: 2023-09-09

## 2023-05-30 MED ORDER — AMOXICILLIN-POT CLAVULANATE 875-125 MG PO TABS
1.0000 | ORAL_TABLET | Freq: Two times a day (BID) | ORAL | 0 refills | Status: DC
Start: 2023-05-30 — End: 2023-06-09

## 2023-05-30 NOTE — Assessment & Plan Note (Signed)
During hospitalization in 2020 Was cardioverted On Eliquis - refilled Had bradycardia with Cardizem Followed by Cardiology

## 2023-05-30 NOTE — Assessment & Plan Note (Signed)
Started empiric Augmentin considering persistent symptoms Cheratussin as needed for cough Advised to perform warm water gargling or use phenol/Oralseptic throat spray as needed for sore throat Can use nasal saline spray or Flonase for nasal congestion/allergies

## 2023-05-30 NOTE — Patient Instructions (Signed)
Please start taking Augmentin as prescribed.  Please start taking Cheratussin syrup as needed for cough.  Perform warm water gargling or use phenol/chloraseptic throat spray as needed for sore throat.

## 2023-05-30 NOTE — Progress Notes (Signed)
Acute Office Visit  Subjective:    Patient ID: Jeremy Burgess, male    DOB: [birth year], 57 y.o.   MRN: [id number]  Chief Complaint  Patient presents with   head cold    Chest and head cold for almost two weeks    HPI Patient is in today for complaint of cough and chest congestion  for the last 2 weeks.  He initially had sore throat as well.  Denies any fever or chills.  Denies any dyspnea or wheezing currently.  He has not tried any OTC medicine as he was concerned about interaction with Eliquis.  Past Medical History:  Diagnosis Date   Accidentally struck by falling tree 02/08/2015   Acute blood loss anemia 02/09/2015   Acute respiratory failure (HCC) 02/09/2015   Acute respiratory failure (HCC) 02/09/2015   Ankle fracture, right 02/09/2015   Arthritis    Asthma    Atrial flutter (HCC)    a. diagnosed in 08/2018 b. s/p DCCV in 09/2018 with return to NSR   Chronic pain due to trauma    Closed fracture of right ulna 02/08/2015   Closed right pilon fracture 02/23/2015   Diabetes mellitus    Diabetes mellitus (HCC)    Femur fracture, right (HCC) 02/08/2015   Heart murmur    History of blood clots    DVT   Hypertension    Injury of hand, extensor tendon, right, sequela 09/01/2018   Pneumothorax    Postlaminectomy syndrome, lumbar region 11/13/2015   Rib fracture    Thrombocytopenia (HCC) 02/09/2015   Traumatic closed displaced fracture of multiple ribs of right side 02/08/2015   Traumatic hemopneumothorax 02/09/2015   Upper GI bleed 05/26/2016   Upper GI bleeding 05/27/2016   Trivial. EGD unremarkable.    Past Surgical History:  Procedure Laterality Date   BACK SURGERY     CARDIOVERSION N/A 10/06/2018   Procedure: CARDIOVERSION;  Surgeon: Jonelle Sidle, MD;  Location: [facility];  Service: Cardiovascular;  Laterality: N/A;   CHOLECYSTECTOMY N/A 10/30/2016   Procedure: LAPAROSCOPIC CHOLECYSTECTOMY;  Surgeon: Franky Macho, MD;  Location: [facility];  Service: General;  Laterality:  N/A;   COLONOSCOPY N/A 07/25/2016   Dr. Jena Gauss: 9 mm tubular adenoma. 5 year surveillance    ESOPHAGOGASTRODUODENOSCOPY  2009   EGD completed by Dr. Jena Gauss due to hematemesis. Findings of normal esophagus, tiny antral erosions, normal D1, D2   ESOPHAGOGASTRODUODENOSCOPY N/A 05/27/2016   Dr. Jena Gauss: normal    EXTERNAL FIXATION LEG Right 02/09/2015   Procedure: EXTERNAL FIXATION LEG;  Surgeon: Sheral Apley, MD;  Location: [facility] OR;  Service: Orthopedics;  Laterality: Right;   EXTERNAL FIXATION REMOVAL Right 02/14/2015   Procedure: REMOVAL EXTERNAL FIXATION LEG;  Surgeon: Sheral Apley, MD;  Location: [facility];  Service: Orthopedics;  Laterality: Right;   FEMUR IM NAIL Right 02/09/2015   Procedure: INTRAMEDULLARY (IM) RETROGRADE FEMORAL NAILING;  Surgeon: Sheral Apley, MD;  Location: [facility] OR;  Service: Orthopedics;  Laterality: Right;   NOSE SURGERY     ORIF ANKLE FRACTURE Right 02/14/2015   Procedure: OPEN REDUCTION INTERNAL FIXATION (ORIF) PILON FRACTURE;  Surgeon: Sheral Apley, MD;  Location: [facility];  Service: Orthopedics;  Laterality: Right;   ORIF ULNAR FRACTURE Right 02/09/2015   Procedure: OPEN REDUCTION INTERNAL FIXATION (ORIF) ULNAR FRACTURE;  Surgeon: Sheral Apley, MD;  Location: [facility];  Service: Orthopedics;  Laterality: Right;   POLYPECTOMY  07/25/2016   Procedure: POLYPECTOMY;  Surgeon: Corbin Ade, MD;  [facility]: [facility];  Service: Endoscopy;;  colon   TOOTH EXTRACTION  04/2023    Family History  Problem Relation Age of Onset   Diabetes Mother    COPD Mother    CAD Father        CABG in his 60's   Diabetes Sister    Atrial fibrillation Brother    Colon cancer Neg Hx     Social History   Socioeconomic History   Marital status: Single    Spouse name: Not on file   Number of children: Not on file   Years of education: Not on file   Highest education level: Not on file  Occupational History   Not on file  Tobacco Use   Smoking status: Never    Smokeless tobacco: Never  Vaping Use   Vaping status: Never Used  Substance and Sexual Activity   Alcohol use: No    Comment: history of alcohol abuse but quit in 2008    Drug use: No   Sexual activity: Yes  Other Topics Concern   Not on file  Social History Narrative       Single.Lives alone.On disability secondary to tree fall accident 2016.   Social Determinants of Health   Financial Resource Strain: Medium Risk (06/27/2020)   Overall Financial Resource Strain (CARDIA)    Difficulty of Paying Living Expenses: Somewhat hard  Food Insecurity: Not on file  Transportation Needs: No Transportation Needs (08/30/2022)   PRAPARE - Administrator, Civil Service (Medical): No    Lack of Transportation (Non-Medical): No  Physical Activity: Not on file  Stress: Not on file  Social Connections: Not on file  Intimate Partner Violence: Not on file    Outpatient Medications Prior to Visit  Medication Sig Dispense Refill   amLODipine (NORVASC) 10 MG tablet Take 1 tablet (10 mg total) by mouth daily. 90 tablet 3   atorvastatin (LIPITOR) 10 MG tablet Take 1 tablet (10 mg total) by mouth daily. 90 tablet 3   BD INSULIN SYRINGE U/F 31G X 5/16" 1 ML MISC USE AS DIRECTED 100 each 0   chlorthalidone (HYGROTON) 25 MG tablet Take 0.5 tablets (12.5 mg total) by mouth every other day. 22 tablet 1   Cholecalciferol (VITAMIN D-3) 125 MCG (5000 UT) TABS Take 15,000 mg by mouth once a week.     gabapentin (NEURONTIN) 100 MG capsule Take 1 capsule (100 mg total) by mouth at bedtime. 30 capsule 1   glipiZIDE (GLUCOTROL XL) 2.5 MG 24 hr tablet Take 1 tablet (2.5 mg total) by mouth daily with breakfast. 90 tablet 1   glucose blood (ACCU-CHEK GUIDE) test strip USE 1 STRIP TO CHECK GLUCOSE TWICE DAILY 100 each 11   JANUMET XR 50-1000 MG TB24 Take 2 tablets by mouth daily. 180 tablet 3   losartan (COZAAR) 100 MG tablet Take 0.5 tablets (50 mg total) by mouth daily.     magnesium oxide (MAG-OX) 400 MG  tablet Take 1 tablet (400 mg total) by mouth 2 (two) times daily for 5 days, THEN 1 tablet (400 mg total) daily. (Patient taking differently: 400mg  QD) 90 tablet 3   oxyCODONE-acetaminophen (PERCOCET/ROXICET) 5-325 MG tablet Take 1 tablet by mouth every 6 (six) hours as needed for severe pain. (Patient not taking: Reported on 04/23/2023) 10 tablet 0   apixaban (ELIQUIS) 5 MG TABS tablet Take 1 tablet (5 mg total) by mouth 2 (two) times daily. 60 tablet 5   No facility-administered medications  prior to visit.    Allergies  Allergen Reactions   Bee Venom Anaphylaxis    HONEY BEES   Lisinopril Cough    Review of Systems  Constitutional:  Negative for chills and fever.  HENT:  Positive for congestion, rhinorrhea and sore throat.   Eyes:  Negative for pain and discharge.  Respiratory:  Positive for cough. Negative for shortness of breath.   Cardiovascular:  Negative for chest pain and palpitations.  Gastrointestinal:  Negative for constipation, diarrhea, nausea and vomiting.  Endocrine: Negative for polydipsia and polyuria.  Genitourinary:  Negative for dysuria and hematuria.  Musculoskeletal:  Positive for arthralgias and back pain. Negative for neck pain and neck stiffness.  Skin:  Negative for rash.  Neurological:  Positive for numbness. Negative for dizziness, weakness and headaches.  Psychiatric/Behavioral:  Negative for agitation and behavioral problems.        Objective:    Physical Exam Vitals reviewed.  Constitutional:      General: He is not in acute distress.    Appearance: He is obese. He is not diaphoretic.  HENT:     Head: Normocephalic and atraumatic.     Nose: Congestion present.     Right Sinus: Frontal sinus tenderness present.     Left Sinus: Frontal sinus tenderness present.     Mouth/Throat:     Mouth: Mucous membranes are moist.  Eyes:     General: No scleral icterus.    Extraocular Movements: Extraocular movements intact.  Cardiovascular:     Rate and  Rhythm: Normal rate and regular rhythm.     Pulses: Normal pulses.     Heart sounds: Normal heart sounds. No murmur heard. Pulmonary:     Breath sounds: Normal breath sounds. No wheezing or rales.  Musculoskeletal:     Cervical back: Neck supple. No tenderness.     Right lower leg: No edema.     Left lower leg: No edema.  Skin:    General: Skin is warm.     Findings: No rash.  Neurological:     General: No focal deficit present.     Mental Status: He is alert and oriented to person, place, and time.     Sensory: Sensory deficit (LUE, LLE) present.     Motor: No weakness.  Psychiatric:        Mood and Affect: Mood normal.        Behavior: Behavior normal.     BP 138/88 (BP Location: Left Arm)   Pulse 67   Ht 5\' 11"  (1.803 m)   Wt 223 lb (101.2 kg)   SpO2 96%   BMI 31.10 kg/m  Wt Readings from Last 3 Encounters:  05/30/23 223 lb (101.2 kg)  05/08/23 225 lb 3.2 oz (102.2 kg)  04/23/23 225 lb 6.4 oz (102.2 kg)        Assessment & Plan:   Problem List Items Addressed This Visit       Cardiovascular and Mediastinum   Typical atrial flutter (HCC)    During hospitalization in 2020 Was cardioverted On Eliquis - refilled Had bradycardia with Cardizem Followed by Cardiology      Relevant Medications   apixaban (ELIQUIS) 5 MG TABS tablet     Respiratory   Acute non-recurrent sinusitis - Primary    Started empiric Augmentin considering persistent symptoms Cheratussin as needed for cough Advised to perform warm water gargling or use phenol/Oralseptic throat spray as needed for sore throat Can use nasal saline spray or Flonase  for nasal congestion/allergies      Relevant Medications   amoxicillin-clavulanate (AUGMENTIN) 875-125 MG tablet   guaiFENesin-codeine (ROBITUSSIN AC) 100-10 MG/5ML syrup     Meds ordered this encounter  Medications   amoxicillin-clavulanate (AUGMENTIN) 875-125 MG tablet    Sig: Take 1 tablet by mouth 2 (two) times daily.    Dispense:   14 tablet    Refill:  0   guaiFENesin-codeine (ROBITUSSIN AC) 100-10 MG/5ML syrup    Sig: Take 5 mLs by mouth 3 (three) times daily as needed for cough.    Dispense:  120 mL    Refill:  0   apixaban (ELIQUIS) 5 MG TABS tablet    Sig: Take 1 tablet (5 mg total) by mouth 2 (two) times daily.    Dispense:  60 tablet    Refill:  5     Tonette Koehne Concha Se, MD

## 2023-06-09 ENCOUNTER — Ambulatory Visit: Payer: Medicaid Other | Admitting: Internal Medicine

## 2023-06-09 ENCOUNTER — Encounter: Payer: Self-pay | Admitting: Internal Medicine

## 2023-06-09 VITALS — BP 137/70 | HR 71 | Ht 71.0 in | Wt 226.2 lb

## 2023-06-09 DIAGNOSIS — Z794 Long term (current) use of insulin: Secondary | ICD-10-CM | POA: Diagnosis not present

## 2023-06-09 DIAGNOSIS — E1169 Type 2 diabetes mellitus with other specified complication: Secondary | ICD-10-CM | POA: Diagnosis not present

## 2023-06-09 DIAGNOSIS — I10 Essential (primary) hypertension: Secondary | ICD-10-CM | POA: Diagnosis not present

## 2023-06-09 DIAGNOSIS — I483 Typical atrial flutter: Secondary | ICD-10-CM

## 2023-06-09 MED ORDER — LOSARTAN POTASSIUM 50 MG PO TABS: 50.0000 mg | ORAL_TABLET | Freq: Every day | ORAL | 1 refills | Status: DC

## 2023-06-09 NOTE — Progress Notes (Signed)
Established Patient Office Visit  Subjective:  Patient ID: Jeremy Burgess, male    DOB: Dec 31, 1965  Age: 57 y.o. MRN: [id number]  CC:  Chief Complaint  Patient presents with   Hypertension    Four week follow up    Diabetes    Four week follow up     HPI Jeremy Burgess is a 57 y.o. 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  Type II DM: His HbA1c was 8.1 in 09/24.  He takes Janumet 50-1000 mg twice daily. He has started taking glipizide 2.5 mg QD now.  He denies any polyuria or polydipsia or polyphagia. His blood glucose ranges around 100-150 mostly, which have improved since adding glipizide at a lower dose.  He had hypoglycemia with glipizide 5 mg twice daily dosing in the past.   HTN: BP is wnl today. Takes amlodipine 10 mg QD and losartan 50 mg QD regularly. He takes chlorthalidone 12.5 mg every other day as well.  Patient denies headache, dizziness, chest pain, dyspnea or palpitations.    Past Medical History:  Diagnosis Date   Accidentally struck by falling tree 02/08/2015   Acute blood loss anemia 02/09/2015   Acute respiratory failure (HCC) 02/09/2015   Acute respiratory failure (HCC) 02/09/2015   Ankle fracture, right 02/09/2015   Arthritis    Asthma    Atrial flutter (HCC)    a. diagnosed in 08/2018 b. s/p DCCV in 09/2018 with return to NSR   Chronic pain due to trauma    Closed fracture of right ulna 02/08/2015   Closed right pilon fracture 02/23/2015   Diabetes mellitus    Diabetes mellitus (HCC)    Femur fracture, right (HCC) 02/08/2015   Heart murmur    History of blood clots    DVT   Hypertension    Injury of hand, extensor tendon, right, sequela 09/01/2018   Pneumothorax    Postlaminectomy syndrome, lumbar region 11/13/2015   Rib fracture    Thrombocytopenia (HCC) 02/09/2015   Traumatic closed  displaced fracture of multiple ribs of right side 02/08/2015   Traumatic hemopneumothorax 02/09/2015   Upper GI bleed 05/26/2016   Upper GI bleeding 05/27/2016   Trivial. EGD unremarkable.    Past Surgical History:  Procedure Laterality Date   BACK SURGERY     CARDIOVERSION N/A 10/06/2018   Procedure: CARDIOVERSION;  Surgeon: Jonelle Sidle, MD;  Location: [facility];  Service: Cardiovascular;  Laterality: N/A;   CHOLECYSTECTOMY N/A 10/30/2016   Procedure: LAPAROSCOPIC CHOLECYSTECTOMY;  Surgeon: Franky Macho, MD;  Location: [facility];  Service: General;  Laterality: N/A;   COLONOSCOPY N/A 07/25/2016   Dr. Jena Gauss: 9 mm tubular adenoma. 5 year surveillance    ESOPHAGOGASTRODUODENOSCOPY  2009   EGD completed by Dr. Jena Gauss due to hematemesis. Findings of normal esophagus, tiny antral erosions, normal D1, D2   ESOPHAGOGASTRODUODENOSCOPY N/A 05/27/2016   Dr. Jena Gauss: normal    EXTERNAL FIXATION LEG Right 02/09/2015   Procedure: EXTERNAL FIXATION LEG;  Surgeon: Sheral Apley, MD;  Location: [facility] OR;  Service: Orthopedics;  Laterality: Right;   EXTERNAL FIXATION REMOVAL Right 02/14/2015   Procedure: REMOVAL EXTERNAL FIXATION LEG;  Surgeon: Sheral Apley, MD;  Location: [facility];  Service: Orthopedics;  Laterality: Right;   FEMUR IM NAIL Right 02/09/2015   Procedure: INTRAMEDULLARY (IM) RETROGRADE FEMORAL  NAILING;  Surgeon: Sheral Apley, MD;  Location: [facility] OR;  Service: Orthopedics;  Laterality: Right;   NOSE SURGERY     ORIF ANKLE FRACTURE Right 02/14/2015   Procedure: OPEN REDUCTION INTERNAL FIXATION (ORIF) PILON FRACTURE;  Surgeon: Sheral Apley, MD;  Location: [facility];  Service: Orthopedics;  Laterality: Right;   ORIF ULNAR FRACTURE Right 02/09/2015   Procedure: OPEN REDUCTION INTERNAL FIXATION (ORIF) ULNAR FRACTURE;  Surgeon: Sheral Apley, MD;  Location: [facility];  Service: Orthopedics;  Laterality: Right;   POLYPECTOMY  07/25/2016   Procedure: POLYPECTOMY;  Surgeon: Corbin Ade, MD;   Location: [facility];  Service: Endoscopy;;  colon   TOOTH EXTRACTION  04/2023    Family History  Problem Relation Age of Onset   Diabetes Mother    COPD Mother    CAD Father        CABG in his 89's   Diabetes Sister    Atrial fibrillation Brother    Colon cancer Neg Hx     Social History   Socioeconomic History   Marital status: Single    Spouse name: Not on file   Number of children: Not on file   Years of education: Not on file   Highest education level: Not on file  Occupational History   Not on file  Tobacco Use   Smoking status: Never   Smokeless tobacco: Never  Vaping Use   Vaping status: Never Used  Substance and Sexual Activity   Alcohol use: No    Comment: history of alcohol abuse but quit in 2008    Drug use: No   Sexual activity: Yes  Other Topics Concern   Not on file  Social History Narrative       Single.Lives alone.On disability secondary to tree fall accident 2016.   Social Determinants of Health   Financial Resource Strain: Medium Risk (06/27/2020)   Overall Financial Resource Strain (CARDIA)    Difficulty of Paying Living Expenses: Somewhat hard  Food Insecurity: Not on file  Transportation Needs: No Transportation Needs (08/30/2022)   PRAPARE - Administrator, Civil Service (Medical): No    Lack of Transportation (Non-Medical): No  Physical Activity: Not on file  Stress: Not on file  Social Connections: Not on file  Intimate Partner Violence: Not on file    Outpatient Medications Prior to Visit  Medication Sig Dispense Refill   amLODipine (NORVASC) 10 MG tablet Take 1 tablet (10 mg total) by mouth daily. 90 tablet 3   apixaban (ELIQUIS) 5 MG TABS tablet Take 1 tablet (5 mg total) by mouth 2 (two) times daily. 60 tablet 5   atorvastatin (LIPITOR) 10 MG tablet Take 1 tablet (10 mg total) by mouth daily. 90 tablet 3   BD INSULIN SYRINGE U/F 31G X 5/16" 1 ML MISC USE AS DIRECTED 100 each 0   chlorthalidone (HYGROTON) 25 MG  tablet Take 0.5 tablets (12.5 mg total) by mouth every other day. 22 tablet 1   Cholecalciferol (VITAMIN D-3) 125 MCG (5000 UT) TABS Take 15,000 mg by mouth once a week.     gabapentin (NEURONTIN) 100 MG capsule Take 1 capsule (100 mg total) by mouth at bedtime. 30 capsule 1   glipiZIDE (GLUCOTROL XL) 2.5 MG 24 hr tablet Take 1 tablet (2.5 mg total) by mouth daily with breakfast. 90 tablet 1   glucose blood (ACCU-CHEK GUIDE) test strip USE 1 STRIP TO CHECK GLUCOSE TWICE DAILY 100 each 11  guaiFENesin-codeine (ROBITUSSIN AC) 100-10 MG/5ML syrup Take 5 mLs by mouth 3 (three) times daily as needed for cough. 120 mL 0   JANUMET XR 50-1000 MG TB24 Take 2 tablets by mouth daily. 180 tablet 3   magnesium oxide (MAG-OX) 400 MG tablet Take 1 tablet (400 mg total) by mouth 2 (two) times daily for 5 days, THEN 1 tablet (400 mg total) daily. (Patient taking differently: 400mg  QD) 90 tablet 3   oxyCODONE-acetaminophen (PERCOCET/ROXICET) 5-325 MG tablet Take 1 tablet by mouth every 6 (six) hours as needed for severe pain. (Patient not taking: Reported on 04/23/2023) 10 tablet 0   amoxicillin-clavulanate (AUGMENTIN) 875-125 MG tablet Take 1 tablet by mouth 2 (two) times daily. 14 tablet 0   losartan (COZAAR) 100 MG tablet Take 0.5 tablets (50 mg total) by mouth daily.     No facility-administered medications prior to visit.    Allergies  Allergen Reactions   Bee Venom Anaphylaxis    HONEY BEES   Lisinopril Cough    ROS Review of Systems  Constitutional:  Negative for chills and fever.  HENT:  Negative for congestion and sore throat.   Eyes:  Negative for pain and discharge.  Respiratory:  Negative for cough and shortness of breath.   Cardiovascular:  Negative for chest pain and palpitations.  Gastrointestinal:  Negative for constipation, diarrhea, nausea and vomiting.  Endocrine: Negative for polydipsia and polyuria.  Genitourinary:  Negative for dysuria and hematuria.  Musculoskeletal:  Positive  for arthralgias and back pain. Negative for neck pain and neck stiffness.  Skin:  Negative for rash.  Neurological:  Positive for numbness. Negative for dizziness, weakness and headaches.  Psychiatric/Behavioral:  Negative for agitation and behavioral problems.       Objective:    Physical Exam Vitals reviewed.  Constitutional:      General: He is not in acute distress.    Appearance: He is obese. He is not diaphoretic.  HENT:     Head: Normocephalic and atraumatic.     Nose: Congestion present.     Mouth/Throat:     Mouth: Mucous membranes are moist.  Eyes:     General: No scleral icterus.    Extraocular Movements: Extraocular movements intact.  Cardiovascular:     Rate and Rhythm: Normal rate and regular rhythm.     Pulses: Normal pulses.     Heart sounds: Normal heart sounds. No murmur heard. Pulmonary:     Breath sounds: Normal breath sounds. No wheezing or rales.  Musculoskeletal:     Cervical back: Neck supple. No tenderness.     Right lower leg: No edema.     Left lower leg: No edema.  Skin:    General: Skin is warm.     Findings: No rash.  Neurological:     General: No focal deficit present.     Mental Status: He is alert and oriented to person, place, and time.     Sensory: Sensory deficit (LUE, LLE) present.     Motor: No weakness.  Psychiatric:        Mood and Affect: Mood normal.        Behavior: Behavior normal.     BP 137/70 (BP Location: Right Arm, Patient Position: Sitting, Cuff Size: Large)   Pulse 71   Ht 5\' 11"  (1.803 m)   Wt 226 lb 3.2 oz (102.6 kg)   SpO2 94%   BMI 31.55 kg/m  Wt Readings from Last 3 Encounters:  06/09/23 226 lb 3.2  oz (102.6 kg)  05/30/23 223 lb (101.2 kg)  05/08/23 225 lb 3.2 oz (102.2 kg)    Lab Results  Component Value Date   TSH 3.140 05/08/2023   Lab Results  Component Value Date   WBC 10.1 05/08/2023   HGB 13.7 05/08/2023   HCT 43.5 05/08/2023   MCV 88 05/08/2023   PLT 254 05/08/2023   Lab Results   Component Value Date   NA 139 05/08/2023   K 4.4 05/08/2023   CO2 23 05/08/2023   GLUCOSE 148 (H) 05/08/2023   BUN 18 05/08/2023   CREATININE 1.15 05/08/2023   BILITOT 0.3 05/08/2023   ALKPHOS 92 05/08/2023   AST 21 05/08/2023   ALT 16 05/08/2023   PROT 7.4 05/08/2023   ALBUMIN 4.6 05/08/2023   CALCIUM 10.1 05/08/2023   ANIONGAP 7 01/20/2023   EGFR 73 05/08/2023   Lab Results  Component Value Date   CHOL 116 05/08/2023   Lab Results  Component Value Date   HDL 51 05/08/2023   Lab Results  Component Value Date   LDLCALC 49 05/08/2023   Lab Results  Component Value Date   TRIG 77 05/08/2023   Lab Results  Component Value Date   CHOLHDL 2.3 05/08/2023   Lab Results  Component Value Date   HGBA1C 8.1 (H) 05/08/2023      Assessment & Plan:   Problem List Items Addressed This Visit       Cardiovascular and Mediastinum   Essential hypertension - Primary    BP Readings from Last 1 Encounters:  06/09/23 137/70   Well-controlled with amlodipine 10 mg QD now and losartan 50 mg QD Had hypotension with losartan 100 mg QD, reduced dose of losartan to 50 mg QD Also takes chlorthalidone every other day Counseled for compliance with the medications Advised DASH diet and moderate exercise/walking, at least 150 mins/week      Relevant Medications   losartan (COZAAR) 50 MG tablet   Typical atrial flutter (HCC)    During hospitalization in 2020 Was cardioverted On Eliquis - refilled Had bradycardia with Cardizem Followed by Cardiology      Relevant Medications   losartan (COZAAR) 50 MG tablet     Endocrine   Type 2 diabetes mellitus with other specified complication (HCC)    Lab Results  Component Value Date   HGBA1C 8.1 (H) 05/08/2023   Uncontrolled, but improving since adding glipizide at a lower dose Associated with HTN and HLD On Janumet BID and glipizide to 2.5 mg QD He had hypoglycemia with 5 mg BID dosing - he had stopped taking it Advised to  follow diabetic diet On ACEi and statin now F/u CMP and lipid panel Diabetic eye exam: Advised to follow up with Ophthalmology for diabetic eye exam      Relevant Medications   losartan (COZAAR) 50 MG tablet    Meds ordered this encounter  Medications   losartan (COZAAR) 50 MG tablet    Sig: Take 1 tablet (50 mg total) by mouth daily.    Dispense:  90 tablet    Refill:  1    Follow-up: Return in about 3 months (around 09/09/2023) for HTN and DM.    [name], MD

## 2023-06-09 NOTE — Patient Instructions (Signed)
Please continue to take medications as prescribed. ? ?Please continue to follow low carb diet and perform moderate exercise/walking at least 150 mins/week. ?

## 2023-06-09 NOTE — Assessment & Plan Note (Addendum)
Lab Results  Component Value Date   HGBA1C 8.1 (H) 05/08/2023   Uncontrolled, but improving since adding glipizide at a lower dose Associated with HTN and HLD On Janumet BID and glipizide to 2.5 mg QD He had hypoglycemia with 5 mg BID dosing - he had stopped taking it Advised to follow diabetic diet On ACEi and statin now F/u CMP and lipid panel Diabetic eye exam: Advised to follow up with Ophthalmology for diabetic eye exam

## 2023-06-09 NOTE — Assessment & Plan Note (Addendum)
BP Readings from Last 1 Encounters:  06/09/23 137/70   Well-controlled with amlodipine 10 mg QD now and losartan 50 mg QD Had hypotension with losartan 100 mg QD, reduced dose of losartan to 50 mg QD Also takes chlorthalidone every other day Counseled for compliance with the medications Advised DASH diet and moderate exercise/walking, at least 150 mins/week

## 2023-06-13 NOTE — Assessment & Plan Note (Signed)
During hospitalization in 2020 Was cardioverted On Eliquis - refilled Had bradycardia with Cardizem Followed by Cardiology

## 2023-08-09 ENCOUNTER — Emergency Department (HOSPITAL_COMMUNITY)
Admission: EM | Admit: 2023-08-09 | Discharge: 2023-08-09 | Disposition: A | Discharge status: Home / Self Care | Payer: Medicaid Other | Attending: Emergency Medicine | Admitting: Emergency Medicine

## 2023-08-09 ENCOUNTER — Emergency Department (HOSPITAL_COMMUNITY): Payer: Medicaid Other

## 2023-08-09 ENCOUNTER — Other Ambulatory Visit: Payer: Self-pay

## 2023-08-09 DIAGNOSIS — M1711 Unilateral primary osteoarthritis, right knee: Secondary | ICD-10-CM | POA: Diagnosis not present

## 2023-08-09 DIAGNOSIS — S86911A Strain of unspecified muscle(s) and tendon(s) at lower leg level, right leg, initial encounter: Secondary | ICD-10-CM

## 2023-08-09 DIAGNOSIS — X501XXA Overexertion from prolonged static or awkward postures, initial encounter: Secondary | ICD-10-CM | POA: Diagnosis not present

## 2023-08-09 DIAGNOSIS — Y9301 Activity, walking, marching and hiking: Secondary | ICD-10-CM | POA: Diagnosis not present

## 2023-08-09 DIAGNOSIS — M25461 Effusion, right knee: Secondary | ICD-10-CM | POA: Insufficient documentation

## 2023-08-09 DIAGNOSIS — S86111A Strain of other muscle(s) and tendon(s) of posterior muscle group at lower leg level, right leg, initial encounter: Secondary | ICD-10-CM | POA: Insufficient documentation

## 2023-08-09 DIAGNOSIS — Z7901 Long term (current) use of anticoagulants: Secondary | ICD-10-CM | POA: Insufficient documentation

## 2023-08-09 DIAGNOSIS — M25561 Pain in right knee: Secondary | ICD-10-CM | POA: Diagnosis not present

## 2023-08-09 MED ORDER — CELECOXIB 100 MG PO CAPS: 100.0000 mg | ORAL_CAPSULE | Freq: Two times a day (BID) | ORAL | 0 refills | Status: DC

## 2023-08-09 MED ORDER — NAPROXEN 250 MG PO TABS
500.0000 mg | ORAL_TABLET | Freq: Once | ORAL | Status: AC
  Administered 2023-08-09: 500 mg via ORAL
  Filled 2023-08-09: qty 2

## 2023-08-09 NOTE — ED Provider Notes (Signed)
[place] [facility] Provider Note   CSN: [id number] Arrival date & time: 08/09/23  5366     History  Chief Complaint  Patient presents with   Knee Pain    Jeremy Burgess is a 57 y.o. male.   Knee Pain Patient is a 57 year old male presenting to the hospital with a complaint of knee pain, he states that he fell on Monday, at that time the patient states he was walking with his cane when he stepped in a pothole and felt like his right knee twisted causing some pain behind the knee, he has had some swelling since that time and states it is tender to the touch or when he tries to roll over in the bed the different positions can cause pain.  It is better and almost pain-free when he is laying flat on his back.  He is on Eliquis chronically.  He has not been seen by his doctor for this, he does have a history of a prior intramedullary rod of his right femur and injury to his right ankle requiring plating, that was years ago     Home Medications Prior to Admission medications   Medication Sig Start Date End Date Taking? Authorizing Provider  amLODipine (NORVASC) 10 MG tablet Take 1 tablet (10 mg total) by mouth daily. 12/18/22   [name], MD  apixaban (ELIQUIS) 5 MG TABS tablet Take 1 tablet (5 mg total) by mouth 2 (two) times daily. 05/30/23   [name], MD  atorvastatin (LIPITOR) 10 MG tablet Take 1 tablet (10 mg total) by mouth daily. 12/18/22   [name], MD  BD INSULIN SYRINGE U/F 31G X 5/16" 1 ML MISC USE AS DIRECTED 12/03/22   [name], MD  celecoxib (CELEBREX) 100 MG capsule Take 1 capsule (100 mg total) by mouth 2 (two) times daily. 08/09/23   Eber Hong, MD  chlorthalidone (HYGROTON) 25 MG tablet Take 0.5 tablets (12.5 mg total) by mouth every other day. 05/08/23   [name], MD  Cholecalciferol (VITAMIN D-3) 125 MCG (5000 UT) TABS Take 15,000 mg by mouth once a week.    [provider]  gabapentin  (NEURONTIN) 100 MG capsule Take 1 capsule (100 mg total) by mouth at bedtime. 05/08/23   [name], MD  glipiZIDE (GLUCOTROL XL) 2.5 MG 24 hr tablet Take 1 tablet (2.5 mg total) by mouth daily with breakfast. 05/08/23   [name], MD  glucose blood (ACCU-CHEK GUIDE) test strip USE 1 STRIP TO CHECK GLUCOSE TWICE DAILY 05/08/23   [name], MD  guaiFENesin-codeine (ROBITUSSIN AC) 100-10 MG/5ML syrup Take 5 mLs by mouth 3 (three) times daily as needed for cough. 05/30/23   [name], MD  JANUMET XR 50-1000 MG TB24 Take 2 tablets by mouth daily. 03/11/23   [name], MD  losartan (COZAAR) 50 MG tablet Take 1 tablet (50 mg total) by mouth daily. 06/09/23   [name], MD  magnesium oxide (MAG-OX) 400 MG tablet Take 1 tablet (400 mg total) by mouth 2 (two) times daily for 5 days, THEN 1 tablet (400 mg total) daily. Patient taking differently: 400mg  QD 10/15/22 10/15/23  Antoine Poche, MD  oxyCODONE-acetaminophen (PERCOCET/ROXICET) 5-325 MG tablet Take 1 tablet by mouth every 6 (six) hours as needed for severe pain. Patient not taking: Reported on 04/23/2023 02/09/23   Terrilee Files, MD      Allergies  Bee venom and Lisinopril    Review of Systems   Review of Systems  All other systems reviewed and are negative.   Physical Exam Updated Vital Signs BP (!) 140/63   Pulse 71   Temp 98.5 F (36.9 C) (Oral)   Resp 16   Ht 1.803 m (5\' 11" )   Wt 108.9 kg   SpO2 98%   BMI 33.47 kg/m  Physical Exam Vitals and nursing note reviewed.  Constitutional:      General: He is not in acute distress.    Appearance: He is well-developed.  HENT:     Head: Normocephalic and atraumatic.     Mouth/Throat:     Pharynx: No oropharyngeal exudate.  Eyes:     General: No scleral icterus.       Right eye: No discharge.        Left eye: No discharge.     Conjunctiva/sclera: Conjunctivae normal.     Pupils: Pupils are equal, round, and reactive to light.  Neck:      Thyroid: No thyromegaly.     Vascular: No JVD.  Cardiovascular:     Rate and Rhythm: Normal rate and regular rhythm.     Heart sounds: Normal heart sounds. No murmur heard.    No friction rub. No gallop.  Pulmonary:     Effort: Pulmonary effort is normal. No respiratory distress.     Breath sounds: Normal breath sounds. No wheezing or rales.  Abdominal:     General: Bowel sounds are normal. There is no distension.     Palpations: Abdomen is soft. There is no mass.     Tenderness: There is no abdominal tenderness.  Musculoskeletal:        General: Swelling and tenderness present. Normal range of motion.     Cervical back: Normal range of motion and neck supple.     Right lower leg: Edema present.     Left lower leg: No edema.     Comments: The patient has good range of motion of the right knee though there is a clinical effusion that is palpated, he has no obvious bony tenderness, he can straight leg raise and flex his knee to 90 degrees.  He has mild edema of the right leg compared to the left  Lymphadenopathy:     Cervical: No cervical adenopathy.  Skin:    General: Skin is warm and dry.     Findings: No erythema or rash.  Neurological:     Mental Status: He is alert.     Coordination: Coordination normal.  Psychiatric:        Behavior: Behavior normal.     ED Results / Procedures / Treatments   Labs (all labs ordered are listed, but only abnormal results are displayed) Labs Reviewed - No data to display  EKG None  Radiology DG Knee Complete 4 Views Right Result Date: 08/09/2023 CLINICAL DATA:  Right knee pain after twisting injury several days ago. EXAM: RIGHT KNEE - COMPLETE 4+ VIEW COMPARISON:  August 28, 2022. FINDINGS: Status post intramedullary rod fixation of right femur. No fracture or dislocation is noted. Minimal narrowing of medial joint space is noted. Mild patellar spurring. Minimal suprapatellar joint effusion. IMPRESSION: Postsurgical and degenerative  changes as noted above. No fracture or dislocation is noted Electronically Signed   By: Lupita Raider M.D.   On: 08/09/2023 09:32    Procedures Procedures    Medications Ordered in ED Medications  naproxen (NAPROSYN) tablet 500  mg (500 mg Oral Given 08/09/23 1610)    ED Course/ Medical Decision Making/ A&P                                 Medical Decision Making Amount and/or Complexity of Data Reviewed Radiology: ordered.  Risk Prescription drug management.   This patient is in no distress his vital signs are unremarkable, he is not tachycardic or febrile, he has a clinical effusion after a strain of his knee and I suspect he has a hemarthrosis.  Will obtain imaging to make sure there is no signs of bony abnormalities, he was given Naprosyn for pain and I will offer him a knee immobilizer as well.  The patient is agreeable, he is ambulatory  Imaging: I personally viewed and interpret the x-ray of the right knee, there is no signs of fractures or dislocations, there is a slight effusion, postsurgical changes present, hardware present.  Medication management: Home with 7 to 10 days of Celebrex as needed and follow-up with orthopedics  I have discussed with the patient at the bedside the results, and the meaning of these results.  They have had opportunity to ask questions,  expressed their understanding to the need for follow-up with primary care physician        Final Clinical Impression(s) / ED Diagnoses Final diagnoses:  Knee strain, right, initial encounter  Effusion of knee joint right    Rx / DC Orders ED Discharge Orders          Ordered    celecoxib (CELEBREX) 100 MG capsule  2 times daily,   Status:  Discontinued        08/09/23 0958    celecoxib (CELEBREX) 100 MG capsule  2 times daily        08/09/23 0959              Eber Hong, MD 08/09/23 [phone]

## 2023-08-09 NOTE — Discharge Instructions (Signed)
You can see your orthopedic surgeon as needed if this is no better in 1 week but there is likely some blood in your knee that will gradually heal.  You can wear the knee brace as we have given you, use your cane, I have also given you 1 week of Celebrex, you can take this twice a day as needed but do not take it for any longer than the 7 to 10 days.

## 2023-08-09 NOTE — ED Triage Notes (Signed)
Pt twisted his right knee while walking on Monday. Pt c/o pain under his knee with swelling down to his ankle.

## 2023-09-09 ENCOUNTER — Ambulatory Visit: Payer: Medicaid Other | Admitting: Internal Medicine

## 2023-09-09 ENCOUNTER — Encounter: Payer: Self-pay | Admitting: Internal Medicine

## 2023-09-09 VITALS — BP 128/72 | HR 68 | Ht 71.0 in | Wt 229.2 lb

## 2023-09-09 DIAGNOSIS — I10 Essential (primary) hypertension: Secondary | ICD-10-CM

## 2023-09-09 DIAGNOSIS — E1169 Type 2 diabetes mellitus with other specified complication: Secondary | ICD-10-CM

## 2023-09-09 DIAGNOSIS — M5416 Radiculopathy, lumbar region: Secondary | ICD-10-CM

## 2023-09-09 DIAGNOSIS — S161XXA Strain of muscle, fascia and tendon at neck level, initial encounter: Secondary | ICD-10-CM | POA: Diagnosis not present

## 2023-09-09 DIAGNOSIS — I483 Typical atrial flutter: Secondary | ICD-10-CM | POA: Diagnosis not present

## 2023-09-09 MED ORDER — CHLORTHALIDONE 25 MG PO TABS: 12.5000 mg | ORAL_TABLET | ORAL | 5 refills | Status: DC

## 2023-09-09 MED ORDER — GABAPENTIN 100 MG PO CAPS: 100.0000 mg | ORAL_CAPSULE | Freq: Every day | ORAL | 1 refills | Status: DC

## 2023-09-09 MED ORDER — CYCLOBENZAPRINE HCL 5 MG PO TABS: 5.0000 mg | ORAL_TABLET | Freq: Two times a day (BID) | ORAL | 0 refills | Status: DC | PRN

## 2023-09-09 NOTE — Assessment & Plan Note (Addendum)
BP Readings from Last 1 Encounters:  09/09/23 128/72   Well-controlled with amlodipine 10 mg QD now and losartan 50 mg QD Had hypotension with losartan 100 mg QD, reduced dose of losartan to 50 mg later Also takes chlorthalidone every other day Counseled for compliance with the medications Advised DASH diet and moderate exercise/walking, at least 150 mins/week

## 2023-09-09 NOTE — Assessment & Plan Note (Signed)
Has postlaminectomy syndrome Has radiating pain towards LLE Unable to take oral NSAID as he takes Eliquis Continue gabapentin 100 mg nightly considering his neuropathic pain

## 2023-09-09 NOTE — Assessment & Plan Note (Signed)
Lab Results  Component Value Date   HGBA1C 8.1 (H) 05/08/2023   Uncontrolled, but improving since adding glipizide at a lower dose Associated with HTN and HLD On Janumet BID and glipizide 2.5 mg QD Advised to follow diabetic diet On ACEi and statin now F/u CMP and lipid panel Diabetic eye exam: Advised to follow up with Ophthalmology for diabetic eye exam

## 2023-09-09 NOTE — Assessment & Plan Note (Signed)
During hospitalization in 2020 Was cardioverted On Eliquis - refilled Had bradycardia with Cardizem Followed by Cardiology
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
## 2023-09-09 NOTE — Patient Instructions (Signed)
Please take Flexeril as needed for neck muscle spasms. Okay to apply heating pad or Icyhot/Bengay for neck pain.  Please continue to take medications as prescribed.  Please continue to follow low carb diet and perform moderate exercise/walking at least 150 mins/week.

## 2023-09-10 DIAGNOSIS — S161XXA Strain of muscle, fascia and tendon at neck level, initial encounter: Secondary | ICD-10-CM | POA: Insufficient documentation

## 2023-09-10 LAB — BAYER DCA HB A1C WAIVED: HB A1C (BAYER DCA - WAIVED): 6.4 % — ABNORMAL HIGH (ref 4.8–5.6)

## 2023-09-10 NOTE — Assessment & Plan Note (Addendum)
Acute onset neck pain likely due to muscular strain Unable to take oral NSAIDs due to current anticoagulation Flexeril as needed for muscle spasms Avoid overhead heavy lifting Advised to apply warm compresses and try massaging

## 2023-10-14 ENCOUNTER — Other Ambulatory Visit: Payer: Self-pay | Admitting: Cardiology

## 2023-11-16 ENCOUNTER — Other Ambulatory Visit: Payer: Self-pay | Admitting: Internal Medicine

## 2023-11-16 DIAGNOSIS — E1169 Type 2 diabetes mellitus with other specified complication: Secondary | ICD-10-CM

## 2023-12-08 ENCOUNTER — Other Ambulatory Visit: Payer: Self-pay | Admitting: Internal Medicine

## 2023-12-08 DIAGNOSIS — I10 Essential (primary) hypertension: Secondary | ICD-10-CM

## 2023-12-18 ENCOUNTER — Telehealth: Payer: Self-pay | Admitting: Internal Medicine

## 2023-12-18 ENCOUNTER — Other Ambulatory Visit: Payer: Self-pay | Admitting: Internal Medicine

## 2023-12-18 DIAGNOSIS — I483 Typical atrial flutter: Secondary | ICD-10-CM

## 2023-12-18 MED ORDER — APIXABAN 5 MG PO TABS: 5.0000 mg | ORAL_TABLET | Freq: Two times a day (BID) | ORAL | 11 refills | Status: AC

## 2023-12-18 NOTE — Telephone Encounter (Signed)
 Prescription Request  12/18/2023  LOV: 09/09/2023  What is the name of the medication or equipment? apixaban  (ELIQUIS ) 5 MG TABS tablet [[id number]]   Have you contacted your pharmacy to request a refill? Yes   Which pharmacy would you like this sent to?  [facility]  Patient notified that their request is being sent to the clinical staff for review and that they should receive a response within 2 business days.   Please advise at walked into office

## 2024-01-07 ENCOUNTER — Ambulatory Visit: Payer: Medicaid Other | Admitting: Internal Medicine

## 2024-01-07 ENCOUNTER — Encounter: Payer: Self-pay | Admitting: Internal Medicine

## 2024-01-07 VITALS — BP 136/70 | HR 66 | Ht 71.0 in | Wt 229.8 lb

## 2024-01-07 DIAGNOSIS — Z7901 Long term (current) use of anticoagulants: Secondary | ICD-10-CM

## 2024-01-07 DIAGNOSIS — E1169 Type 2 diabetes mellitus with other specified complication: Secondary | ICD-10-CM | POA: Diagnosis not present

## 2024-01-07 DIAGNOSIS — I10 Essential (primary) hypertension: Secondary | ICD-10-CM

## 2024-01-07 DIAGNOSIS — Z794 Long term (current) use of insulin: Secondary | ICD-10-CM | POA: Diagnosis not present

## 2024-01-07 DIAGNOSIS — M5416 Radiculopathy, lumbar region: Secondary | ICD-10-CM | POA: Diagnosis not present

## 2024-01-07 DIAGNOSIS — E782 Mixed hyperlipidemia: Secondary | ICD-10-CM | POA: Diagnosis not present

## 2024-01-07 DIAGNOSIS — I483 Typical atrial flutter: Secondary | ICD-10-CM

## 2024-01-07 MED ORDER — CHLORTHALIDONE 25 MG PO TABS: 12.5000 mg | ORAL_TABLET | Freq: Every day | ORAL | 1 refills | Status: AC

## 2024-01-07 MED ORDER — LOSARTAN POTASSIUM 50 MG PO TABS: 50.0000 mg | ORAL_TABLET | Freq: Every day | ORAL | 3 refills | Status: AC

## 2024-01-07 MED ORDER — JANUMET XR 50-1000 MG PO TB24: 2.0000 | ORAL_TABLET | Freq: Every day | ORAL | 3 refills | Status: AC

## 2024-01-07 MED ORDER — AMLODIPINE BESYLATE 10 MG PO TABS: 10.0000 mg | ORAL_TABLET | Freq: Every day | ORAL | 3 refills | Status: AC

## 2024-01-07 MED ORDER — GLIPIZIDE ER 2.5 MG PO TB24: 2.5000 mg | ORAL_TABLET | Freq: Every day | ORAL | 3 refills | Status: AC

## 2024-01-07 MED ORDER — ATORVASTATIN CALCIUM 10 MG PO TABS: 10.0000 mg | ORAL_TABLET | Freq: Every day | ORAL | 3 refills | Status: AC

## 2024-01-07 NOTE — Assessment & Plan Note (Addendum)
 Has postlaminectomy syndrome Has radiating pain towards LLE Unable to take oral NSAID as he takes Eliquis  Continue gabapentin  100 mg nightly considering his neuropathic pain, takes it inconsistently

## 2024-01-07 NOTE — Assessment & Plan Note (Signed)
 During hospitalization in 2020 Was cardioverted On Eliquis  5 mg BID Had bradycardia with Cardizem  Followed by Cardiology

## 2024-01-07 NOTE — Progress Notes (Signed)
 Established Patient Office Visit  Subjective:  Patient ID: Jeremy Burgess, male    DOB: 1966/02/25  Age: 58 y.o. MRN: [id number]  CC:  Chief Complaint  Patient presents with   Medical Management of Chronic Issues    4 month f/u    HPI Jeremy Burgess is a 58 y.o. 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  Type II DM: His HbA1c was 6.4 in 01/25.  He takes Janumet  50-1000 mg twice daily and glipizide  2.5 mg QD now.  He denies any polyuria or polydipsia or polyphagia. His blood glucose ranges around 100-150 mostly, which have improved since adding glipizide  at a lower dose.  He had hypoglycemia with glipizide  5 mg twice daily dosing in the past.  HTN: BP is borderline elevated today. Takes amlodipine  10 mg QD and losartan  50 mg QD regularly. He takes chlorthalidone  12.5 mg every other day as well.  Patient denies headache, dizziness, chest pain, dyspnea or palpitations.   Past Medical History:  Diagnosis Date   Accidentally struck by falling tree 02/08/2015   Acute blood loss anemia 02/09/2015   Acute respiratory failure (HCC) 02/09/2015   Acute respiratory failure (HCC) 02/09/2015   Ankle fracture, right 02/09/2015   Arthritis    Asthma    Atrial flutter (HCC)    a. diagnosed in 08/2018 b. s/p DCCV in 09/2018 with return to NSR   Chronic pain due to trauma    Closed fracture of right ulna 02/08/2015   Closed right pilon fracture 02/23/2015   Diabetes mellitus    Diabetes mellitus (HCC)    Femur fracture, right (HCC) 02/08/2015   Heart murmur    History of blood clots    DVT   Hypertension    Injury of hand, extensor tendon, right, sequela 09/01/2018   Pneumothorax    Postlaminectomy syndrome, lumbar region 11/13/2015   Rib fracture    Thrombocytopenia (HCC) 02/09/2015   Traumatic closed displaced fracture of  multiple ribs of right side 02/08/2015   Traumatic hemopneumothorax 02/09/2015   Upper GI bleed 05/26/2016   Upper GI bleeding 05/27/2016   Trivial. EGD unremarkable.    Past Surgical History:  Procedure Laterality Date   BACK SURGERY     CARDIOVERSION N/A 10/06/2018   Procedure: CARDIOVERSION;  Surgeon: Gerard Knight, MD;  Location: [facility];  Service: Cardiovascular;  Laterality: N/A;   CHOLECYSTECTOMY N/A 10/30/2016   Procedure: LAPAROSCOPIC CHOLECYSTECTOMY;  Surgeon: Alanda Allegra, MD;  Location: [facility];  Service: General;  Laterality: N/A;   COLONOSCOPY N/A 07/25/2016   Dr. Riley Cheadle: 9 mm tubular adenoma. 5 year surveillance    ESOPHAGOGASTRODUODENOSCOPY  2009   EGD completed by Dr. Riley Cheadle due to hematemesis. Findings of normal esophagus, tiny antral erosions, normal D1, D2   ESOPHAGOGASTRODUODENOSCOPY N/A 05/27/2016   Dr. Riley Cheadle: normal    EXTERNAL FIXATION LEG Right 02/09/2015   Procedure: EXTERNAL FIXATION LEG;  Surgeon: Saundra Curl, MD;  Location: [facility] OR;  Service: Orthopedics;  Laterality: Right;   EXTERNAL FIXATION REMOVAL Right 02/14/2015   Procedure: REMOVAL EXTERNAL FIXATION LEG;  Surgeon: Saundra Curl, MD;  Location: [facility] OR;  Service: Orthopedics;  Laterality: Right;   FEMUR IM NAIL Right 02/09/2015   Procedure: INTRAMEDULLARY (IM) RETROGRADE FEMORAL NAILING;  Surgeon: Saundra Curl, MD;  Location: [facility] OR;  Service:  Orthopedics;  Laterality: Right;   NOSE SURGERY     ORIF ANKLE FRACTURE Right 02/14/2015   Procedure: OPEN REDUCTION INTERNAL FIXATION (ORIF) PILON FRACTURE;  Surgeon: Saundra Curl, MD;  Location: [facility] OR;  Service: Orthopedics;  Laterality: Right;   ORIF ULNAR FRACTURE Right 02/09/2015   Procedure: OPEN REDUCTION INTERNAL FIXATION (ORIF) ULNAR FRACTURE;  Surgeon: Saundra Curl, MD;  Location: [facility] OR;  Service: Orthopedics;  Laterality: Right;   POLYPECTOMY  07/25/2016   Procedure: POLYPECTOMY;  Surgeon: Suzette Espy, MD;  Location: [facility];   Service: Endoscopy;;  colon   TOOTH EXTRACTION  04/2023    Family History  Problem Relation Age of Onset   Diabetes Mother    COPD Mother    CAD Father        CABG in his 55's   Diabetes Sister    Atrial fibrillation Brother    Colon cancer Neg Hx     Social History   Socioeconomic History   Marital status: Single    Spouse name: Not on file   Number of children: Not on file   Years of education: Not on file   Highest education level: Not on file  Occupational History   Not on file  Tobacco Use   Smoking status: Never   Smokeless tobacco: Never  Vaping Use   Vaping status: Never Used  Substance and Sexual Activity   Alcohol use: No    Comment: history of alcohol abuse but quit in 2008    Drug use: No   Sexual activity: Yes  Other Topics Concern   Not on file  Social History Narrative       Single.Lives alone.On disability secondary to tree fall accident 2016.   Social Drivers of Health   Financial Resource Strain: Medium Risk (06/27/2020)   Overall Financial Resource Strain (CARDIA)    Difficulty of Paying Living Expenses: Somewhat hard  Food Insecurity: Not on file  Transportation Needs: No Transportation Needs (08/30/2022)   PRAPARE - Administrator, Civil Service (Medical): No    Lack of Transportation (Non-Medical): No  Physical Activity: Not on file  Stress: Not on file  Social Connections: Not on file  Intimate Partner Violence: Not on file    Outpatient Medications Prior to Visit  Medication Sig Dispense Refill   apixaban  (ELIQUIS ) 5 MG TABS tablet Take 1 tablet (5 mg total) by mouth 2 (two) times daily. 60 tablet 11   BD INSULIN  SYRINGE U/F 31G X 5/16" 1 ML MISC USE AS DIRECTED 100 each 0   Cholecalciferol (VITAMIN D -3) 125 MCG (5000 UT) TABS Take 15,000 mg by mouth once a week.     cyclobenzaprine  (FLEXERIL ) 5 MG tablet Take 1 tablet (5 mg total) by mouth 2 (two) times daily as needed. 30 tablet 0   gabapentin  (NEURONTIN ) 100 MG  capsule Take 1 capsule (100 mg total) by mouth at bedtime. 90 capsule 1   glucose blood (ACCU-CHEK GUIDE) test strip USE 1 STRIP TO CHECK GLUCOSE TWICE DAILY 100 each 11   MAGnesium -Oxide 400 (240 Mg) MG tablet TAKE 1 TABLET BY MOUTH TWICE DAILY FOR 5 DAYS THEN 1 DAILY 90 tablet 3   amLODipine  (NORVASC ) 10 MG tablet Take 1 tablet (10 mg total) by mouth daily. 90 tablet 3   atorvastatin  (LIPITOR) 10 MG tablet Take 1 tablet (10 mg total) by mouth daily. 90 tablet 3   chlorthalidone  (HYGROTON ) 25 MG tablet Take 0.5 tablets (12.5 mg  total) by mouth every other day. 22 tablet 5   glipiZIDE  (GLUCOTROL  XL) 2.5 MG 24 hr tablet Take 1 tablet by mouth once daily with breakfast 90 tablet 0   JANUMET  XR 50-1000 MG TB24 Take 2 tablets by mouth daily. 180 tablet 3   losartan  (COZAAR ) 50 MG tablet Take 1 tablet by mouth once daily 90 tablet 0   No facility-administered medications prior to visit.    Allergies  Allergen Reactions   Bee Venom Anaphylaxis    HONEY BEES   Lisinopril  Cough    ROS Review of Systems  Constitutional:  Negative for chills and fever.  HENT:  Negative for congestion and sore throat.   Eyes:  Negative for pain and discharge.  Respiratory:  Negative for cough and shortness of breath.   Cardiovascular:  Negative for chest pain and palpitations.  Gastrointestinal:  Negative for constipation, diarrhea, nausea and vomiting.  Endocrine: Negative for polydipsia and polyuria.  Genitourinary:  Negative for dysuria and hematuria.  Musculoskeletal:  Positive for arthralgias and back pain. Negative for neck stiffness.  Skin:  Negative for rash.  Neurological:  Positive for numbness. Negative for dizziness, weakness and headaches.  Psychiatric/Behavioral:  Negative for agitation and behavioral problems.       Objective:    Physical Exam Vitals reviewed.  Constitutional:      General: He is not in acute distress.    Appearance: He is obese. He is not diaphoretic.  HENT:      Head: Normocephalic and atraumatic.     Nose: Congestion present.     Mouth/Throat:     Mouth: Mucous membranes are moist.  Eyes:     General: No scleral icterus.    Extraocular Movements: Extraocular movements intact.  Cardiovascular:     Rate and Rhythm: Normal rate and regular rhythm.     Heart sounds: Normal heart sounds. No murmur heard. Pulmonary:     Breath sounds: Normal breath sounds. No wheezing or rales.  Musculoskeletal:     Cervical back: Neck supple. No tenderness.     Right lower leg: No edema.     Left lower leg: No edema.  Skin:    General: Skin is warm.     Findings: No rash.  Neurological:     General: No focal deficit present.     Mental Status: He is alert and oriented to person, place, and time.     Sensory: Sensory deficit (LUE, LLE) present.     Motor: No weakness.  Psychiatric:        Mood and Affect: Mood normal.        Behavior: Behavior normal.     BP 136/70 (BP Location: Right Arm)   Pulse 66   Ht 5\' 11"  (1.803 m)   Wt 229 lb 12.8 oz (104.2 kg)   SpO2 97%   BMI 32.05 kg/m  Wt Readings from Last 3 Encounters:  01/07/24 229 lb 12.8 oz (104.2 kg)  09/09/23 229 lb 3.2 oz (104 kg)  08/09/23 240 lb (108.9 kg)    Lab Results  Component Value Date   TSH 3.140 05/08/2023   Lab Results  Component Value Date   WBC 10.1 05/08/2023   HGB 13.7 05/08/2023   HCT 43.5 05/08/2023   MCV 88 05/08/2023   PLT 254 05/08/2023   Lab Results  Component Value Date   NA 139 05/08/2023   K 4.4 05/08/2023   CO2 23 05/08/2023   GLUCOSE 148 (H) 05/08/2023  BUN 18 05/08/2023   CREATININE 1.15 05/08/2023   BILITOT 0.3 05/08/2023   ALKPHOS 92 05/08/2023   AST 21 05/08/2023   ALT 16 05/08/2023   PROT 7.4 05/08/2023   ALBUMIN  4.6 05/08/2023   CALCIUM  10.1 05/08/2023   ANIONGAP 7 01/20/2023   EGFR 73 05/08/2023   Lab Results  Component Value Date   CHOL 116 05/08/2023   Lab Results  Component Value Date   HDL 51 05/08/2023   Lab Results   Component Value Date   LDLCALC 49 05/08/2023   Lab Results  Component Value Date   TRIG 77 05/08/2023   Lab Results  Component Value Date   CHOLHDL 2.3 05/08/2023   Lab Results  Component Value Date   HGBA1C 6.4 (H) 09/09/2023      Assessment & Plan:   Problem List Items Addressed This Visit       Cardiovascular and Mediastinum   Essential hypertension - Primary   BP Readings from Last 1 Encounters:  01/07/24 136/70   Well-controlled with amlodipine  10 mg QD now and losartan  50 mg QD Also takes chlorthalidone  12.5 mg every other day, changed to once daily - check CMP after 2 weeks Counseled for compliance with the medications Advised DASH diet and moderate exercise/walking, at least 150 mins/week      Relevant Medications   chlorthalidone  (HYGROTON ) 25 MG tablet   amLODipine  (NORVASC ) 10 MG tablet   atorvastatin  (LIPITOR) 10 MG tablet   losartan  (COZAAR ) 50 MG tablet   Typical atrial flutter (HCC)   During hospitalization in 2020 Was cardioverted On Eliquis  5 mg BID Had bradycardia with Cardizem  Followed by Cardiology      Relevant Medications   chlorthalidone  (HYGROTON ) 25 MG tablet   amLODipine  (NORVASC ) 10 MG tablet   atorvastatin  (LIPITOR) 10 MG tablet   losartan  (COZAAR ) 50 MG tablet     Endocrine   Type 2 diabetes mellitus with other specified complication (HCC)   Lab Results  Component Value Date   HGBA1C 6.4 (H) 09/09/2023   Well-controlled, improving since adding glipizide  at a lower dose Associated with HTN and HLD On Janumet  50-1000 mg BID and glipizide  2.5 mg QD Advised to follow diabetic diet On ACEi and statin now F/u CMP and lipid panel Diabetic eye exam: Advised to follow up with Ophthalmology for diabetic eye exam      Relevant Medications   atorvastatin  (LIPITOR) 10 MG tablet   losartan  (COZAAR ) 50 MG tablet   JANUMET  XR 50-1000 MG TB24   glipiZIDE  (GLUCOTROL  XL) 2.5 MG 24 hr tablet   Other Relevant Orders   CMP14+EGFR    Hemoglobin A1c     Nervous and Auditory   Lumbar radiculopathy   Has postlaminectomy syndrome Has radiating pain towards LLE Unable to take oral NSAID as he takes Eliquis  Continue gabapentin  100 mg nightly considering his neuropathic pain, takes it inconsistently        Other   Mixed hyperlipidemia   Checked lipid profile On statin      Relevant Medications   chlorthalidone  (HYGROTON ) 25 MG tablet   amLODipine  (NORVASC ) 10 MG tablet   atorvastatin  (LIPITOR) 10 MG tablet   losartan  (COZAAR ) 50 MG tablet   Other Visit Diagnoses       Chronic anticoagulation       Relevant Orders   CBC with Differential/Platelet         Meds ordered this encounter  Medications   chlorthalidone  (HYGROTON ) 25 MG tablet  Sig: Take 0.5 tablets (12.5 mg total) by mouth daily.    Dispense:  45 tablet    Refill:  1   amLODipine  (NORVASC ) 10 MG tablet    Sig: Take 1 tablet (10 mg total) by mouth daily.    Dispense:  90 tablet    Refill:  3   atorvastatin  (LIPITOR) 10 MG tablet    Sig: Take 1 tablet (10 mg total) by mouth daily.    Dispense:  90 tablet    Refill:  3   losartan  (COZAAR ) 50 MG tablet    Sig: Take 1 tablet (50 mg total) by mouth daily.    Dispense:  90 tablet    Refill:  3   JANUMET  XR 50-1000 MG TB24    Sig: Take 2 tablets by mouth daily.    Dispense:  180 tablet    Refill:  3   glipiZIDE  (GLUCOTROL  XL) 2.5 MG 24 hr tablet    Sig: Take 1 tablet (2.5 mg total) by mouth daily with breakfast.    Dispense:  90 tablet    Refill:  3    Follow-up: Return in about 4 months (around 05/09/2024) for Annual physical (after 05/07/24).    Meldon Sport, MD

## 2024-01-07 NOTE — Assessment & Plan Note (Addendum)
 BP Readings from Last 1 Encounters:  01/07/24 136/70   Well-controlled with amlodipine  10 mg QD now and losartan  50 mg QD Also takes chlorthalidone  12.5 mg every other day, changed to once daily - check CMP after 2 weeks Counseled for compliance with the medications Advised DASH diet and moderate exercise/walking, at least 150 mins/week

## 2024-01-07 NOTE — Assessment & Plan Note (Signed)
 Checked lipid profile On statin

## 2024-01-07 NOTE — Assessment & Plan Note (Signed)
 Lab Results  Component Value Date   HGBA1C 8.1 (H) 05/08/2023   Uncontrolled, but improving since adding glipizide at a lower dose Associated with HTN and HLD On Janumet BID and glipizide 2.5 mg QD Advised to follow diabetic diet On ACEi and statin now F/u CMP and lipid panel Diabetic eye exam: Advised to follow up with Ophthalmology for diabetic eye exam

## 2024-01-07 NOTE — Patient Instructions (Signed)
 Please start taking Chlorthalidone  half tablet everyday.  Please continue to take medications as prescribed.  Please continue to follow low carb diet and perform moderate exercise/walking at least 150 mins/week.  Please get blood tests done after 2 weeks.

## 2024-01-15 ENCOUNTER — Ambulatory Visit: Payer: Self-pay

## 2024-01-15 ENCOUNTER — Encounter: Payer: Self-pay | Admitting: Family Medicine

## 2024-01-15 ENCOUNTER — Ambulatory Visit: Admitting: Family Medicine

## 2024-01-15 VITALS — BP 150/80 | HR 83 | Resp 16 | Ht 71.0 in | Wt 227.4 lb

## 2024-01-15 DIAGNOSIS — M79641 Pain in right hand: Secondary | ICD-10-CM | POA: Diagnosis not present

## 2024-01-15 MED ORDER — PREDNISONE 20 MG PO TABS: 40.0000 mg | ORAL_TABLET | Freq: Every day | ORAL | 0 refills | Status: AC

## 2024-01-15 MED ORDER — OXYCODONE-ACETAMINOPHEN 5-325 MG PO TABS: 1.0000 | ORAL_TABLET | Freq: Four times a day (QID) | ORAL | 0 refills | Status: AC | PRN

## 2024-01-15 NOTE — Patient Instructions (Addendum)
 I appreciate the opportunity to provide care to you today!  Right hand pain -Start taking Prednisone  40 mg daily for 5 days to decrease inflammation and swelling. -I recommend applying ice to the affected area for 10-20 minutes to reduce swelling. -You may take Percocet 5/325 mg every 6 hours as needed for pain rated greater than 7. -Please follow up if your symptoms worsen or fail to improve.    Please continue to a heart-healthy diet and increase your physical activities. Try to exercise for at least five days a week.    It was a pleasure to see you and I look forward to continuing to work together on your health and well-being. Please do not hesitate to call the office if you need care or have questions about your care.  In case of emergency, please visit the Emergency Department for urgent care, or contact our clinic at [phone] to schedule an appointment. We're here to help you!   Have a wonderful day and week. With Gratitude, Skylinn Vialpando MSN, FNP-BC

## 2024-01-15 NOTE — Telephone Encounter (Signed)
 Pt has 10am appt with gloria

## 2024-01-15 NOTE — Assessment & Plan Note (Signed)
 The patient presents with right hand pain. He is advised to start taking Prednisone  40 mg daily for 5 days to reduce inflammation and swelling. Ice application to the affected area is recommended for 10-20 minutes at a time to help decrease swelling. For pain rated greater than 7 out of 10, she may take Percocet 5/325 mg every 6 hours as needed. The patient is advised to follow up if symptoms worsen or fail to improve.

## 2024-01-15 NOTE — Telephone Encounter (Signed)
    FYI Only or Action Required?: FYI only for provider  Patient was last seen in primary care on 01/07/2024 by Meldon Sport, MD. Called Nurse Triage reporting hand swelling. Symptoms began several days ago. Interventions attempted: Nothing. Symptoms are: swelling/redness/warmth to right knuckles and hand rapidly worsening.  Triage Disposition: See Physician Within 24 Hours  Patient/caregiver understands and will follow disposition?: Yes                                Copied from CRM [id number]. Topic: Clinical - Red Word Triage >> Jan 15, 2024  9:11 AM Rosaria Common wrote: Red Word that prompted transfer to Nurse Triage: Right hand swollen Reason for Disposition  MODERATE hand swelling (e.g., visible swelling of hand and fingers; pitting edema)  Answer Assessment - Initial Assessment Questions 1. ONSET: "When did the swelling start?" (e.g., minutes, hours, days)     2 days ago.  2. LOCATION: "What part of the hand is swollen?"  "Are both hands swollen or just one hand?"     Right hand.  3. SEVERITY: "How bad is the swelling?" (e.g., localized; mild, moderate, severe)   - BALL OR LUMP: small ball or lump   - LOCALIZED: puffy or swollen area or patch of skin   - JOINT SWELLING: swelling of a joint   - MILD: puffiness or mild swelling of fingers or hand   - MODERATE: fingers and hand are swollen   - SEVERE: swelling of entire hand and up into forearm     Moderate in knuckles and hand.  4. REDNESS: "Does the swelling look red or infected?"     Redness and warmth, yes.  5. PAIN: "Is the swelling painful to touch?" If Yes, ask: "How painful is it?"   (Scale 1-10; mild, moderate or severe)     10/10, right hand.  6. FEVER: "Do you have a fever?" If Yes, ask: "What is it, how was it measured, and when did it start?"      No.  7. CAUSE: "What do you think is causing the hand swelling?" (e.g., heat, insect bite, pregnancy, recent injury)     Old injury. He  states no recent injury or overuse.  8. MEDICAL HISTORY: "Do you have a history of heart failure, kidney disease, liver failure, or cancer?"     No.  9. RECURRENT SYMPTOM: "Have you had hand swelling before?" If Yes, ask: "When was the last time?" "What happened that time?"     Yes, he states about a year or so ago. He states he had any injury about 30 years ago to the right knuckle. He states he saw a specialist who told him he would need a surgery to replace the knuckle but it wouldn't be worth it in the long run.  10. OTHER SYMPTOMS: "Do you have any other symptoms?" (e.g., blurred vision, difficulty breathing, headache)       No.  11. PREGNANCY: "Is there any chance you are pregnant?" "When was your last menstrual period?"       N/A.  Patient denies chest pain, additional swelling, SOB.  Protocols used: Hand Swelling-A-AH

## 2024-01-15 NOTE — Progress Notes (Signed)
 Acute Office Visit  Subjective:    Patient ID: Jeremy Burgess, male    DOB: Sep 17, 1965, 58 y.o.   MRN: [id number]  Chief Complaint  Patient presents with   hand swelling    Right hand swelling and pain x 2 days. Rates pain 10/10    HPI The patient presents today with complaints of right hand pain and swelling for the past two days. She reports a prior history of a fractured wrist in the same hand. There is no recent injury, trauma, or insect bite reported.   Past Medical History:  Diagnosis Date   Accidentally struck by falling tree 02/08/2015   Acute blood loss anemia 02/09/2015   Acute respiratory failure (HCC) 02/09/2015   Acute respiratory failure (HCC) 02/09/2015   Ankle fracture, right 02/09/2015   Arthritis    Asthma    Atrial flutter (HCC)    a. diagnosed in 08/2018 b. s/p DCCV in 09/2018 with return to [facility]   Chronic pain due to trauma    Closed fracture of right ulna 02/08/2015   Closed right pilon fracture 02/23/2015   Diabetes mellitus    Diabetes mellitus (HCC)    Femur fracture, right (HCC) 02/08/2015   Heart murmur    History of blood clots    DVT   Hypertension    Injury of hand, extensor tendon, right, sequela 09/01/2018   Pneumothorax    Postlaminectomy syndrome, lumbar region 11/13/2015   Rib fracture    Thrombocytopenia (HCC) 02/09/2015   Traumatic closed displaced fracture of multiple ribs of right side 02/08/2015   Traumatic hemopneumothorax 02/09/2015   Upper GI bleed 05/26/2016   Upper GI bleeding 05/27/2016   Trivial. EGD unremarkable.    Past Surgical History:  Procedure Laterality Date   BACK SURGERY     CARDIOVERSION N/A 10/06/2018   Procedure: CARDIOVERSION;  Surgeon: Gerard Knight, MD;  Location: [facility];  Service: Cardiovascular;  Laterality: N/A;   CHOLECYSTECTOMY N/A 10/30/2016   Procedure: LAPAROSCOPIC CHOLECYSTECTOMY;  Surgeon: Alanda Allegra, MD;  Location: [facility];  Service: General;  Laterality: N/A;   COLONOSCOPY N/A 07/25/2016    Dr. Riley Cheadle: 9 mm tubular adenoma. 5 year surveillance    ESOPHAGOGASTRODUODENOSCOPY  2009   EGD completed by Dr. Riley Cheadle due to hematemesis. Findings of normal esophagus, tiny antral erosions, normal D1, D2   ESOPHAGOGASTRODUODENOSCOPY N/A 05/27/2016   Dr. Riley Cheadle: normal    EXTERNAL FIXATION LEG Right 02/09/2015   Procedure: EXTERNAL FIXATION LEG;  Surgeon: Saundra Curl, MD;  Location: [facility] OR;  Service: Orthopedics;  Laterality: Right;   EXTERNAL FIXATION REMOVAL Right 02/14/2015   Procedure: REMOVAL EXTERNAL FIXATION LEG;  Surgeon: Saundra Curl, MD;  Location: [facility] OR;  Service: Orthopedics;  Laterality: Right;   FEMUR IM NAIL Right 02/09/2015   Procedure: INTRAMEDULLARY (IM) RETROGRADE FEMORAL NAILING;  Surgeon: Saundra Curl, MD;  Location: [facility] OR;  Service: Orthopedics;  Laterality: Right;   NOSE SURGERY     ORIF ANKLE FRACTURE Right 02/14/2015   Procedure: OPEN REDUCTION INTERNAL FIXATION (ORIF) PILON FRACTURE;  Surgeon: Saundra Curl, MD;  Location: [facility] OR;  Service: Orthopedics;  Laterality: Right;   ORIF ULNAR FRACTURE Right 02/09/2015   Procedure: OPEN REDUCTION INTERNAL FIXATION (ORIF) ULNAR FRACTURE;  Surgeon: Saundra Curl, MD;  Location: [facility] OR;  Service: Orthopedics;  Laterality: Right;   POLYPECTOMY  07/25/2016   Procedure: POLYPECTOMY;  Surgeon: Suzette Espy, MD;  Location: [facility];  Service: Endoscopy;;  colon  TOOTH EXTRACTION  04/2023    Family History  Problem Relation Age of Onset   Diabetes Mother    COPD Mother    CAD Father        CABG in his 56's   Diabetes Sister    Atrial fibrillation Brother    Colon cancer Neg Hx     Social History   Socioeconomic History   Marital status: Single    Spouse name: Not on file   Number of children: Not on file   Years of education: Not on file   Highest education level: Not on file  Occupational History   Not on file  Tobacco Use   Smoking status: Never   Smokeless tobacco: Never  Vaping Use    Vaping status: Never Used  Substance and Sexual Activity   Alcohol use: No    Comment: history of alcohol abuse but quit in 2008    Drug use: No   Sexual activity: Yes  Other Topics Concern   Not on file  Social History Narrative       Single.Lives alone.On disability secondary to tree fall accident 2016.   Social Drivers of Health   Financial Resource Strain: Medium Risk (06/27/2020)   Overall Financial Resource Strain (CARDIA)    Difficulty of Paying Living Expenses: Somewhat hard  Food Insecurity: Not on file  Transportation Needs: No Transportation Needs (08/30/2022)   PRAPARE - Administrator, Civil Service (Medical): No    Lack of Transportation (Non-Medical): No  Physical Activity: Not on file  Stress: Not on file  Social Connections: Not on file  Intimate Partner Violence: Not on file    Outpatient Medications Prior to Visit  Medication Sig Dispense Refill   amLODipine  (NORVASC ) 10 MG tablet Take 1 tablet (10 mg total) by mouth daily. 90 tablet 3   apixaban  (ELIQUIS ) 5 MG TABS tablet Take 1 tablet (5 mg total) by mouth 2 (two) times daily. 60 tablet 11   atorvastatin  (LIPITOR) 10 MG tablet Take 1 tablet (10 mg total) by mouth daily. 90 tablet 3   BD INSULIN  SYRINGE U/F 31G X 5/16" 1 ML MISC USE AS DIRECTED 100 each 0   chlorthalidone  (HYGROTON ) 25 MG tablet Take 0.5 tablets (12.5 mg total) by mouth daily. 45 tablet 1   Cholecalciferol (VITAMIN D -3) 125 MCG (5000 UT) TABS Take 15,000 mg by mouth once a week.     cyclobenzaprine  (FLEXERIL ) 5 MG tablet Take 1 tablet (5 mg total) by mouth 2 (two) times daily as needed. 30 tablet 0   gabapentin  (NEURONTIN ) 100 MG capsule Take 1 capsule (100 mg total) by mouth at bedtime. 90 capsule 1   glipiZIDE  (GLUCOTROL  XL) 2.5 MG 24 hr tablet Take 1 tablet (2.5 mg total) by mouth daily with breakfast. 90 tablet 3   glucose blood (ACCU-CHEK GUIDE) test strip USE 1 STRIP TO CHECK GLUCOSE TWICE DAILY 100 each 11   JANUMET  XR  50-1000 MG TB24 Take 2 tablets by mouth daily. 180 tablet 3   losartan  (COZAAR ) 50 MG tablet Take 1 tablet (50 mg total) by mouth daily. 90 tablet 3   MAGnesium -Oxide 400 (240 Mg) MG tablet TAKE 1 TABLET BY MOUTH TWICE DAILY FOR 5 DAYS THEN 1 DAILY 90 tablet 3   No facility-administered medications prior to visit.    Allergies  Allergen Reactions   Bee Venom Anaphylaxis    HONEY BEES   Lisinopril  Cough    Review of Systems  Constitutional:  Negative for fatigue and fever.  Eyes:  Negative for visual disturbance.  Respiratory:  Negative for chest tightness and shortness of breath.   Cardiovascular:  Negative for chest pain and palpitations.  Musculoskeletal:        Right hand pain  Neurological:  Negative for dizziness and headaches.       Objective:     Physical Exam HENT:     Head: Normocephalic.     Right Ear: External ear normal.     Left Ear: External ear normal.     Nose: No congestion or rhinorrhea.     Mouth/Throat:     Mouth: Mucous membranes are moist.  Cardiovascular:     Rate and Rhythm: Regular rhythm.     Heart sounds: No murmur heard. Pulmonary:     Effort: No respiratory distress.     Breath sounds: Normal breath sounds.  Musculoskeletal:     Right hand: Swelling and tenderness present.  Neurological:     Mental Status: He is alert.     BP (!) 151/78   Pulse 83   Resp 16   Ht 5\' 11"  (1.803 m)   Wt 227 lb 6.4 oz (103.1 kg)   SpO2 94%   BMI 31.72 kg/m  Wt Readings from Last 3 Encounters:  01/15/24 227 lb 6.4 oz (103.1 kg)  01/07/24 229 lb 12.8 oz (104.2 kg)  09/09/23 229 lb 3.2 oz (104 kg)       Assessment & Plan:  Right hand pain Assessment & Plan: The patient presents with right hand pain. He is advised to start taking Prednisone  40 mg daily for 5 days to reduce inflammation and swelling. Ice application to the affected area is recommended for 10-20 minutes at a time to help decrease swelling. For pain rated greater than 7 out of 10,  she may take Percocet 5/325 mg every 6 hours as needed. The patient is advised to follow up if symptoms worsen or fail to improve.   Orders: -     predniSONE ; Take 2 tablets (40 mg total) by mouth daily for 5 days.  Dispense: 10 tablet; Refill: 0 -     oxyCODONE -Acetaminophen ; Take 1 tablet by mouth every 6 (six) hours as needed for up to 5 days for severe pain (pain score 7-10).  Dispense: 15 tablet; Refill: 0   Note: This chart has been completed using Engineer, civil (consulting) software, and while attempts have been made to ensure accuracy, certain words and phrases may not be transcribed as intended.   Elyjah Hazan, FNP

## 2024-01-26 ENCOUNTER — Emergency Department (HOSPITAL_COMMUNITY)

## 2024-01-26 ENCOUNTER — Encounter (HOSPITAL_COMMUNITY): Payer: Self-pay

## 2024-01-26 ENCOUNTER — Other Ambulatory Visit: Payer: Self-pay

## 2024-01-26 ENCOUNTER — Emergency Department (HOSPITAL_COMMUNITY)
Admission: EM | Admit: 2024-01-26 | Discharge: 2024-01-26 | Disposition: A | Discharge status: Home / Self Care | Attending: Emergency Medicine | Admitting: Emergency Medicine

## 2024-01-26 DIAGNOSIS — M1712 Unilateral primary osteoarthritis, left knee: Secondary | ICD-10-CM | POA: Diagnosis not present

## 2024-01-26 DIAGNOSIS — Z7901 Long term (current) use of anticoagulants: Secondary | ICD-10-CM | POA: Diagnosis not present

## 2024-01-26 DIAGNOSIS — M25462 Effusion, left knee: Secondary | ICD-10-CM | POA: Diagnosis not present

## 2024-01-26 DIAGNOSIS — M25562 Pain in left knee: Secondary | ICD-10-CM | POA: Diagnosis not present

## 2024-01-26 MED ORDER — OXYCODONE-ACETAMINOPHEN 5-325 MG PO TABS: 1.0000 | ORAL_TABLET | ORAL | 0 refills | Status: DC | PRN

## 2024-01-26 MED ORDER — DOXYCYCLINE HYCLATE 100 MG PO CAPS: 100.0000 mg | ORAL_CAPSULE | Freq: Two times a day (BID) | ORAL | 0 refills | Status: DC

## 2024-01-26 MED ORDER — OXYCODONE-ACETAMINOPHEN 5-325 MG PO TABS
1.0000 | ORAL_TABLET | Freq: Once | ORAL | Status: AC
  Administered 2024-01-26: 1 via ORAL
  Filled 2024-01-26: qty 1

## 2024-01-26 NOTE — ED Provider Notes (Addendum)
 [place] EMERGENCY DEPARTMENT AT [facility] Provider Note   CSN: [id number] Arrival date & time: 01/26/24  2027     Patient presents with: Left Knee Pain   Jeremy Burgess is a 58 y.o. male.   Presents to the emergency department for evaluation of left knee pain.  Patient reports that he saw his doctor recently for hand pain and knee pain, was placed on prednisone .  Symptoms have not completely resolved.  Patient reports pain in the knee with walking.  No known injury.       Prior to Admission medications   Medication Sig Start Date End Date Taking? Authorizing Provider  amLODipine  (NORVASC ) 10 MG tablet Take 1 tablet (10 mg total) by mouth daily. 01/07/24   Meldon Sport, MD  apixaban  (ELIQUIS ) 5 MG TABS tablet Take 1 tablet (5 mg total) by mouth 2 (two) times daily. 12/18/23   Meldon Sport, MD  atorvastatin  (LIPITOR) 10 MG tablet Take 1 tablet (10 mg total) by mouth daily. 01/07/24   Meldon Sport, MD  BD INSULIN  SYRINGE U/F 31G X 5/16 1 ML MISC USE AS DIRECTED 12/03/22   Meldon Sport, MD  chlorthalidone  (HYGROTON ) 25 MG tablet Take 0.5 tablets (12.5 mg total) by mouth daily. 01/07/24   Meldon Sport, MD  Cholecalciferol (VITAMIN D -3) 125 MCG (5000 UT) TABS Take 15,000 mg by mouth once a week.    [provider]  cyclobenzaprine  (FLEXERIL ) 5 MG tablet Take 1 tablet (5 mg total) by mouth 2 (two) times daily as needed. 09/09/23   Meldon Sport, MD  gabapentin  (NEURONTIN ) 100 MG capsule Take 1 capsule (100 mg total) by mouth at bedtime. 09/09/23   Meldon Sport, MD  glipiZIDE  (GLUCOTROL  XL) 2.5 MG 24 hr tablet Take 1 tablet (2.5 mg total) by mouth daily with breakfast. 01/07/24   Meldon Sport, MD  glucose blood (ACCU-CHEK GUIDE) test strip USE 1 STRIP TO CHECK GLUCOSE TWICE DAILY 05/08/23   Patel, Rutwik K, MD  JANUMET  XR 50-1000 MG TB24 Take 2 tablets by mouth daily. 01/07/24   Meldon Sport, MD  losartan  (COZAAR ) 50 MG tablet Take 1 tablet (50 mg  total) by mouth daily. 01/07/24   Meldon Sport, MD  MAGnesium -Oxide 400 (240 Mg) MG tablet TAKE 1 TABLET BY MOUTH TWICE DAILY FOR 5 DAYS THEN 1 DAILY 10/14/23   Laurann Pollock, MD    Allergies: Bee venom and Lisinopril     Review of Systems  Updated Vital Signs BP (!) 166/76 (BP Location: Right Arm)   Pulse 80   Temp 97.8 F (36.6 C) (Oral)   Resp 20   SpO2 99%   Physical Exam Constitutional:      Appearance: Normal appearance.  HENT:     Head: Normocephalic and atraumatic.   Musculoskeletal:     Left knee: No swelling, deformity, effusion, erythema or ecchymosis. Normal range of motion. Tenderness present over the medial joint line and lateral joint line.   Skin:    Findings: No erythema, rash or wound.   Neurological:     Mental Status: He is alert.     Cranial Nerves: Cranial nerves 2-12 are intact.     Sensory: Sensation is intact.     Motor: Motor function is intact.     (all labs ordered are listed, but only abnormal results are displayed) Labs Reviewed - No data to display  EKG: None  Radiology: [facility] Knee Left Port Result  Date: 01/26/2024 CLINICAL DATA:  Knee pain EXAM: PORTABLE LEFT KNEE - 1-2 VIEW COMPARISON:  02/01/2018 FINDINGS: No acute fracture or malalignment. Trace knee effusion. Chronic fusion across the proximal tibiofibular articulation. IMPRESSION: No acute osseous abnormality. Trace knee effusion. Electronically Signed   By: Esmeralda Hedge M.D.   On: 01/26/2024 21:05     Procedures   Medications Ordered in the ED - No data to display                                  Medical Decision Making Amount and/or Complexity of Data Reviewed Radiology: ordered.  Risk Prescription drug management.   Presents with nontraumatic left knee pain.  Examination is reassuring.  No erythema, warmth or swelling.  There is no joint effusion.  Range of motion is preserved.  No concern for septic arthritis.  X-ray shows arthritis, no acute findings.    Patient does report multiple tick bites recently.  No associated rash.  He appears well.  Will cover with doxycycline .  Final diagnoses:  Acute pain of left knee  Osteoarthritis of left knee, unspecified osteoarthritis type    ED Discharge Orders     None          Ballard Bongo, MD 01/26/24 [id number]    Ballard Bongo, MD 01/26/24 2354

## 2024-01-26 NOTE — ED Triage Notes (Signed)
 Pt c/o left knee pain and swelling. Symptoms started 5 days ago. Pt able to walk slightly. Denies any recent injuries but states he has injured it before.

## 2024-01-27 ENCOUNTER — Ambulatory Visit: Admitting: Orthopedic Surgery

## 2024-01-27 DIAGNOSIS — M25562 Pain in left knee: Secondary | ICD-10-CM | POA: Diagnosis not present

## 2024-01-27 MED ORDER — PREDNISONE 10 MG (21) PO TBPK: ORAL_TABLET | ORAL | 0 refills | Status: DC

## 2024-01-27 NOTE — Progress Notes (Signed)
 Orthopaedic Clinic Return  Assessment: Jeremy Burgess is a 58 y.o. male with the following: Left knee pain; presentation consistent with gout  Plan: Jeremy Burgess has pain in the left knee.  No specific injury.  Remote history of an injury, without a bag this is many years ago.  He has a history of gout, but never in his left knee.  He did have an effusion, in clinic today.  We attempted to aspirate the left knee.  He was in a lot of discomfort.  He tolerated it well.  We are unable to get any fluid for evaluation.  Nonetheless, I do think that this is a gout flare.  He has swelling, redness, warmth and exquisite pain to very light touch.  I provided him with a prednisone  Dosepak.  I would like to see him back in 2 weeks.  Will likely need to initiate some empiric treatment for gout.  Procedure note - aspiration left knee joint   Verbal consent was obtained to aspirate the left knee joint  Timeout was completed to confirm the site of aspiration. The skin was prepped with alcohol and ethyl chloride was sprayed at the aspiration site.  An 18-gauge needle was inserted and 0 cc of joint fluid was aspirated using a superolateral approach.  There were no complications. A sterile bandage was applied.    Meds ordered this encounter  Medications   predniSONE  (STERAPRED UNI-PAK 21 TAB) 10 MG (21) TBPK tablet    Sig: 10 mg DS 12 as directed    Dispense:  48 tablet    Refill:  0    There is no height or weight on file to calculate BMI.  Follow-up: Return in about 2 weeks (around 02/10/2024).   Subjective:  Chief Complaint  Patient presents with   Knee Pain    L for 1 wk getting worse. Pt states swelling started in the hand but is now in the knee. First few steps are the worse. Was given prednisone  that didn't help the knee. Feels swelling is worse.     History of Present Illness: Jeremy Burgess is a 58 y.o. male who returns to clinic for evaluation of left knee pain.  He reports that he  had pain in the left knee for about a week.  No specific injury.  He started with some pain in the hand, which ultimately led to pain in the left knee.  He does have a history of a remote injury to the left knee.  No diagnosis.  He has been using a cane.  He has noticed a lot of swelling.  The knee has been warm to the touch.  History of gout, but not of the left knee.  Evaluated the emergency department.  Graphs are negative.  He was given some prednisone , without improvement for his left knee.  He states his knee hurts even when his pant leg presses over his knee.  Review of Systems: No fevers or chills No numbness or tingling No chest pain No shortness of breath No bowel or bladder dysfunction No GI distress No headaches   Objective: There were no vitals taken for this visit.  Physical Exam:  Alert and oriented.  No acute distress.  Ambulating with a cane.  Left knee with a mild to moderate effusion.  No redness is appreciated.  It is warm to touch.  Exquisite tenderness to light touch.  Range of motion is restricted.  IMAGING: I personally ordered and reviewed the following  images:  X-rays from the emergency department were available in clinic today.  No acute injuries.  Minimal degenerative changes.  Jeremy Frater, MD 01/27/2024 11:46 AM

## 2024-01-28 ENCOUNTER — Encounter: Payer: Self-pay | Admitting: Orthopedic Surgery

## 2024-01-28 DIAGNOSIS — E1169 Type 2 diabetes mellitus with other specified complication: Secondary | ICD-10-CM | POA: Diagnosis not present

## 2024-01-28 DIAGNOSIS — Z7901 Long term (current) use of anticoagulants: Secondary | ICD-10-CM | POA: Diagnosis not present

## 2024-01-29 ENCOUNTER — Ambulatory Visit: Payer: Self-pay | Admitting: Internal Medicine

## 2024-01-29 LAB — CMP14+EGFR
ALT: 12 IU/L (ref 0–44)
AST: 15 IU/L (ref 0–40)
Albumin: 4.6 g/dL (ref 3.8–4.9)
Alkaline Phosphatase: 84 IU/L (ref 44–121)
BUN/Creatinine Ratio: 22 (ref 10–24)
BUN: 31 mg/dL — ABNORMAL HIGH (ref 8–27)
Bilirubin Total: 0.4 mg/dL (ref 0.0–1.2)
CO2: 19 mmol/L — ABNORMAL LOW (ref 20–29)
Calcium: 10.2 mg/dL (ref 8.6–10.2)
Chloride: 100 mmol/L (ref 96–106)
Creatinine, Ser: 1.41 mg/dL — ABNORMAL HIGH (ref 0.76–1.27)
Globulin, Total: 2.9 g/dL (ref 1.5–4.5)
Glucose: 130 mg/dL — ABNORMAL HIGH (ref 70–99)
Potassium: 4.7 mmol/L (ref 3.5–5.2)
Sodium: 138 mmol/L (ref 134–144)
Total Protein: 7.5 g/dL (ref 6.0–8.5)
eGFR: 57 mL/min/{1.73_m2} — ABNORMAL LOW (ref >59)

## 2024-01-29 LAB — CBC WITH DIFFERENTIAL/PLATELET
Basophils Absolute: 0 10*3/uL (ref 0.0–0.2)
Basos: 0 %
EOS (ABSOLUTE): 0 10*3/uL (ref 0.0–0.4)
Eos: 0 %
Hematocrit: 40 % (ref 37.5–51.0)
Hemoglobin: 13.3 g/dL (ref 13.0–17.7)
Immature Grans (Abs): 0 10*3/uL (ref 0.0–0.1)
Immature Granulocytes: 0 %
Lymphocytes Absolute: 1 10*3/uL (ref 0.7–3.1)
Lymphs: 6 %
MCH: 29.1 pg (ref 26.6–33.0)
MCHC: 33.3 g/dL (ref 31.5–35.7)
MCV: 88 fL (ref 79–97)
Monocytes Absolute: 0.3 10*3/uL (ref 0.1–0.9)
Monocytes: 2 %
Neutrophils Absolute: 16.9 10*3/uL — ABNORMAL HIGH (ref 1.4–7.0)
Neutrophils: 92 %
Platelets: 308 10*3/uL (ref 150–450)
RBC: 4.57 x10E6/uL (ref 4.14–5.80)
RDW: 12.9 % (ref 11.6–15.4)
WBC: 18.4 10*3/uL — ABNORMAL HIGH (ref 3.4–10.8)

## 2024-01-29 LAB — HEMOGLOBIN A1C
Est. average glucose Bld gHb Est-mCnc: 143 mg/dL
Hgb A1c MFr Bld: 6.6 % — ABNORMAL HIGH (ref 4.8–5.6)

## 2024-02-10 ENCOUNTER — Ambulatory Visit: Admitting: Orthopedic Surgery

## 2024-02-10 ENCOUNTER — Encounter: Payer: Self-pay | Admitting: Orthopedic Surgery

## 2024-02-10 DIAGNOSIS — M25562 Pain in left knee: Secondary | ICD-10-CM | POA: Diagnosis not present

## 2024-02-10 NOTE — Patient Instructions (Addendum)
 Recommend voltaren  gel or diclofenac  gel to rub on the front of your knee.      Knee Exercises  Ask your health care provider which exercises are safe for you. Do exercises exactly as told by your health care provider and adjust them as directed. It is normal to feel mild stretching, pulling, tightness, or discomfort as you do these exercises. Stop right away if you feel sudden pain or your pain gets worse. Do not begin these exercises until told by your health care provider.  Stretching and range-of-motion exercises These exercises warm up your muscles and joints and improve the movement and flexibility of your knee. These exercises also help to relieve pain and swelling.  Knee extension, prone Lie on your abdomen (prone position) on a bed. Place your left / right knee just beyond the edge of the surface so your knee is not on the bed. You can put a towel under your left / right thigh just above your kneecap for comfort. Relax your leg muscles and allow gravity to straighten your knee (extension). You should feel a stretch behind your left / right knee. Hold this position for 10 seconds. Scoot up so your knee is supported between repetitions. Repeat 10 times. Complete this exercise 3-4 times per week.     Knee flexion, active Lie on your back with both legs straight. If this causes back discomfort, bend your left / right knee so your foot is flat on the floor. Slowly slide your left / right heel back toward your buttocks. Stop when you feel a gentle stretch in the front of your knee or thigh (flexion). Hold this position for 10 seconds. Slowly slide your left / right heel back to the starting position. Repeat 10 times. Complete this exercise 3-4 times per week.      Quadriceps stretch, prone Lie on your abdomen on a firm surface, such as a bed or padded floor. Bend your left / right knee and hold your ankle. If you cannot reach your ankle or pant leg, loop a belt around your foot and  grab the belt instead. Gently pull your heel toward your buttocks. Your knee should not slide out to the side. You should feel a stretch in the front of your thigh and knee (quadriceps). Hold this position for 10 seconds. Repeat 10 times. Complete this exercise 3-4 times per week.      Hamstring, supine Lie on your back (supine position). Loop a belt or towel over the ball of your left / right foot. The ball of your foot is on the walking surface, right under your toes. Straighten your left / right knee and slowly pull on the belt to raise your leg until you feel a gentle stretch behind your knee (hamstring). Do not let your knee bend while you do this. Keep your other leg flat on the floor. Hold this position for 10 seconds. Repeat 10 times. Complete this exercise 3-4 times per week.   Strengthening exercises These exercises build strength and endurance in your knee. Endurance is the ability to use your muscles for a long time, even after they get tired.  Quadriceps, isometric This exercise stretches the muscles in front of your thigh (quadriceps) without moving your knee joint (isometric). Lie on your back with your left / right leg extended and your other knee bent. Put a rolled towel or small pillow under your knee if told by your health care provider. Slowly tense the muscles in the front of  your left / right thigh. You should see your kneecap slide up toward your hip or see increased dimpling just above the knee. This motion will push the back of the knee toward the floor. For 10 seconds, hold the muscle as tight as you can without increasing your pain. Relax the muscles slowly and completely. Repeat 10 times. Complete this exercise 3-4 times per week. .     Straight leg raises This exercise stretches the muscles in front of your thigh (quadriceps) and the muscles that move your hips (hip flexors). Lie on your back with your left / right leg extended and your other knee  bent. Tense the muscles in the front of your left / right thigh. You should see your kneecap slide up or see increased dimpling just above the knee. Your thigh may even shake a bit. Keep these muscles tight as you raise your leg 4-6 inches (10-15 cm) off the floor. Do not let your knee bend. Hold this position for 10 seconds. Keep these muscles tense as you lower your leg. Relax your muscles slowly and completely after each repetition. Repeat 10 times. Complete this exercise 3-4 times per week.  Hamstring, isometric Lie on your back on a firm surface. Bend your left / right knee about 30 degrees. Dig your left / right heel into the surface as if you are trying to pull it toward your buttocks. Tighten the muscles in the back of your thighs (hamstring) to dig as hard as you can without increasing any pain. Hold this position for 10 seconds. Release the tension gradually and allow your muscles to relax completely for __________ seconds after each repetition. Repeat 10 times. Complete this exercise 3-4 times per week.  Hamstring curls If told by your health care provider, do this exercise while wearing ankle weights. Begin with 5 lb weights. Then increase the weight by 1 lb (0.5 kg) increments. You can also use an exercise band Lie on your abdomen with your legs straight. Bend your left / right knee as far as you can without feeling pain. Keep your hips flat against the floor. Hold this position for 10 seconds. Slowly lower your leg to the starting position. Repeat 10 times. Complete this exercise 3-4 times per week.      Squats This exercise strengthens the muscles in front of your thigh and knee (quadriceps). Stand in front of a table, with your feet and knees pointing straight ahead. You may rest your hands on the table for balance but not for support. Slowly bend your knees and lower your hips like you are going to sit in a chair. Keep your weight over your heels, not over your  toes. Keep your lower legs upright so they are parallel with the table legs. Do not let your hips go lower than your knees. Do not bend lower than told by your health care provider. If your knee pain increases, do not bend as low. Hold the squat position for 10 seconds. Slowly push with your legs to return to standing. Do not use your hands to pull yourself to standing. Repeat 10 times. Complete this exercise 3-4 times per week .     Wall slides This exercise strengthens the muscles in front of your thigh and knee (quadriceps). Lean your back against a smooth wall or door, and walk your feet out 18-24 inches (46-61 cm) from it. Place your feet hip-width apart. Slowly slide down the wall or door until your knees bend 90 degrees.  Keep your knees over your heels, not over your toes. Keep your knees in line with your hips. Hold this position for 10 seconds. Repeat 10 times. Complete this exercise 3-4 times per week.      Straight leg raises This exercise strengthens the muscles that rotate the leg at the hip and move it away from your body (hip abductors). Lie on your side with your left / right leg in the top position. Lie so your head, shoulder, knee, and hip line up. You may bend your bottom knee to help you keep your balance. Roll your hips slightly forward so your hips are stacked directly over each other and your left / right knee is facing forward. Leading with your heel, lift your top leg 4-6 inches (10-15 cm). You should feel the muscles in your outer hip lifting. Do not let your foot drift forward. Do not let your knee roll toward the ceiling. Hold this position for 10 seconds. Slowly return your leg to the starting position. Let your muscles relax completely after each repetition. Repeat 10 times. Complete this exercise 3-4 times per week.      Straight leg raises This exercise stretches the muscles that move your hips away from the front of the pelvis (hip extensors). Lie on  your abdomen on a firm surface. You can put a pillow under your hips if that is more comfortable. Tense the muscles in your buttocks and lift your left / right leg about 4-6 inches (10-15 cm). Keep your knee straight as you lift your leg. Hold this position for 10 seconds. Slowly lower your leg to the starting position. Let your leg relax completely after each repetition. Repeat 10 times. Complete this exercise 3-4 times per week.

## 2024-02-10 NOTE — Progress Notes (Signed)
 Orthopaedic Clinic Return  Assessment: Jeremy Burgess is a 58 y.o. male with the following: Left knee pain; anterior knee pain  Plan: Mr. Dube has improved pain in the left knee.  The swelling has improved.  No redness.  Is not as sensitive as it was before he has tenderness over the anterior knee.  Specifically, he has tenderness to palpation from the distal pole of the patella, until the tibial tubercle.  There is tenderness within the patellar tendon.  This may represent some patellar tendinitis.  I do not think an injection would be effective.  I think should try some topical treatments, in order to improve the pain he is having over the anterior knee.  He states understanding.  If he continues to have issues, we can consider an injection.  He will follow-up as needed.  Follow-up: Return if symptoms worsen or fail to improve.   Subjective:  Chief Complaint  Patient presents with   Knee Pain    L knee swelling gone down but pain is still there.    History of Present Illness: Jeremy Burgess is a 58 y.o. male who returns to clinic for evaluation of left knee pain.  I saw him in clinic 2 weeks ago.  At that time, we tried a short course of prednisone .  He states that the prednisone  has improved the swelling.  His pain is now localized to the anterior aspect of the left knee.  He has decreased sensitivity about the knee.  He does have tenderness directly over the anterior knee.  Once again, no injury, or specific mechanism.   Review of Systems: No fevers or chills No numbness or tingling No chest pain No shortness of breath No bowel or bladder dysfunction No GI distress No headaches   Objective: There were no vitals taken for this visit.  Physical Exam:  Alert and oriented.  No acute distress.  Ambulating with left-sided antalgic gait.  No assistive device.  Left knee without effusion.  No redness.  It is not warm to touch.  No sensitivity to gentle touch.  He has good  range of motion.  He can maintain a straight leg raise.  Tenderness palpation of the distal pole the patella, through the patella tendon, and extending to the tibial tubercle.  No increased laxity to varus or valgus stress.   IMAGING: I personally ordered and reviewed the following images:  No new imaging obtained today.  Oneil DELENA Horde, MD 02/10/2024 4:34 PM

## 2024-03-25 ENCOUNTER — Ambulatory Visit: Payer: Self-pay

## 2024-03-25 NOTE — Telephone Encounter (Signed)
 FYI Only or Action Required?: FYI only for provider.  Patient was last seen in primary care on 01/15/2024 by Jeremy Burgess, [name], FNP.  Called Nurse Triage reporting Foot Swelling.  Symptoms began several days ago.  Interventions attempted: Nothing.  Symptoms are: unchanged.  Triage Disposition: See HCP Within 24 Hours (Or PCP Triage)  Patient/caregiver understands and will follow disposition?: Yes    Copied from CRM #[id number]. Topic: Clinical - Red Word Triage >> Mar 25, 2024  8:04 AM Larissa RAMAN wrote: [facility] that prompted transfer to Nurse Triage: rt hand and lt foot swelling, difficulty walking, pain Reason for Disposition  [1] SEVERE ankle pain (e.g., excruciating, unable to walk) AND [2] not improved after 2 hours of pain medicine  Answer Assessment - Initial Assessment Questions 1. LOCATION: Which ankle is swollen? Where is the swelling?     Right hand and left foot 2. ONSET: When did the swelling start?     Sunday 3. SWELLING: How bad is the swelling? Or, How large is it? (e.g., mild, moderate, severe; size of localized swelling)      severe 4. PAIN: Is there any pain? If Yes, ask: How bad is it? (Scale 0-10; or none, mild, moderate, severe)     severe 5. CAUSE: What do you think caused the ankle swelling?     unknown 6. OTHER SYMPTOMS: Do you have any other symptoms? (e.g., fever, chest pain, difficulty breathing, calf pain)     Swelling, pain 7. PREGNANCY: Is there any chance you are pregnant? When was your last menstrual period?     na  Protocols used: Ankle Swelling-A-AH

## 2024-03-26 ENCOUNTER — Encounter: Payer: Self-pay | Admitting: Family Medicine

## 2024-03-26 ENCOUNTER — Ambulatory Visit: Admitting: Family Medicine

## 2024-03-26 VITALS — BP 125/65 | HR 75 | Resp 16 | Ht 71.0 in | Wt 217.0 lb

## 2024-03-26 DIAGNOSIS — M109 Gout, unspecified: Secondary | ICD-10-CM | POA: Insufficient documentation

## 2024-03-26 DIAGNOSIS — M1000 Idiopathic gout, unspecified site: Secondary | ICD-10-CM | POA: Diagnosis not present

## 2024-03-26 MED ORDER — PREDNISONE 20 MG PO TABS: 40.0000 mg | ORAL_TABLET | Freq: Every day | ORAL | 0 refills | Status: AC

## 2024-03-26 NOTE — Progress Notes (Signed)
 Acute Office Visit  Subjective:    Patient ID: Jeremy Burgess, male    DOB: September 06, 1965, 57 y.o.   MRN: [id number]  Chief Complaint  Patient presents with   Edema    Keeps having episodes of different areas of swelling in his body. It was his right hand, then left knee and elbow. The areas get red swollen and very painful. He doesn't know what's causing it    HPI The patient presents today with a new episode of swelling, redness, and constant pain in the left foot, which began on Sunday. He reports the pain started around the toes and has since progressed with swelling and shooting pain extending up the foot. He denies alcohol use and smoking. He consumes red meat occasionally.  The patient reports a pattern of recurrent episodes of swelling in various joints, including prior involvement of the right hand, left knee, and left elbow. These areas become red, swollen, very painful, and difficult to move during flares. Currently, symptoms are affecting the left foot and right hand. He denies fever or chills. The cause of these recurrent flares is unclear to the patient.   Past Medical History:  Diagnosis Date   Accidentally struck by falling tree 02/08/2015   Acute blood loss anemia 02/09/2015   Acute respiratory failure (HCC) 02/09/2015   Acute respiratory failure (HCC) 02/09/2015   Ankle fracture, right 02/09/2015   Arthritis    Asthma    Atrial flutter (HCC)    a. diagnosed in 08/2018 b. s/p DCCV in 09/2018 with return to [facility]   Chronic pain due to trauma    Closed fracture of right ulna 02/08/2015   Closed right pilon fracture 02/23/2015   Diabetes mellitus    Diabetes mellitus (HCC)    Femur fracture, right (HCC) 02/08/2015   Heart murmur    History of blood clots    DVT   Hypertension    Injury of hand, extensor tendon, right, sequela 09/01/2018   Pneumothorax    Postlaminectomy syndrome, lumbar region 11/13/2015   Rib fracture    Thrombocytopenia (HCC) 02/09/2015   Traumatic  closed displaced fracture of multiple ribs of right side 02/08/2015   Traumatic hemopneumothorax 02/09/2015   Upper GI bleed 05/26/2016   Upper GI bleeding 05/27/2016   Trivial. EGD unremarkable.    Past Surgical History:  Procedure Laterality Date   BACK SURGERY     CARDIOVERSION N/A 10/06/2018   Procedure: CARDIOVERSION;  Surgeon: Debera Jayson MATSU, MD;  Location: [facility];  Service: Cardiovascular;  Laterality: N/A;   CHOLECYSTECTOMY N/A 10/30/2016   Procedure: LAPAROSCOPIC CHOLECYSTECTOMY;  Surgeon: Oneil Budge, MD;  Location: [facility];  Service: General;  Laterality: N/A;   COLONOSCOPY N/A 07/25/2016   Dr. Shaaron: 9 mm tubular adenoma. 5 year surveillance    ESOPHAGOGASTRODUODENOSCOPY  2009   EGD completed by Dr. Shaaron due to hematemesis. Findings of normal esophagus, tiny antral erosions, normal D1, D2   ESOPHAGOGASTRODUODENOSCOPY N/A 05/27/2016   Dr. Shaaron: normal    EXTERNAL FIXATION LEG Right 02/09/2015   Procedure: EXTERNAL FIXATION LEG;  Surgeon: Evalene JONETTA Chancy, MD;  Location: [facility] OR;  Service: Orthopedics;  Laterality: Right;   EXTERNAL FIXATION REMOVAL Right 02/14/2015   Procedure: REMOVAL EXTERNAL FIXATION LEG;  Surgeon: Evalene JONETTA Chancy, MD;  Location: [facility] OR;  Service: Orthopedics;  Laterality: Right;   FEMUR IM NAIL Right 02/09/2015   Procedure: INTRAMEDULLARY (IM) RETROGRADE FEMORAL NAILING;  Surgeon: Evalene JONETTA Chancy, MD;  Location: [facility] OR;  Service: Orthopedics;  Laterality: Right;   NOSE SURGERY     ORIF ANKLE FRACTURE Right 02/14/2015   Procedure: OPEN REDUCTION INTERNAL FIXATION (ORIF) PILON FRACTURE;  Surgeon: Evalene JONETTA Chancy, MD;  Location: [facility] OR;  Service: Orthopedics;  Laterality: Right;   ORIF ULNAR FRACTURE Right 02/09/2015   Procedure: OPEN REDUCTION INTERNAL FIXATION (ORIF) ULNAR FRACTURE;  Surgeon: Evalene JONETTA Chancy, MD;  Location: [facility] OR;  Service: Orthopedics;  Laterality: Right;   POLYPECTOMY  07/25/2016   Procedure: POLYPECTOMY;  Surgeon: Lamar CHRISTELLA Hollingshead,  MD;  Location: [facility];  Service: Endoscopy;;  colon   TOOTH EXTRACTION  04/2023    Family History  Problem Relation Age of Onset   Diabetes Mother    COPD Mother    CAD Father        CABG in his 83's   Diabetes Sister    Atrial fibrillation Brother    Colon cancer Neg Hx     Social History   Socioeconomic History   Marital status: Single    Spouse name: Not on file   Number of children: Not on file   Years of education: Not on file   Highest education level: Not on file  Occupational History   Not on file  Tobacco Use   Smoking status: Never   Smokeless tobacco: Never  Vaping Use   Vaping status: Never Used  Substance and Sexual Activity   Alcohol use: No    Comment: history of alcohol abuse but quit in 2008    Drug use: No   Sexual activity: Yes  Other Topics Concern   Not on file  Social History Narrative       Single.Lives alone.On disability secondary to tree fall accident 2016.   Social Drivers of Health   Financial Resource Strain: Medium Risk (06/27/2020)   Overall Financial Resource Strain (CARDIA)    Difficulty of Paying Living Expenses: Somewhat hard  Food Insecurity: Not on file  Transportation Needs: No Transportation Needs (08/30/2022)   PRAPARE - Administrator, Civil Service (Medical): No    Lack of Transportation (Non-Medical): No  Physical Activity: Not on file  Stress: Not on file  Social Connections: Not on file  Intimate Partner Violence: Not on file    Outpatient Medications Prior to Visit  Medication Sig Dispense Refill   amLODipine  (NORVASC ) 10 MG tablet Take 1 tablet (10 mg total) by mouth daily. 90 tablet 3   apixaban  (ELIQUIS ) 5 MG TABS tablet Take 1 tablet (5 mg total) by mouth 2 (two) times daily. 60 tablet 11   atorvastatin  (LIPITOR) 10 MG tablet Take 1 tablet (10 mg total) by mouth daily. 90 tablet 3   BD INSULIN  SYRINGE U/F 31G X 5/16 1 ML MISC USE AS DIRECTED 100 each 0   chlorthalidone  (HYGROTON ) 25 MG  tablet Take 0.5 tablets (12.5 mg total) by mouth daily. 45 tablet 1   Cholecalciferol (VITAMIN D -3) 125 MCG (5000 UT) TABS Take 15,000 mg by mouth once a week.     cyclobenzaprine  (FLEXERIL ) 5 MG tablet Take 1 tablet (5 mg total) by mouth 2 (two) times daily as needed. 30 tablet 0   doxycycline  (VIBRAMYCIN ) 100 MG capsule Take 1 capsule (100 mg total) by mouth 2 (two) times daily. 20 capsule 0   gabapentin  (NEURONTIN ) 100 MG capsule Take 1 capsule (100 mg total) by mouth at bedtime. 90 capsule 1   glipiZIDE  (GLUCOTROL  XL) 2.5 MG 24 hr tablet Take 1 tablet (  2.5 mg total) by mouth daily with breakfast. 90 tablet 3   glucose blood (ACCU-CHEK GUIDE) test strip USE 1 STRIP TO CHECK GLUCOSE TWICE DAILY 100 each 11   JANUMET  XR 50-1000 MG TB24 Take 2 tablets by mouth daily. 180 tablet 3   losartan  (COZAAR ) 50 MG tablet Take 1 tablet (50 mg total) by mouth daily. 90 tablet 3   MAGnesium -Oxide 400 (240 Mg) MG tablet TAKE 1 TABLET BY MOUTH TWICE DAILY FOR 5 DAYS THEN 1 DAILY 90 tablet 3   oxyCODONE -acetaminophen  (PERCOCET) 5-325 MG tablet Take 1-2 tablets by mouth every 4 (four) hours as needed. 10 tablet 0   predniSONE  (STERAPRED UNI-PAK 21 TAB) 10 MG (21) TBPK tablet 10 mg DS 12 as directed 48 tablet 0   No facility-administered medications prior to visit.    Allergies  Allergen Reactions   Bee Venom Anaphylaxis    HONEY BEES   Lisinopril  Cough    Review of Systems  Constitutional:  Negative for fatigue and fever.  Eyes:  Negative for visual disturbance.  Respiratory:  Negative for chest tightness and shortness of breath.   Cardiovascular:  Negative for chest pain and palpitations.  Musculoskeletal:        Left foot and right hand swelling, tender  Neurological:  Negative for dizziness and headaches.       Objective:    Physical Exam HENT:     Head: Normocephalic.     Right Ear: External ear normal.     Left Ear: External ear normal.     Nose: No congestion or rhinorrhea.      Mouth/Throat:     Mouth: Mucous membranes are moist.  Cardiovascular:     Rate and Rhythm: Regular rhythm.     Heart sounds: No murmur heard. Pulmonary:     Effort: No respiratory distress.     Breath sounds: Normal breath sounds.  Musculoskeletal:        General: Swelling and tenderness present.  Neurological:     Mental Status: He is alert.     BP 125/65   Pulse 75   Resp 16   Ht 5' 11 (1.803 m)   Wt 217 lb (98.4 kg)   SpO2 95%   BMI 30.27 kg/m  Wt Readings from Last 3 Encounters:  03/26/24 217 lb (98.4 kg)  01/15/24 227 lb 6.4 oz (103.1 kg)  01/07/24 229 lb 12.8 oz (104.2 kg)       Assessment & Plan:  Acute idiopathic gout, unspecified site Assessment & Plan: I recommend starting prednisone  40 mg once daily for 5 days. In addition, the following lifestyle changes are advised to support recovery and overall health: -Adequate hydration: Drink plenty of fluids unless otherwise restricted. -Rest: Prioritize rest and avoid strenuous activity until symptoms improve. -Nutrition: Maintain a balanced diet rich in fruits, vegetables, lean protein, and whole grains. -Monitor symptoms: Report any worsening or new symptoms, such as fever, shortness of breath, or side effects from prednisone  (e.g. mood changes, insomnia, or stomach upset). -Stress management: Engage in light activities like walking or meditation to reduce stress, which can support immune function  Orders: -     predniSONE ; Take 2 tablets (40 mg total) by mouth daily with breakfast for 5 days.  Dispense: 10 tablet; Refill: 0  Note: This chart has been completed using Engineer, civil (consulting) software, and while attempts have been made to ensure accuracy, certain words and phrases may not be transcribed as intended.    [place]  Rosalva Neary, FNP

## 2024-03-26 NOTE — Assessment & Plan Note (Signed)
 I recommend starting prednisone  40 mg once daily for 5 days. In addition, the following lifestyle changes are advised to support recovery and overall health: -Adequate hydration: Drink plenty of fluids unless otherwise restricted. -Rest: Prioritize rest and avoid strenuous activity until symptoms improve. -Nutrition: Maintain a balanced diet rich in fruits, vegetables, lean protein, and whole grains. -Monitor symptoms: Report any worsening or new symptoms, such as fever, shortness of breath, or side effects from prednisone  (e.g. mood changes, insomnia, or stomach upset). -Stress management: Engage in light activities like walking or meditation to reduce stress, which can support immune function

## 2024-03-26 NOTE — Patient Instructions (Addendum)
 I appreciate the opportunity to provide care to you today!   Gout flare ups  I recommend starting prednisone  40 mg once daily for 5 days. In addition, the following lifestyle changes are advised to support recovery and overall health: -Adequate hydration: Drink plenty of fluids unless otherwise restricted. -Rest: Prioritize rest and avoid strenuous activity until symptoms improve. -Nutrition: Maintain a balanced diet rich in fruits, vegetables, lean protein, and whole grains. -Monitor symptoms: Report any worsening or new symptoms, such as fever, shortness of breath, or side effects from prednisone  (e.g. mood changes, insomnia, or stomach upset). -Stress management: Engage in light activities like walking or meditation to reduce stress, which can support immune function.  Please follow up if your symptoms worsen or fail to improve.  Attached with your AVS, you will find valuable resources for self-education. I highly recommend dedicating some time to thoroughly examine them.   Please continue to a heart-healthy diet and increase your physical activities. Try to exercise for at least five days a week.    It was a pleasure to see you and I look forward to continuing to work together on your health and well-being. Please do not hesitate to call the office if you need care or have questions about your care.  In case of emergency, please visit the Emergency Department for urgent care, or contact our clinic at [phone] to schedule an appointment. We're here to help you!   Have a wonderful day and week. With Gratitude, Council Munguia MSN, FNP-BC

## 2024-04-11 DIAGNOSIS — M25562 Pain in left knee: Secondary | ICD-10-CM | POA: Diagnosis not present

## 2024-05-11 ENCOUNTER — Ambulatory Visit: Admitting: Internal Medicine

## 2024-05-11 ENCOUNTER — Encounter: Payer: Self-pay | Admitting: Internal Medicine

## 2024-05-11 VITALS — BP 122/65 | HR 71 | Ht 71.0 in | Wt 217.4 lb

## 2024-05-11 DIAGNOSIS — E559 Vitamin D deficiency, unspecified: Secondary | ICD-10-CM | POA: Diagnosis not present

## 2024-05-11 DIAGNOSIS — E782 Mixed hyperlipidemia: Secondary | ICD-10-CM

## 2024-05-11 DIAGNOSIS — Z125 Encounter for screening for malignant neoplasm of prostate: Secondary | ICD-10-CM

## 2024-05-11 DIAGNOSIS — Z7984 Long term (current) use of oral hypoglycemic drugs: Secondary | ICD-10-CM | POA: Diagnosis not present

## 2024-05-11 DIAGNOSIS — Z23 Encounter for immunization: Secondary | ICD-10-CM | POA: Diagnosis not present

## 2024-05-11 DIAGNOSIS — Z7901 Long term (current) use of anticoagulants: Secondary | ICD-10-CM

## 2024-05-11 DIAGNOSIS — M1A09X0 Idiopathic chronic gout, multiple sites, without tophus (tophi): Secondary | ICD-10-CM | POA: Diagnosis not present

## 2024-05-11 DIAGNOSIS — E1169 Type 2 diabetes mellitus with other specified complication: Secondary | ICD-10-CM

## 2024-05-11 DIAGNOSIS — I483 Typical atrial flutter: Secondary | ICD-10-CM

## 2024-05-11 DIAGNOSIS — Z0001 Encounter for general adult medical examination with abnormal findings: Secondary | ICD-10-CM | POA: Diagnosis not present

## 2024-05-11 DIAGNOSIS — I10 Essential (primary) hypertension: Secondary | ICD-10-CM

## 2024-05-11 MED ORDER — PREDNISONE 20 MG PO TABS: 40.0000 mg | ORAL_TABLET | Freq: Every day | ORAL | 1 refills | Status: DC

## 2024-05-11 NOTE — Assessment & Plan Note (Signed)
 Physical exam as documented. Counseling done  re healthy lifestyle involving commitment to 150 minutes exercise per week, heart healthy diet, and attaining healthy weight.The importance of adequate sleep also discussed. Immunization and cancer screening needs are specifically addressed at this visit.

## 2024-05-11 NOTE — Patient Instructions (Addendum)
 Please take Prednisone  as prescribed.  Please start taking chlorthalidone  half tablet every other day for now.  Please continue to take medications as prescribed.  Please continue to follow low carb diet and perform moderate exercise/walking as tolerated.

## 2024-05-11 NOTE — Assessment & Plan Note (Signed)
 During hospitalization in 2020 Was cardioverted On Eliquis  5 mg BID Had bradycardia with Cardizem  Followed by Cardiology

## 2024-05-11 NOTE — Assessment & Plan Note (Signed)
 Ordered PSA after discussing its limitations for prostate cancer screening, including false positive results leading to additional investigations.

## 2024-05-11 NOTE — Assessment & Plan Note (Addendum)
 BP Readings from Last 1 Encounters:  05/11/24 122/65   Well-controlled with amlodipine  10 mg QD now and losartan  50 mg QD Advised to take chlorthalidone  12.5 mg every other day for now considering recent dizzy spells -concern for orthostatic hypotension Counseled for compliance with the medications Advised DASH diet and moderate exercise/walking, at least 150 mins/week

## 2024-05-11 NOTE — Assessment & Plan Note (Addendum)
 Last vitamin D  Lab Results  Component Value Date   VD25OH 53.7 05/08/2023   Advised to take 5000 IU twice in a week

## 2024-05-11 NOTE — Assessment & Plan Note (Signed)
 Lab Results  Component Value Date   HGBA1C 6.6 (H) 01/28/2024   Well-controlled, improving since adding glipizide  at a lower dose Associated with HTN and HLD On Janumet  50-1000 mg BID and glipizide  2.5 mg QD Advised to follow diabetic diet On ACEi and statin now F/u CMP and lipid panel Diabetic eye exam: Advised to follow up with Ophthalmology for diabetic eye exam

## 2024-05-11 NOTE — Progress Notes (Signed)
 Established Patient Office Visit  Subjective:  Patient ID: Jeremy Burgess, male    DOB: [birth year]  Age: 58 y.o. MRN: [id number]  CC:  Chief Complaint  Patient presents with   Annual Exam    Cpe 4 month f/u    Edema    Has been having swelling in ankles , knees, wrists, and legs at times.     HPI Jeremy Burgess is a 58 y.o. 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  Type II DM: His HbA1c was 6.6 in 06/25.  He takes Janumet  50-1000 mg twice daily and glipizide  2.5 mg QD now.  He denies any polyuria or polydipsia or polyphagia. His blood glucose ranges around 100-150 mostly, which have improved since adding glipizide  at a lower dose.  He had hypoglycemia with glipizide  5 mg twice daily dosing in the past.  HTN: BP is wnl today. Takes amlodipine  10 mg QD and losartan  50 mg QD regularly. He takes chlorthalidone  12.5 mg every day as well, but has noticed intermittent dizziness especially with position changes since taking chlorthalidone  every day.  Patient denies headache, chest pain, dyspnea or palpitations.  He reports recurrent left knee and right ankle pain, with swelling.  He has been prescribed oral prednisone  by orthopedic surgery specialist considering gout, which had improved his symptoms.  He does not consume alcohol.  Past Medical History:  Diagnosis Date   Accidentally struck by falling tree 02/08/2015   Acute blood loss anemia 02/09/2015   Acute respiratory failure (HCC) 02/09/2015   Acute respiratory failure (HCC) 02/09/2015   Ankle fracture, right 02/09/2015   Arthritis    Asthma    Atrial flutter (HCC)    a. diagnosed in 08/2018 b. s/p DCCV in 09/2018 with return to NSR   Chronic pain due to trauma    Closed fracture of right ulna 02/08/2015   Closed right pilon fracture 02/23/2015   Diabetes mellitus     Diabetes mellitus (HCC)    Femur fracture, right (HCC) 02/08/2015   Heart murmur    History of blood clots    DVT   Hypertension    Injury of hand, extensor tendon, right, sequela 09/01/2018   Pneumothorax    Postlaminectomy syndrome, lumbar region 11/13/2015   Rib fracture    Thrombocytopenia 02/09/2015   Traumatic closed displaced fracture of multiple ribs of right side 02/08/2015   Traumatic hemopneumothorax 02/09/2015   Upper GI bleed 05/26/2016   Upper GI bleeding 05/27/2016   Trivial. EGD unremarkable.    Past Surgical History:  Procedure Laterality Date   BACK SURGERY     CARDIOVERSION N/A 10/06/2018   Procedure: CARDIOVERSION;  Surgeon: Debera Jayson MATSU, MD;  Location: [facility];  Service: Cardiovascular;  Laterality: N/A;   CHOLECYSTECTOMY N/A 10/30/2016   Procedure: LAPAROSCOPIC CHOLECYSTECTOMY;  Surgeon: Oneil Budge, MD;  Location: [facility];  Service: General;  Laterality: N/A;   COLONOSCOPY N/A 07/25/2016   Dr. Shaaron: 9 mm tubular adenoma. 5 year surveillance    ESOPHAGOGASTRODUODENOSCOPY  2009   EGD completed by Dr. Shaaron due to hematemesis. Findings of normal esophagus, tiny antral erosions, normal D1, D2   ESOPHAGOGASTRODUODENOSCOPY N/A 05/27/2016   Dr. Shaaron: normal    EXTERNAL FIXATION LEG Right 02/09/2015   Procedure: EXTERNAL FIXATION LEG;  Surgeon: Evalene JONETTA Chancy, MD;  Location: [facility]  OR;  Service: Orthopedics;  Laterality: Right;   EXTERNAL FIXATION REMOVAL Right 02/14/2015   Procedure: REMOVAL EXTERNAL FIXATION LEG;  Surgeon: Evalene JONETTA Chancy, MD;  Location: [facility] OR;  Service: Orthopedics;  Laterality: Right;   FEMUR IM NAIL Right 02/09/2015   Procedure: INTRAMEDULLARY (IM) RETROGRADE FEMORAL NAILING;  Surgeon: Evalene JONETTA Chancy, MD;  Location: [facility] OR;  Service: Orthopedics;  Laterality: Right;   NOSE SURGERY     ORIF ANKLE FRACTURE Right 02/14/2015   Procedure: OPEN REDUCTION INTERNAL FIXATION (ORIF) PILON FRACTURE;  Surgeon: Evalene JONETTA Chancy, MD;  Location: [facility] OR;   Service: Orthopedics;  Laterality: Right;   ORIF ULNAR FRACTURE Right 02/09/2015   Procedure: OPEN REDUCTION INTERNAL FIXATION (ORIF) ULNAR FRACTURE;  Surgeon: Evalene JONETTA Chancy, MD;  Location: [facility] OR;  Service: Orthopedics;  Laterality: Right;   POLYPECTOMY  07/25/2016   Procedure: POLYPECTOMY;  Surgeon: Lamar CHRISTELLA Hollingshead, MD;  Location: [facility];  Service: Endoscopy;;  colon   TOOTH EXTRACTION  04/2023    Family History  Problem Relation Age of Onset   Diabetes Mother    COPD Mother    CAD Father        CABG in his 3's   Diabetes Sister    Atrial fibrillation Brother    Colon cancer Neg Hx     Social History   Socioeconomic History   Marital status: Single    Spouse name: Not on file   Number of children: Not on file   Years of education: Not on file   Highest education level: Not on file  Occupational History   Not on file  Tobacco Use   Smoking status: Never   Smokeless tobacco: Never  Vaping Use   Vaping status: Never Used  Substance and Sexual Activity   Alcohol use: No    Comment: history of alcohol abuse but quit in 2008    Drug use: No   Sexual activity: Yes  Other Topics Concern   Not on file  Social History Narrative       Single.Lives alone.On disability secondary to tree fall accident 2016.   Social Drivers of Health   Financial Resource Strain: Medium Risk (06/27/2020)   Overall Financial Resource Strain (CARDIA)    Difficulty of Paying Living Expenses: Somewhat hard  Food Insecurity: Not on file  Transportation Needs: No Transportation Needs (08/30/2022)   PRAPARE - Administrator, Civil Service (Medical): No    Lack of Transportation (Non-Medical): No  Physical Activity: Not on file  Stress: Not on file  Social Connections: Not on file  Intimate Partner Violence: Not on file    Outpatient Medications Prior to Visit  Medication Sig Dispense Refill   amLODipine  (NORVASC ) 10 MG tablet Take 1 tablet (10 mg total) by mouth daily. 90  tablet 3   apixaban  (ELIQUIS ) 5 MG TABS tablet Take 1 tablet (5 mg total) by mouth 2 (two) times daily. 60 tablet 11   atorvastatin  (LIPITOR) 10 MG tablet Take 1 tablet (10 mg total) by mouth daily. 90 tablet 3   BD INSULIN  SYRINGE U/F 31G X 5/16 1 ML MISC USE AS DIRECTED 100 each 0   chlorthalidone  (HYGROTON ) 25 MG tablet Take 0.5 tablets (12.5 mg total) by mouth daily. 45 tablet 1   Cholecalciferol (VITAMIN D -3) 125 MCG (5000 UT) TABS Take 15,000 mg by mouth once a week.     cyclobenzaprine  (FLEXERIL ) 5 MG tablet Take 1 tablet (5 mg total) by mouth 2 (  two) times daily as needed. 30 tablet 0   glipiZIDE  (GLUCOTROL  XL) 2.5 MG 24 hr tablet Take 1 tablet (2.5 mg total) by mouth daily with breakfast. 90 tablet 3   glucose blood (ACCU-CHEK GUIDE) test strip USE 1 STRIP TO CHECK GLUCOSE TWICE DAILY 100 each 11   JANUMET  XR 50-1000 MG TB24 Take 2 tablets by mouth daily. 180 tablet 3   losartan  (COZAAR ) 50 MG tablet Take 1 tablet (50 mg total) by mouth daily. 90 tablet 3   MAGnesium -Oxide 400 (240 Mg) MG tablet TAKE 1 TABLET BY MOUTH TWICE DAILY FOR 5 DAYS THEN 1 DAILY 90 tablet 3   doxycycline  (VIBRAMYCIN ) 100 MG capsule Take 1 capsule (100 mg total) by mouth 2 (two) times daily. 20 capsule 0   gabapentin  (NEURONTIN ) 100 MG capsule Take 1 capsule (100 mg total) by mouth at bedtime. 90 capsule 1   oxyCODONE -acetaminophen  (PERCOCET) 5-325 MG tablet Take 1-2 tablets by mouth every 4 (four) hours as needed. 10 tablet 0   No facility-administered medications prior to visit.    Allergies  Allergen Reactions   Bee Venom Anaphylaxis    HONEY BEES   Lisinopril  Cough    ROS Review of Systems  Constitutional:  Negative for chills and fever.  HENT:  Negative for congestion and sore throat.   Eyes:  Negative for pain and discharge.  Respiratory:  Negative for cough and shortness of breath.   Cardiovascular:  Negative for chest pain and palpitations.  Gastrointestinal:  Negative for constipation,  diarrhea, nausea and vomiting.  Endocrine: Negative for polydipsia and polyuria.  Genitourinary:  Negative for dysuria and hematuria.  Musculoskeletal:  Positive for arthralgias and back pain. Negative for neck stiffness.  Skin:  Negative for rash.  Neurological:  Positive for numbness. Negative for dizziness, weakness and headaches.  Psychiatric/Behavioral:  Negative for agitation and behavioral problems.       Objective:    Physical Exam Vitals reviewed.  Constitutional:      General: He is not in acute distress.    Appearance: He is obese. He is not diaphoretic.  HENT:     Head: Normocephalic and atraumatic.     Nose: Congestion present.     Mouth/Throat:     Mouth: Mucous membranes are moist.  Eyes:     General: No scleral icterus.    Extraocular Movements: Extraocular movements intact.  Cardiovascular:     Rate and Rhythm: Normal rate and regular rhythm.     Heart sounds: Normal heart sounds. No murmur heard. Pulmonary:     Breath sounds: Normal breath sounds. No wheezing or rales.  Abdominal:     Palpations: Abdomen is soft.     Tenderness: There is no abdominal tenderness.  Musculoskeletal:     Right hand: Swelling and tenderness present.     Cervical back: Neck supple. No tenderness.     Lumbar back: Tenderness present. Normal range of motion.     Left knee: Swelling present. Normal range of motion. Tenderness present.     Right lower leg: Edema (1+) present.     Left lower leg: No edema.     Right ankle: Swelling present. Tenderness present.     Left ankle: No swelling. No tenderness.  Skin:    General: Skin is warm.     Findings: No rash.  Neurological:     General: No focal deficit present.     Mental Status: He is alert and oriented to person, place, and time.  Sensory: Sensory deficit (LUE, LLE) present.     Motor: No weakness.  Psychiatric:        Mood and Affect: Mood normal.        Behavior: Behavior normal.     BP 122/65   Pulse 71   Ht 5'  11 (1.803 m)   Wt 217 lb 6.4 oz (98.6 kg)   SpO2 96%   BMI 30.32 kg/m  Wt Readings from Last 3 Encounters:  05/11/24 217 lb 6.4 oz (98.6 kg)  03/26/24 217 lb (98.4 kg)  01/15/24 227 lb 6.4 oz (103.1 kg)    Lab Results  Component Value Date   TSH 3.140 05/08/2023   Lab Results  Component Value Date   WBC 18.4 (H) 01/28/2024   HGB 13.3 01/28/2024   HCT 40.0 01/28/2024   MCV 88 01/28/2024   PLT 308 01/28/2024   Lab Results  Component Value Date   NA 138 01/28/2024   K 4.7 01/28/2024   CO2 19 (L) 01/28/2024   GLUCOSE 130 (H) 01/28/2024   BUN 31 (H) 01/28/2024   CREATININE 1.41 (H) 01/28/2024   BILITOT 0.4 01/28/2024   ALKPHOS 84 01/28/2024   AST 15 01/28/2024   ALT 12 01/28/2024   PROT 7.5 01/28/2024   ALBUMIN  4.6 01/28/2024   CALCIUM  10.2 01/28/2024   ANIONGAP 7 01/20/2023   EGFR 57 (L) 01/28/2024   Lab Results  Component Value Date   CHOL 116 05/08/2023   Lab Results  Component Value Date   HDL 51 05/08/2023   Lab Results  Component Value Date   LDLCALC 49 05/08/2023   Lab Results  Component Value Date   TRIG 77 05/08/2023   Lab Results  Component Value Date   CHOLHDL 2.3 05/08/2023   Lab Results  Component Value Date   HGBA1C 6.6 (H) 01/28/2024      Assessment & Plan:   Problem List Items Addressed This Visit       Cardiovascular and Mediastinum   Essential hypertension   BP Readings from Last 1 Encounters:  05/11/24 122/65   Well-controlled with amlodipine  10 mg QD now and losartan  50 mg QD Advised to take chlorthalidone  12.5 mg every other day for now considering recent dizzy spells -concern for orthostatic hypotension Counseled for compliance with the medications Advised DASH diet and moderate exercise/walking, at least 150 mins/week      Relevant Orders   CBC with Differential/Platelet   CMP14+EGFR   TSH   Typical atrial flutter (HCC)   During hospitalization in 2020 Was cardioverted On Eliquis  5 mg BID Had bradycardia  with Cardizem  Followed by Cardiology      Relevant Orders   TSH     Endocrine   Type 2 diabetes mellitus with other specified complication (HCC)   Lab Results  Component Value Date   HGBA1C 6.6 (H) 01/28/2024   Well-controlled, improving since adding glipizide  at a lower dose Associated with HTN and HLD On Janumet  50-1000 mg BID and glipizide  2.5 mg QD Advised to follow diabetic diet On ACEi and statin now F/u CMP and lipid panel Diabetic eye exam: Advised to follow up with Ophthalmology for diabetic eye exam      Relevant Orders   Microalbumin / creatinine urine ratio   CMP14+EGFR   Hemoglobin A1c     Musculoskeletal and Integument   Chronic gout of multiple sites   Left knee swelling was thought to be likely due to gout, according to orthopedic surgery note Check uric  acid level Oral prednisone  40 mg QD x 5 days If elevated uric acid level, will start allopurinol Low purine diet material provided      Relevant Medications   predniSONE  (DELTASONE ) 20 MG tablet   Other Relevant Orders   Uric acid     Other   Vitamin D  deficiency   Last vitamin D  Lab Results  Component Value Date   VD25OH 53.7 05/08/2023   Advised to take 5000 IU twice in a week      Mixed hyperlipidemia   Checked lipid profile On statin      Relevant Orders   Lipid Profile   Prostate cancer screening   Ordered PSA after discussing its limitations for prostate cancer screening, including false positive results leading to additional investigations.      Encounter for general adult medical examination with abnormal findings - Primary   Physical exam as documented. Counseling done  re healthy lifestyle involving commitment to 150 minutes exercise per week, heart healthy diet, and attaining healthy weight.The importance of adequate sleep also discussed. Immunization and cancer screening needs are specifically addressed at this visit.      Other Visit Diagnoses       Chronic  anticoagulation         Encounter for immunization       Relevant Orders   Flu vaccine trivalent PF, 6mos and older(Flulaval,Afluria,Fluarix,Fluzone) (Completed)         Meds ordered this encounter  Medications   predniSONE  (DELTASONE ) 20 MG tablet    Sig: Take 2 tablets (40 mg total) by mouth daily with breakfast.    Dispense:  10 tablet    Refill:  1    Follow-up: Return in about 4 months (around 09/10/2024) for HTN and DM.    Suzzane MARLA Blanch, MD

## 2024-05-11 NOTE — Assessment & Plan Note (Signed)
 Left knee swelling was thought to be likely due to gout, according to orthopedic surgery note Check uric acid level Oral prednisone  40 mg QD x 5 days If elevated uric acid level, will start allopurinol Low purine diet material provided

## 2024-05-11 NOTE — Assessment & Plan Note (Signed)
 Checked lipid profile On statin

## 2024-05-12 ENCOUNTER — Ambulatory Visit: Payer: Self-pay | Admitting: Internal Medicine

## 2024-05-12 LAB — CBC WITH DIFFERENTIAL/PLATELET
Basophils Absolute: 0 x10E3/uL (ref 0.0–0.2)
Basos: 0 %
EOS (ABSOLUTE): 0 x10E3/uL (ref 0.0–0.4)
Eos: 0 %
Hematocrit: 36.5 % — ABNORMAL LOW (ref 37.5–51.0)
Hemoglobin: 12 g/dL — ABNORMAL LOW (ref 13.0–17.7)
Immature Grans (Abs): 0 x10E3/uL (ref 0.0–0.1)
Immature Granulocytes: 0 %
Lymphocytes Absolute: 1.4 x10E3/uL (ref 0.7–3.1)
Lymphs: 12 %
MCH: 28.8 pg (ref 26.6–33.0)
MCHC: 32.9 g/dL (ref 31.5–35.7)
MCV: 88 fL (ref 79–97)
Monocytes Absolute: 0.9 x10E3/uL (ref 0.1–0.9)
Monocytes: 8 %
Neutrophils Absolute: 9.2 x10E3/uL — ABNORMAL HIGH (ref 1.4–7.0)
Neutrophils: 80 %
Platelets: 339 x10E3/uL (ref 150–450)
RBC: 4.16 x10E6/uL (ref 4.14–5.80)
RDW: 13.5 % (ref 11.6–15.4)
WBC: 11.7 x10E3/uL — ABNORMAL HIGH (ref 3.4–10.8)

## 2024-05-12 LAB — CMP14+EGFR
ALT: 10 IU/L (ref 0–44)
AST: 16 IU/L (ref 0–40)
Albumin: 4.5 g/dL (ref 3.8–4.9)
Alkaline Phosphatase: 89 IU/L (ref 47–123)
BUN/Creatinine Ratio: 23 (ref 10–24)
BUN: 33 mg/dL — ABNORMAL HIGH (ref 8–27)
Bilirubin Total: 0.4 mg/dL (ref 0.0–1.2)
CO2: 21 mmol/L (ref 20–29)
Calcium: 10 mg/dL (ref 8.6–10.2)
Chloride: 100 mmol/L (ref 96–106)
Creatinine, Ser: 1.43 mg/dL — ABNORMAL HIGH (ref 0.76–1.27)
Globulin, Total: 2.7 g/dL (ref 1.5–4.5)
Glucose: 75 mg/dL (ref 70–99)
Potassium: 4.3 mmol/L (ref 3.5–5.2)
Sodium: 140 mmol/L (ref 134–144)
Total Protein: 7.2 g/dL (ref 6.0–8.5)
eGFR: 56 mL/min/1.73 — ABNORMAL LOW (ref >59)

## 2024-05-12 LAB — HEMOGLOBIN A1C
Est. average glucose Bld gHb Est-mCnc: 148 mg/dL
Hgb A1c MFr Bld: 6.8 % — ABNORMAL HIGH (ref 4.8–5.6)

## 2024-05-12 LAB — LIPID PANEL
Chol/HDL Ratio: 2.6 ratio (ref 0.0–5.0)
Cholesterol, Total: 120 mg/dL (ref 100–199)
HDL: 46 mg/dL (ref >39)
LDL Chol Calc (NIH): 58 mg/dL (ref 0–99)
Triglycerides: 82 mg/dL (ref 0–149)
VLDL Cholesterol Cal: 16 mg/dL (ref 5–40)

## 2024-05-12 LAB — URIC ACID: Uric Acid: 9.6 mg/dL — ABNORMAL HIGH (ref 3.8–8.4)

## 2024-05-12 LAB — TSH: TSH: 2.82 u[IU]/mL (ref 0.450–4.500)

## 2024-05-12 MED ORDER — ALLOPURINOL 100 MG PO TABS: 100.0000 mg | ORAL_TABLET | Freq: Every day | ORAL | 5 refills | Status: AC

## 2024-05-12 NOTE — Addendum Note (Signed)
 Addended byBETHA TOBIE DOWNS on: 05/12/2024 07:58 AM   Modules accepted: Orders

## 2024-05-13 LAB — MICROALBUMIN / CREATININE URINE RATIO
Creatinine, Urine: 241.6 mg/dL
Microalb/Creat Ratio: 6 mg/g{creat} (ref 0–29)
Microalbumin, Urine: 15.6 ug/mL

## 2024-05-14 ENCOUNTER — Encounter: Payer: Self-pay | Admitting: Student

## 2024-05-14 ENCOUNTER — Ambulatory Visit: Attending: Student | Admitting: Student

## 2024-05-14 VITALS — BP 122/64 | HR 69 | Ht 71.0 in | Wt 216.6 lb

## 2024-05-14 DIAGNOSIS — E782 Mixed hyperlipidemia: Secondary | ICD-10-CM | POA: Insufficient documentation

## 2024-05-14 DIAGNOSIS — I483 Typical atrial flutter: Secondary | ICD-10-CM | POA: Diagnosis not present

## 2024-05-14 DIAGNOSIS — Z7901 Long term (current) use of anticoagulants: Secondary | ICD-10-CM | POA: Diagnosis not present

## 2024-05-14 DIAGNOSIS — I10 Essential (primary) hypertension: Secondary | ICD-10-CM | POA: Insufficient documentation

## 2024-05-14 NOTE — Patient Instructions (Signed)
 Medication Instructions:   Continue current medications.   *If you need a refill on your cardiac medications before your next appointment, please call your pharmacy*  Follow-Up: At [facility], you and your health needs are our priority.  As part of our continuing mission to provide you with exceptional heart care, our providers are all part of one team.  This team includes your primary Cardiologist (physician) and Advanced Practice Providers or APPs (Physician Assistants and Nurse Practitioners) who all work together to provide you with the care you need, when you need it.  Your next appointment:   1 year(s)  Provider:   You may see Armida Lander, MD or one of the following Advanced Practice Providers on your designated Care Team:   Woodfin Hays, PA-C  [facility] [place], [place] Theotis Flake, [place]     We recommend signing up for the patient portal called "MyChart".  Sign up information is provided on this After Visit Summary.  MyChart is used to connect with patients for Virtual Visits (Telemedicine).  Patients are able to view lab/test results, encounter notes, upcoming appointments, etc.  Non-urgent messages can be sent to your provider as well.   To learn more about what you can do with MyChart, go to [email or web address].

## 2024-05-14 NOTE — Progress Notes (Signed)
 Cardiology Office Note    Date:  05/14/2024  ID:  Jeremy Burgess, DOB [birth year], MRN [id number] Cardiologist: Alvan Carrier, MD Cardiology APP:  Johnson Laymon HERO, PA-C { : History of Present Illness:    Jeremy Burgess is a 58 y.o. male with past medical history of persistent atrial flutter (s/p DCCV in 09/2018), HTN, Type II DM and history of provoked DVT who presents to the office today for annual follow-up.  He was last examined by Dr. Alvan in 04/2023 and denied any recent chest pain or palpitations at that time. He was maintaining normal sinus rhythm and he was continued on his current cardiac medications with Amlodipine  10 mg daily, Eliquis  5 mg twice daily, Atorvastatin  10 mg daily, Losartan  100 mg daily and Chlorthalidone  12.5 mg every other day.  In talking with the patient today, he reports overall doing well from a cardiac perspective since his last visit. He denies any recent chest pain or palpitations. No recent orthopnea or PND. Was previously having issues with lower extremity edema but now resolved. Has recently been experiencing gout and was prescribed a Prednisone  taper by his PCP and recently prescribed Allopurinol to help with symptoms. He remains on Eliquis  for anticoagulation with no reports of melena, hematochezia or hematuria.  Studies Reviewed:   EKG: EKG is ordered today and demonstrates:   EKG Interpretation Date/Time:  Friday May 14 2024 13:57:09 EDT Ventricular Rate:  68 PR Interval:  142 QRS Duration:  82 QT Interval:  402 QTC Calculation: 427 R Axis:   44  Text Interpretation: Normal sinus rhythm Normal ECG When compared with ECG of 20-Jan-2023 10:05, PREVIOUS ECG IS PRESENT Confirmed by Johnson Laymon ([id number]) on 05/14/2024 2:15:03 PM       Echocardiogram: 08/2018 Study Conclusions   - Left ventricle: The cavity size was normal. Wall thickness was    normal. Systolic function was normal. The estimated ejection    fraction was in the  range of 60% to 65%. Wall motion was normal;    there were no regional wall motion abnormalities. The study is    not technically sufficient to allow evaluation of LV diastolic    function.  - Aortic valve: Valve area (VTI): 2.83 cm^2. Valve area (Vmax):    2.61 cm^2.  - Left atrium: The atrium was moderately to severely dilated.  - Right atrium: The atrium was mildly dilated.  - Atrial septum: No defect or patent foramen ovale was identified.   NST: 05/2020 There was no ST segment deviation noted during stress. The study is normal. There are no perfusion defects This is a low risk study. The left ventricular ejection fraction is normal (55-65%).      Risk Assessment/Calculations:    CHA2DS2-VASc Score = 3  This indicates a 3.2% annual risk of stroke. The patient's score is based upon: CHF History: 0 HTN History: 1 Diabetes History: 1 Stroke History: 0 Vascular Disease History: 1 Age Score: 0 Gender Score: 0   Physical Exam:   VS:  BP 122/64 (BP Location: Left Arm, Cuff Size: Normal)   Pulse 69   Ht 5' 11 (1.803 m)   Wt 216 lb 9.6 oz (98.2 kg)   SpO2 96%   BMI 30.21 kg/m    Wt Readings from Last 3 Encounters:  05/14/24 216 lb 9.6 oz (98.2 kg)  05/11/24 217 lb 6.4 oz (98.6 kg)  03/26/24 217 lb (98.4 kg)     GEN: Well nourished, well developed male appearing in  no acute distress NECK: No JVD; No carotid bruits CARDIAC: RRR, no murmurs, rubs, gallops RESPIRATORY:  Clear to auscultation without rales, wheezing or rhonchi  ABDOMEN: Appears non-distended. No obvious abdominal masses. EXTREMITIES: No clubbing or cyanosis. No lower extremity edema.  Distal pedal pulses are 2+ bilaterally.   Assessment and Plan:   1. Typical atrial flutter/ Current use of long term anticoagulation - He was initially diagnosed with the arrhythmia in 08/2018 and underwent successful DCCV in 09/2018 with no known recurrence since. Denies any recent palpitations and in NSR by  examination and EKG today. No longer on AV nodal blocking agents.  - Continue Eliquis  5 mg twice daily for anticoagulation which is the appropriate dose given his current age (58 years old), weight (216 lbs) and renal function (creatinine at 1.43 when checked on 05/11/2024). CBC on 05/11/2024 showed his hemoglobin was stable at 12.0 with platelets at 339 K.   2. Essential hypertension - BP is well-controlled at 122/64 during today's visit. Continue current medical therapy with Amlodipine  10 mg daily, Chlorthalidone  12.5 mg daily and Losartan  50 mg daily. Creatinine was at 1.43 when checked in 04/2024 which is close to his known baseline.  3. Mixed hyperlipidemia - FLP in 04/2024 showed total cholesterol 120, triglycerides 82, HDL 46 and LDL 58. Continue current medical therapy with Atorvastatin  10 mg daily.   Signed, Laymon CHRISTELLA Qua, PA-C

## 2024-06-02 ENCOUNTER — Other Ambulatory Visit: Payer: Self-pay | Admitting: Internal Medicine

## 2024-06-02 DIAGNOSIS — E1169 Type 2 diabetes mellitus with other specified complication: Secondary | ICD-10-CM

## 2024-06-21 ENCOUNTER — Other Ambulatory Visit: Payer: Self-pay | Admitting: Internal Medicine

## 2024-06-21 DIAGNOSIS — I10 Essential (primary) hypertension: Secondary | ICD-10-CM

## 2024-07-12 ENCOUNTER — Ambulatory Visit: Payer: Self-pay

## 2024-07-12 ENCOUNTER — Emergency Department (HOSPITAL_COMMUNITY)
Admission: EM | Admit: 2024-07-12 | Discharge: 2024-07-12 | Disposition: A | Discharge status: Home / Self Care | Attending: Emergency Medicine | Admitting: Emergency Medicine

## 2024-07-12 ENCOUNTER — Encounter (HOSPITAL_COMMUNITY): Payer: Self-pay

## 2024-07-12 DIAGNOSIS — M109 Gout, unspecified: Secondary | ICD-10-CM | POA: Insufficient documentation

## 2024-07-12 DIAGNOSIS — M25522 Pain in left elbow: Secondary | ICD-10-CM | POA: Diagnosis present

## 2024-07-12 DIAGNOSIS — Z7901 Long term (current) use of anticoagulants: Secondary | ICD-10-CM | POA: Diagnosis not present

## 2024-07-12 DIAGNOSIS — M1009 Idiopathic gout, multiple sites: Secondary | ICD-10-CM | POA: Diagnosis not present

## 2024-07-12 MED ORDER — PREDNISONE 10 MG (21) PO TBPK: ORAL_TABLET | Freq: Every day | ORAL | 0 refills | Status: DC

## 2024-07-12 MED ORDER — OXYCODONE-ACETAMINOPHEN 5-325 MG PO TABS: 1.0000 | ORAL_TABLET | Freq: Once | ORAL | Status: DC

## 2024-07-12 MED ORDER — OXYCODONE HCL 5 MG PO TABS: 5.0000 mg | ORAL_TABLET | ORAL | 0 refills | Status: DC | PRN

## 2024-07-12 MED ORDER — PREDNISONE 50 MG PO TABS
60.0000 mg | ORAL_TABLET | Freq: Once | ORAL | Status: AC
  Administered 2024-07-12: 60 mg via ORAL
  Filled 2024-07-12: qty 1

## 2024-07-12 NOTE — ED Triage Notes (Signed)
 Patient brought in by POV with c/o left arm pain. Patient has a history of gout and states he was started on allopurinol  however his left elbow and left wrist and swollen and painful.

## 2024-07-12 NOTE — Discharge Instructions (Addendum)
 It was a pleasure meeting with you today.  I have sent a steroid taper and pain medication prescription to your pharmacy. Follow-up with your primary care for ongoing treatment of gout.

## 2024-07-12 NOTE — Telephone Encounter (Signed)
 FYI Only or Action Required?: Action required by provider: update on patient condition.  Patient was last seen in primary care on 05/11/2024 by Tobie Suzzane POUR, MD.  Called Nurse Triage reporting Gout.  Triage Disposition: See HCP Within 4 Hours (Or PCP Triage)  Patient/caregiver understands and will follow disposition?: No, wishes to speak with PCP         Copied from CRM #[id number]. Topic: Clinical - Medication Question >> Jul 12, 2024  2:37 PM Emylou G wrote: Reason for CRM: Patient adv allopurinol  (ZYLOPRIM ) 100 MG tablet didn't work for his gout.. his arm and elbow isn't working well - he is requesting predniSONE  (DELTASONE ) 20 MG tablet refill to be refilled.. he is using [facility]. Reason for Disposition  [1] SEVERE pain (e.g., excruciating, unable to use hand at all) AND [2] not improved after 2 hours of pain medicine  Answer Assessment - Initial Assessment Questions First available appt with PCP is 12/24. Pt would like a call back at [phone].Pt is requesting a refill of prednisone  sent to [facility] pharmacy. This RN educated pt on new-worsening symptoms and when to call back/seek emergent care. Pt verbalized understanding.   Left hand and elbow pain constant 10/10 pain level Pt states the allopurinol  is not working. Pt has been taking it since 05/11/2024  Tingling in fingers from pain Swelling in left wrist and elbow  Protocols used: Hand Pain-A-AH

## 2024-07-12 NOTE — ED Provider Notes (Signed)
 [place] [facility] DEPARTMENT AT [facility] Provider Note   CSN: [id number] Arrival date & time: 07/12/24  8046     Patient presents with: Extremity Pain   Jeremy Burgess is a 58 y.o. male.   Patient is here for evaluation of swelling and pain to the left elbow, left wrist, and left hand including fingers.  Patient has a history of gout and states that the feeling is the same as he has had before with gout flares.  He denies any recent injuries to the left upper extremity.  The pain and swelling have been getting progressively worse over the last 3 days.  He denies any recent fevers.  He was started on allopurinol  by his primary care physician in October of this year.  He denies missing any of these doses.  He has a history of paroxysmal A-fib, taking Eliquis .  The history is provided by the patient.  Extremity Pain This is a recurrent problem.      Prior to Admission medications   Medication Sig Start Date End Date Taking? Authorizing Provider  oxyCODONE  (ROXICODONE ) 5 MG immediate release tablet Take 1 tablet (5 mg total) by mouth every 4 (four) hours as needed for up to 3 days for severe pain (pain score 7-10). 07/12/24 07/15/24 Yes Rosina Almarie LABOR, PA-C  predniSONE  (STERAPRED UNI-PAK 21 TAB) 10 MG (21) TBPK tablet Take by mouth daily. Take 6 tabs by mouth daily  for 2 days, then 5 tabs for 2 days, then 4 tabs for 2 days, then 3 tabs for 2 days, 2 tabs for 2 days, then 1 tab by mouth daily for 2 days 07/12/24  Yes Rosina Almarie A, PA-C  ACCU-CHEK GUIDE TEST test strip USE 1 STRIP TO CHECK GLUCOSE TWICE DAILY 06/02/24   Tobie Suzzane POUR, MD  allopurinol  (ZYLOPRIM ) 100 MG tablet Take 1 tablet (100 mg total) by mouth daily. 05/12/24   Tobie Suzzane POUR, MD  amLODipine  (NORVASC ) 10 MG tablet Take 1 tablet (10 mg total) by mouth daily. 01/07/24   Tobie Suzzane POUR, MD  apixaban  (ELIQUIS ) 5 MG TABS tablet Take 1 tablet (5 mg total) by mouth 2 (two) times daily. 12/18/23   Tobie Suzzane POUR, MD  atorvastatin  (LIPITOR) 10 MG tablet Take 1 tablet (10 mg total) by mouth daily. 01/07/24   Tobie Suzzane POUR, MD  BD INSULIN  SYRINGE U/F 31G X 5/16 1 ML MISC USE AS DIRECTED 12/03/22   Tobie Suzzane POUR, MD  chlorthalidone  (HYGROTON ) 25 MG tablet Take 0.5 tablets (12.5 mg total) by mouth daily. 01/07/24   Tobie Suzzane POUR, MD  Cholecalciferol (VITAMIN D -3) 125 MCG (5000 UT) TABS Take 15,000 mg by mouth once a week.    [provider]  glipiZIDE  (GLUCOTROL  XL) 2.5 MG 24 hr tablet Take 1 tablet (2.5 mg total) by mouth daily with breakfast. 01/07/24   Tobie Suzzane POUR, MD  JANUMET  XR 50-1000 MG TB24 Take 2 tablets by mouth daily. 01/07/24   Tobie Suzzane POUR, MD  losartan  (COZAAR ) 50 MG tablet Take 1 tablet (50 mg total) by mouth daily. 01/07/24   Tobie Suzzane POUR, MD  MAGnesium -Oxide 400 (240 Mg) MG tablet TAKE 1 TABLET BY MOUTH TWICE DAILY FOR 5 DAYS THEN 1 DAILY 10/14/23   Branch, Dorn FALCON, MD    Allergies: Bee venom and Lisinopril     Review of Systems  Musculoskeletal:  Positive for joint swelling.    Updated Vital Signs BP (!) 140/68 (BP Location: Right Arm)  Pulse 82   Temp 99.1 F (37.3 C) (Oral)   Resp 19   Ht 5' 11 (1.803 m)   Wt 97.5 kg   SpO2 98%   BMI 29.99 kg/m   Physical Exam Vitals and nursing note reviewed.  Constitutional:      General: He is not in acute distress.    Appearance: Normal appearance. He is obese. He is not ill-appearing, toxic-appearing or diaphoretic.  HENT:     Head: Normocephalic and atraumatic.     Mouth/Throat:     Mouth: Mucous membranes are moist.  Eyes:     General: No scleral icterus.    Extraocular Movements: Extraocular movements intact.     Conjunctiva/sclera: Conjunctivae normal.  Cardiovascular:     Rate and Rhythm: Normal rate and regular rhythm.     Pulses: Normal pulses.     Heart sounds: Normal heart sounds.  Pulmonary:     Effort: Pulmonary effort is normal. No respiratory distress.     Breath sounds: Normal  breath sounds. No stridor. No wheezing, rhonchi or rales.  Musculoskeletal:        General: Swelling and tenderness present. No deformity or signs of injury.     Right lower leg: No edema.     Left lower leg: No edema.     Comments: Swelling and tenderness to the left elbow, left wrist, and left hand.  Limited range of mobility of left hand, left wrist, and left elbow.  Skin:    General: Skin is warm and dry.     Capillary Refill: Capillary refill takes less than 2 seconds.     Coloration: Skin is not jaundiced or pale.  Neurological:     Mental Status: He is alert and oriented to person, place, and time.     (all labs ordered are listed, but only abnormal results are displayed) Labs Reviewed - No data to display  EKG: None  Radiology: No results found.   Procedures   Medications Ordered in the ED  predniSONE  (DELTASONE ) tablet 60 mg (has no administration in time range)     Patient presents to the ED for concern of left upper extremity swelling, this involves an extensive number of treatment options, and is a complaint that carries with it a high risk of complications and morbidity.  The differential diagnosis includes cellulitis, septic arthritis, gout, DVT   Co morbidities that complicate the patient evaluation  History of gout Paroxysmal afib, on eliquis    Additional history obtained:  Additional history obtained from *** {Blank multiple:19196::EMS,Family,Nursing,Outside Medical Records,Past Admission}   External records from outside source obtained and reviewed including ***   Lab Tests:  I Ordered, and personally interpreted labs.  The pertinent results include:  ***   Imaging Studies ordered:  I ordered imaging studies including ***  I independently visualized and interpreted imaging which showed *** I agree with the radiologist interpretation   Cardiac Monitoring:  The patient was maintained on a cardiac monitor.  I personally viewed and  interpreted the cardiac monitored which showed an underlying rhythm of: ***   Medicines ordered and prescription drug management:  I ordered medication including ***  for ***  Reevaluation of the patient after these medicines showed that the patient {resolved/improved/worsened:23923::improved} I have reviewed the patients home medicines and have made adjustments as needed   Test Considered:  ***   Critical Interventions:  ***   Consultations Obtained:  I requested consultation with the ***,  and discussed lab and imaging findings  as well as pertinent plan - they recommend: ***   Problem List / ED Course:  ***   Reevaluation:  After the interventions noted above, I reevaluated the patient and found that they have :{resolved/improved/worsened:23923::improved}   Social Determinants of Health:  ***   Dispostion:  After consideration of the diagnostic results and the patients response to treatment, I feel that the patent would benefit from ***.   Medical Decision Making    Final diagnoses:  Acute gout of multiple sites, unspecified cause    ED Discharge Orders          Ordered    predniSONE  (STERAPRED UNI-PAK 21 TAB) 10 MG (21) TBPK tablet  Daily        07/12/24 2311    oxyCODONE  (ROXICODONE ) 5 MG immediate release tablet  Every 4 hours PRN        07/12/24 2314

## 2024-07-12 NOTE — ED Notes (Signed)
 Patient gave clinical research associate the permission to sign x for MSE.

## 2024-07-13 ENCOUNTER — Telehealth (HOSPITAL_COMMUNITY): Payer: Self-pay | Admitting: Emergency Medicine

## 2024-07-13 MED ORDER — OXYCODONE HCL 5 MG PO TABS: 5.0000 mg | ORAL_TABLET | Freq: Four times a day (QID) | ORAL | 0 refills | Status: AC | PRN

## 2024-07-13 NOTE — Telephone Encounter (Signed)
 Patient needs to have pain med re-sent to pharmacy. We have contacted pharmacy to confirm.

## 2024-07-23 ENCOUNTER — Other Ambulatory Visit: Payer: Self-pay | Admitting: Internal Medicine

## 2024-07-23 ENCOUNTER — Telehealth: Payer: Self-pay | Admitting: Internal Medicine

## 2024-07-23 DIAGNOSIS — J019 Acute sinusitis, unspecified: Secondary | ICD-10-CM

## 2024-07-23 MED ORDER — AMOXICILLIN-POT CLAVULANATE 875-125 MG PO TABS: 1.0000 | ORAL_TABLET | Freq: Two times a day (BID) | ORAL | 0 refills | Status: AC

## 2024-07-23 MED ORDER — GUAIFENESIN-CODEINE 100-10 MG/5ML PO SOLN: 5.0000 mL | Freq: Three times a day (TID) | ORAL | 0 refills | Status: AC | PRN

## 2024-07-23 NOTE — Telephone Encounter (Signed)
 Appt scheduled

## 2024-07-23 NOTE — Telephone Encounter (Signed)
 Copied from CRM #[id number]. Topic: General - Other >> Jul 23, 2024 12:55 PM Zebedee SAUNDERS wrote: Reason for CRM: Pt would like antibiotics and cough medication called to  [facility] [phone] [place] [address] Phone: [phone] Fax: [phone], since he can't be seen for a few weeks and does not want to drive to [employer]. Please call pt at [phone]

## 2024-07-26 ENCOUNTER — Encounter: Payer: Self-pay | Admitting: Internal Medicine

## 2024-07-26 ENCOUNTER — Ambulatory Visit: Payer: Self-pay | Admitting: Internal Medicine

## 2024-07-26 VITALS — BP 134/70 | HR 78 | Ht 71.0 in | Wt 220.0 lb

## 2024-07-26 DIAGNOSIS — E1169 Type 2 diabetes mellitus with other specified complication: Secondary | ICD-10-CM | POA: Diagnosis not present

## 2024-07-26 DIAGNOSIS — Z794 Long term (current) use of insulin: Secondary | ICD-10-CM

## 2024-07-26 DIAGNOSIS — J209 Acute bronchitis, unspecified: Secondary | ICD-10-CM | POA: Diagnosis not present

## 2024-07-26 DIAGNOSIS — M1A09X0 Idiopathic chronic gout, multiple sites, without tophus (tophi): Secondary | ICD-10-CM | POA: Diagnosis not present

## 2024-07-26 MED ORDER — COLCHICINE 0.6 MG PO TABS: ORAL_TABLET | ORAL | 1 refills | Status: AC

## 2024-07-26 NOTE — Patient Instructions (Signed)
 Please continue taking Augmentin  as prescribed.  Please take Colchicine  as prescribed for gout flareup. Please continue taking Allopurinol  regularly.

## 2024-07-26 NOTE — Assessment & Plan Note (Signed)
 Started empiric Augmentin  considering persistent symptoms despite symptomatic treatment Cheratussin as needed for cough Since his symptoms are improving now, would avoid oral steroids considering his history of type II DM

## 2024-07-26 NOTE — Assessment & Plan Note (Signed)
 On allopurinol  100 mg QD, recently started in 10/25 Recently had ER visit for gout attack, was given oral steroids Colchicine  prescribed for gout flareup Check uric acid level after 6 weeks - if elevated uric acid level, will increase dose of allopurinol  Low purine diet material provided

## 2024-07-26 NOTE — Progress Notes (Signed)
 Acute Office Visit  Subjective:    Patient ID: Jeremy Burgess, male    DOB: 05/18/1966, 58 y.o.   MRN: [id number]  Chief Complaint  Patient presents with   Cough    Cough with green/yellow mucus    HPI Patient is in today for complaint of cough with greenish-yellow expectoration and mild chest tightness for the last 2 weeks.  Denies any fever or chills.  He went to ER on 07/12/24 for a gout flareup, after which he started having cough, sore throat and chest tightness.  He has noticed improvement with guaifenesin -codeine  syrup.  Past Medical History:  Diagnosis Date   Accidentally struck by falling tree 02/08/2015   Acute blood loss anemia 02/09/2015   Acute respiratory failure (HCC) 02/09/2015   Acute respiratory failure (HCC) 02/09/2015   Ankle fracture, right 02/09/2015   Arthritis    Asthma    Atrial flutter (HCC)    a. diagnosed in 08/2018 b. s/p DCCV in 09/2018 with return to [facility]   Chronic pain due to trauma    Closed fracture of right ulna 02/08/2015   Closed right pilon fracture 02/23/2015   Diabetes mellitus    Diabetes mellitus (HCC)    Femur fracture, right (HCC) 02/08/2015   Gout    Heart murmur    History of blood clots    DVT   Hypertension    Injury of hand, extensor tendon, right, sequela 09/01/2018   Pneumothorax    Postlaminectomy syndrome, lumbar region 11/13/2015   Rib fracture    Thrombocytopenia 02/09/2015   Traumatic closed displaced fracture of multiple ribs of right side 02/08/2015   Traumatic hemopneumothorax 02/09/2015   Upper GI bleed 05/26/2016   Upper GI bleeding 05/27/2016   Trivial. EGD unremarkable.    Past Surgical History:  Procedure Laterality Date   BACK SURGERY     CARDIOVERSION N/A 10/06/2018   Procedure: CARDIOVERSION;  Surgeon: Debera Jayson MATSU, MD;  Location: [facility];  Service: Cardiovascular;  Laterality: N/A;   CHOLECYSTECTOMY N/A 10/30/2016   Procedure: LAPAROSCOPIC CHOLECYSTECTOMY;  Surgeon: Oneil Budge, MD;   Location: [facility];  Service: General;  Laterality: N/A;   COLONOSCOPY N/A 07/25/2016   Dr. Shaaron: 9 mm tubular adenoma. 5 year surveillance    ESOPHAGOGASTRODUODENOSCOPY  2009   EGD completed by Dr. Shaaron due to hematemesis. Findings of normal esophagus, tiny antral erosions, normal D1, D2   ESOPHAGOGASTRODUODENOSCOPY N/A 05/27/2016   Dr. Shaaron: normal    EXTERNAL FIXATION LEG Right 02/09/2015   Procedure: EXTERNAL FIXATION LEG;  Surgeon: Evalene JONETTA Chancy, MD;  Location: [facility] OR;  Service: Orthopedics;  Laterality: Right;   EXTERNAL FIXATION REMOVAL Right 02/14/2015   Procedure: REMOVAL EXTERNAL FIXATION LEG;  Surgeon: Evalene JONETTA Chancy, MD;  Location: [facility] OR;  Service: Orthopedics;  Laterality: Right;   FEMUR IM NAIL Right 02/09/2015   Procedure: INTRAMEDULLARY (IM) RETROGRADE FEMORAL NAILING;  Surgeon: Evalene JONETTA Chancy, MD;  Location: [facility] OR;  Service: Orthopedics;  Laterality: Right;   NOSE SURGERY     ORIF ANKLE FRACTURE Right 02/14/2015   Procedure: OPEN REDUCTION INTERNAL FIXATION (ORIF) PILON FRACTURE;  Surgeon: Evalene JONETTA Chancy, MD;  Location: [facility] OR;  Service: Orthopedics;  Laterality: Right;   ORIF ULNAR FRACTURE Right 02/09/2015   Procedure: OPEN REDUCTION INTERNAL FIXATION (ORIF) ULNAR FRACTURE;  Surgeon: Evalene JONETTA Chancy, MD;  Location: [facility] OR;  Service: Orthopedics;  Laterality: Right;   POLYPECTOMY  07/25/2016   Procedure: POLYPECTOMY;  Surgeon: Lamar CHRISTELLA Shaaron, MD;  Location: [facility] SU[facility];  Service: Endoscopy;;  colon   TOOTH EXTRACTION  04/2023    Family History  Problem Relation Age of Onset   Diabetes Mother    COPD Mother    CAD Father        CABG in his 10's   Diabetes Sister    Atrial fibrillation Brother    Colon cancer Neg Hx     Social History   Socioeconomic History   Marital status: Single    Spouse name: Not on file   Number of children: Not on file   Years of education: Not on file   Highest education level: Not on file  Occupational History   Not on  file  Tobacco Use   Smoking status: Never   Smokeless tobacco: Never  Vaping Use   Vaping status: Never Used  Substance and Sexual Activity   Alcohol use: No    Comment: history of alcohol abuse but quit in 2008    Drug use: No   Sexual activity: Yes  Other Topics Concern   Not on file  Social History Narrative       Single.Lives alone.On disability secondary to tree fall accident 2016.   Social Drivers of Health   Tobacco Use: Low Risk (07/26/2024)   Patient History    Smoking Tobacco Use: Never    Smokeless Tobacco Use: Never    Passive Exposure: Not on file  Financial Resource Strain: Not on file  Food Insecurity: Not on file  Transportation Needs: No Transportation Needs (08/30/2022)   PRAPARE - Administrator, Civil Service (Medical): No    Lack of Transportation (Non-Medical): No  Physical Activity: Not on file  Stress: Not on file  Social Connections: Not on file  Intimate Partner Violence: Not on file  Depression (PHQ2-9): Low Risk (05/11/2024)   Depression (PHQ2-9)    PHQ-2 Score: 0  Alcohol Screen: Not on file  Housing: Not on file  Utilities: Not on file  Health Literacy: Not on file    Outpatient Medications Prior to Visit  Medication Sig Dispense Refill   ACCU-CHEK GUIDE TEST test strip USE 1 STRIP TO CHECK GLUCOSE TWICE DAILY 100 each 0   allopurinol  (ZYLOPRIM ) 100 MG tablet Take 1 tablet (100 mg total) by mouth daily. 30 tablet 5   amLODipine  (NORVASC ) 10 MG tablet Take 1 tablet (10 mg total) by mouth daily. 90 tablet 3   amoxicillin -clavulanate (AUGMENTIN ) 875-125 MG tablet Take 1 tablet by mouth 2 (two) times daily. 14 tablet 0   apixaban  (ELIQUIS ) 5 MG TABS tablet Take 1 tablet (5 mg total) by mouth 2 (two) times daily. 60 tablet 11   atorvastatin  (LIPITOR) 10 MG tablet Take 1 tablet (10 mg total) by mouth daily. 90 tablet 3   BD INSULIN  SYRINGE U/F 31G X 5/16 1 ML MISC USE AS DIRECTED 100 each 0   chlorthalidone  (HYGROTON ) 25 MG tablet  Take 0.5 tablets (12.5 mg total) by mouth daily. 45 tablet 1   Cholecalciferol (VITAMIN D -3) 125 MCG (5000 UT) TABS Take 15,000 mg by mouth once a week.     glipiZIDE  (GLUCOTROL  XL) 2.5 MG 24 hr tablet Take 1 tablet (2.5 mg total) by mouth daily with breakfast. 90 tablet 3   guaiFENesin -codeine  100-10 MG/5ML syrup Take 5 mLs by mouth 3 (three) times daily as needed for cough. 120 mL 0   JANUMET  XR 50-1000 MG TB24 Take 2 tablets by mouth daily. 180 tablet 3  losartan  (COZAAR ) 50 MG tablet Take 1 tablet (50 mg total) by mouth daily. 90 tablet 3   MAGnesium -Oxide 400 (240 Mg) MG tablet TAKE 1 TABLET BY MOUTH TWICE DAILY FOR 5 DAYS THEN 1 DAILY 90 tablet 3   predniSONE  (STERAPRED UNI-PAK 21 TAB) 10 MG (21) TBPK tablet Take by mouth daily. Take 6 tabs by mouth daily  for 2 days, then 5 tabs for 2 days, then 4 tabs for 2 days, then 3 tabs for 2 days, 2 tabs for 2 days, then 1 tab by mouth daily for 2 days 42 tablet 0   No facility-administered medications prior to visit.    Allergies[1]  Review of Systems  Constitutional:  Negative for chills and fever.  HENT:  Positive for sore throat. Negative for congestion.   Eyes:  Negative for pain and discharge.  Respiratory:  Positive for cough and chest tightness. Negative for shortness of breath.   Cardiovascular:  Negative for chest pain and palpitations.  Gastrointestinal:  Negative for constipation, diarrhea, nausea and vomiting.  Endocrine: Negative for polydipsia and polyuria.  Genitourinary:  Negative for dysuria and hematuria.  Musculoskeletal:  Positive for arthralgias and back pain. Negative for neck stiffness.  Skin:  Negative for rash.  Neurological:  Positive for numbness. Negative for dizziness, weakness and headaches.  Psychiatric/Behavioral:  Negative for agitation and behavioral problems.        Objective:    Physical Exam Vitals reviewed.  Constitutional:      General: He is not in acute distress.    Appearance: He is obese.  He is not diaphoretic.  HENT:     Head: Normocephalic and atraumatic.     Nose: Congestion present.     Mouth/Throat:     Mouth: Mucous membranes are moist.  Eyes:     General: No scleral icterus.    Extraocular Movements: Extraocular movements intact.  Cardiovascular:     Rate and Rhythm: Normal rate and regular rhythm.     Heart sounds: Normal heart sounds. No murmur heard. Pulmonary:     Breath sounds: No wheezing or rales.  Musculoskeletal:     Right hand: Swelling and tenderness present.     Cervical back: Neck supple. No tenderness.     Lumbar back: Tenderness present. Normal range of motion.     Left knee: Swelling present. Normal range of motion. Tenderness present.     Right lower leg: Edema (1+) present.     Left lower leg: No edema.     Right ankle: Swelling present. Tenderness present.     Left ankle: No swelling. No tenderness.  Skin:    General: Skin is warm.     Findings: No rash.  Neurological:     General: No focal deficit present.     Mental Status: He is alert and oriented to person, place, and time.     Sensory: Sensory deficit (LUE, LLE) present.     Motor: No weakness.  Psychiatric:        Mood and Affect: Mood normal.        Behavior: Behavior normal.     BP 134/70   Pulse 78   Ht 5' 11 (1.803 m)   Wt 220 lb (99.8 kg)   SpO2 98%   BMI 30.68 kg/m  Wt Readings from Last 3 Encounters:  07/26/24 220 lb (99.8 kg)  07/12/24 215 lb (97.5 kg)  05/14/24 216 lb 9.6 oz (98.2 kg)        Assessment &  Plan:   Problem List Items Addressed This Visit       Respiratory   Acute bronchitis - Primary   Started empiric Augmentin  considering persistent symptoms despite symptomatic treatment Cheratussin as needed for cough Since his symptoms are improving now, would avoid oral steroids considering his history of type II DM        Endocrine   Type 2 diabetes mellitus with other specified complication (HCC)   Lab Results  Component Value Date    HGBA1C 6.8 (H) 05/11/2024   Well-controlled, improving since adding glipizide  at a lower dose Associated with HTN and HLD On Janumet  50-1000 mg BID and glipizide  2.5 mg QD Advised to follow diabetic diet On ACEi and statin now F/u CMP and lipid panel Diabetic eye exam: Advised to follow up with Ophthalmology for diabetic eye exam      Relevant Orders   CMP14+EGFR   Hemoglobin A1c     Musculoskeletal and Integument   Chronic gout of multiple sites   On allopurinol  100 mg QD, recently started in 10/25 Recently had ER visit for gout attack, was given oral steroids Colchicine  prescribed for gout flareup Check uric acid level after 6 weeks - if elevated uric acid level, will increase dose of allopurinol  Low purine diet material provided      Relevant Medications   colchicine  0.6 MG tablet   Other Relevant Orders   Uric acid     Meds ordered this encounter  Medications   colchicine  0.6 MG tablet    Sig: Please take 2 tablets by mouth once, followed by 1 tablet 1 hour later.  Please take 1 tablet once daily from day 2.    Dispense:  20 tablet    Refill:  1     Abbye Lao MARLA Blanch, MD     [1]  Allergies Allergen Reactions   Bee Venom Anaphylaxis    HONEY BEES   Lisinopril  Cough

## 2024-07-26 NOTE — Assessment & Plan Note (Signed)
 Lab Results  Component Value Date   HGBA1C 6.8 (H) 05/11/2024   Well-controlled, improving since adding glipizide  at a lower dose Associated with HTN and HLD On Janumet  50-1000 mg BID and glipizide  2.5 mg QD Advised to follow diabetic diet On ACEi and statin now F/u CMP and lipid panel Diabetic eye exam: Advised to follow up with Ophthalmology for diabetic eye exam

## 2024-09-10 ENCOUNTER — Ambulatory Visit: Admitting: Internal Medicine

## 2024-09-28 ENCOUNTER — Ambulatory Visit: Payer: Self-pay | Admitting: Internal Medicine
